# Patient Record
Sex: Male | Born: 1960 | Race: White | Hispanic: No | Marital: Single
Health system: Midwestern US, Community
[De-identification: ages and names within clinical notes are randomized; demographics above are authoritative.]

## PROBLEM LIST (undated history)

## (undated) DIAGNOSIS — J690 Pneumonitis due to inhalation of food and vomit: Secondary | ICD-10-CM

## (undated) DIAGNOSIS — Z800 Family history of malignant neoplasm of digestive organs: Secondary | ICD-10-CM

## (undated) HISTORY — DX: Family history of malignant neoplasm of digestive organs: Z80.0

## (undated) MED ORDER — LISINOPRIL 10 MG OR TABS
ORAL_TABLET | ORAL | Status: AC
Start: 2013-04-02 — End: ?

## (undated) MED ORDER — HYDROCHLOROTHIAZIDE 25 MG OR TABS
ORAL_TABLET | ORAL | Status: DC
Start: 2001-06-02 — End: 2001-12-25

## (undated) MED ORDER — ZESTRIL 40 MG OR TABS
ORAL_TABLET | ORAL | Status: DC
Start: 2001-06-02 — End: 2003-01-04

## (undated) MED ORDER — CIPROFLOXACIN TABS 500 MG OR
ORAL_TABLET | ORAL | Status: DC
Start: 2001-09-08 — End: 2001-10-09

## (undated) MED ORDER — ROBITUSSIN A-C 10-100 MG/5ML OR SYRP
ORAL_SOLUTION | ORAL | Status: AC
Start: 2001-12-19 — End: 2001-12-24

## (undated) MED ORDER — LISINOPRIL 40 MG OR TABS
ORAL_TABLET | ORAL | Status: AC
Start: 2016-01-30 — End: ?

## (undated) MED ORDER — LISINOPRIL 40 MG OR TABS
ORAL_TABLET | ORAL | Status: AC
Start: 2015-10-16 — End: ?

## (undated) MED ORDER — AMOXICILLIN 500 MG OR CAPS
ORAL_CAPSULE | ORAL | Status: AC
Start: 2001-12-19 — End: 2001-12-28

## (undated) MED ORDER — NAPROXEN 500 MG OR TABS
ORAL_TABLET | ORAL | Status: DC
Start: 2001-09-08 — End: 2003-01-04

## (undated) MED ORDER — VENTOLIN 90 MCG/ACT IN AERS
INHALATION_SPRAY | RESPIRATORY_TRACT | Status: DC
Start: 2001-12-19 — End: 2003-01-04

## (undated) MED ORDER — ZESTRIL 40 MG OR TABS
ORAL_TABLET | ORAL | Status: DC
Start: 2001-02-16 — End: 2001-06-02

## (undated) DEATH — deceased

---

## 1994-12-13 DIAGNOSIS — I10 Essential (primary) hypertension: Secondary | ICD-10-CM

## 1994-12-13 HISTORY — DX: Essential (primary) hypertension: I10

## 2001-01-06 ENCOUNTER — Ambulatory Visit (INDEPENDENT_AMBULATORY_CARE_PROVIDER_SITE_OTHER): Payer: No Typology Code available for payment source | Admitting: Internal Medicine

## 2001-01-06 ENCOUNTER — Encounter (INDEPENDENT_AMBULATORY_CARE_PROVIDER_SITE_OTHER): Payer: Self-pay | Admitting: Internal Medicine

## 2001-01-06 VITALS — BP 146/92 | HR 80 | Ht 68.0 in | Wt 220.0 lb

## 2001-01-06 DIAGNOSIS — S335XXA Sprain of ligaments of lumbar spine, initial encounter: Secondary | ICD-10-CM

## 2001-01-06 NOTE — Nursing Note (Signed)
>>   Roger [name], Roger [name]     01/06/2001   1:52 pm  New pt here after falling at work earlier today.

## 2001-01-06 NOTE — Progress Notes (Addendum)
Addended by: Hadassah Pais A on: 01/20/2001,10:42 AM  Modules accepted: Order Summary, Progress Notes    Addended by: Hadassah Pais A on: 01/06/2001,7:39 PM  Modules accepted: Order Summary, Progress Notes    Roger [name] is a 40 year old male who presents for the following concern:    Here for work injury which occurred earlier today. Works at the [employer], was trying to secure a sailboat and slipped and hit his lower and mid back. Did not hit his head though felt somewhat woozy. Just came to get checked out.     Right now has some pain in the R lower back and between the shoulder blades on the R.     PMH: htn  MEDS: prinivil    regular Dr. was at [facility] center, will be transferring care here    past hx: 3-4 years ago did have a back injury, after a twisting injury, saw a physical therapist for a while. That was completely better before today's injury.    ROS: denies radiation of pain, denies weakness  Does feel kind of "fuzzy" in the head but denies focal neurological sx, does not recall hitting his head    PE:  NAD  WDWN male NAD  BP 146/92   Pulse 80   Ht 5\' 8"  (1.766m)   Wt 220 lbs (99.791 kg)  Back: Tender to the R of the lumbar area, along the paraspinal muscles. Tender medial to the R scapula.   Normal strength, reflexes. Skin normal.  gait normal    Assessment and Plan:    Sprain lumbar region (primary encounter diagnosis)  --advised rest, no heavy lifting (off work until next week as noted in letter, as his job is just heavy lifting), ibuprofen prn. Also advised heat to the area with hot tub or shower.    follow-up if not better by next week  completed L&I form. Claim # on patient instructions, attached

## 2001-01-09 ENCOUNTER — Encounter (INDEPENDENT_AMBULATORY_CARE_PROVIDER_SITE_OTHER): Payer: Self-pay | Admitting: Internal Medicine

## 2001-01-09 NOTE — Progress Notes (Signed)
L&I #[id number] mailed with chart notes. lg

## 2001-01-20 ENCOUNTER — Ambulatory Visit (INDEPENDENT_AMBULATORY_CARE_PROVIDER_SITE_OTHER): Payer: No Typology Code available for payment source | Admitting: Internal Medicine

## 2001-01-20 VITALS — BP 160/90 | HR 108 | Wt 210.0 lb

## 2001-01-20 DIAGNOSIS — S335XXA Sprain of ligaments of lumbar spine, initial encounter: Secondary | ICD-10-CM

## 2001-01-20 NOTE — Progress Notes (Addendum)
Addended by: Hadassah Pais A on: 02/01/2001,10:04 AM  Modules accepted: Order Summary, Progress Notes    Addended by: Hadassah Pais A on: 02/01/2001,10:04 AM  Modules accepted: Order Summary    Roger [name] is a 40 year old male who presents for the following concern:    Here for follow-up of back pain, work injury. SEe note from 1/25, note reviewed.     He comes back because the pain persists in the lower back mainly, and in the upper spine between the shoulder blades.   He has been going to work and has been doing his job, but it is really worsening the back pain. He has a lot of pulling in the lumbar spine area.  HE is worried that he will be getting more busy at work but is not sure he can do the work due to his pain; every day after work his back is more sore.    PMH, PSH reviewed and updated as appropriate; see histories tab of EMR for details.    MEDS: occ. ibuprofen for the back pain    ROS: no radiation of the pain; stil lhas pain lumbar area and a little in between the shoulder blades    PE:  tender to palpation L5/S1 area  no skin changes  Neuro exam normal      xray of LS spine: negative    Assessment and Plan:    847.2 SPRAIN LUMBAR REGION; work injury [id number] (primary encounter diagnosis)  Note: with continued pain; will need PT to help loosen muscles and soft tissue, that referral is given to the pt today. completed detailed job restriction form and gave to pt to give to his employer:    he may return to work but needs to refrain from prolonged lifting over 25 lbs, squatting, bending and reaching  Plan: X-RAY LUMBAR SPINE 2 VW, CONSULT TO PHYSICAL    THERAPY       follow-up in about 3 weeks

## 2001-01-20 NOTE — Nursing Note (Signed)
>>   Roger [name]     01/20/2001   10:33 am  Pt returns for recheck of fall injury at work on 01-06-01. Pain   persists, no improvement.    (Pt takes Zestril 20mg (?) qd)

## 2001-02-06 ENCOUNTER — Ambulatory Visit (INDEPENDENT_AMBULATORY_CARE_PROVIDER_SITE_OTHER): Payer: Self-pay | Admitting: Internal Medicine

## 2001-02-07 ENCOUNTER — Encounter (INDEPENDENT_AMBULATORY_CARE_PROVIDER_SITE_OTHER): Payer: Self-pay | Admitting: Internal Medicine

## 2001-02-09 ENCOUNTER — Telehealth (INDEPENDENT_AMBULATORY_CARE_PROVIDER_SITE_OTHER): Payer: Self-pay | Admitting: Internal Medicine

## 2001-02-09 NOTE — Telephone Encounter (Signed)
>>   [facility] VOLKMANN Mon Feb 20, 2001 11:21 AM  Caled and spoke with pt, he had already contacted L&I and will make an appt   with you.    >> [name] [facility] Mon Feb 20, 2001 10:21 AM  Please let him know :  1. to call L&I about charging the time  2. to follow-up with me. Thanks.    >> LINDA GIACOMETTI Fri Feb 10, 2001 8:22 AM  lmtcb    >> [facility] Thu Feb 09, 2001 10:28 AM  Can you help?    >> Bettey Mare Thu Feb 09, 2001 8:32 AM  >> CALL RECEIVED. Contact: pt  Pt states that he has been seeing Dr Virginia Rochester for an L&I on a slip and fall at work, and that he's been out of work for about 5days (not all at once), just a couple of hours here and there, and would like to know how to go about charging that to L&I as opposed to using his sick time at work. Pt would like a call back from the clinic at [phone]

## 2001-02-16 ENCOUNTER — Ambulatory Visit (INDEPENDENT_AMBULATORY_CARE_PROVIDER_SITE_OTHER): Payer: No Typology Code available for payment source | Admitting: Internal Medicine

## 2001-02-16 ENCOUNTER — Other Ambulatory Visit (INDEPENDENT_AMBULATORY_CARE_PROVIDER_SITE_OTHER): Payer: Self-pay | Admitting: Internal Medicine

## 2001-02-16 VITALS — BP 152/88 | HR 92 | Wt 221.0 lb

## 2001-02-16 DIAGNOSIS — S335XXA Sprain of ligaments of lumbar spine, initial encounter: Secondary | ICD-10-CM

## 2001-02-16 NOTE — Progress Notes (Signed)
Roger [name] is a 40 year old male. Patient presents with:      1. back - PT had been working on stretching , now beginning conditioning & is slowly improving, is concerned RE coverage from L&I - now using sick leave to go to PT    There is no previous medical history on file.    There is no previous surgical history on file.    Patient Active Problem List:    SPRAIN LUMBAR REGION; work injury [id number][478.2]      Current prescriptions:  ZESTRIL TABS 40 MG OR Take 1 tablet by mouth every day      Patient's family history  None on file       Tobacco Use: Not Asked    Alcohol Use: Not Asked       Allergies:No Known Drug Allergies, Hydrochlorothiazide, Atenolol      ROS:  see the review of system listed in the HPI  Current social issues & past medical history reviewed with patient, see EMR.    Exam:    Gen: healthy, alert, no distress  BACK: symmetric, straight - no evidence of scoliosis, + lumbar vertebral tenderness to palpation & percussion,   Exam otherwise deferred.    IMPRESSION/PLAN:    847.2 SPRAIN LUMBAR REGION; work injury [id number] (primary encounter diagnosis)  Note: slow improvement with PT  Plan: continue current Rx, f/u pcp    Hughie Closs MD.

## 2001-02-16 NOTE — Telephone Encounter (Signed)
>>   RUTHERFORD P HAYES Thu Feb 16, 2001 5:28 PM  >> CALL RECEIVED. Contact:

## 2001-02-16 NOTE — Nursing Note (Signed)
>>   Roger [name]     02/16/2001   4:46 pm  Pt here for f/u on back injury. Meds:zestril.

## 2001-02-28 ENCOUNTER — Telehealth (INDEPENDENT_AMBULATORY_CARE_PROVIDER_SITE_OTHER): Payer: Self-pay | Admitting: Internal Medicine

## 2001-02-28 NOTE — Telephone Encounter (Signed)
>>   [facility] VOLKMANN Thu Mar 02, 2001 6:06 PM  Called and advised pt.    >> [name] [facility] Thu Mar 02, 2001 1:40 PM  Please have him bring by the paperwork he has for me to fill out, thanks.    >> CHAU TRAN Tue Feb 28, 2001 5:29 PM  Pts informed needs to be reeval for his L & I, next appt. available with   Dr. Virginia Rochester wouldn't be availabe until 03/06/01. Pt said he has a letter   requested by her L & I for the doctor to fill out. Pt said only done 6   times PT due to no time for PT. Pt 's L & I will close his case if not   getting his paper work by then. Pls advise.    >> Hulan Fess Tue Feb 28, 2001 9:52 AM  >> CALL RECEIVED. Contact: pt  [place] pt  Pt states he needs a letter from Dr. Virginia Rochester stating he needs to continue physical therapy. Pt states L&I is requesting this letter by 03-10-01 or they will close his claim. Pt has a follow up appt on 03-06-01. Pt may be reached at [phone] (home) if any questions.

## 2001-03-06 ENCOUNTER — Ambulatory Visit (INDEPENDENT_AMBULATORY_CARE_PROVIDER_SITE_OTHER): Payer: No Typology Code available for payment source | Admitting: Internal Medicine

## 2001-03-06 VITALS — BP 148/94 | HR 80 | Wt 212.0 lb

## 2001-03-06 DIAGNOSIS — S335XXA Sprain of ligaments of lumbar spine, initial encounter: Secondary | ICD-10-CM

## 2001-03-06 NOTE — Progress Notes (Addendum)
Addended by: Hadassah Pais A on: 03/09/2001,5:20 PM  Modules accepted: Order Summary, Progress Notes    Roger [name] is a 40 year old male who presents for the following concern:    Here for follow-up of low back pain, work injury. 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 has been going to PT at[facility] [place]. He feels this is really helpful but he is not better yet, and he would like to extend his PT rx for a while. L&I said they were going to close his claim 3/29 unless we extended that    HE still has some pain in the lower back and can't lift or bend like before the accident. No severe shooting pain or bowel/bladder dysfunction.      Patient Active Problem List:    SPRAIN LUMBAR REGION; work injury [id number][956.3]   Date Noted: 01/20/2001      PMH, PSH reviewed and updated as appropriate; see histories tab of EMR for details.    Current prescriptions:  ZESTRIL TABS 40 MG OR Take 1 tablet by mouth every day      PE:  robust male NAD  ROM of his back is somewhat limited due to pain    Assessment and Plan:    847.2 SPRAIN LUMBAR REGION; work injury [id number] (primary encounter diagnosis)  Note: extend PT 12 more visits, wrote RX for that. See work instructions written out, and gave pt a copy of the original form we did to bring to his work again  Plan:     He will call me in 1-2 months with a progress report,and will return if things worsen or don't get better

## 2001-03-06 NOTE — Nursing Note (Signed)
>>   Roger [name], Roger [name]     03/06/2001   11:15 am  Pt returns to f/u PT for back injury. DOI: 01/06/01 L&I [id number]  Update paperwork to extend claim from closing on 03-10-01.

## 2001-04-12 ENCOUNTER — Ambulatory Visit (INDEPENDENT_AMBULATORY_CARE_PROVIDER_SITE_OTHER): Payer: No Typology Code available for payment source | Admitting: Internal Medicine

## 2001-04-12 ENCOUNTER — Encounter (INDEPENDENT_AMBULATORY_CARE_PROVIDER_SITE_OTHER): Payer: Self-pay | Admitting: Internal Medicine

## 2001-04-12 VITALS — BP 146/106 | HR 72 | Wt 216.0 lb

## 2001-04-12 DIAGNOSIS — S335XXA Sprain of ligaments of lumbar spine, initial encounter: Secondary | ICD-10-CM

## 2001-04-12 NOTE — Progress Notes (Signed)
Roger [name] is a 40 year old male who presents for the following concern:    Here for follow-up of back pain and PT. Work injury.  Has been going to physical therapy, starting to do load tests. Has done 6 visits from the first 12 (lost 6 of that 12 due to administrative problems); and then got another 12 visits of which he has used 9.  HE is going to PT 2X/week, would like to go 3X but is very busy at work. He is still at light duty at work, though is starting to do more.    He is requesting more PT visits, as he really feels he is making progress with strengthening of his back muscles and decreasing his pain. I have reviewed the recent PT progress report; please see scanned report for details.    MEDS: o cc. NSAID, but none regular    ROS: no radiation of pain, still has some aching in his lower back    PE:  gait WNL  back rom is WNL      Assessment and Plan:    847.2 SPRAIN LUMBAR REGION; work injury [id number] (primary encounter diagnosis)  Note: improving lumbar sprain, really starting to make some progress in PT , but still needs strengthening to help prevent re-injury. Did new referral to PT and I believe he should continue for another 8-12 visits  Plan: CONSULT TO PHYSICAL THERAPY       follow-up at the end of that, to see if it is time to close the claim if his condition is fixed and stable at that time

## 2001-04-12 NOTE — Nursing Note (Signed)
>>   [name], [name]     04/12/2001   1:35 pm  Pt here to f/u PT for back pain.

## 2001-05-13 ENCOUNTER — Other Ambulatory Visit (INDEPENDENT_AMBULATORY_CARE_PROVIDER_SITE_OTHER): Payer: No Typology Code available for payment source | Admitting: Internal Medicine

## 2001-05-13 DIAGNOSIS — S335XXA Sprain of ligaments of lumbar spine, initial encounter: Secondary | ICD-10-CM

## 2001-05-27 ENCOUNTER — Ambulatory Visit (INDEPENDENT_AMBULATORY_CARE_PROVIDER_SITE_OTHER): Payer: Self-pay | Admitting: Internal Medicine

## 2001-05-29 ENCOUNTER — Ambulatory Visit (INDEPENDENT_AMBULATORY_CARE_PROVIDER_SITE_OTHER): Payer: Self-pay | Admitting: Internal Medicine

## 2001-05-31 ENCOUNTER — Ambulatory Visit (INDEPENDENT_AMBULATORY_CARE_PROVIDER_SITE_OTHER): Payer: Self-pay | Admitting: Internal Medicine

## 2001-06-02 ENCOUNTER — Ambulatory Visit (INDEPENDENT_AMBULATORY_CARE_PROVIDER_SITE_OTHER): Payer: No Typology Code available for payment source | Admitting: Internal Medicine

## 2001-06-02 ENCOUNTER — Ambulatory Visit (INDEPENDENT_AMBULATORY_CARE_PROVIDER_SITE_OTHER): Payer: Self-pay | Admitting: Internal Medicine

## 2001-06-02 ENCOUNTER — Encounter (INDEPENDENT_AMBULATORY_CARE_PROVIDER_SITE_OTHER): Payer: Self-pay | Admitting: Internal Medicine

## 2001-06-02 VITALS — BP 140/100 | HR 78 | Wt 203.0 lb

## 2001-06-02 DIAGNOSIS — I10 Essential (primary) hypertension: Secondary | ICD-10-CM

## 2001-06-02 DIAGNOSIS — R7402 Elevation of levels of lactic acid dehydrogenase (LDH): Secondary | ICD-10-CM

## 2001-06-02 LAB — CHOLESTEROL (TOTAL & HDL)
Cholesterol/HDL Ratio: 3.6
HDL Cholesterol: 54 mg/dL (ref >40)
Total Cholesterol: 196 mg/dL (ref <200)

## 2001-06-02 LAB — COMPREHENSIVE METABOLIC PANEL
ALT (GPT): 56 U/L (ref 12–79)
AST (GOT): 49 U/L — ABNORMAL HIGH (ref 10–44)
Albumin: 4.4 g/dL (ref 3.5–5.2)
Alkaline Phosphatase (Total): 46 U/L (ref 36–122)
Anion Gap: 8 (ref 5–15)
Bilirubin (Total): 1.2 mg/dL — ABNORMAL HIGH (ref 0.1–1.0)
Calcium: 10.8 mg/dL — ABNORMAL HIGH (ref 8.9–10.2)
Carbon Dioxide, Total: 28 mEq/L (ref 22–32)
Chloride: 105 mEq/L (ref 98–108)
Creatinine: 1 mg/dL (ref 0.3–1.2)
Glucose: 86 mg/dL (ref 62–125)
Potassium: 4.9 mEq/L (ref 3.7–5.2)
Protein (Total): 7.6 g/dL (ref 6.0–8.2)
Sodium: 141 mEq/L (ref 136–145)
Urea Nitrogen: 11 mg/dL (ref 8–21)

## 2001-06-02 NOTE — Nursing Note (Signed)
>>   TRAN, CHAU T     06/02/2001   12:18 pm  Pt presents today for f/u on Blood pressure and PT f/u.

## 2001-06-02 NOTE — Progress Notes (Signed)
Roger [name] is a 40 year old male. Patient presents with:    Follow-Up     History of Present Illness: hypertension, was on 3 medications in the past. Zestril and hctz and atenolol in the past. Has been out of medications for a while. would like to start back on medications, had erectile dysfunction in the past. Reviewed that this can happen with any of the medications.       Review of patient's past medical history indicates:   HYPERTENSION NOS 1996    Comment: on 3 drugs, some side effects from medications.    Review of patient's past surgical history indicates:      Comment: none    L&I improvement in symtopms but still sore. would like to know report reviewed recent PT result with him which recommended he come more often, and when he is there, he is improving remarkably.    Patient Active Problem List:    SPRAIN LUMBAR REGION; work injury [id number][478.2]   Date Noted: 01/20/2001      Social History   Marital Status: Married Spouse Name:    Years of Education:  Number of children:     Social History Main Topics   Tobacco Use: Never    Alcohol Use: Yes 6 oz/week   Comment: 6 beers/week    Drug Use: No    Sexually Active: Yes Partners with: Male    Other Topics Concern   None on file    Social History Narrative   Date info entered: 06/02/2001   Living situation: lives w/ wife and children    Job/school: work for the [employer]   Diet: regular American    Exercise: since starting PT, yes   [employer] iwth son, camping on occasion, coaching little [employer]   Recent life changes:    Church or spiritual support: yes            Review of patient's family history indicates:   Hypertension  Mother    Comment: stroke    ROS:  Constitutional: weight loss, not a loud snorer  Eyes: water a bit  ENT/mouth:   Cardiovascular: hypertension   Respiratory: neg  Gastrointestinal: neg  GU/GYNE: see hpi  Musculoskeletal: neg  Skin/breast: neg  Neuro: netg  Psych: neg  Endo: neg  Hematologic/Lymphatic: neg  Allergic:  neg    Exam:    Gen: healthy, alert, no distress    BP reviewed and confirmed. See my bp    HEENT: Sclerae and conjunctivae clear. External ears and canals clear. TM's normal bilaterally. Oropharynx normalcrowded with normal tongue and low lying palate. Neck without palpable adenopathy. No thyroid enlargement or discrete nodule noted at this time. Carotids 2+ without bruits.    LUNGS: Clear to auscultation, no evidence of consolidation or wheezing.    Heart: Regular rate and rhythm. Normal PMI. NL S1 and S2.    ABDOMEN: The abdomen is normal in appearance. Bowel sounds are normal and there is no tenderness to palpation. No organomegaly is present.    EXTREMITIES: no CCE, skin changes, nail changes  Roger [name] is a 40 year old with:    401.9 HYPERTENSION NOS (primary encounter diagnosis)  Note: recommend restarting zestril and htz, get labs today. May have urologic dysfunction after starting, may benefit from vigra.       Plan: ZESTRIL 40 MG OR TABS, HYDROCHLOROTHIAZIDE 25    MG OR TABS, COMPREHENSIVE METABOLIC PANEL,    CHOLESTEROL (TOTAL & HDL)   The patient  indicates understanding of these issues and agrees with the plan. Check w/ Dr. Virginia Rochester in 4 weeks, sooner if sympotms.

## 2001-06-09 ENCOUNTER — Telehealth (INDEPENDENT_AMBULATORY_CARE_PROVIDER_SITE_OTHER): Payer: Self-pay | Admitting: Internal Medicine

## 2001-06-09 NOTE — Telephone Encounter (Signed)
>>   [facility] A VOLKMANN Fri Jun 09, 2001 4:18 PM  >> CALL RECEIVED. Contact:   FAxed PT referral to below #.    >> MARY H CULLITON Fri Jun 09, 2001 4:05 PM  Pt of Dr. Ashok Norris w/[facility] states that pt was referred there by Dr. Virginia Rochester yet they have not received the referral yet. The referral needs to go to [phone]. Lennox Laity can be reached at [phone].

## 2001-06-11 DIAGNOSIS — R7402 Elevation of levels of lactic acid dehydrogenase (LDH): Secondary | ICD-10-CM | POA: Insufficient documentation

## 2001-07-14 ENCOUNTER — Ambulatory Visit (INDEPENDENT_AMBULATORY_CARE_PROVIDER_SITE_OTHER): Payer: Self-pay | Admitting: Internal Medicine

## 2001-07-14 ENCOUNTER — Telehealth (INDEPENDENT_AMBULATORY_CARE_PROVIDER_SITE_OTHER): Payer: Self-pay | Admitting: Internal Medicine

## 2001-07-14 NOTE — Telephone Encounter (Signed)
>>   [name] Mon Jul 17, 2001 8:51 AM  [facility], advised pt's should have 6 months refills left.    >> [name] Fri Jul 14, 2001 3:31 PM  >> CALL RECEIVED. Contact:       >> [name] Fri Jul 14, 2001 10:51 AM  [place] Pt.    REFILL CALL  ------------  DOB: 10/27/1961  Medication(s) requested:Zefril  How many days of meds. left? 0  Pharmacy name and location:[facility] on [address]  Pharmacy phone #:on file  Written prescription needed? NO  Caller's first and last name:Roger [name]  Caller's relation to pt: SELF  Tel. # to reach caller: [phone] (work) or [phone] (home) after 2pm.  Okay leave VM or msg with someone?YES    done      TSR - Please inform patient that refills typically take 24-48 hours and to call their pharmacy to verify status.

## 2001-07-15 ENCOUNTER — Ambulatory Visit (INDEPENDENT_AMBULATORY_CARE_PROVIDER_SITE_OTHER): Payer: Self-pay | Admitting: Internal Medicine

## 2001-08-17 ENCOUNTER — Telehealth (INDEPENDENT_AMBULATORY_CARE_PROVIDER_SITE_OTHER): Payer: Self-pay | Admitting: Internal Medicine

## 2001-08-17 NOTE — Telephone Encounter (Signed)
>>   MAI H VU-LUU Thu Aug 17, 2001 9:52 AM  >> CALL RECEIVED. Contact:   notified pharm pt still has 6 refill left, ok to refill.    >> Charlsie Merles Thu Aug 17, 2001 9:24 AM  Dr Virginia Rochester Pt  REFILL CALL  ------------  DOB: 02/23/1961  Medication(s) requested:Lisinopril  How many days of meds. left? 0  Pharmacy name and location:[facility] [address]  Pharmacy phone #:[phone]  Written prescription needed? NO  Caller's first and last name:Roger [name]  Caller's relation to pt: SELF  Tel. # to reach [phone] (work)  Okay leave [employer] or msg with someone?YES    TSR (done)- Please inform patient that refills typically take 24-48 hours and to call their pharmacy to verify status.

## 2001-09-04 ENCOUNTER — Ambulatory Visit (INDEPENDENT_AMBULATORY_CARE_PROVIDER_SITE_OTHER): Payer: Self-pay | Admitting: Internal Medicine

## 2001-09-08 ENCOUNTER — Ambulatory Visit (INDEPENDENT_AMBULATORY_CARE_PROVIDER_SITE_OTHER): Payer: No Typology Code available for payment source | Admitting: Internal Medicine

## 2001-09-08 ENCOUNTER — Other Ambulatory Visit (INDEPENDENT_AMBULATORY_CARE_PROVIDER_SITE_OTHER): Payer: No Typology Code available for payment source | Admitting: Internal Medicine

## 2001-09-08 VITALS — BP 130/100 | HR 72 | Wt 198.0 lb

## 2001-09-08 LAB — HEPATIC FUNCTION PANEL
ALT (GPT): 60 U/L (ref 12–79)
AST (GOT): 31 U/L (ref 10–44)
Albumin: 4.5 g/dL (ref 3.5–5.2)
Alkaline Phosphatase (Total): 43 U/L (ref 36–122)
Bilirubin (Direct): 0.1 mg/dL (ref 0.0–0.3)
Bilirubin (Total): 0.7 mg/dL (ref 0.1–1.0)
Protein (Total): 7.9 g/dL (ref 6.0–8.2)

## 2001-09-08 LAB — PR U/A AUTO DIPSTICK ONLY, ONSITE
Bilirubin (Indirect): NEGATIVE
Glucose: NEGATIVE mg/dL
Ketones, URN: NEGATIVE mg/dL
Leukocytes: NEGATIVE
Nitrite, URN: NEGATIVE
Occult Blood, URN: NEGATIVE
Protein: NEGATIVE mg/dL
Specific Gravity, URN: 1.02 (ref 1.005–1.030)
Urobilinogen, URN: NORMAL E.U./dL
pH, Whole Blood: 6.5 (ref 5.0–8.0)

## 2001-09-08 NOTE — Progress Notes (Signed)
Roger [name] is a 40 year old male. Patient presents with:    groin pain      History of Present Illness:     Underneath his right testicle it is sore. Is urinating several times throughout the night. Is monogamous. No penile discharge. No family h/o prostate issues. Thinks he may have pulled something in the area.  Felt a pull when pushing something. Denies any h/o std. Denies dysuria or hematuria.     Has a pain on his right hip that is waking him up at night. Was playing basketball last spring and felt a pull and could not jump any longer. Only hurts at night, aches.     Review of patient's past medical history indicates:   HYPERTENSION NOS 1996    Comment: on 3 drugs, some side effects from medications.    Review of patient's past surgical history indicates:       Comment: none    Patient Active Problem List:    SPRAIN LUMBAR REGION; work injury [id number][086.5]   HYPERTENSION NOS[401.9]   HYPERCALCEMIA[275.42]   ELEV TRANSAMINASE/LDH[790.4]      Current prescriptions:  CIPROFLOXACIN TABS 500 MG OR Take 1 tablet 2 times per day until gone  NAPROXEN 500 MG OR TABS Take 1 tablet by mouth 2 times per day as needed for pain  ZESTRIL 40 MG OR TABS Take 1 tablet by mouth every day  HYDROCHLOROTHIAZIDE 25 MG OR TABS Take 1 tablet by mouth every day      Review of patient's family history indicates:   Hypertension  Mother    Comment: stroke       Tobacco Use: Never    Alcohol Use: Yes  6 oz/week   Comment: 6 beers/week       Allergies:No Known Drug Allergies, Atenolol      ROS:  no fever, nausea, vomiting  denies cardiac/pulmonary sxs.    Exam:    Gen: healthy, alert, no distress  BP 130/100   Pulse 72   Wt 198 lbs (89.812 kg)  genital: no testicular masses/tenderness.  prostate: tender to palpation, mildly enlarged.   Hip: right hip with from. points to tender area being over bursae.   Exam otherwise deferred.    IMPRESSION/PLAN:    601.9 PROSTATITIS NOS (primary encounter diagnosis)  Note: likely a mild  prostatitis  Plan: U/A AUTO DIPSTICK ONLY   treat with abx and f/u in two weeks with Dr. Virginia Rochester or myself. if not resolved, will do referral to urology.     727.3 BURSITIS NEC  Note: most likely diagnosis  Plan: as he has h/o htn, will use nsaid at night only. if not improving over next few weeks, will refer to sports medicine.        275.42 HYPERCALCEMIA  Note: reminded patient that he is over due for labs.   Plan: lab check and f/u with Dr. Virginia Rochester or myself.

## 2001-09-12 LAB — PARATHYROID HORMONE W/CALCIUM
Calcium: 10.7 mg/dL — ABNORMAL HIGH (ref 8.9–10.2)
Parathyroid Hormone: 20 pg/mL (ref 7–53)

## 2001-09-19 ENCOUNTER — Telehealth (INDEPENDENT_AMBULATORY_CARE_PROVIDER_SITE_OTHER): Payer: Self-pay | Admitting: Internal Medicine

## 2001-09-19 NOTE — Telephone Encounter (Signed)
>>   [name] Tue Sep 19, 2001 9:06 AM  >> CALL RECEIVED. Contact:   Pt's informd to check w/ pahramcy, should have refill left.    >> Nehemiah Massed SNOW Tue Sep 19, 2001 8:18 AM  Dr. Virginia Rochester pt,    Pt declined to contact his pharmacy first.  REFILL CALL  ------------  DOB: Jun 10, 1961   Medication(s) requested:Lisinopril  How many days of meds. left? 1  Pharmacy name and location:[facility] on [address]  Pharmacy phone #[phone]  Written prescription needed? NO  Caller's first and last name:Roger [name]   Caller's relation to pt: SELF  Tel. # to reach caller: [phone] (home)   Okay leave VM or msg with someone?YES  done  TSR - Please inform patient that refills typically take 24-48 hours and to call their pharmacy to verify status.

## 2001-10-06 ENCOUNTER — Telehealth (INDEPENDENT_AMBULATORY_CARE_PROVIDER_SITE_OTHER): Payer: Self-pay | Admitting: Internal Medicine

## 2001-10-06 NOTE — Telephone Encounter (Signed)
>>   [facility] A VOLKMANN Fri Oct 06, 2001 1:36 PM  CAlled and advised pt.    >> [name] [facility] Fri Oct 06, 2001 1:19 PM  please have him make an appt to discuss possible medication changes, thanks.    >> [name] Fri Oct 06, 2001 11:41 AM  Pls advise.    >> MAISHA HOUSTON-BECKNELL Fri Oct 06, 2001 10:56 AM  Pt of Dr Despreaux     Pt requesting to speak to Dr Virginia Rochester regarding side effects of BP medication. Pt states it causes him to feel queasy and he is unable to keep a full erection.     REFILL CALL  ------------  DOB: Feb 05, 1961  Medication(s) requested:Viagra  How many days of meds. left? 0  Pharmacy name and location:[facility]  Pharmacy phone #:[phone]  Written prescription needed? NO  Caller's first and last name:Mithcell E Clearman  Caller's relation to pt: SELF  Tel. # to reach caller: [phone] (cell)  Okay leave VM or msg with someone?YES    TSR - Please inform patient that refills typically take 24-48 hours and to call their pharmacy to verify status.  Done

## 2001-10-09 ENCOUNTER — Ambulatory Visit (INDEPENDENT_AMBULATORY_CARE_PROVIDER_SITE_OTHER): Payer: No Typology Code available for payment source | Admitting: Internal Medicine

## 2001-10-09 VITALS — BP 130/84 | HR 88 | Wt 199.0 lb

## 2001-10-09 MED ORDER — VIAGRA 100 MG OR TABS
ORAL_TABLET | ORAL | Status: DC
Start: 2001-10-09 — End: 2003-01-04

## 2001-10-09 NOTE — Nursing Note (Signed)
>>   Roger [name], Roger [name]              10/09/2001 11:16 am  Pt worked into schedule to discuss possible side effects from HCTZ.

## 2001-10-09 NOTE — Progress Notes (Signed)
Roger [name] is a 40 year old male who presents for the following concern:    Here to discuss BP medications. Has been on HCTZ and zestril for Blood pressure, but had erectile dysfunction with that. He tried stopping the HCTZ and the problem got better, but his BP was running high so he restarted it.    In the past he also was on atenolol but had erectile dysfunction on that.    He woul dlike to try viagra in combo with his current BP medication.    Patient Active Problem List:    SPRAIN LUMBAR REGION; work injury [id number][454.0]   Date Noted: 01/20/2001   HYPERTENSION NOS[401.9]   Date Noted: 06/02/2001   HYPERCALCEMIA[275.42]   Date Noted: 06/11/2001   ELEV TRANSAMINASE/LDH[790.4]   Date Noted: 06/11/2001      PMH, PSH reviewed and updated as appropriate; see histories tab of EMR for details.    Current prescriptions:  NAPROXEN 500 MG OR TABS Take 1 tablet by mouth 2 times per day as needed for pain  ZESTRIL 40 MG OR TABS Take 1 tablet by mouth every day  HYDROCHLOROTHIAZIDE 25 MG OR TABS Take 1 tablet by mouth every day    ROS:  as above  was feeling lightheaded when on atenolol and woozy  feels OK on current combo of medications    PE"  BP 130/84   Pulse 88   Wt 199 lbs (90.266 kg)  BP repeated and confirmed  Ext: no edema      Assessment and Plan:    401.9 HYPERTENSION NOS (primary encounter diagnosis)  Note: discussed two options today:  1. try to get a different combo of meds to see if the ED will not be as bad  or   2. trial of viagra      Plan: VIAGRA 100 MG OR TABS   pt elected to trial of viagra. REviewed patient instructions on same and printed out for him. 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

## 2001-10-11 ENCOUNTER — Encounter (INDEPENDENT_AMBULATORY_CARE_PROVIDER_SITE_OTHER): Payer: Self-pay | Admitting: Cardiovascular Disease

## 2001-12-19 ENCOUNTER — Ambulatory Visit (INDEPENDENT_AMBULATORY_CARE_PROVIDER_SITE_OTHER): Payer: No Typology Code available for payment source | Admitting: Internal Medicine

## 2001-12-19 NOTE — Progress Notes (Signed)
Roger [name] is a 41 year old male. Patient presents with:    URI      History of Present Illness: 1 1/2 weeks of cough with green sputum. No asthma, possibly slight wheezing at night. He has sinus pain with green runny nose. No chest pain, no shortness of breath. Never smoked. He has a sore throat. Good PO.    Review of patient's past medical history indicates:   HYPERTENSION NOS  1996    Comment: on 3 drugs, some side effects from medications.    Review of patient's past surgical history indicates:     Comment: none    Patient Active Problem List:    SPRAIN LUMBAR REGION; work injury [id number][846.9]   HYPERTENSION NOS[401.9]   HYPERCALCEMIA[275.42]   ELEV TRANSAMINASE/LDH[790.4]      Current prescriptions:  VIAGRA 100 MG OR TABS Take 1/2-1 tablet 1 hour before sexual activity, not to exceed 1 tablet per day  NAPROXEN 500 MG OR TABS Take 1 tablet by mouth 2 times per day as needed for pain  ZESTRIL 40 MG OR TABS Take 1 tablet by mouth every day  HYDROCHLOROTHIAZIDE 25 MG OR TABS Take 1 tablet by mouth every day      Review of patient's family history indicates:   Hypertension Mother    Comment: stroke       Tobacco Use: Never    Alcohol Use: Yes 6 oz/week   Comment: 6 beers/week       Allergies:No Known Drug Allergies  , Atenolol      GEX:BMWUX    Exam:    Gen: healthy, alert, no distress  Blood pressure 130/90, pulse 76, temperature 99.2, weight 197 lbs (89.359 kg).  HEENT: Sclerae and conjunctivae clear. External ears and canals clear. TM's normal bilaterally. Nose normal without lesions or discharge. Gums and dentition normal. Sinus tenderness is appreciated. Oropharynx normal with normal tongue and palate. Neck supple.  LUNGS: The lungs are clear to auscultation and percussion. Normal inspiratory excursion. No wheezing, even with full forced exhalation.  COR: RRR S1S2    Exam otherwise deferred.    IMPRESSION/PLAN:  1-Likely sinusitis and bronchitis-Amoxicillin 500 q8, continue sudafed. No noted wheezing, even  with full forced exhalation. Gave ventolin. Advised if still wheezing-needs prompt follow-up. Advised follow-up at rregular appointment, sooner if not better in 1 week-promptly if worse.

## 2001-12-19 NOTE — Nursing Note (Signed)
>>   Roger [name], Roger [name]              12/19/2001 9:45 am  Pt here c/o cold sx x1wk

## 2001-12-25 ENCOUNTER — Telehealth (INDEPENDENT_AMBULATORY_CARE_PROVIDER_SITE_OTHER): Payer: Self-pay | Admitting: Internal Medicine

## 2001-12-25 MED ORDER — HYDROCHLOROTHIAZIDE 25 MG OR TABS
ORAL_TABLET | ORAL | Status: DC
Start: 2001-12-25 — End: 2003-01-04

## 2001-12-25 NOTE — Telephone Encounter (Signed)
>>   [name] Mon Dec 25, 2001 3:23 PM  Approved Prescriptions: Disp Refills   HYDROCHLOROTHIAZIDE 25 MG OR TABS 30 6    Sig: Take 1 tablet by mouth every day    Ordering User: Virginia Rochester, MICHELE A    Above meds called to [facility].    >> [facility] A VOLKMANN Mon Dec 25, 2001 2:49 PM  1-Name of requested medication: HCTZ  2-Class of medication requested: Diuretic  3-Date last refilled: 11/20/01  4-Date of last office visit: 12/19/01  5-Next office visit (scheduled or expected): none    (Please note: If dose requested is different from that in chart or medication is not in the patient's profile, answer protocol questions, then forward to the provider)    6-Is there a protocol for this medication? No, Is this a short term medication? Yes, What are the symptoms for which they are requesting the med now: blood pressure, forward to provider.

## 2002-04-25 ENCOUNTER — Telehealth (INDEPENDENT_AMBULATORY_CARE_PROVIDER_SITE_OTHER): Payer: Self-pay | Admitting: Internal Medicine

## 2002-04-25 ENCOUNTER — Ambulatory Visit (INDEPENDENT_AMBULATORY_CARE_PROVIDER_SITE_OTHER): Payer: No Typology Code available for payment source | Admitting: Medical Oncology

## 2002-04-25 VITALS — BP 146/88 | HR 72 | Wt 200.0 lb

## 2002-04-25 NOTE — Progress Notes (Signed)
ID: Roger [name] is a 41 y/o gentleman comes in w/ family hx of colon CA.    HPI: Pt's 71 y/o brother was recentely dx'd w/ colon CA, metastatic to liver, and pt wants to get colonoscopy. No blood in stool. No change in stool caliber. No recent wt loss. No other family hx of cancer.     PMH: HTN    PSH: none    Meds: lisinopril 40 mg qd    All: atenolol?    SH: no tobacco, 2 cans of beer/day, no IVDU   married w/ 2 children    FH: father died of an accident, mother(58) w/ DM and CVA.   5 brothers and 2 sisters - healthy except 1 brother w/ colon CA.    PE:  Gen: NAD  Neck: supple, no LAD  Lungs: CTA bilat  CV: RRR, no m  Abd: soft, ND, NT  Ext: no edema    A/P: 41 y/o gentleman comes in w/ family hx of colon CA.    Strong family hx of colon CA - early screening colonoscopy is warranted. Will refer pt to [facility] GI clinic.  - to [facility] for colonoscopy.  - HTN not optimally controlled. will discuss in the next visit. Consider adding Ca channel blocker since pt w/ poor tolerance to diuretics and beta blockers.    Hoyle Sauer, [facility]      ATTENDING NOTE BY Hadassah Pais, MD  Patient seen, interviewed and examined.  Agree with assessment and plans as outlined above.

## 2002-04-25 NOTE — Telephone Encounter (Signed)
>>   [name] Roger [name] Wed Apr 25, 2002 8:28 AM  will see pt this afternoon    >> Roger [name] Wed Apr 25, 2002 8:28 AM  Pt of Melodye Ped, MD.  TRIAGE/ADVICE  -----------  Main reason for calling is: pain in or under scrotum  How long has this episode lasted: seen previously for this in Sept 2002, but now has a more recent concern. Pt has appt at 2:30pm today with Dr Willaim Bane.  Preferred phone number: [phone] (work)  Okay to leave VM or msg with someone? YES, discretely

## 2002-04-25 NOTE — Nursing Note (Signed)
>>   Roger [name], Roger [name]                04/25/2002 2:47 pm  Pt here to discuss colonoscopy.    Also still having right hip pain.

## 2002-05-14 ENCOUNTER — Other Ambulatory Visit (INDEPENDENT_AMBULATORY_CARE_PROVIDER_SITE_OTHER): Payer: No Typology Code available for payment source | Admitting: Internal Medicine

## 2002-05-15 LAB — PR OCCULT BLOOD, FECES, ONSITE
Occult Blood (1), Stool: NEGATIVE
Occult Blood (2), Stool: NEGATIVE
Occult Blood (3), Stool: NEGATIVE

## 2002-05-21 ENCOUNTER — Telehealth (INDEPENDENT_AMBULATORY_CARE_PROVIDER_SITE_OTHER): Payer: Self-pay | Admitting: Internal Medicine

## 2002-05-21 MED ORDER — LISINOPRIL 40 MG OR TABS
ORAL_TABLET | ORAL | Status: DC
Start: 2002-05-21 — End: 2003-01-04

## 2002-05-21 NOTE — Telephone Encounter (Signed)
>>   [name] [facility] Mon May 21, 2002 3:17 PM  approved and faxed    >> [facility] Mon May 21, 2002 9:25 AM  Pharmacy requesting refill for Lisinipril. LAst OV 04/25/02.

## 2002-06-07 ENCOUNTER — Telehealth (INDEPENDENT_AMBULATORY_CARE_PROVIDER_SITE_OTHER): Payer: Self-pay | Admitting: Internal Medicine

## 2002-06-07 NOTE — Telephone Encounter (Signed)
>>   Roger [name] Thu Jun 07, 2002 6:31 PM  pt's informed hemoccults came out to be negative.    >> Roger [name] Thu Jun 07, 2002 4:01 PM  Pt of Dr Melodye Ped, MD    RESULTS CALL  -------------  Results for what test(s)?Other Stool  Where was the test done?: [facility]  When was test(s) done?: 05-08-02  Preferred phone number: [phone] (home)   Okay to leave VM or msg with someone?YES

## 2002-06-12 ENCOUNTER — Ambulatory Visit (HOSPITAL_BASED_OUTPATIENT_CLINIC_OR_DEPARTMENT_OTHER): Payer: No Typology Code available for payment source

## 2002-12-04 ENCOUNTER — Telehealth (INDEPENDENT_AMBULATORY_CARE_PROVIDER_SITE_OTHER): Payer: Self-pay | Admitting: Internal Medicine

## 2002-12-04 NOTE — Telephone Encounter (Signed)
>>   [facility] THOEUN Wed Dec 05, 2002 8:49 AM  Approved Prescriptions: Disp Refills   VIAGRA 100 MG OR TABS 10 5    Sig: Take 1/2-1 tablet 1 hour before sexual activity,    not to exceed 1 tablet per day   Authorizing Provider: Hadassah Pais A   Ordering User: Cherre Huger    Confirmed fax to pharmacy.    >> RUTHERFORD P HAYES Wed Dec 05, 2002 6:52 AM  ok to refill medication as reqested & please close encounter when complete.    >> LINDA A WEGSTEEN Tue Dec 04, 2002 11:59 AM  Pt. requesting refill  1-Name of requested medication: viagra  2-Class of medication requested:   3-Date last refilled: 10/09/01 + 5 refills  4-Date of last office visit: 04/25/02  5-Next office visit (scheduled or expected): none scheduled          >> MARY H CULLITON Tue Dec 04, 2002 11:04 AM  Pt of Dr. Virginia Rochester     REFILL CALL      Medication(s) requested:Viagra   Dose taking now: unk  Reason for request: Pt states he lost the bottle w/the Rx number on it  Written prescription needed? NO  Pharmacy name [facility]  Pharmacy location:[address] in [place]  Pharmacy phone #[phone]  Preferred phone #: [phone] (home)   Okay leave VM or msg with someone?YES    TSR - Please inform patient that refills typically take 1-2 business days and to call their pharmacy to verify status. done

## 2002-12-05 MED ORDER — VIAGRA 100 MG OR TABS
ORAL_TABLET | ORAL | Status: DC
Start: 2002-12-04 — End: 2003-01-04

## 2003-01-02 ENCOUNTER — Ambulatory Visit (INDEPENDENT_AMBULATORY_CARE_PROVIDER_SITE_OTHER): Payer: Self-pay | Admitting: Internal Medicine

## 2003-01-04 ENCOUNTER — Ambulatory Visit (INDEPENDENT_AMBULATORY_CARE_PROVIDER_SITE_OTHER): Payer: No Typology Code available for payment source | Admitting: Internal Medicine

## 2003-01-04 VITALS — BP 168/108 | HR 104 | Wt 212.0 lb

## 2003-01-04 LAB — COMPREHENSIVE METABOLIC PANEL
ALT (GPT): 54 U/L (ref 12–79)
AST (GOT): 45 U/L — ABNORMAL HIGH (ref 10–44)
Albumin: 4.5 g/dL (ref 3.5–5.2)
Alkaline Phosphatase (Total): 48 U/L (ref 36–122)
Anion Gap: 9 (ref 5–15)
Bilirubin (Total): 1 mg/dL (ref 0.1–1.0)
Calcium: 10.4 mg/dL — ABNORMAL HIGH (ref 8.9–10.2)
Carbon Dioxide, Total: 28 mEq/L (ref 22–32)
Chloride: 103 mEq/L (ref 98–108)
Creatinine: 0.9 mg/dL (ref 0.3–1.2)
Glucose: 95 mg/dL (ref 62–125)
Potassium: 4.4 mEq/L (ref 3.7–5.2)
Protein (Total): 8.3 g/dL — ABNORMAL HIGH (ref 6.0–8.2)
Sodium: 140 mEq/L (ref 136–145)
Urea Nitrogen: 6 mg/dL — ABNORMAL LOW (ref 8–21)

## 2003-01-04 LAB — CHOLESTEROL (TOTAL & HDL)
Cholesterol/HDL Ratio: 3.6
HDL Cholesterol: 51 mg/dL (ref >40)
Total Cholesterol: 185 mg/dL (ref <200)

## 2003-01-04 MED ORDER — NORVASC 10 MG OR TABS
ORAL_TABLET | ORAL | Status: DC
Start: 2003-01-04 — End: 2003-09-23

## 2003-01-04 MED ORDER — LISINOPRIL 40 MG OR TABS
ORAL_TABLET | ORAL | Status: DC
Start: 2003-01-04 — End: 2003-09-16

## 2003-01-04 NOTE — Nursing Note (Signed)
>>   [name], [name]               01/04/2003 11:34 am  Pt presents for BP check and c/o nausea.  Pt c/o left sided chest discomfort/ache with palpitations past several weeks.    Pt stopped taking HCTZ due to side effects.

## 2003-01-04 NOTE — Progress Notes (Addendum)
Addended by: Reynaldo Minium on: 01/07/2003 9:53:30 AM     Modules accepted: Orders    Addended by: Hadassah Pais A on: 01/04/2003 9:57:06 PM    Roger [name] is a 42 year old male who presents for the following concern:    1. blood pressure has been really high. Has been taking lisinopril 40 mg daily. Was on HCTZ but had sexual SE from that so stopped.  2. chest pain, nagging ache, "pinch", comes and goes, no particular pattern.  3. intermittent epigastric pain, worse with increased alcohol intake    Patient Active Problem List:    SPRAIN LUMBAR REGION; work injury [id number][454.0]   Date Noted: 01/20/2001   HYPERTENSION NOS[401.9]   Date Noted: 06/02/2001   HYPERCALCEMIA[275.42]   Date Noted: 06/11/2001   ELEV TRANSAMINASE/LDH[790.4]   Date Noted: 06/11/2001   FAMILY HX GI MALIGNANCY[V16.0]   Date Noted: 05/01/2002    PMH, PSH reviewed and updated as appropriate; see histories tab of EMR for details.    Current outpatient prescriptions:  LISINOPRIL 40 MG OR TABS 1 TABLET DAILY,,,    Review of patient's family history indicates:   Hypertension Mother    Comment: stroke    SH: drinks at least 2 drinks/night  has not been getting as much exercise  nonsmoker    ROS:  no shortness of breath or wheezing  no edema  no palpitations      PE:  BP 168/108   Pulse 104   Wt 212 lbs (96.163 kg)  BP repeated and confirmed at 160/100  pulse regular  good muscle mass  Chest: CTA bilat; good air movement; no ronchi or wheezing.  Car: RRR, S1/S2 normal, no murmur  Ext: Normal      EKG: LVG, o/w normal      Assessment and Plan:  401.9 HYPERTENSION NOS (primary encounter diagnosis)  Note: pretty severe htn with LVG on EKG  cont. ace, add norvasc and follow-up in 2 weeks  check blood work today  refer to stress test prior to embarking on strenuous exercise program due to risk factors and chest pain  Plan: COMPREHENSIVE METABOLIC PANEL, CHOLESTEROL    (TOTAL & HDL), NORVASC 10 MG OR TABS,    LISINOPRIL 40 MG OR TABS            275.42 HYPERCALCEMIA  Note: hx of same, recheck  Plan: PARATHYROID HORMONE W/CALCIUM       786.50 CHEST PAIN NOS  Note: see above  Plan: CONSULT TO CARDIAC STRESS TEST, EKG       follow-up 2-3 weeks      Also advised pt to cut down on alcohol to less than 1 drink/night, and to cut down on salt intake of which he uses a lot.

## 2003-01-08 LAB — PARATHYROID HORMONE W/CALCIUM
Calcium: 10.3 mg/dL — ABNORMAL HIGH (ref 8.9–10.2)
Parathyroid Hormone: 53 pg/mL (ref 10–69)

## 2003-01-15 ENCOUNTER — Encounter (INDEPENDENT_AMBULATORY_CARE_PROVIDER_SITE_OTHER): Payer: Self-pay | Admitting: Cardiovascular Disease

## 2003-01-15 ENCOUNTER — Ambulatory Visit (HOSPITAL_BASED_OUTPATIENT_CLINIC_OR_DEPARTMENT_OTHER): Payer: No Typology Code available for payment source

## 2003-01-15 NOTE — Progress Notes (Signed)
Addended by: Stanford Breed on: 01/16/2003 3:04:49 PM     Modules accepted: Orders     TREADMILL STRESS ECHO (TME) REPORT     Name: Roger [name]   Date of Exam: 01/15/2003 Tape #: 49  Age: 42 year old Sex: male  Vitals: There were no vitals taken for this visit.  Current Meds: Current outpatient prescriptions:  NORVASC 10 MG OR TABS Take 1 tablet by mouth every day for blood pressure,,,   LISINOPRIL 40 MG OR TABS 1 TABLET DAILY,,,    ~~~~~~~~~~~~~~~~~~~~~~~~~~~~~~~~~~~~~~~~~~~~~~~~~~~~~    Procedures Performed: ECHO HEART, FULL STRESS/REST    Image Quality/Endocardial Definition:    Baseline/rest: Parasternal= Good   Apical= Fair   Immediately post-exercise: Parasternal= Good   Apical= Good - Fair    Resting Images: Estimates of LV wall thicknesses measured    using 2D images:IVS= 8-71mm dias/12-71mm syst(nl 7-38mm dias)   PLVW= 9-22mm dias/12-42mm syst(nl 7-41mm dias)   Cardiac Sonographer: Erenest Blank, RDCS    ~~~~Physician's Final Treadmill Stress Echo Report~~~    Roger [name] is a 42 year old with a history of hypertension and precordial chest discomfort. A stress echocardiogram is ordered to evaluate for the presence of inducible ischemia.    Findings:  After informed consent was obtained, the patient underwent exercise treadmill stress echocardiography utilizing a standard Bruce protocol.    1. Baseline EKG demonstrated normal sinus rhythm with voltage criteria for left ventricular hypertrophy. There are nonspecific ST T wave cahanges. Baseline blood pressure is elevated at 158/90    2. Baseline echocardiography demonstrated normal left ventricular function with no regional wall motion abnormalities noted. Measurements of left ventricular wall thickness do not meet criteria for left ventricular hypertrophy. However, visually, wall thickness is at the upper limits of normal.    3. The patient tolerated the procedure well completing stage III (total exercise duration of 9 minutes) of a  standard Bruce protocol. Patient achieved 10.1 METS. Peak heart rate was 169 BPM (95% of maximal predicted heart rate) and peak blood pressure was 200/90.    4. The test was discontinued due to fatigue. The patient did not experience any chest discomfort.    5. At peak stress, there were no ST changes interpreted as negative for myocardial ischemia.    6. There was no inducible ischemia after peak stress on echocardiographic images.     Conclusion:  Normal resting left ventricular function. Borderline increased wall thickness with significant resting and stress associated hypertension. No evidence of inducible ischemia. No provocation of symptoms/chest discomfort with stress.    Will Bonnet, MD  Department of Cardiology  [facility]

## 2003-01-30 ENCOUNTER — Ambulatory Visit (INDEPENDENT_AMBULATORY_CARE_PROVIDER_SITE_OTHER): Payer: No Typology Code available for payment source | Admitting: Internal Medicine

## 2003-01-30 VITALS — BP 146/82 | HR 88 | Wt 215.0 lb

## 2003-01-30 LAB — PR U/A AUTO DIPSTICK ONLY, ONSITE
Bilirubin (Indirect): NEGATIVE
Glucose: NEGATIVE mg/dL
Ketones, URN: NEGATIVE mg/dL
Leukocytes: NEGATIVE
Nitrite, URN: NEGATIVE
Occult Blood, URN: NEGATIVE
Specific Gravity, URN: 1.015 (ref 1.005–1.030)
Urobilinogen, URN: NORMAL E.U./dL
pH, Whole Blood: 6.5 (ref 5.0–8.0)

## 2003-01-30 NOTE — Nursing Note (Signed)
>>   Roger [name] [facility]                   01/30/2003 10:07 am  Pt. here for f/u blood pressure and result on stress test. Pt. c/o of frequent urination and burn with irritation in stomach X 2months.

## 2003-01-30 NOTE — Progress Notes (Signed)
Roger [name] is a 42 year old male. Patient presents with:    Follow-Up    Blood Pressure   Results   Urine Problem      History of Present Illness:  Roger [name] presents with following:    1. HTN, f/u on stress test - Stress test showed no evidence of ischemia, minimal LV wall thickening. He is doing well on his lisinopril and Norvasc.   2. urine problem - He feels that each time he drinks a glass of water, he urinates shortly thereafter, sometimes 2-3 times. He also urinates 2-3 times each night. No hesitancy, stream is strong, feels bladder empties fully. He drinks a fair amount of water and often drinks a lot of it between dinner and bedtime.    Review of patient's past medical history indicates:   HYPERTENSION NOS 1996    Comment: on 3 drugs, some side effects from medications.    Review of patient's past surgical history indicates:     Comment: none    Patient Active Problem List:    SPRAIN LUMBAR REGION; work injury [id number][782.9]   HYPERTENSION NOS[401.9]   HYPERCALCEMIA[275.42]   ELEV TRANSAMINASE/LDH[790.4]   FAMILY HX GI MALIGNANCY[V16.0]      Current outpatient prescriptions:  NORVASC 10 MG OR TABS Take 1 tablet by mouth every day for blood pressure,,,   LISINOPRIL 40 MG OR TABS 1 TABLET DAILY,,,        Review of patient's family history indicates:   Hypertension Mother    Comment: stroke       Tobacco Use: Never    Alcohol Use: Yes 6 oz/week   Comment: 6 beers/week       Allergies:No Known Drug Allergies  , Atenolol      ROS:  Gen:no fever, sweats, chills  CV:no chest pain, palpitations, dyspnea  GU:see HPI      Exam:  Last 5 BP Readings:   Date: BP:   01/30/2003 146/82 -> 140/86   01/15/2003 140/86   01/04/2003 168/108   04/25/2002 146/88   12/19/2001 130/90      Gen: healthy, alert, no distress  Prostate: The prostate in normal in size, is symmetric with no nodules, is nontender.  Exam otherwise deferred.    UA - trace protein, otherwise normal    IMPRESSION/PLAN:    788.41 URINARY  FREQUENCY (primary encounter diagnosis)  Note: Discussed not drinking water after dinner, monitoring for improvement in symptoms. Does not seem to be related to the prostate.  Plan: U/A AUTO DIPSTICK ONLY       401.9 HYPERTENSION NOS  Note:   Plan: Continue lisinopril 40mg  and norvasc 10mg , discussed low sodium diet and exercise with weight loss goal of 15-20 pounds over the next three months.

## 2003-04-10 ENCOUNTER — Telehealth (INDEPENDENT_AMBULATORY_CARE_PROVIDER_SITE_OTHER): Payer: Self-pay | Admitting: Internal Medicine

## 2003-04-10 MED ORDER — VIAGRA 100 MG OR TABS
ORAL_TABLET | ORAL | Status: DC
Start: 2003-04-10 — End: 2004-07-14

## 2003-04-10 NOTE — Telephone Encounter (Signed)
>>   ELISA [facility] Wed Apr 10, 2003 2:10 PM  Last ov 01/30/03.    >> [facility] Wed Apr 10, 2003 1:06 PM  Pt of Melodye Ped, MD.    REFILL CALL    Prescribing Provider: Melodye Ped, MD  Medication(s) requested:Viagra  Dose taking now: n/a  Reason for request: refill  Written prescription needed? NO  To Pharmacy Name: [facility]   Pharmacy location:[place] [address]  Pharmacy phone #:[phone]  Preferred phone #: [phone] (home)  Okay leave VM or msg with someone?YES    TSR - Please inform patient that refills typically take 1-2 business days and to call their pharmacy to verify status. done

## 2003-04-16 ENCOUNTER — Encounter (INDEPENDENT_AMBULATORY_CARE_PROVIDER_SITE_OTHER): Payer: Self-pay | Admitting: Internal Medicine

## 2003-04-16 ENCOUNTER — Ambulatory Visit (INDEPENDENT_AMBULATORY_CARE_PROVIDER_SITE_OTHER): Payer: No Typology Code available for payment source | Admitting: Internal Medicine

## 2003-04-16 VITALS — BP 140/90 | Temp 99.6°F | Wt 210.0 lb

## 2003-04-16 MED ORDER — NAPROXEN 500 MG OR TABS
ORAL_TABLET | ORAL | Status: DC
Start: 2003-04-16 — End: 2004-02-14

## 2003-04-16 NOTE — Nursing Note (Signed)
>>   VU-LUU, MAI H                    04/16/2003 12:10 pm  pt is here for right knee pain 2 months, no injury.

## 2003-04-16 NOTE — Progress Notes (Signed)
Roger [name] is a 42 year old male. Patient presents with:    Musculoskeletal Problem      History of Present Illness:   Roger Tell Rozelle presents with right knee pain for the past two months. No injury that he can recall. It is 8/10 at its worst. It is worse when he kneels on it, stands on it for a long time, or walks up stairs. It is better with rest. He does report a popping sound at times, no locking or catching.    Review of patient's past medical history indicates:   HYPERTENSION NOS 1996    Comment: on 3 drugs, some side effects from medications.    Review of patient's past surgical history indicates:   NO PAST SURGICAL HISTORY     Patient Active Problem List:    SPRAIN LUMBAR REGION; work injury [id number][454.0]   HYPERTENSION NOS[401.9]   HYPERCALCEMIA[275.42]   ELEV TRANSAMINASE/LDH[790.4]   FAMILY HX GI MALIGNANCY[V16.0]      Current outpatient prescriptions:  VIAGRA 100 MG OR TABS Take 1/2-1 tablet 1 hour before sexual activity, not to exceed 1 tablet per day,,,  NORVASC 10 MG OR TABS Take 1 tablet by mouth every day for blood pressure,,,   LISINOPRIL 40 MG OR TABS 1 TABLET DAILY,,,        Review of patient's family history indicates:   Hypertension Mother    Comment: stroke       Tobacco Use: Never    Alcohol Use: Yes 6 oz/week   Comment: 6 beers/week       Allergies:No Known Drug Allergies  , Atenolol      ROS:  Constitutional:no fever, sweats, chills  Neuro:no weakness, numbness, tingling  Musculoskeletal:see HPI      Exam:  BP 140/90   Temp 99.6   Temp Src: Oral   Wt 210 lbs (95.255 kg)  Gen: healthy, alert, no distress  Right knee: There is no deformity, but there is a small effusion and there is mild warmth associated wtih this. There is no focal tenderness. There is no instability with anterior or posterior drawer and he has a negative McMurray test.  Exam otherwise deferred.    Xrays right knee - 2 views are normal    Procedure: right knee aspiration  Procedure explained nformed consent  given by patient.    IMPRESSION/PLAN:    719.46 JOINT PAIN-LOWER LEG (primary encounter diagnosis)  Note: I am unsure whether his effusion is inflammatory or whether it is due to something else - arthritis, loose body, perhaps even Baker's cyst. Per patient it is chronic, refer to Dr. Loyal Buba to see if he thinks tap is indicated or whether he would proceed with MRI.  Plan: X-RAY KNEE 2 VIEW, CONSULT TO SPORTS MEDICINE, NAPROXEN 500 MG OR TABS       Total time spent in this encounter over 25 minutes, greater than 50% of it in counseling and discussion.

## 2003-04-20 ENCOUNTER — Ambulatory Visit (INDEPENDENT_AMBULATORY_CARE_PROVIDER_SITE_OTHER): Payer: No Typology Code available for payment source | Admitting: Pediatric Medicine

## 2003-04-20 VITALS — BP 126/82 | HR 80

## 2003-04-20 NOTE — Nursing Note (Signed)
>>   [name], [name]                04/20/2003 10:31 am  Pt presents for evaluation of right knee pain & swelling.  Referred by Dr.Lauinger.

## 2003-04-20 NOTE — Progress Notes (Signed)
Roger [name].   Right knee pain and swelling x 2 months.   Sligh pain and nagging ache.   Noted swelling about one month ago.   Dr Roger [name]: Has not taken naprosyn. Xray normal.   No trauma, no specific twisint injury. No new activity or chagne activity.   Works at [employer]; lots of walking.   Hurts to bend and squat. No pain with twist or pivoting, Prolonged standing hurts. Kneeling hurts.   Catches and pops at times Almost locks.  No instability.   No AM stiffness, hurts with use, better with rest.     PMHx: Healthy, HTN  Surg: None  Meds: Lisinporil, Norvasc.   ROS: No joitn pain, no fever,     Transcription accepted by Roger [name] on 04/25/2003 at 7:25 AM  ------    Roger, [name] [id number]  04/20/2003    PROBLEM: Right knee pain, rule out medial meniscus tear.    SUBJECTIVE: Roger [name] is a 42 year old male who comes in today for right  knee pain. He was kindly sent here by Roger [name] of this clinic for  evaluation and consultation this matter. Roger [name] has had right knee  pain and swelling for two months. This is a slight pain and a nagging  ache. He has noted swelling about one month ago. He was seen by Dr.  Barbette [name] who obtained x-rays of his knee which were normal. 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 He had no trauma, no specific injury of  twisting or falling and he has also noted no new activity or changed  activity to explain this pain. Roger [name] reports that he works at [employer] and does lots of walking as well as bending and squatting. He  does not report pain with twisting or pivoting but, whenever he squats  or bends, there is significant pain. Also, prolonged standing hurts  and kneeling hurts. The knee will catch and pop and also lock. There  has been no frank instability. He has no a.m. stiffness. The pain is  also much better with rest and much worse with  using his leg.    PAST MEDICAL HISTORY: No hypertension.    SURGERIES: None.    MEDICATIONS: Lisinopril and Norvasc.    REVIEW OF SYSTEMS: Negative for joint pain, fever, any signs of  systemic illness. Roger [name] is otherwise well.    OBJECTIVE: On examination today, Roger [name] is an alert, pleasant man in  no apparent distress. Directed exam of his right knee shows a moderate  sized effusion. There is also increased warmth compared to the  unaffected left side. The knee shows no pain at the patellar tendon,  quadriceps tendon, medial, lateral, patellar facets or femoral  condyles. There is a negative apprehension finding. There is,  however, medial joint line tenderness and pain with McMurray test. The  lateral joint line is nontender. The knee is stable to varus and  valgus stress with fine endpoints. There is a negative Lachman's as  well as negative anterior and posterior drawer test. Flexibility  testing of the knee was not performed today. There was no other  obvious effusion that I can see, on brief examination of the rest of  the musculoskeletal system.    ASSESSMENT: Roger [name] is a very pleasant, 42 year old with left knee pain  as well as medial joint line tenderness and an  effusion. This has been  going on two to three months. The knee is increased in warmth,  however, I believe, if this was an infection or other systemic problem,  the knee would be getting worse and worse.    PLAN: I will obtain an MRI of Roger [name]'s knee in the very near future.  Until then, I asked Roger [name] to either try to avoid work or avoid any  twisting or bending activities. A note was written to this effect. I  look forward to see Roger [name] back after his MRI is complete.      Roger [name], M.D.    [id number] DD:04/22/2003 DT:04/23/2003 AVW#:[id number]

## 2003-04-30 ENCOUNTER — Ambulatory Visit (INDEPENDENT_AMBULATORY_CARE_PROVIDER_SITE_OTHER): Payer: No Typology Code available for payment source | Admitting: Pediatric Medicine

## 2003-04-30 VITALS — Wt 210.0 lb

## 2003-04-30 NOTE — Progress Notes (Addendum)
Addended by: Vista Deck on: 05/03/2003 3:19:15 PM    Right knee  Now 2-3 months.   MRI showed effusion and little else.     MARICUS, TANZI [id number]  04/30/2003    PROBLEM: Follow-up right knee effusion.    SUBJECTIVE: The patient comes in for follow-up of his right knee effusion. In review, he has had this for two to three months. Since I last saw him he has been off work and with the rest he feels that his effusion is much, much better. He has no pain and no obvious popping or grinding sensations currently. Again, he has been off work and has had no further bending, twisting or pivoting activity. He would like to get  back to work if possible. He also had his MRI done, which showed a popliteal effusion and chondromalacia but normal menisci without any signs of tear.    OBJECTIVE: On examination today the patient is an alert, pleasant man in no apparent distress. Directed exam of his right knee today shows a truly minimal, tiny effusion. This is much, much better from our examination 10 days ago. There is no increased warmth, there is no redness at all. There is slight pain on palpation of the medial and lateral patellar facets. There is a palpable medial plica. Patellar tendon and quadriceps tendon are nontender. Joint line is completely  nontender now. Range of motion of the knee for McMurray's testing is negative for any pain. The knee is stable to varus and valgus stress. There is a fine Lachman's test. Flexibility testing of the hamstring and quadriceps was not performed today.    ASSESSMENT: Right knee pain. This with a negative MRI without obvious meniscal tear. 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 Clinically today the patient has a very tiny effusion which is much improved from his past exams. He otherwise has a stable knee. There is no sign of any obvious infection or other significant pathology that I can detect.    PLAN: I would like him to try to go back to work. A return to work form  was filled out today by me. I would like him to avoid any crawling or repeat recurrent squatting activity until we are certain that he is doing well. Also, over the next two weeks he will take his Naprosyn, 500 mg, twice daily. He has been completely off this medication. He was afraid that it would effect him at his work and he did not take it when  he was off work. He can take this regularly as he goes back to work. I will see him back in two weeks. If with the medication he has no effusion and full function, will consider a full return to work with full squatting and bending activity at that time.    Ernie Avena, M.D.    [id number] DD:04/30/2003 DT:05/02/2003 BJY#:[id number]

## 2003-04-30 NOTE — Nursing Note (Signed)
>>   [name], Roger                     04/30/2003 2:43 pm  C/o F/U on MRI for R knee. Pt also needs eval to return to work. Pt on meds listed.

## 2003-05-01 ENCOUNTER — Ambulatory Visit (INDEPENDENT_AMBULATORY_CARE_PROVIDER_SITE_OTHER): Payer: Self-pay | Admitting: Internal Medicine

## 2003-05-14 ENCOUNTER — Ambulatory Visit (INDEPENDENT_AMBULATORY_CARE_PROVIDER_SITE_OTHER): Payer: No Typology Code available for payment source | Admitting: Pediatric Medicine

## 2003-05-14 VITALS — Wt 210.0 lb

## 2003-05-14 NOTE — Progress Notes (Signed)
Roger [name] is a 42 year old male who comes to clinic todayfor a chief concern of F/U left knee pain. Now about 3 months of sx. Negative MRI for meniscus and tendons but did show chondromalacia.  For past 2 weeks back at work (but no kneeling and no squatting). He is doing "okay" but still sore a little a knee cap. Taking the naprosyn now occasionally when he remembers. No swelling, no locking and no instability.       Current problems include:  Patient Active Problem List:    SPRAIN LUMBAR REGION; work injury [id number][454.0]   HYPERTENSION NOS[401.9]   HYPERCALCEMIA[275.42]   ELEV TRANSAMINASE/LDH[790.4]   FAMILY HX GI MALIGNANCY[V16.0]   JOINT EFFUSION-L/LEG;Right Knee[719.06]    Review of patient's past medical history indicates:   HYPERTENSION NOS 1996    Comment: on 3 drugs, some side effects from medications.  Meds include:  No hospital prescriptions on file as of 05/14/03.  outpatient prescriptions as of 05/14/03:  NAPROXEN 500 MG OR TABS Take 1 tablet by mouth 2 times per day as needed for pain,,,  VIAGRA 100 MG OR TABS Take 1/2-1 tablet 1 hour before sexual activity, not to exceed 1 tablet per day,,,  NORVASC 10 MG OR TABS Take 1 tablet by mouth every day for blood pressure,,,   LISINOPRIL 40 MG OR TABS 1 TABLET DAILY,,,          O:  JWJ:XBJYN, pleasant male NAD  HEENT: NC/AT    EXTR: Right knee: No effusion: There is tenderness at patellar tendon only.   There is no joint line tenderness. Ligaments are grossly stable.   Quads show HTB of almost 80 degrees right and70 degrees left.     Skin: no rash  Neuro: alert, interactive and appropriate for age.    A: Right knee pain: This looks alot like Patellar tendonitis now. Very tight quads.     P: Okay for release to work if no pain.   Pt shown Quad stretches and will send to PT at [facility] as well. Will see back in one month for recheck.

## 2003-05-14 NOTE — Nursing Note (Signed)
>>   [name], [name]                      05/14/2003 9:45 am  C/O R knee pain. Knee is slightly better per pt. MRI was normal per pt. No PT. Pt needs questionaire filled out for employer.

## 2003-09-16 ENCOUNTER — Telehealth (INDEPENDENT_AMBULATORY_CARE_PROVIDER_SITE_OTHER): Payer: Self-pay | Admitting: Internal Medicine

## 2003-09-16 MED ORDER — LISINOPRIL 40 MG OR TABS
ORAL_TABLET | ORAL | Status: DC
Start: 2003-09-16 — End: 2004-06-13

## 2003-09-16 NOTE — Telephone Encounter (Signed)
>>   Roger [name] Mon Sep 16, 2003 5:11 PM  Confirmed fax done.    >> [name] [facility] Mon Sep 16, 2003 11:20 AM  Fax received from [employer] requesting RF(s).  1-Name of requested medication: lisiopril  2-Class of medication requested: Blood Pressure Medication  3-Date last refilled: 07/19/03  4-Date of last office visit: 05/14/03 w/Jinguji,   Last IM OV: 04/16/03 w/Lauinger, echo 01/15/03 w/Dr.Freeman  Last PCP visit was 01/04/03  5-Next office visit: none scheduled   6-Is there a protocol for this medication? Yes, Blood Pressure: Is plan for HTN being followed? Yes, chemistry panel with creatinine in chart? Yes, Is it normal? No, abn on 01/04/03. Fwd to provider.

## 2003-09-23 ENCOUNTER — Telehealth (INDEPENDENT_AMBULATORY_CARE_PROVIDER_SITE_OTHER): Payer: Self-pay | Admitting: Internal Medicine

## 2003-09-23 NOTE — Telephone Encounter (Signed)
>>   Roger [name] Mon Sep 23, 2003 4:05 PM  Fax received from [facility] requesting RF(s).  1-Name of requested medication: Norvasc  2-Class of medication requested: Blood Pressure Medication  3-Date last refilled: 08/22/03  4-Date of last office visit: 05/14/03 w/Jinguji  5-Next office visit (scheduled or expected): none scheduled   6-Is there a protocol for this medication? Yes, Blood Pressure: Is plan for HTN being followed? Yes, chemistry panel with creatinine in chart? Yes, Is it normal? Creatinine is WNL at 0.9 on 01/04/03. Fwd to MD.

## 2003-09-24 MED ORDER — NORVASC 10 MG OR TABS
ORAL_TABLET | ORAL | Status: DC
Start: 2003-09-23 — End: 2004-07-08

## 2004-02-14 ENCOUNTER — Ambulatory Visit (INDEPENDENT_AMBULATORY_CARE_PROVIDER_SITE_OTHER): Payer: No Typology Code available for payment source | Admitting: Internal Medicine

## 2004-02-14 VITALS — BP 156/102 | HR 96 | Wt 213.0 lb

## 2004-02-14 MED ORDER — FLEXERIL 10 MG OR TABS
ORAL_TABLET | ORAL | Status: AC
Start: 2004-02-14 — End: 2004-02-23

## 2004-02-14 MED ORDER — PIROXICAM 20 MG OR CAPS
ORAL_CAPSULE | ORAL | Status: DC
Start: 2004-02-14 — End: 2005-03-06

## 2004-02-14 NOTE — Nursing Note (Signed)
>>   Roger [name], Roger [name]                 02/14/2004 1:49 pm  Pt c/o low back pain for about 2 week.    Pt checked BP at pharmacy couple days ago & it read 160/120.

## 2004-02-14 NOTE — Progress Notes (Signed)
Roger [name] is a 43 year old male who presents for the following concern:    1. flareups of lower back pain, on and off. Hurts if sitting or bends down, felt sharp pain at the small of his back.   He keeps trying to stretch it out, has aching /nagging pain. Radiates into both hips and upper buttock region. One day it did go down to the L knee.   He has tried ibuprofen and naproxen which only dull the pain.  HE had a work injury in 2002 with lumbar strain; at that time has some mildly abnormal findings on xray including facet joint sclerosis and mild degenerative spurs. He is worried about a disc.    2. BP problems, not controlled on current meds. However, he did run out of lisinopril.     Patient Active Problem List:    SPRAIN LUMBAR REGION; work injury [id number][454.0]   Date Noted: 01/20/2001   HYPERTENSION NOS[401.9]   Date Noted: 06/02/2001   HYPERCALCEMIA[275.42]   Date Noted: 06/11/2001   ELEV TRANSAMINASE/LDH[790.4]   Date Noted: 06/11/2001   FAMILY HX GI MALIGNANCY[V16.0]   Date Noted: 05/01/2002   PATELLAR TENDINITIS;Right[726.64]   Date Noted: 05/14/2003      PMH, PSH reviewed and updated as appropriate; see histories tab of EMR for details.    Current outpatient prescriptions:  NORVASC 10 MG OR TABS Take 1 tablet by mouth every day for blood pressure,,,  LISINOPRIL 40 MG OR TABS 1 TABLET DAILY,,,  VIAGRA 100 MG OR TABS Take 1/2-1 tablet 1 hour before sexual activity, not to exceed 1 tablet per day,,,      ROS:  denies bowel/bladder dysfunction  see HPI for rest of ROS  denies fever/chills      PE:  BP 156/102   Pulse 96   Wt 213 lbs (96.616 kg)  looks well  back: tender lower lumbar area  strength of LE's normal, reflexes WNL  gait normal      Assessment and Plan:  724.2 LOW BACK PAIN(LUMBAGO) (primary encounter diagnosis)  Note: recurrent lower back pain, 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 Explained in detail to pt natural hx of lower back pain. He wants to be able to work pain free. Explained to him he may  well have a small bulging disc but without worse sx than his would not recommend any surgical evaluation.  rec. consultation with physiatry for further recs re: necessary eval and treatment   Trial of different nsaid and m. relaxer at HS to help him sleep  Plan: CONSULT TO PHYS MED/REHAB ADULT, FLEXERIL 10 MG   OR TABS, PIROXICAM 20 MG OR CAPS     Htn: pt to restart meds and monitor, will return if it is not controlled on his meds

## 2004-02-15 ENCOUNTER — Encounter (INDEPENDENT_AMBULATORY_CARE_PROVIDER_SITE_OTHER): Payer: Self-pay | Admitting: Internal Medicine

## 2004-02-17 ENCOUNTER — Ambulatory Visit (INDEPENDENT_AMBULATORY_CARE_PROVIDER_SITE_OTHER): Payer: Self-pay | Admitting: Internal Medicine

## 2004-03-09 ENCOUNTER — Encounter (HOSPITAL_BASED_OUTPATIENT_CLINIC_OR_DEPARTMENT_OTHER): Payer: No Typology Code available for payment source | Admitting: Physical Medicine & Rehabilitation

## 2004-06-13 ENCOUNTER — Telehealth (INDEPENDENT_AMBULATORY_CARE_PROVIDER_SITE_OTHER): Payer: Self-pay | Admitting: Internal Medicine

## 2004-06-13 MED ORDER — LISINOPRIL 40 MG OR TABS
ORAL_TABLET | ORAL | Status: DC
Start: 2004-06-13 — End: 2004-07-08

## 2004-06-13 NOTE — Telephone Encounter (Signed)
>>   Roger [name] ZOX Jun 13, 2004 10:12 AM  Received fax from [facility] pharmacy requesting refill of Lisinopril 40mg .     1/Last filled: 04/16/04    2/-Last seen: Last seen 02/14/04    3/-Next follow up: none    4/-Last lab: 1/05, ABNL.    pls advise.

## 2004-07-08 ENCOUNTER — Telehealth (INDEPENDENT_AMBULATORY_CARE_PROVIDER_SITE_OTHER): Payer: Self-pay | Admitting: Internal Medicine

## 2004-07-08 MED ORDER — NORVASC 10 MG OR TABS
ORAL_TABLET | ORAL | Status: DC
Start: 2004-07-08 — End: 2005-06-25

## 2004-07-08 NOTE — Telephone Encounter (Signed)
>>   [facility] THOEUN Wed Jul 08, 2004 10:38 AM  Received fax from [facility] requesting refill.    1-Name of requested medication: Norvasc 10mg   2-Class of medication requested: Blood Pressure Medication  3-Date last refilled: 06/01/04  4-Date of last office visit: 02/14/04  5-Next office visit (scheduled or expected): none  6-Is there a protocol for this medication? Yes, Blood Pressure: Is plan for HTN being followed? Yes, chemistry panel with creatinine in chart (Creatinine 0.9 on 01/04/03 ? Yes, Is it normal? Yes, Refill until next visit due (6 mo. from last).      Left message for patient to schedule f/u HTN w/ Dr. Virginia Rochester.

## 2004-07-14 ENCOUNTER — Telehealth (INDEPENDENT_AMBULATORY_CARE_PROVIDER_SITE_OTHER): Payer: Self-pay | Admitting: Internal Medicine

## 2004-07-14 MED ORDER — VIAGRA 100 MG OR TABS
ORAL_TABLET | ORAL | Status: DC
Start: 2004-07-14 — End: 2005-07-31

## 2004-07-14 NOTE — Telephone Encounter (Signed)
>>   GENEVEVE Boneta Lucks) FLORES Tue Jul 14, 2004 12:17 PM  Confirmed fax done.    >> Gerlene Fee Tue Jul 14, 2004 8:55 AM  Received fax from [facility] requesting refill of Viagra 100mg  . Last filled 02/23/04 . Last seen 3/05. Future appt.: none please advise.

## 2004-09-04 ENCOUNTER — Ambulatory Visit (INDEPENDENT_AMBULATORY_CARE_PROVIDER_SITE_OTHER): Payer: Self-pay | Admitting: Internal Medicine

## 2004-09-04 NOTE — Nursing Note (Signed)
>>   Roger [name] [facility]     09/04/2004   12:05 pm  Mailed no show letter #1 to patient.

## 2004-10-21 ENCOUNTER — Telehealth (INDEPENDENT_AMBULATORY_CARE_PROVIDER_SITE_OTHER): Payer: Self-pay | Admitting: Internal Medicine

## 2004-10-21 NOTE — Telephone Encounter (Signed)
>>   [name] [facility] Wed Oct 21, 2004 3:06 PM  Called pt and advised of below. Pt unable to make it for 3:50om today. Scheduled tomorrow 9:10am.    >> [name] [facility] Wed Oct 21, 2004 1:26 PM  He actually needs to be seen for a note for work. He can see anyone.     >> [name] [facility] Wed Oct 21, 2004 1:02 PM  Slept all day yesterday & through the night, still tired this AM so didn't go to work. Wasn't keeping anything down yesterday, hot/cold yesterday. Kept some Gatorade down last night, chicken soup down this AM. No fever, no cough, a little phlegm in throat. I told pt I didn't think he needed appt today, just rest & fluids. Pt states his employer requires MD note, wondering if Dr.Despreaux can write note for today and possibly tomorrow?    >> Azzie Glatter Wed Oct 21, 2004 10:03 AM  TRIAGE/ADVICE  -----------  PCP: Melodye Ped, MD  Main reason for calling is: Pt states he has lack of appetite, fatigue, no fever. Patient is requesting to speak to clinic staff regarding sxs.  How long has this episode lasted: 2 days   Preferred phone number: Telephone Information:  Home Phone [phone]  Okay to leave VM or msg with someone? YES

## 2004-10-22 ENCOUNTER — Ambulatory Visit (INDEPENDENT_AMBULATORY_CARE_PROVIDER_SITE_OTHER): Payer: No Typology Code available for payment source | Admitting: Internal Medicine

## 2004-10-22 ENCOUNTER — Encounter (INDEPENDENT_AMBULATORY_CARE_PROVIDER_SITE_OTHER): Payer: Self-pay | Admitting: Internal Medicine

## 2004-10-22 VITALS — BP 130/90 | HR 78 | Temp 98.0°F | Wt 190.0 lb

## 2004-10-22 LAB — COMPREHENSIVE METABOLIC PANEL
ALT (GPT): 54 U/L (ref 12–79)
AST (GOT): 31 U/L (ref 10–44)
Albumin: 4.3 g/dL (ref 3.5–5.2)
Alkaline Phosphatase (Total): 55 U/L (ref 36–122)
Anion Gap: 9 (ref 5–15)
Bilirubin (Total): 0.7 mg/dL (ref 0.2–1.3)
Calcium: 10.5 mg/dL — ABNORMAL HIGH (ref 8.9–10.2)
Carbon Dioxide, Total: 28 mEq/L (ref 22–32)
Chloride: 104 mEq/L (ref 98–108)
Creatinine: 0.9 mg/dL (ref 0.3–1.2)
Glucose: 112 mg/dL (ref 62–125)
Potassium: 5.2 mEq/L (ref 3.7–5.2)
Protein (Total): 7.8 g/dL (ref 6.0–8.2)
Sodium: 141 mEq/L (ref 136–145)
Urea Nitrogen: 9 mg/dL (ref 8–21)

## 2004-10-22 LAB — CBC, DIFF
Absolute Eosinophil Count: 0.04 10*3/uL (ref 0–0.50)
Absolute Lymphocyte Count: 1.54 10*3/uL (ref 1.00–4.80)
Basophils: 0.04 10*3/uL (ref 0–0.20)
Hematocrit: 46 % (ref 38–50)
Hemoglobin: 15.4 g/dL (ref 13.0–18.0)
MCH: 28.2 pg (ref 27.3–33.6)
MCHC: 33.6 g/dL (ref 32.3–35.7)
MCV: 84 fL (ref 81–98)
Monocytes: 0.42 10*3/uL (ref 0–0.80)
Neutrophils: 2.12 10*3/uL (ref 1.80–7.00)
Platelet Count: 325 10*3/uL (ref 150–400)
RBC: 5.47 mil/uL (ref 4.40–5.60)
WBC: 4.16 10*3/uL — ABNORMAL LOW (ref 4.3–10.0)

## 2004-10-22 LAB — AMYLASE: Amylase Total: 160 U/L — ABNORMAL HIGH (ref 27–144)

## 2004-10-22 LAB — AMYLASE, FRACTIONS (SAL/PANC)
Amylase (Pancreatic): 46 U/L (ref 14–77)
Amylase, Salivary: 114 U/L — ABNORMAL HIGH (ref 0–96)

## 2004-10-22 MED ORDER — PHENERGAN 25 MG OR TABS
ORAL_TABLET | ORAL | Status: AC
Start: 2004-10-22 — End: 2004-10-31

## 2004-10-22 NOTE — Nursing Note (Signed)
>>   Roger [name], CHAU T     10/22/2004   9:15 am  Pt here to discuss:    1/-Stomache X 2 days, a little diarrhea,vomiting,  feeling tired.

## 2004-10-22 NOTE — Progress Notes (Signed)
Addended by: Rick Duff on: 10/23/2004 8:19:57 AM     Modules accepted: Orders      SUBJECTIVE: Here with c/o abdominal pain, nausea, mild back pain, possibly some fevers, diarrhea. Started 2 days ago. No BRBPR, melena, hematemesis, coffee ground emesis. He is not dizzy. Not diabetic. He doesn't use NSAIDS. He drinks about 6 beers/week, had drunk more than usual 2 nights prior to the nausea and pain starting. Nausea started first. No fam h/o gall bladder disease.     Patient Active Problem List:    SPRAIN LUMBAR REGION; work injury [id number][347.4]   HYPERTENSION NOS[401.9]   HYPERCALCEMIA[275.42]   ELEV TRANSAMINASE/LDH[790.4]   FAMILY HX GI MALIGNANCY[V16.0]   PATELLAR TENDINITIS;Right[726.64]    Review of patient's past medical history indicates:   HYPERTENSION NOS 1996    Comment: on 3 drugs, some side effects from medications.    Review of patient's past surgical history indicates:   NO PAST SURGICAL HISTORY     Current outpatient prescriptions:   VIAGRA 100 MG OR TABS, Take 1/2-1 tablet 1 hour before sexual activity, not to exceed 1 tablet per day, Disp: 10  NORVASC 10 MG OR TABS, Take 1 tablet by mouth every day for blood pressure, Disp: 60, Rfl: 3  LISINOPRIL 40 MG OR TABS, 1 TABLET DAILY, Disp: 60, Rfl: 3  PIROXICAM 20 MG OR CAPS, 1 CAPSULE DAILY as needed for back pain, Disp: 30, Rfl: 3    allergies: No Known Drug Allergies, Atenolol    Habits:   Tobacco Use:  Never    OBJECTIVE:    gen'l--pleasant, NAD  Vs--BP 130/90   Pulse 78   Temp (Src) 98 (Oral)   Wt 190 lbs (86.2kg)  Abd--soft, tenderness with voluntary guarding in his epigastric area. No mass.    A/P:    787.01 NAUSEA WITH VOMITING  Note: and diarrhea, likely viral gastroenteritis, although he is quite tender, c/o back pain, would consider pancreatitis, PUD, cholelithiasis.  Plan: COMPREHENSIVE METABOLIC PANEL, AMYLASE, CBC,    DIFF, PHENERGAN 25 MG OR TABS   push fluids. Off work for remainder of week--note written.    789.06 ABDOMINAL  PAIN EPIGASTRIC  Note: as above.   Plan: Check labs today, will arrange for imaging tomorrow if labs significantly abnormal.

## 2004-10-29 ENCOUNTER — Telehealth (INDEPENDENT_AMBULATORY_CARE_PROVIDER_SITE_OTHER): Payer: Self-pay | Admitting: Internal Medicine

## 2004-10-29 NOTE — Telephone Encounter (Signed)
>>   [facility] M LORD Fri Oct 30, 2004 11:16 AM  Spoke with pt. about results. He is feeling a lot better.     >> [name] Fri Oct 30, 2004 10:57 AM  Pt's informed re; below note. Pt have some questions about is amylase and requesting Dr. Shaune Pollack to call him back to explain further. pls call him at work.    >> CHAU T Laveda Norman Thu Oct 29, 2004 12:41 PM  LM on pt's recorder to call us re; below note.      >> Leeroy Cha Oct 29, 2004 12:07 PM  Is he feeling better? One of his lab tests (amylase) was just a little off, which can be explained by vomiting, but I would like to come back in if he's not better. Thanks.     >> Leeroy Cha Oct 29, 2004 12:05 PM  Please call patient.

## 2004-11-11 ENCOUNTER — Ambulatory Visit (INDEPENDENT_AMBULATORY_CARE_PROVIDER_SITE_OTHER): Payer: No Typology Code available for payment source | Admitting: Internal Medicine

## 2004-11-11 VITALS — BP 140/110 | HR 78 | Temp 98.6°F | Wt 191.0 lb

## 2004-11-11 NOTE — Nursing Note (Signed)
>>   TRAN, CHAU T     11/11/2004   9:30 am  Pt here to discuss:    1/-lower right arm pain.

## 2004-11-11 NOTE — Progress Notes (Signed)
Roger [name] is a 43 year old male. Patient presents with:    Musculoskeletal Problem      History of Present Illness:   Was cleaning his shed. Was stuck by a sharp object. It is bruised and is worried about about infection. It is his right forearm. It is painful with lifting.    Htn-is on meds but has not taken these for a few days.    Review of patient's past medical history indicates:   HYPERTENSION NOS 1996    Comment: on 3 drugs, some side effects from medications.    Review of patient's past surgical history indicates:   NO PAST SURGICAL HISTORY    COLONOSCOPY 2003    Comment: [facility], normal    Patient Active Problem List:    SPRAIN LUMBAR REGION; work injury [id number][454.0]   HYPERTENSION NOS[401.9]   HYPERCALCEMIA[275.42]   ELEV TRANSAMINASE/LDH[790.4]   FAMILY HX GI MALIGNANCY[V16.0]   PATELLAR TENDINITIS;Right[726.64]      Current outpatient prescriptions:   VIAGRA 100 MG OR TABS, Take 1/2-1 tablet 1 hour before sexual activity, not to exceed 1 tablet per day, Disp: 10  NORVASC 10 MG OR TABS, Take 1 tablet by mouth every day for blood pressure, Disp: 60, Rfl: 3  LISINOPRIL 40 MG OR TABS, 1 TABLET DAILY, Disp: 60, Rfl: 3  PIROXICAM 20 MG OR CAPS, 1 CAPSULE DAILY as needed for back pain, Disp: 30, Rfl: 3        Review of patient's family history indicates:   Hypertension Mother    Comment: stroke   Cancer Brother    Comment: colon cancer, died age 59.        Tobacco Use: Never    Alcohol Use: Yes 6 oz/week   Comment: 6 beers/week       Allergies:No Known Drug Allergies, Atenolol      ROS:  no fever, nausea  denies any cardiac sxs    Exam:    Gen: healthy, alert, no distress  BP 140/110   Pulse 78   Temp (Src) 98.6 (Oral)   Wt 191 lbs (86.6kg)  Right forearm: with puncture wound that is scabbed over and surrounded by hematoma. no warmth, erythema, induration or pus.   Exam otherwise deferred.    IMPRESSION/PLAN:    884.0 OPEN WOUND ARM MULT/NOS (primary encounter diagnosis)  Note: no evidence of  infection  Plan: rest it for healing.     V06.5 VACCINE FOR TETANUS + DIPHTHERIA  Note:   Plan: TD,88YRS AND GREATER, IMMUNIZATION ADMIN,    SINGLE IMM       401.9 HYPERTENSION NOS  Note: poor control  Plan: take meds. go home now before returning to work and take meds.    f/u prn.

## 2005-02-10 ENCOUNTER — Encounter (INDEPENDENT_AMBULATORY_CARE_PROVIDER_SITE_OTHER): Payer: Self-pay | Admitting: Internal Medicine

## 2005-02-10 NOTE — Progress Notes (Signed)
Records printed by Evonnie Dawes and forwarded to Melodye Ped, MD, MD for release to:    [facility] [address]  [place], [place] [address]    mailed.

## 2005-03-06 ENCOUNTER — Ambulatory Visit (INDEPENDENT_AMBULATORY_CARE_PROVIDER_SITE_OTHER): Payer: No Typology Code available for payment source | Admitting: Internal Medicine

## 2005-03-06 ENCOUNTER — Telehealth (INDEPENDENT_AMBULATORY_CARE_PROVIDER_SITE_OTHER): Payer: Self-pay | Admitting: Internal Medicine

## 2005-03-06 VITALS — BP 162/110 | Temp 99.4°F

## 2005-03-06 LAB — CBC, DIFF
% Basophils: 2 %
% Eosinophils: 0 %
% Lymphocytes: 30 %
% Monocytes: 9 % (ref 0–19)
% Neutrophils: 59 %
Absolute Eosinophil Count: 0.02 10*3/uL (ref 0–0.50)
Absolute Lymphocyte Count: 2.14 10*3/uL (ref 1.00–4.80)
Basophils: 0.14 10*3/uL (ref 0–0.20)
Hematocrit: 40 % (ref 38–50)
Hemoglobin: 13.3 g/dL (ref 13.0–18.0)
MCH: 27.9 pg (ref 27.3–33.6)
MCHC: 33.5 g/dL (ref 32.3–35.7)
MCV: 83 fL (ref 81–98)
Monocytes: 0.64 10*3/uL (ref 0–0.80)
Neutrophils: 4.23 10*3/uL (ref 1.80–7.00)
Platelet Count: 310 10*3/uL (ref 150–400)
RBC: 4.76 mil/uL (ref 4.40–5.60)
WBC: 7.16 10*3/uL (ref 4.3–10.0)

## 2005-03-06 LAB — PR SED RATE,ESR ONSITE: Erythrocyte Sedimentation Rate: 9 mm/hr (ref 0–15)

## 2005-03-06 NOTE — Progress Notes (Signed)
Addended by: Evonnie Dawes on: 03/10/2005 3:09:14 PM     Modules accepted: Orders      Quick Note by: Dan Maker on 03/09/05 at 5:54 AM.    Normal CBC and ESR  ______    Addended by: Damian Leavell) on: 03/08/2005 9:42:26 AM     Modules accepted: Orders      Patient is here with c/o lump on left lateral jaw without injury. Sudden onset this aM without premonitory symptoms. No fever. Mild pain especialy with increased swelling brought on by last meal.    Only medication is Norvasc for HBP and Viagra as needed. NO recentatichoinergics or antihistamines. No dry mouth or lack of saliva.     From the last OV of 11-11-04, Dr Cathlean Sauer   IMPRESSION/PLAN:  884.0 OPEN WOUND ARM MULT/NOS (primary encounter diagnosis)  Note: no evidence of infection  Plan: rest it for healing.   V06.5 VACCINE FOR TETANUS + DIPHTHERIANote:   Plan: TD,74YRS AND GREATER, IMMUNIZATION ADMIN,    SINGLE IMM  401.9 HYPERTENSION NOS  Note: poor control  Plan: take meds. go home now before returning to work and take meds.  f/u prn.    HO periodic medcaition non-compliance. Has been using his NOrvasc for the last 3-4 days but was off last week - had run out.     Review of patient's allergies indicates:    Atenolol    Comment: dizziness    Current outpatient prescriptions:   VIAGRA 100 MG OR TABS, Take 1/2-1 tablet 1 hour before sexual activity, not to exceed 1 tablet per day, Disp: 10  NORVASC 10 MG OR TABS, Take 1 tablet by mouth every day for blood pressure, Disp: 60, Rfl: 3    Patient Active Problem List:    SPRAIN LUMBAR REGION; work injury [id number][454.0]   HYPERTENSION NOS[401.9]   HYPERCALCEMIA[275.42]   ELEV TRANSAMINASE/LDH[790.4]   FAMILY HX GI MALIGNANCY[V16.0]   PATELLAR TENDINITIS;Right[726.64]    EXAM:  BP 162/110  Repeat Left Arm Sitting 160/100  Well. NAD.   HEENT: Pupils equal reactive to light. Sclerae and conjunctivae clear. Left parotid area swollen to etwice normal without tenderness.   External ears and canals clear.  TM's normal bilaterally.   Nose normal without lesions or discharge. No sinus tenderness is appreciated.   Oropharynx normal with normal tongue and palate. Gums and dentition normal. No palplable mass in the left Stimptson's duct.  NECK: supple, without palpable adenopathy. No thyroid enlargemant or discrete nodule noted at this time.    UP TO DATE - rReviewed for Sialolithiasis    ASSESSMENT:  Sialolithiasis  Uncontrolled Hypertension    PLAN:  Oral FLuids, Heat, ANalgesia, Lemon wedges  CT Parotid - first of week looking for stone.   Re-emphasize constant BP med routine,   INformation and instruction provided to patient.  FU with Dr D in one week.

## 2005-03-06 NOTE — Nursing Note (Signed)
>>   Roger [name], Roger [name]     03/06/2005   2:24 pm  Patient is here with c/o lump on jaw without injury

## 2005-03-06 NOTE — Telephone Encounter (Signed)
>>   PATRICIA LYNN HILL Sat Mar 06, 2005 11:58 AM  Phone call to patient re; increased swelling of a lump on his jaw line. Patient states that this lump is gradually swelling as the day goes on and does not know what this is. I scheduled patient with Dr Elease Hashimoto for review today at 2:20. done    >> MARK J HOBBS Sat Mar 06, 2005 9:56 AM  TRIAGE/ADVICE  -----------  PCP: Melodye Ped, MD, MD  Main reason for calling is: Pt says he has lump/swelling on jaw under ear. Pt states he was at work today and couldn't make it it.  How long has this episode lasted: n/a  Preferred phone number:   Mobile [phone]  Okay to leave VM or msg with someone? YES

## 2005-03-06 NOTE — Patient Instructions (Signed)
Recommendations     Patients should keep well hydrated, apply moist heat to the involved area, massage the gland, "milk" the duct, and suck on fresh lemon wedges or tart hard candies such as lemon drops to promote salivary flow - every 2-3 hours.     Pain should be controlled with NSAIDs - IBUPRFEN 200mg  3 tabs rour times a day for two weeks.     If infection is suspected because of increasing pain, fever, or purulent drainage from the duct, antistaphylococcal antibiotics may be needed.     Referral for specialist care should generally be driven by symptoms. Indications for nonurgent referral include persistent symptoms lasting more than a few days, frequently recurrent symptoms, or recurrent episodes of infection. Infection that worsens or does not respond to antibiotic therapy within 7 to 10 days requires urgent referral.     For patients who do not spontaneously pass a stone, and in whom transoral removal of a stone is not possible, lithotripsy, wire basket retrieval, and sialoendoscopy are all reasonable options depending upon stone characteristics, location, and availability of the procedures; some of these therapies are not widely available. There are patients in whom gland removal is the most appropriate course of action.

## 2005-03-08 ENCOUNTER — Encounter (INDEPENDENT_AMBULATORY_CARE_PROVIDER_SITE_OTHER): Payer: Self-pay | Admitting: Internal Medicine

## 2005-03-08 NOTE — Progress Notes (Signed)
Quick Note by: Dan Maker on 03/10/05 at 7:00 AM.    Normal CT face and left parotid    PLAN:  Needs Clinical Correlation  ______

## 2005-03-09 LAB — PR CT MAXILLOFACIAL W/O & W/CONTRAST MATERIAL

## 2005-03-12 ENCOUNTER — Ambulatory Visit (INDEPENDENT_AMBULATORY_CARE_PROVIDER_SITE_OTHER): Payer: Self-pay | Admitting: Internal Medicine

## 2005-03-12 ENCOUNTER — Ambulatory Visit (INDEPENDENT_AMBULATORY_CARE_PROVIDER_SITE_OTHER): Payer: No Typology Code available for payment source | Admitting: Internal Medicine

## 2005-03-12 VITALS — BP 142/102

## 2005-03-12 NOTE — Progress Notes (Signed)
Patient is here for a one week follow up of his sialolithiasis and blood pressure.    within 48 hours of visit , left parotid gland receded without pain or fever. Now back to normal. CT Performed.    From the last OV of 03-06-05, my   ASSESSMENT:  Sialolithiasis  Uncontrolled Hypertension  PLAN:  Oral Fluids, Heat, Analgesia, Lemon wedges  CT Parotid - first of week looking for stone.   Re-emphasize constant BP med routine,   Information and instruction provided to patient.  FU with Dr D in one week.    Other concern is his previous colonoscopy in 2003 with inadequate prep and need for repeat in 3 years. Brother died in the last year of Colon cancer age 45! Patient without bowel symptoms. Relates that he had difficulty in having insurance cover the cost - but it was resolved with appeal.     Review of patient's allergies indicates:    Atenolol    Comment: dizziness    Current outpatient prescriptions:   VIAGRA 100 MG OR TABS, Take 1/2-1 tablet 1 hour before sexual activity, not to exceed 1 tablet per day, Disp: 10  NORVASC 10 MG OR TABS, Take 1 tablet by mouth every day for blood pressure, Disp: 60, Rfl: 3    Patient Active Problem List:    SPRAIN LUMBAR REGION; work injury [id number][454.0]   HYPERTENSION NOS[401.9]   HYPERCALCEMIA[275.42]   ELEV TRANSAMINASE/LDH[790.4]   FAMILY HX GI MALIGNANCY[V16.0]   PATELLAR TENDINITIS;Right[726.64]    EXAM:  BP 142/102  HEENT: Pupils equal reactive to light. Sclerae and conjunctivae clear.   External ears and canals clear. TM's normal bilaterally.   Face without swelling; parotids normal without mass or tenderness  Nose normal without lesions or discharge. No sinus tenderness is appreciated.   Oropharynx normal with normal tongue and palate. Gums and dentition normal. Normal Stenson's duct left.   NECK: supple, without palpable adenopathy. No thyroid enlargemant or discrete nodule noted at this time.    ORCA Reviewed and Colonoscopy study uploaded to consult and into EMR.    CT  HEAD Impression: Normal CT of the max-face. Normal bilateral parotid glands.    ASSESSMENT:  Resolved sialolithiasis/adenitis  Family HO Colon Cancer    PLAN:  Continue to observe parotids  ENt if rrecurrent swelling  Colonoscopy referral for High risk candidate

## 2005-03-12 NOTE — Nursing Note (Signed)
>>   Roger [name], Roger [name]     03/12/2005   11:58 am  Patient is here for a one week follow up of his sialolithiasis and blood pressure.

## 2005-05-03 ENCOUNTER — Ambulatory Visit (HOSPITAL_BASED_OUTPATIENT_CLINIC_OR_DEPARTMENT_OTHER): Payer: Self-pay

## 2005-06-25 ENCOUNTER — Telehealth (INDEPENDENT_AMBULATORY_CARE_PROVIDER_SITE_OTHER): Payer: Self-pay | Admitting: Internal Medicine

## 2005-06-25 MED ORDER — NORVASC 10 MG OR TABS
ORAL_TABLET | ORAL | Status: DC
Start: 2005-06-25 — End: 2008-07-08

## 2005-06-25 NOTE — Telephone Encounter (Signed)
>>   Roger [name] Fri Jun 25, 2005 4:20 PM  Fax received from pharmacy requesting refill.  1-Name of requested medication: Norvasc  2-Class of medication requested: Blood Pressure Medication  3-Date last refilled: 05/18/05  4-Date of last office visit: 03/12/05  5-Next office visit (scheduled or expected): none scheduled

## 2005-07-31 ENCOUNTER — Telehealth (INDEPENDENT_AMBULATORY_CARE_PROVIDER_SITE_OTHER): Payer: Self-pay | Admitting: Internal Medicine

## 2005-07-31 MED ORDER — VIAGRA 100 MG OR TABS
ORAL_TABLET | ORAL | Status: DC
Start: 2005-07-31 — End: 2006-08-30

## 2005-07-31 NOTE — Telephone Encounter (Signed)
>>   [facility] THOEUN Sat Jul 31, 2005 2:06 PM  Approved Prescriptions: Disp Refills   VIAGRA 100 MG OR TABS 10 5    Sig: Take 1/2-1 tablet 1 hour before sexual activity,    not to exceed 1 tablet per day   Authorizing Provider: Melodye Ped   Ordering User: Rudean Curt    Confirmed fax to pharmacy.      >> [facility] Sat Jul 31, 2005 9:17 AM  Received fax from [facility] for refill request.    Lov:03/12/05 last refill:05/18/05 No future ov schedule.

## 2005-12-02 ENCOUNTER — Ambulatory Visit (INDEPENDENT_AMBULATORY_CARE_PROVIDER_SITE_OTHER): Payer: No Typology Code available for payment source | Admitting: Physician Assistant

## 2006-04-22 ENCOUNTER — Telehealth (INDEPENDENT_AMBULATORY_CARE_PROVIDER_SITE_OTHER): Payer: Self-pay | Admitting: Internal Medicine

## 2006-04-22 NOTE — Telephone Encounter (Signed)
>>   [name] Fri Apr 22, 2006 5:01 PM  Pt's notified that we do have electronic appointment reminder, also asked pt to write his appt. On the calendar to remind himself.     >> Lisette Grinder Fri Apr 22, 2006 2:39 PM  GENERAL MESSAGE  ------------------  PCP: Melodye Ped, MD  Main reason for the call: Pt sts he wants to make sure gets a reminder call for his 5/31 appnt.  Call back expected: YES  Daytime call back number: [phone] After 5: [phone] Saturday: [phone]   Okay to leave detailed VM or msg with someone at any of these numbers: YES

## 2006-05-12 ENCOUNTER — Ambulatory Visit (INDEPENDENT_AMBULATORY_CARE_PROVIDER_SITE_OTHER): Payer: No Typology Code available for payment source | Admitting: Internal Medicine

## 2006-05-20 ENCOUNTER — Ambulatory Visit (INDEPENDENT_AMBULATORY_CARE_PROVIDER_SITE_OTHER): Payer: No Typology Code available for payment source | Admitting: Internal Medicine

## 2006-05-20 NOTE — Progress Notes (Signed)
Patient no show with Dr Lauinger

## 2006-08-30 ENCOUNTER — Telehealth (INDEPENDENT_AMBULATORY_CARE_PROVIDER_SITE_OTHER): Payer: Self-pay | Admitting: Internal Medicine

## 2006-08-30 MED ORDER — VIAGRA 100 MG OR TABS
ORAL_TABLET | ORAL | Status: DC
Start: 2006-08-30 — End: 2007-09-29

## 2006-08-30 NOTE — Telephone Encounter (Signed)
Pharmacy requesting refill for Viagra. LAST OV 05-20-06. Last rx dispense on 06-03-06 for #10.

## 2007-09-29 ENCOUNTER — Telehealth (INDEPENDENT_AMBULATORY_CARE_PROVIDER_SITE_OTHER): Payer: Self-pay | Admitting: Internal Medicine

## 2007-09-29 MED ORDER — VIAGRA 100 MG OR TABS
ORAL_TABLET | ORAL | Status: DC
Start: 2007-09-29 — End: 2012-04-28

## 2007-09-29 NOTE — Telephone Encounter (Signed)
[facility] faxed over refill request for patient. Lov-03/12/05. Please advise. Helaine Chess

## 2008-04-24 ENCOUNTER — Ambulatory Visit: Payer: Self-pay | Admitting: Internal Medicine

## 2008-05-28 ENCOUNTER — Ambulatory Visit: Payer: Self-pay | Admitting: Internal Medicine

## 2008-06-17 ENCOUNTER — Encounter (INDEPENDENT_AMBULATORY_CARE_PROVIDER_SITE_OTHER): Payer: Self-pay | Admitting: Internal Medicine

## 2008-06-17 ENCOUNTER — Ambulatory Visit (INDEPENDENT_AMBULATORY_CARE_PROVIDER_SITE_OTHER): Payer: No Typology Code available for payment source | Admitting: Internal Medicine

## 2008-07-08 ENCOUNTER — Ambulatory Visit (INDEPENDENT_AMBULATORY_CARE_PROVIDER_SITE_OTHER): Payer: No Typology Code available for payment source | Admitting: Internal Medicine

## 2008-07-08 VITALS — BP 130/82 | HR 76 | Temp 97.7°F | Resp 18 | Wt 192.0 lb

## 2008-07-08 LAB — PR U/A AUTO DIPSTICK ONLY, ONSITE
Bilirubin (Indirect): NEGATIVE
Glucose: NEGATIVE mg/dL
Ketones, URN: NEGATIVE mg/dL
Leukocytes: NEGATIVE
Nitrite, URN: NEGATIVE
Occult Blood, URN: NEGATIVE
Protein: NEGATIVE mg/dL
Specific Gravity, URN: 1.01 (ref 1.005–1.030)
Urobilinogen, URN: NORMAL E.U./dL
pH, Whole Blood: 7.5 (ref 5.0–8.0)

## 2008-07-08 MED ORDER — AZOR 5-40 MG OR TABS
ORAL_TABLET | ORAL | Status: DC
Start: 2008-07-08 — End: 2009-08-04

## 2008-07-08 MED ORDER — NAPROXEN 500 MG OR TBEC
DELAYED_RELEASE_TABLET | ORAL | Status: DC
Start: 2008-07-08 — End: 2009-08-19

## 2008-07-08 NOTE — Progress Notes (Signed)
Patient comes in today for several issues:    1. HTN follow up. On Azor, BP good. Need refill.    2. Scrotal sometimes gets sore. No mass. Ongoing, intermittently for 2 weeks. No discharge, no STD's risks.  ? New exercise, patient quit. No hernia.    3. R foot, ongoing for 1 month, no direct injury. Does trendmill, able to run . Mainly in the in the heel, no trauma, worst first few steps then better, also worse after being of feet a blood.    4. Urinary frequency, no dysuria. No fever, intermittently, for years, no problem with flow.      Patient Active Problem List   Diagnoses Code   . SPRAIN LUMBAR REGION;  work injury [id number] 847.2   . HYPERTENSION NOS 401.9   . HYPERCALCEMIA 275.42   . ELEV TRANSAMINASE/LDH 790.4   . FAMILY HX GI MALIGNANCY V16.0   . PATELLAR TENDINITIS;Right 726.64            Current outpatient prescriptions: AZOR 5-40 MG OR TABS, take 1 tablet by mouth once daily, Disp: 0, Rfl: 0;  VIAGRA 100 MG OR TABS, Take 1/2-1 tablet 1 hour before sexual activity, not to exceed 1 tablet per day, Disp: 10, Rfl: 5;  NORVASC 10 MG OR TABS, Take 1 tablet by mouth every day for blood pressure, Disp: 60, Rfl: 3    History   Substance Use Topics   . Tobacco Use: Never   . Alcohol Use: 6.0 oz/week      6 beers/week      Review of Systems -   CV, general, MS, GU as above; except for above the rest of the review of system is negative/normal.      Physical Examination:  BP 130/82  Pulse 76  Temp(Src) 97.7 F (36.5 C) (Tympanic)  Resp 18  Wt 192 lb (87.091 kg)  General: Awake, alert, not in apparent distress.  HEENT: Conjuctivae clear, PERRL  Lungs: Clear to auscultation  Heart: RRR, S1/2 normal, no S3/4  Abdomen: NABS, soft, non-distended, non-tender, no hernia noted. No CVAT  Extremities: No edema, cyanosis or clubbing, no gross sign of fracture, no swelling, slight tenderness on the plantar aspect in the R heel  Scrotal no abnormal lesion noted, no abnormal testicular lesion noted, no  tenderness.    ASSESSMENT and PLAN:  401.1 BENIGN HYPERTENSION  (primary encounter diagnosis)  Comment: continue azor for now, Discussed medication dosage, usage, goals of therapy, and side effects.   Plan: AZOR 5-40 MG OR TABS, BASIC METABOLIC PANEL,         LIPID PANEL        Monitor BP.  HTN discussed.    788.41 URINARY FREQUENCY  Comment: Discussed with patient in length, questions answered, patient verbalized understanding.   Plan: U/A AUTO DIPSTICK ONLY        UA discussed  Check glucose.    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   Plan: NAPROXEN 500 MG OR TBEC        Discussed medication dosage, usage, goals of therapy, and side effects.   Exercises as discussed.    f/u in 2 weeks, sooner if needed.  Call if signs and symptoms discussed occurs or if with problem or questions.

## 2008-07-08 NOTE — Progress Notes (Signed)
Pt presents today for f/u on BP. Pt concerns of sore heels and testicle sore(frequency urination).

## 2008-07-15 ENCOUNTER — Other Ambulatory Visit (INDEPENDENT_AMBULATORY_CARE_PROVIDER_SITE_OTHER): Payer: No Typology Code available for payment source

## 2008-07-16 ENCOUNTER — Other Ambulatory Visit (INDEPENDENT_AMBULATORY_CARE_PROVIDER_SITE_OTHER): Payer: No Typology Code available for payment source

## 2008-07-16 LAB — LIPID PANEL
Cholesterol/HDL Ratio: 2.8
HDL Cholesterol: 59 mg/dL (ref >40)
Non-HDL Cholesterol: 109 mg/dL (ref 0–159)
Total Cholesterol: 168 mg/dL (ref <200)
Triglyceride: 480 mg/dL — ABNORMAL HIGH (ref <150)

## 2008-07-16 LAB — BASIC METABOLIC PANEL
Anion Gap: 9 (ref 3–11)
Calcium: 9.5 mg/dL (ref 8.9–10.2)
Carbon Dioxide, Total: 26 mEq/L (ref 22–32)
Chloride: 106 mEq/L (ref 98–108)
Creatinine: 0.9 mg/dL (ref 0.2–1.1)
GFR, Calc, African American: >60 mL/min (ref >59)
GFR, Calc, European American: >60 mL/min (ref >59)
Glucose: 108 mg/dL (ref 62–125)
Potassium: 4.6 mEq/L (ref 3.7–5.2)
Sodium: 141 mEq/L (ref 136–145)
Urea Nitrogen: 13 mg/dL (ref 8–21)

## 2008-07-22 ENCOUNTER — Ambulatory Visit (INDEPENDENT_AMBULATORY_CARE_PROVIDER_SITE_OTHER): Payer: No Typology Code available for payment source | Admitting: Internal Medicine

## 2008-07-22 VITALS — BP 150/90 | HR 74 | Resp 18 | Wt 187.0 lb

## 2008-07-22 NOTE — Progress Notes (Signed)
Patient comes in today for follow up. Patient reported that his urination is normal, foot is better, scrotum normal. No prolem with naproxen.    Lab discussed. Diet discussed.  Taking azor, dieting lost weight.    Patient currently without complain.    Review of Systems -   General, CV, GU as above; except for above the rest of the review of system is negative/normal.      Patient Active Problem List   Diagnoses Code   . SPRAIN LUMBAR REGION;  work injury [id number] 847.2   . HYPERTENSION NOS 401.9   . HYPERCALCEMIA 275.42   . ELEV TRANSAMINASE/LDH 790.4   . FAMILY HX GI MALIGNANCY V16.0   . PATELLAR TENDINITIS;Right 726.64            Current outpatient prescriptions: NAPROXEN 500 MG OR TBEC, 1 TABLET TWICE DAILY, Disp: 30, Rfl: 0;  AZOR 5-40 MG OR TABS, take 1 tablet by mouth once daily, Disp: 30, Rfl: 5;  VIAGRA 100 MG OR TABS, Take 1/2-1 tablet 1 hour before sexual activity, not to exceed 1 tablet per day, Disp: 10, Rfl: 5    History   Substance Use Topics   . Tobacco Use: Never   . Alcohol Use: 6.0 oz/week      6 beers/week          Physical Examination:  BP 150/90  Pulse 74  Resp 18  Wt 187 lb (84.823 kg)  BP 140/80  General: Awake, alert, not in apparent distress.  HEENT: Conjuctivae clear, PERRL  Lungs: Clear to auscultation  Heart: RRR, S1/2 normal, no S3/4  Abdomen: NABS, soft, non-distended, non-tender  Extremities: No edema, cyanosis or clubbing    ASSESSMENT and PLAN:  272.4 HYPERLIPIDEMIA NEC/NOS  (primary encounter diagnosis)  401.9 HYPERTENSION NOS  Comment: Discussed with patient in length, questions answered, patient verbalized understanding.   Plan: LIPID PANEL, HEPATIC FUNCTION PANEL        TLC, diet etc discussed.  Monitor BP  f/u in 2 months, sooner if needed.  Call if signs and symptoms discussed occurs, or previous symptoms recurs or if with problem or questions.

## 2008-07-22 NOTE — Progress Notes (Signed)
Pt presents today for f/u on lab results.

## 2008-07-31 ENCOUNTER — Ambulatory Visit: Payer: Self-pay | Admitting: Internal Medicine

## 2008-08-06 ENCOUNTER — Other Ambulatory Visit: Payer: Self-pay

## 2008-08-06 ENCOUNTER — Inpatient Hospital Stay: Payer: Self-pay | Admitting: Internal Medicine

## 2008-12-07 ENCOUNTER — Encounter (INDEPENDENT_AMBULATORY_CARE_PROVIDER_SITE_OTHER): Payer: Self-pay | Admitting: Internal Medicine

## 2008-12-26 ENCOUNTER — Ambulatory Visit (INDEPENDENT_AMBULATORY_CARE_PROVIDER_SITE_OTHER): Payer: No Typology Code available for payment source | Admitting: Internal Medicine

## 2008-12-30 ENCOUNTER — Ambulatory Visit (INDEPENDENT_AMBULATORY_CARE_PROVIDER_SITE_OTHER): Payer: No Typology Code available for payment source | Admitting: Internal Medicine

## 2008-12-30 VITALS — BP 162/100 | HR 76 | Temp 96.8°F | Resp 18 | Wt 195.0 lb

## 2008-12-30 MED ORDER — AMLODIPINE BESYLATE 10 MG OR TABS
ORAL_TABLET | ORAL | Status: DC
Start: 2008-12-30 — End: 2009-02-07

## 2008-12-30 MED ORDER — PROCTOSOL HC 2.5 % RE CREA
TOPICAL_CREAM | RECTAL | Status: DC
Start: 2008-12-30 — End: 2009-08-19

## 2008-12-30 NOTE — Progress Notes (Signed)
Pt here concerns of a lump on anus; BP f/u.

## 2008-12-30 NOTE — Progress Notes (Signed)
Patient comes in today for follow up.    1. Patient run out of azor 2 days ago, would like to try something generic for cost purposes.    2. Lump in rectum, noticed 2 weeks ago, history of hemorrhoid before. No bleeding, no melena, no abdominal pain.    3. Lipid, patient did not do his lab yet. No CP.    4. Chronic low back pain, seeing chiropractor which help. No weakness, no numbness.    Review of Systems -   General, GI, CV, MS, Neuro as above; except for above the rest of the review of system is negative/normal.    Patient Active Problem List   Diagnoses Code   . SPRAIN LUMBAR REGION;  work injury [id number] 847.2   . HYPERTENSION NOS 401.9   . HYPERCALCEMIA 275.42   . ELEV TRANSAMINASE/LDH 790.4   . FAMILY HX GI MALIGNANCY V16.0   . PATELLAR TENDINITIS;Right 726.64            Current outpatient prescriptions: AZOR 5-40 MG OR TABS, take 1 tablet by mouth once daily, Disp: 30, Rfl: 5;  NAPROXEN 500 MG OR TBEC, 1 TABLET TWICE DAILY, Disp: 30, Rfl: 0;  VIAGRA 100 MG OR TABS, Take 1/2-1 tablet 1 hour before sexual activity, not to exceed 1 tablet per day, Disp: 10, Rfl: 5    History   Substance Use Topics   . Tobacco Use: Never   . Alcohol Use: 6.0 oz/week      6 beers/week          Physical Examination:  BP 162/100  Pulse 76  Temp(Src) 96.8 F (36 C) (Temporal)  Resp 18  Wt 195 lb (88.451 kg)  General: Awake, alert, not in apparent distress.  HEENT: Conjuctivae clear, PERRL  Lungs: Clear to auscultation  Heart: RRR, S1/2 normal, no S3/4  Abdomen: NABS, soft, non-distended, non-tender, no CVAT.  Extremities: No edema, cyanosis or clubbing  Rectal exam: external non-thrombosed hemorrhoid noted in the 3 o'clock  Back: No spine tenderness, no paralumbar tenderness.    ASSESSMENT and PLAN:  401.9 HYPERTENSION NOS  (primary encounter diagnosis)  Comment: Discussed with patient in length, questions answered, patient verbalized understanding.   Plan: AMLODIPINE BESYLATE 10 MG OR TABS        Discussed medication dosage,  usage, goals of therapy, and side effects.   TLC discussed.    272.4 HYPERLIPIDEMIA NEC/NOS  Comment: Discussed with patient in length, questions answered, patient verbalized understanding.   Plan:   Patient to do lab.    455.6 HEMORRHOIDS NOS  Comment: Discussed with patient in length, questions answered, patient verbalized understanding.   Plan: PROCTOSOL HC 2.5 % RE CREA        Discussed medication dosage, usage, goals of therapy, and side effects.   Diet discussed.    724.2 LOW BACK PAIN(LUMBAGO)  Comment: Discussed with patient in length, questions answered, patient verbalized understanding.   Plan:   Call if signs and symptoms discussed occurs or if with problem or questions.  Chiropractor tx for now.    f/u in 2 weeks, sooner if needed.  Call if signs and symptoms discussed occurs or if with problem or questions.  I spent 30 minutes with patient, more than 50% of the service spent in counseling and coordinating care.

## 2009-01-10 ENCOUNTER — Other Ambulatory Visit (INDEPENDENT_AMBULATORY_CARE_PROVIDER_SITE_OTHER): Payer: No Typology Code available for payment source

## 2009-01-14 ENCOUNTER — Ambulatory Visit (INDEPENDENT_AMBULATORY_CARE_PROVIDER_SITE_OTHER): Payer: No Typology Code available for payment source | Admitting: Internal Medicine

## 2009-02-07 ENCOUNTER — Other Ambulatory Visit (INDEPENDENT_AMBULATORY_CARE_PROVIDER_SITE_OTHER): Payer: Self-pay | Admitting: Internal Medicine

## 2009-02-07 MED ORDER — AMLODIPINE BESYLATE 10 MG OR TABS
ORAL_TABLET | ORAL | Status: DC
Start: 2009-02-07 — End: 2009-05-13

## 2009-02-07 NOTE — Telephone Encounter (Signed)
Rx refilled and faxed. Thank you.

## 2009-02-07 NOTE — Telephone Encounter (Signed)
USED IF Rx NOT FOUND, DOSE IS DIFFERENT, OR IF DOESN'T MATCH WHAT IS IN EPIC.    RFC: Med Management  Provider name on bottle: Dr. Park Breed  PCP: Melodye Ped, MD  Medication(s) requested: A medication for blood pressure  Dose taking now: N/A  Reason for request: The pt stated that he called the pharmacy for a refill a week ago, and the pharmacy has sent a fax to Dr. Park Breed twice for authorization, receiving no response yet. The pt stated that he has been out of this medication for a week and is concerned because of the lack of response from the clinic to get this order through.  Written prescription needed? NO  Pharmacy name: See below  Pharmacy location:  See below  Pharmacy phone #:  See below  Okay to leave detailed VM or message with someone at any of these numbers? YES  TSR - Please inform patient that refills typically take 1-2 business days and to call their pharmacy to verify status. N/A

## 2009-02-07 NOTE — Telephone Encounter (Signed)
Left message that medication has been refilled. DG

## 2009-03-19 ENCOUNTER — Telehealth (INDEPENDENT_AMBULATORY_CARE_PROVIDER_SITE_OTHER): Payer: Self-pay | Admitting: Internal Medicine

## 2009-03-19 NOTE — Telephone Encounter (Signed)
Forward to provider to advise and place order if ok'd. No recent PSA test done. Helaine Chess

## 2009-03-19 NOTE — Telephone Encounter (Signed)
Relayed Dr. lauinger's below detail message to patient.  He will call back to schedule a f/u with Dr. Park Breed after he speaks with his wife.

## 2009-03-19 NOTE — Telephone Encounter (Signed)
ORDER/TEST REQUESTED    Provider who should order test. Dr. Park Breed   Requests an order for:  PSA test  Reason for request: Patient called and wanted to know if the doctor performed a PSA test on his 12/30/2008 visit. Patient stated that if the PSA test was not performed he would like to have it.   Callback expected:YES  Okay to leave a VM or msg with anyone at this number? YES  TSR-inform pt their call will be returned within 2 business days

## 2009-03-19 NOTE — Telephone Encounter (Signed)
He should  Schedule an appointment.  We are supposed to discuss pro/con of PSA with patient before ordering.  Needs follow up on blood pressure, which was not controlled at his January visit.   He is also over due for colonoscopy (done 2003 with repeat recommended in 3 years, has been 7 years) and we discuss this with him and try to arrange.

## 2009-05-13 ENCOUNTER — Other Ambulatory Visit (INDEPENDENT_AMBULATORY_CARE_PROVIDER_SITE_OTHER): Payer: Self-pay | Admitting: Internal Medicine

## 2009-05-13 NOTE — Telephone Encounter (Signed)
Pt of Dr. Park Breed

## 2009-05-13 NOTE — Telephone Encounter (Signed)
Medication Refill Documentation    Name of Medication: Amlodipine besylate 10mg     Prescribing provider:  Hadassah Pais, M.D.    Protocol used (by medication type, not necessarily patient diagnosis): Adult:  BLOOD PRESSURE - OK to refill if seen in last 6 months for this issue. Fill amount up to their next 6 month follow-up appointment and schedule next appointment.  Patient must be seen every 6 months.     POTASSIUM (mEq/L)   Date Value   07/16/08 4.6           Date last filled: 04/12/2009    Date last seen for this issue:  12/30/2008      Last 2 Encounter BP Readings:   Date BP   12/30/2008 162/100   07/22/2008 150/90           Next scheduled appointment date:  Visit date not found

## 2009-05-15 NOTE — Telephone Encounter (Signed)
Pt states he is out.

## 2009-05-16 MED ORDER — AMLODIPINE BESYLATE 10 MG OR TABS
ORAL_TABLET | ORAL | Status: DC
Start: 2009-05-13 — End: 2009-06-11

## 2009-05-16 NOTE — Telephone Encounter (Signed)
Scheduled

## 2009-05-16 NOTE — Telephone Encounter (Signed)
Rx refilled for 1 month and faxed. Please advise patient to follow up before the next refill. Thank you.

## 2009-05-20 ENCOUNTER — Ambulatory Visit (INDEPENDENT_AMBULATORY_CARE_PROVIDER_SITE_OTHER): Payer: Self-pay | Admitting: Internal Medicine

## 2009-06-11 ENCOUNTER — Telehealth (INDEPENDENT_AMBULATORY_CARE_PROVIDER_SITE_OTHER): Payer: Self-pay | Admitting: Internal Medicine

## 2009-06-11 MED ORDER — AMLODIPINE BESYLATE 10 MG OR TABS
ORAL_TABLET | ORAL | Status: DC
Start: 2009-06-11 — End: 2009-06-24

## 2009-06-11 NOTE — Telephone Encounter (Signed)
Pt was advised last month, canceled appt, will rx for one week with directions, no refills until scheduled.

## 2009-06-24 ENCOUNTER — Ambulatory Visit (INDEPENDENT_AMBULATORY_CARE_PROVIDER_SITE_OTHER): Payer: No Typology Code available for payment source | Admitting: Women's Health

## 2009-06-24 ENCOUNTER — Other Ambulatory Visit (INDEPENDENT_AMBULATORY_CARE_PROVIDER_SITE_OTHER): Payer: Self-pay | Admitting: Internal Medicine

## 2009-06-24 MED ORDER — AMLODIPINE BESYLATE 10 MG OR TABS
ORAL_TABLET | ORAL | Status: DC
Start: 2009-06-24 — End: 2009-07-28

## 2009-06-24 NOTE — Telephone Encounter (Signed)
Last OV 12-30-08

## 2009-06-24 NOTE — Telephone Encounter (Signed)
Patient is about out of medication.  Pt had to cancel his appointment for tomorrow due to a work conflict.  Pt asking for a 1 month refill.

## 2009-06-24 NOTE — Telephone Encounter (Signed)
Spoke w/ pt, he'll call back to reschedule his appt.

## 2009-06-24 NOTE — Telephone Encounter (Signed)
Please help him reschedule

## 2009-07-28 ENCOUNTER — Other Ambulatory Visit (INDEPENDENT_AMBULATORY_CARE_PROVIDER_SITE_OTHER): Payer: Self-pay | Admitting: Internal Medicine

## 2009-07-28 MED ORDER — AMLODIPINE BESYLATE 10 MG OR TABS
ORAL_TABLET | ORAL | Status: DC
Start: 2009-07-28 — End: 2009-08-04

## 2009-07-28 NOTE — Telephone Encounter (Signed)
Rx refilled and faxed. Thank you.

## 2009-07-28 NOTE — Telephone Encounter (Signed)
Pt's scheduled for BP med f/up on 07/29/09 3:20pm; he asked if he could get Rx called in prior to then, as he's been out for a few days.

## 2009-07-29 ENCOUNTER — Ambulatory Visit (INDEPENDENT_AMBULATORY_CARE_PROVIDER_SITE_OTHER): Payer: No Typology Code available for payment source | Admitting: Internal Medicine

## 2009-08-04 ENCOUNTER — Encounter (INDEPENDENT_AMBULATORY_CARE_PROVIDER_SITE_OTHER): Payer: Self-pay | Admitting: Internal Medicine

## 2009-08-04 ENCOUNTER — Ambulatory Visit (INDEPENDENT_AMBULATORY_CARE_PROVIDER_SITE_OTHER): Payer: No Typology Code available for payment source | Admitting: Internal Medicine

## 2009-08-04 VITALS — BP 130/90 | HR 76 | Resp 18 | Wt 190.0 lb

## 2009-08-04 MED ORDER — AMLODIPINE BESYLATE 10 MG OR TABS
ORAL_TABLET | ORAL | Status: DC
Start: 2009-08-04 — End: 2009-10-30

## 2009-08-04 MED ORDER — OMEPRAZOLE 20 MG OR CPDR
DELAYED_RELEASE_CAPSULE | ORAL | Status: DC
Start: 2009-08-04 — End: 2009-09-30

## 2009-08-04 NOTE — Progress Notes (Signed)
Patient comes in today for follow up. Patient did not follow up as previously recommended.  Reflux and epigastric discomfort. Certain food triggers it.  No melena, no hematochezia.  No CP, no paplitation.\    Review of Systems -   General, GI, CV as above; except for above the rest of the review of system is negative/normal.    Patient Active Problem List   Diagnoses Code   . SPRAIN LUMBAR REGION;  work injury [id number] 847.2   . HYPERTENSION NOS 401.9   . HYPERCALCEMIA 275.42   . ELEV TRANSAMINASE/LDH 790.4   . FAMILY HX GI MALIGNANCY V16.0   . PATELLAR TENDINITIS;Right 726.64            Current outpatient prescriptions: AmLODIPine Besylate 10 MG OR TABS, Take 1 tablet by mouth daily, Disp: 30, Rfl: 1;  NAPROXEN 500 MG OR TBEC, 1 TABLET TWICE DAILY, Disp: 30, Rfl: 0;  PROCTOSOL HC 2.5 % RE CREA, apply to affected area twice daily  prn, Disp: 1, Rfl: 1;  VIAGRA 100 MG OR TABS, Take 1/2-1 tablet 1 hour before sexual activity, not to exceed 1 tablet per day, Disp: 10, Rfl: 5    History   Substance Use Topics   . Tobacco Use: Never   . Alcohol Use: 6.0 oz/week      6 beers/week        Physical Examination:  BP 130/90  Pulse 76  Resp 18  Wt 190 lb (86.183 kg)  General: Awake, alert, not in apparent distress.  HEENT: Conjuctivae clear, PERRL  Lungs: Clear to auscultation  Heart: RRR, S1/2 normal, no S3/4  Abdomen: NABS, soft, non-distended, non-tender, negative Murphy's  Extremities: No edema, cyanosis or clubbing    ASSESSMENT and PLAN:  401.9 HYPERTENSION NOS  (primary encounter diagnosis)  Comment: Discussed with patient in length, questions answered, patient verbalized understanding.   Plan: AMLODIPINE BESYLATE 10 MG OR TABS        Discussed medication dosage, usage, goals of therapy, and side effects.     272.4 HYPERLIPIDEMIA NEC/NOS  Comment: Discussed with patient in length, questions answered, patient verbalized understanding.   Plan: LIPID PANEL, COMPREHENSIVE METABOLIC PANEL        Diet and TLC (therapeutic  lifestyle changes) discussed.      530.81 ESOPHAGEAL REFLUX  Comment: Discussed with patient in length, questions answered, patient verbalized understanding.   Plan: OMEPRAZOLE 20 MG OR CPDR        Discussed medication dosage, usage, goals of therapy, and side effects.     f/u in 2 weeks, sooner if needed.  Call if signs and symptoms discussed occurs or if with problem or questions.

## 2009-08-04 NOTE — Progress Notes (Signed)
Pt presents today for f/u on BP,meds. Pt also concerns of stomache sometimes.

## 2009-08-07 ENCOUNTER — Encounter (INDEPENDENT_AMBULATORY_CARE_PROVIDER_SITE_OTHER): Payer: Self-pay | Admitting: Internal Medicine

## 2009-08-07 ENCOUNTER — Other Ambulatory Visit (INDEPENDENT_AMBULATORY_CARE_PROVIDER_SITE_OTHER): Payer: No Typology Code available for payment source

## 2009-08-07 LAB — COMPREHENSIVE METABOLIC PANEL
ALT (GPT): 53 U/L (ref 10–64)
AST (GOT): 44 U/L — ABNORMAL HIGH (ref 15–40)
Albumin: 4.5 g/dL (ref 3.5–5.2)
Alkaline Phosphatase (Total): 62 U/L (ref 39–139)
Anion Gap: 11 (ref 3–11)
Bilirubin (Total): 1.3 mg/dL (ref 0.2–1.3)
Calcium: 10.2 mg/dL (ref 8.9–10.2)
Carbon Dioxide, Total: 28 mEq/L (ref 22–32)
Chloride: 102 mEq/L (ref 98–108)
Creatinine: 0.9 mg/dL (ref 0.2–1.1)
GFR, Calc, African American: >60 mL/min (ref >59)
GFR, Calc, European American: >60 mL/min (ref >59)
Glucose: 133 mg/dL — ABNORMAL HIGH (ref 62–125)
Potassium: 3.8 mEq/L (ref 3.7–5.2)
Protein (Total): 7.9 g/dL (ref 6.0–8.2)
Sodium: 141 mEq/L (ref 136–145)
Urea Nitrogen: 6 mg/dL — ABNORMAL LOW (ref 8–21)

## 2009-08-07 LAB — LIPID PANEL
Cholesterol (LDL): 94 mg/dL (ref <130)
Cholesterol/HDL Ratio: 3.2
HDL Cholesterol: 73 mg/dL (ref >40)
Non-HDL Cholesterol: 159 mg/dL (ref 0–159)
Total Cholesterol: 232 mg/dL — ABNORMAL HIGH (ref <200)
Triglyceride: 323 mg/dL — ABNORMAL HIGH (ref <150)

## 2009-08-19 ENCOUNTER — Encounter (INDEPENDENT_AMBULATORY_CARE_PROVIDER_SITE_OTHER): Payer: Self-pay | Admitting: Internal Medicine

## 2009-08-19 ENCOUNTER — Ambulatory Visit (INDEPENDENT_AMBULATORY_CARE_PROVIDER_SITE_OTHER): Payer: No Typology Code available for payment source | Admitting: Internal Medicine

## 2009-08-19 VITALS — BP 142/96 | HR 102 | Resp 16 | Wt 194.4 lb

## 2009-08-19 NOTE — Progress Notes (Signed)
Patient comes in today for follow up. Stomach is better, no blood in stool. No problem with medication.  BP up, patient reported not watching diet.    Review of Systems -   CV, general, GI as above; except for above the rest of the review of system is negative/normal.    Patient Active Problem List   Diagnoses Code   . SPRAIN LUMBAR REGION;  work injury [id number] 847.2   . HYPERTENSION NOS 401.9   . HYPERCALCEMIA 275.42   . ELEV TRANSAMINASE/LDH 790.4   . FAMILY HX GI MALIGNANCY V16.0   . PATELLAR TENDINITIS;Right 726.64            Current outpatient prescriptions: AmLODIPine Besylate 10 MG OR TABS, Take 1 tablet by mouth daily, Disp: 30, Rfl: 1;  Omeprazole 20 MG OR CPDR, take 1 po q day, Disp: 30, Rfl: 1;  VIAGRA 100 MG OR TABS, Take 1/2-1 tablet 1 hour before sexual activity, not to exceed 1 tablet per day, Disp: 10, Rfl: 5    History   Substance Use Topics   . Tobacco Use: Never   . Alcohol Use: 6.0 oz/week      6 beers/week        Physical Examination:  BP 142/96  Pulse 102  Resp 16  Wt 194 lb 6.4 oz (88.179 kg)  SpO2 98%  BP 140/88  PR 87reg  General: Awake, alert, not in apparent distress.  HEENT: Conjuctivae clear, PERRL  Lungs: Clear to auscultation  Heart: RRR, S1/2 normal, no S3/4  Abdomen: NABS, soft, non-distended, non-tender  Extremities: No edema, cyanosis or clubbing    ASSESSMENT and PLAN:  272.4 HYPERLIPIDEMIA NEC/NOS  (primary encounter diagnosis)  401.9 HYPERTENSION NOS  Comment: Discussed with patient in length, questions answered, patient verbalized understanding.   Plan: LIPID PANEL, HEPATIC FUNCTION PANEL        TLC (therapeutic lifestyle changes) discussed.    Diet discussed.  Importance of BP control and lipid control discussed, patient decline more meds at present but will watch diet very closely.  f/u in 6 weeks, sooner if needed.  Call if signs and symptoms discussed occurs or if with problem or questions.    530.81 ESOPHAGEAL REFLUX  Comment: improved, continue omeprazole and diet as  discussed.    I spent 25 minutes with patient, more than 50% of the service spent in counseling and coordinating care.

## 2009-08-19 NOTE — Progress Notes (Signed)
Pt here for follow up.

## 2009-09-30 ENCOUNTER — Other Ambulatory Visit (INDEPENDENT_AMBULATORY_CARE_PROVIDER_SITE_OTHER): Payer: Self-pay | Admitting: Internal Medicine

## 2009-09-30 MED ORDER — OMEPRAZOLE 20 MG OR CPDR
DELAYED_RELEASE_CAPSULE | ORAL | Status: DC
Start: 2009-09-30 — End: 2009-12-01

## 2009-09-30 NOTE — Telephone Encounter (Signed)
Rx refilled and faxed. Thank you.

## 2009-09-30 NOTE — Telephone Encounter (Signed)
Medication Refill Documentation    Name of Medication: Omeprazole 20mg     Prescribing provider:  Anastasia Fiedler, MD    Protocol used (by medication type, not necessarily patient diagnosis): Adult:  GI (including GERD, constipation, IBS meds) - OK to refill if seen in last 12 months.    Date last filled: 9-20-210    Date last seen for this issue:  08/19/2009      Last 2 Encounter BP Readings:   Date BP   08/19/2009 142/96   08/04/2009 130/90           Next scheduled appointment date:  Visit date not found

## 2009-10-30 ENCOUNTER — Ambulatory Visit (INDEPENDENT_AMBULATORY_CARE_PROVIDER_SITE_OTHER): Payer: No Typology Code available for payment source | Admitting: Physician Assistant

## 2009-10-30 ENCOUNTER — Telehealth (INDEPENDENT_AMBULATORY_CARE_PROVIDER_SITE_OTHER): Payer: Self-pay | Admitting: Internal Medicine

## 2009-10-30 VITALS — BP 150/80 | HR 105 | Temp 98.4°F | Resp 18 | Wt 198.8 lb

## 2009-10-30 MED ORDER — AMLODIPINE BESYLATE 10 MG OR TABS
ORAL_TABLET | ORAL | Status: DC
Start: 2009-10-30 — End: 2010-01-03

## 2009-10-30 NOTE — Progress Notes (Signed)
Pt is here for the tip of a qtip in his right ear.

## 2009-10-30 NOTE — Telephone Encounter (Signed)
Triage Nurse Telephone Encounter    Chief Complaint: Ear, Foreign body    Reviewed Problem List, Medications and Allergies: YES    Description of symptoms: Patient states that he was taking a hearing test at work and the technician noticed the cotton tip of a swab in his R ear canal and he is unable to remove it. He denies hearing loss,drainage, pain, or dizziness.    ROS: negative per protocol except as noted in history    Protocol used for assessment: Ear ,Foreign body    Recommended disposition: Same day appointment within 2-4 hours    Caller agrees:  YES    PLAN: Appointment today at 11:40.    Home care instructions provided:  YES     Instructed to call back or seek tx if sxs worsen or has new concerns: YES     Caller understands:  YES    Follow up call needed  NO    Reference used: [employer] 3rd edition, Telephone Triage Protocols for Nurses

## 2009-11-02 NOTE — Progress Notes (Signed)
SUBJECTIVE:  Roger [name] is a 48 year old male who presents to the clinic today with c/o right ear with cotton in it.  States that he had hearing test and was told that there is cotton in the ear.  Reports that he does use q-tips.  Not sure when this happened.     While here would also like refill of his BP meds.  Denies headache, dizziness, palpitations, chest pain, dyspnea and pedal edema.     Patient Active Problem List   Diagnoses Code   . SPRAIN LUMBAR REGION;  work injury [id number] 847.2   . HYPERTENSION NOS 401.9   . HYPERCALCEMIA 275.42   . ELEV TRANSAMINASE/LDH 790.4   . FAMILY HX GI MALIGNANCY V16.0   . PATELLAR TENDINITIS;Right 726.64          Current outpatient prescriptions   Medication Sig   . AmLODIPine Besylate 10 MG OR TABS Take 1 tablet by mouth daily   . Omeprazole 20 MG OR CPDR take 1 po q day   . VIAGRA 100 MG OR TABS Take 1/2-1 tablet 1 hour before sexual activity, not to exceed 1 tablet per day       History   Substance Use Topics   . Tobacco Use: Never   . Alcohol Use: 6.0 oz/week      6 beers/week      OBJECTIVE:  General: alert, pleasant, cooperative, no acute distress  NOted elevated systolic today  HEENT: Eyes: conjunctiva/sclera clear,  no discharge.  Ears: canals clear on left, right with cotton present, flushed out with lavage showing piece of cotton 7mm x 5 mm, TM's normal bilaterally following lavage on righ.   Neck: supple, no adenopathy  Lungs: CTA, no wheezes or crackles noted  CV: RRR, no m/g/r    ASSESSMENT/PLAN:  931 FOREIGN BODY IN EAR  (primary encounter diagnosis)  Comment:    Plan: EAR WAX LAVAGE ONLY        Removed with lavage.  Discussed use of cotton swabs in ears.     401.9 HYPERTENSION NOS  Comment:    Plan: AMLODIPINE BESYLATE 10 MG OR TABS        Refilled, discussed goals of treatment and reading today.  Reviewed past readings with patient as well.

## 2009-12-01 ENCOUNTER — Other Ambulatory Visit (INDEPENDENT_AMBULATORY_CARE_PROVIDER_SITE_OTHER): Payer: Self-pay | Admitting: Internal Medicine

## 2009-12-01 ENCOUNTER — Ambulatory Visit (INDEPENDENT_AMBULATORY_CARE_PROVIDER_SITE_OTHER): Payer: No Typology Code available for payment source | Admitting: Internal Medicine

## 2009-12-01 MED ORDER — OMEPRAZOLE 20 MG OR CPDR
DELAYED_RELEASE_CAPSULE | ORAL | Status: DC
Start: 2009-12-01 — End: 2010-02-02

## 2009-12-01 NOTE — Telephone Encounter (Signed)
Medication Refill Documentation    Name of Medication: omeprazole 20mg     Prescribing provider:  Anastasia Fiedler, MD    Protocol used (by medication type, not necessarily patient diagnosis): Adult:  GI (including GERD, constipation, IBS meds) - OK to refill if seen in last 12 months.    Date last filled: 11-04-2009    Date last seen for this issue:  08/19/2009      Last 2 Encounter BP Readings:   Date BP   10/30/2009 150/80   08/19/2009 142/96           Next scheduled appointment date:  Visit date not found      Authorized/faxed per protocols.

## 2009-12-11 ENCOUNTER — Ambulatory Visit (INDEPENDENT_AMBULATORY_CARE_PROVIDER_SITE_OTHER): Payer: No Typology Code available for payment source | Admitting: Internal Medicine

## 2009-12-16 ENCOUNTER — Encounter (INDEPENDENT_AMBULATORY_CARE_PROVIDER_SITE_OTHER): Payer: Self-pay | Admitting: Internal Medicine

## 2009-12-16 ENCOUNTER — Ambulatory Visit (INDEPENDENT_AMBULATORY_CARE_PROVIDER_SITE_OTHER): Payer: No Typology Code available for payment source | Admitting: Internal Medicine

## 2010-01-03 ENCOUNTER — Telehealth (INDEPENDENT_AMBULATORY_CARE_PROVIDER_SITE_OTHER): Payer: Self-pay | Admitting: Physician Assistant

## 2010-01-03 MED ORDER — AMLODIPINE BESYLATE 10 MG OR TABS
ORAL_TABLET | ORAL | Status: DC
Start: 2010-01-03 — End: 2010-04-07

## 2010-01-03 NOTE — Telephone Encounter (Signed)
Received fax for refill of amlodipine.  Filled for one month.  Please have him schedule follow up with Dr. Park Breed

## 2010-01-05 NOTE — Telephone Encounter (Signed)
Called to pt and left him a message to call the clinic and schedule an appointment with doctor DY  For future refills.

## 2010-01-12 ENCOUNTER — Ambulatory Visit (INDEPENDENT_AMBULATORY_CARE_PROVIDER_SITE_OTHER): Payer: No Typology Code available for payment source | Admitting: Internal Medicine

## 2010-01-17 NOTE — Telephone Encounter (Signed)
appt was made for 1/31 and pt did cancel.  Will need another appt for further refills

## 2010-01-17 NOTE — Telephone Encounter (Signed)
Noted. Thanks Kim.    Ed

## 2010-01-21 NOTE — Telephone Encounter (Signed)
Has anyone called patient to reschedule?

## 2010-01-21 NOTE — Telephone Encounter (Signed)
Left message with son advising needing to schedule with Dr Park Breed before needing refill on his Amlodipine.

## 2010-01-26 ENCOUNTER — Encounter (INDEPENDENT_AMBULATORY_CARE_PROVIDER_SITE_OTHER): Payer: Self-pay | Admitting: Internal Medicine

## 2010-01-26 ENCOUNTER — Ambulatory Visit (INDEPENDENT_AMBULATORY_CARE_PROVIDER_SITE_OTHER): Payer: No Typology Code available for payment source | Admitting: Internal Medicine

## 2010-01-26 VITALS — BP 130/90 | HR 76 | Temp 98.2°F | Resp 18 | Wt 203.0 lb

## 2010-01-26 NOTE — Progress Notes (Signed)
Patient comes in today for L testicular discomfort, symptoms started 3-4 months ago. Intermittent. Occurs about 2 x a month, last a minute and resolve spontaneously. Last episode was 1 week ago. No triggering or relieving factors. Sexually active with single male partner, both monogamous. No penile discharge no lesion.    Still with intermittent hemorrhoids flare. Patient states last colonoscopy was about 2003 and was normal. Hemorrhoid cream helps. I reviewed [facility]: "IMPRESSION:  Hemorrhoids. Otherwise normal colonoscopy.    PLAN:  Because of the suboptimal prep in the transverse colon, we will repeat colonoscopy in 3  years, as patient has family history of colorectal cancer and small lesions can be missed."    Review of Systems -   General, GU, GI as above; except for above the rest of the review of system is negative/normal.    Patient Active Problem List   Diagnoses Code   . SPRAIN LUMBAR REGION;  work injury [id number] 847.2   . HYPERTENSION NOS 401.9   . HYPERCALCEMIA 275.42   . ELEV TRANSAMINASE/LDH 790.4   . FAMILY HX GI MALIGNANCY V16.0   . PATELLAR TENDINITIS;Right 726.64          Current outpatient prescriptions: AmLODIPine Besylate 10 MG OR TABS, Take 1 tablet by mouth daily, Disp: 30 Tab, Rfl: 0;  Omeprazole 20 MG OR CPDR, take 1 po q day, Disp: 30, Rfl: 1;  VIAGRA 100 MG OR TABS, Take 1/2-1 tablet 1 hour before sexual activity, not to exceed 1 tablet per day, Disp: 10, Rfl: 5    History   Substance Use Topics   . Tobacco Use: Never   . Alcohol Use: 6.0 oz/week      6 beers/week        Physical Examination:  BP 130/90  Pulse 76  Temp(Src) 98.2 F (36.8 C) (Oral)  Resp 18  Wt 203 lb (92.08 kg)  General: Awake, alert, not in apparent distress.  HEENT: Conjuctivae clear, PERRL  Lungs: Clear to auscultation  Heart: RRR, S1/2 normal, no S3/4  Abdomen: NABS, soft, non-distended, non-tender  Extremities: No edema, cyanosis or clubbing  No testicular mass or tenderness, no penile discharge, no penile  lesion.  Rectal exam : no hemorrhoids or mass noted.    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   Plan: SONO SCROTUM, PROSTATE SPECIFIC ANTIGEN        Call if signs and symptoms discussed occurs or if with problem or questions.     V16.0 FAMILY HX GI MALIGNANCY  Comment: Discussed with patient in length, questions answered, patient verbalized understanding.   Plan: REFERRAL TO SCREENING COLONOSCOPY        Due for follow up.    401.9 HYPERTENSION NOS  Comment: Discussed with patient in length, questions answered, patient verbalized understanding.   Plan: LIPID PANEL, BASIC METABOLIC PANEL        TLC (therapeutic lifestyle changes) discussed.     I spent 30 minutes with patient, more than 50% of the service spent in counseling and coordinating care.  Referral and order given to patient, patient to call to schedule.    f/u in 2 weeks, sooner if needed.  Call if signs and symptoms discussed occurs or if with problem or questions.

## 2010-01-26 NOTE — Progress Notes (Signed)
Pt here concerns of pain around prostate area, also check for hemorrhoid.

## 2010-01-31 ENCOUNTER — Other Ambulatory Visit (INDEPENDENT_AMBULATORY_CARE_PROVIDER_SITE_OTHER): Payer: No Typology Code available for payment source

## 2010-02-02 ENCOUNTER — Other Ambulatory Visit (INDEPENDENT_AMBULATORY_CARE_PROVIDER_SITE_OTHER): Payer: Self-pay | Admitting: Internal Medicine

## 2010-02-02 ENCOUNTER — Other Ambulatory Visit (INDEPENDENT_AMBULATORY_CARE_PROVIDER_SITE_OTHER): Payer: No Typology Code available for payment source

## 2010-02-02 MED ORDER — OMEPRAZOLE 20 MG OR CPDR
DELAYED_RELEASE_CAPSULE | ORAL | Status: DC
Start: 2010-02-02 — End: 2010-04-07

## 2010-02-02 NOTE — Telephone Encounter (Signed)
Medication Refill Documentation    Name of Medication: omeprazole 20mg     Prescribing provider:  Anastasia Fiedler, MD    Protocol used (by medication type, not necessarily patient diagnosis): Adult:  GI (including GERD, constipation, IBS meds) - OK to refill if seen in last 12 months.    Date last filled: 01-02-2010    Date last seen for this issue:  08/19/2009      Last 2 Encounter BP Readings:   Date BP   01/26/2010 130/90   10/30/2009 150/80           Next scheduled appointment date:  02/09/2010      Authorized/faxed per protocol.

## 2010-02-03 ENCOUNTER — Other Ambulatory Visit (HOSPITAL_BASED_OUTPATIENT_CLINIC_OR_DEPARTMENT_OTHER): Payer: No Typology Code available for payment source

## 2010-02-03 LAB — PR SONO SCROTUM

## 2010-02-09 ENCOUNTER — Ambulatory Visit (INDEPENDENT_AMBULATORY_CARE_PROVIDER_SITE_OTHER): Payer: No Typology Code available for payment source | Admitting: Internal Medicine

## 2010-02-09 ENCOUNTER — Encounter (INDEPENDENT_AMBULATORY_CARE_PROVIDER_SITE_OTHER): Payer: Self-pay | Admitting: Internal Medicine

## 2010-02-13 ENCOUNTER — Other Ambulatory Visit (INDEPENDENT_AMBULATORY_CARE_PROVIDER_SITE_OTHER): Payer: No Typology Code available for payment source | Admitting: Internal Medicine

## 2010-02-13 ENCOUNTER — Other Ambulatory Visit (INDEPENDENT_AMBULATORY_CARE_PROVIDER_SITE_OTHER): Payer: No Typology Code available for payment source

## 2010-02-13 LAB — LIPID PANEL
Cholesterol/HDL Ratio: 3.1
HDL Cholesterol: 75 mg/dL (ref >40)
Non-HDL Cholesterol: 157 mg/dL (ref 0–159)
Total Cholesterol: 232 mg/dL — ABNORMAL HIGH (ref <200)
Triglyceride: 697 mg/dL — ABNORMAL HIGH (ref <150)

## 2010-02-13 LAB — BASIC METABOLIC PANEL
Anion Gap: 12 — ABNORMAL HIGH (ref 3–11)
Calcium: 10.5 mg/dL — ABNORMAL HIGH (ref 8.9–10.2)
Carbon Dioxide, Total: 27 mEq/L (ref 22–32)
Chloride: 97 mEq/L — ABNORMAL LOW (ref 98–108)
Creatinine: 0.9 mg/dL (ref 0.2–1.1)
GFR, Calc, African American: >60 mL/min (ref >59)
GFR, Calc, European American: >60 mL/min (ref >59)
Glucose: 140 mg/dL — ABNORMAL HIGH (ref 62–125)
Potassium: 4.7 mEq/L (ref 3.7–5.2)
Sodium: 136 mEq/L (ref 136–145)
Urea Nitrogen: 8 mg/dL (ref 8–21)

## 2010-02-13 LAB — PSA, DIAGNOSTIC/MONITORING: PSA, Diagnostic/Monitoring: 3.85 ng/mL (ref 0.00–4.00)

## 2010-02-16 ENCOUNTER — Encounter (INDEPENDENT_AMBULATORY_CARE_PROVIDER_SITE_OTHER): Payer: Self-pay | Admitting: Internal Medicine

## 2010-02-16 ENCOUNTER — Ambulatory Visit (INDEPENDENT_AMBULATORY_CARE_PROVIDER_SITE_OTHER): Payer: No Typology Code available for payment source | Admitting: Internal Medicine

## 2010-02-16 VITALS — BP 150/100 | HR 74 | Resp 18 | Wt 199.0 lb

## 2010-02-16 NOTE — Progress Notes (Signed)
Patient comes in today for follow up.  Lab discussed.  [place] discussed.  Patient reports he has been to more party recently and has been drinking beers during parties.  CAGE (-)  No further testicular or groin pain.    Review of Systems -   CV (-)  Respiratory (-)  General, GU as above; except for above the rest of the review of system is negative/normal.    Patient Active Problem List   Diagnoses Code   . SPRAIN LUMBAR REGION;  work injury [id number] 847.2   . HYPERTENSION NOS 401.9   . HYPERCALCEMIA 275.42   . ELEV TRANSAMINASE/LDH 790.4   . FAMILY HX GI MALIGNANCY V16.0   . PATELLAR TENDINITIS;Right 726.64            Current outpatient prescriptions: AmLODIPine Besylate 10 MG OR TABS, Take 1 tablet by mouth daily, Disp: 30 Tab, Rfl: 0;  Omeprazole 20 MG OR CPDR, take 1 po q day, Disp: 30 Cap, Rfl: 1;  VIAGRA 100 MG OR TABS, Take 1/2-1 tablet 1 hour before sexual activity, not to exceed 1 tablet per day, Disp: 10, Rfl: 5    History   Substance Use Topics   . Tobacco Use: Never   . Alcohol Use: 6.0 oz/week      6 beers/week        Physical Examination:  BP 150/100  Pulse 74  Resp 18  Wt 199 lb (90.266 kg)  BP recheck 148/92  General: Awake, alert, not in apparent distress.  HEENT: Conjuctivae clear, PERRL  Lungs: Clear to auscultation  Heart: RRR, S1/2 normal, no S3/4  Abdomen: NABS, soft, non-distended, non-tender  Extremities: No edema, cyanosis or clubbing    ASSESSMENT and PLAN:  401.9 HYPERTENSION NOS  (primary encounter diagnosis)  272.4 HYPERLIPIDEMIA NEC/NOS  Comment: Discussed with patient in length, questions answered, patient verbalized understanding.   Plan: LIPID PANEL, COMPREHENSIVE METABOLIC PANEL,         A1C, ONSITE, RAPID        Discussed medication dosage, usage, goals of therapy, and side effects.  Diet and TLC (therapeutic lifestyle changes) discussed.   D/c alcohol and pop.  Patient decline to take more meds at present but will start watching diet and exercise regularly.  f/u in 1 month with lab  prior , sooner if needed.  Call if signs and symptoms discussed occurs or if with problem or questions.  [place] discussed, urologist consult discussed, patient refused. Importance of early detection and tx discussed.  I spent 25 minutes with patient, more than 50% of the service spent in counseling and coordinating care.

## 2010-02-16 NOTE — Progress Notes (Signed)
pt here for lab results.

## 2010-03-10 ENCOUNTER — Other Ambulatory Visit (HOSPITAL_BASED_OUTPATIENT_CLINIC_OR_DEPARTMENT_OTHER): Payer: Self-pay | Admitting: Gastroenterology

## 2010-03-10 ENCOUNTER — Ambulatory Visit (HOSPITAL_BASED_OUTPATIENT_CLINIC_OR_DEPARTMENT_OTHER): Payer: No Typology Code available for payment source | Attending: Gastroenterology | Admitting: Gastroenterology

## 2010-03-10 DIAGNOSIS — D126 Benign neoplasm of colon, unspecified: Secondary | ICD-10-CM | POA: Insufficient documentation

## 2010-03-12 LAB — PATHOLOGY, SURGICAL

## 2010-04-07 ENCOUNTER — Other Ambulatory Visit (INDEPENDENT_AMBULATORY_CARE_PROVIDER_SITE_OTHER): Payer: Self-pay | Admitting: Internal Medicine

## 2010-04-07 NOTE — Telephone Encounter (Signed)
Medication Refill Documentation    Name of Medication: omeprazole 20mg , amlodipine besylate 10mg     Prescribing provider:  Anastasia Fiedler, MD    Protocol used (by medication type, not necessarily patient diagnosis): Adult:  GI (including GERD, constipation, IBS meds) - OK to refill if seen in last 12 months.    Date last filled: 03-05-2010    Date last seen for this issue:  02/16/2010      Last 2 Encounter BP Readings:   Date BP   02/16/2010 150/100   01/26/2010 130/90           Next scheduled appointment date:  Visit date not found

## 2010-04-09 MED ORDER — AMLODIPINE BESYLATE 10 MG OR TABS
ORAL_TABLET | ORAL | Status: DC
Start: 2010-04-07 — End: 2013-08-03

## 2010-04-09 MED ORDER — OMEPRAZOLE 20 MG OR CPDR
DELAYED_RELEASE_CAPSULE | ORAL | Status: DC
Start: 2010-04-07 — End: 2012-12-08

## 2010-04-09 NOTE — Telephone Encounter (Signed)
Rx refilled and faxed. Thank you.

## 2010-04-20 ENCOUNTER — Other Ambulatory Visit (INDEPENDENT_AMBULATORY_CARE_PROVIDER_SITE_OTHER): Payer: No Typology Code available for payment source

## 2010-04-20 LAB — COMPREHENSIVE METABOLIC PANEL
ALT (GPT): 84 U/L — ABNORMAL HIGH (ref 10–64)
AST (GOT): 71 U/L — ABNORMAL HIGH (ref 15–40)
Albumin: 4.8 g/dL (ref 3.5–5.2)
Alkaline Phosphatase (Total): 58 U/L (ref 39–139)
Anion Gap: 11 (ref 3–11)
Bilirubin (Total): 1 mg/dL (ref 0.2–1.3)
Calcium: 10.6 mg/dL — ABNORMAL HIGH (ref 8.9–10.2)
Carbon Dioxide, Total: 30 mEq/L (ref 22–32)
Chloride: 97 mEq/L — ABNORMAL LOW (ref 98–108)
Creatinine: 0.84 mg/dL (ref 0.20–1.10)
GFR, Calc, African American: >60 mL/min (ref >59)
GFR, Calc, European American: >60 mL/min (ref >59)
Glucose: 148 mg/dL — ABNORMAL HIGH (ref 62–125)
Potassium: 4.3 mEq/L (ref 3.7–5.2)
Protein (Total): 7.7 g/dL (ref 6.0–8.2)
Sodium: 138 mEq/L (ref 136–145)
Urea Nitrogen: 5 mg/dL — ABNORMAL LOW (ref 8–21)

## 2010-04-20 LAB — PR A1C RAPID, ONSITE: Hemoglobin A1C: 6.2 % — ABNORMAL HIGH (ref 4.0–6.0)

## 2010-04-20 LAB — PR LIPID PANEL, ONSITE
Cholesterol (LDL): 82 mg/dL (ref ?–130)
Cholesterol/HDL Ratio: 2.1
HDL Cholesterol: 100 mg/dL (ref 40–?)
Non-HDL Cholesterol: 113 mg/dL (ref 0–159)
Total Cholesterol: 213 mg/dL — ABNORMAL HIGH (ref ?–200)
Triglyceride: 156 mg/dL — ABNORMAL HIGH (ref ?–150)

## 2010-04-27 ENCOUNTER — Ambulatory Visit (INDEPENDENT_AMBULATORY_CARE_PROVIDER_SITE_OTHER): Payer: No Typology Code available for payment source | Admitting: Internal Medicine

## 2010-04-27 VITALS — BP 130/80 | HR 74 | Resp 18 | Wt 201.0 lb

## 2010-04-27 NOTE — Progress Notes (Signed)
Pt here for f/u on lab results.

## 2010-04-27 NOTE — Progress Notes (Signed)
Patient comes in today for follow up.  Lab discussed.  Patient quit alcohol and pop since last visit.  Trig down significantly.  Calcium elevated.  LFT elevated, patient quit alcohol but was taking ultram for R foot injury that [facility] are following  No jaundice    Review of Systems -   CV (-)  General, ortho, GI as above; except for above the rest of the review of system is negative/normal.      Patient Active Problem List   Diagnoses Code   . SPRAIN LUMBAR REGION;  work injury [id number] 847.2   . HYPERTENSION NOS 401.9   . HYPERCALCEMIA 275.42   . ELEV TRANSAMINASE/LDH 790.4   . FAMILY HX GI MALIGNANCY V16.0   . PATELLAR TENDINITIS;Right 726.64            Current outpatient prescriptions: AmLODIPine Besylate 10 MG OR TABS, Take 1 tablet by mouth daily, Disp: 30 Tab, Rfl: 3;  Omeprazole 20 MG OR CPDR, take 1 po q day, Disp: 30 Cap, Rfl: 3;  VIAGRA 100 MG OR TABS, Take 1/2-1 tablet 1 hour before sexual activity, not to exceed 1 tablet per day, Disp: 10, Rfl: 5    History   Substance Use Topics   . Tobacco Use: Never   . Alcohol Use: 6.0 oz/week      6 beers/week        Physical Examination:  BP 130/80  Pulse 74  Resp 18  Wt 201 lb (91.173 kg)  General: Awake, alert, not in apparent distress.  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   Plan: MICROALBUMIN, QUANT, URINE, ONSITE, ASSAY URINE        CREATININE, FOOT EXAM PERFORMED        Diet and TLC (therapeutic lifestyle changes) discussed.       Component   Latest Reference Range  08/07/2009 02/13/2010 04/20/2010      8:43 AM  8:01 AM 11:01 AM   GLUCOSE 62 - 125 mg/dL 425 (H) 956 (H) 387 (H)   a1c is    Component   Latest Reference Range  04/20/2010     11:01 AM   HEMOGLOBIN A1C, RAPID 4.0 - 6.0 % 6.2 (H)   Patient will schedule eye exam by self    272.4 HYPERLIPIDEMIA NEC/NOS  Comment: Discussed with patient in length, questions answered, patient verbalized understanding.   Plan: CALCIUM, (REFLEXIVE IONIZED), HEPATIC FUNCTION         PANEL        Discussed.  Lab in 2 weeks.    729.5 PAIN IN LIMB  Comment: Discussed with patient in length, questions answered, patient verbalized understanding.   Plan: TRAMADOL HCL 50 MG OR TABS        Discussed medication dosage, usage, goals of therapy, and side effects.    f/u in 2 months with lab prior, sooner if needed.  Call if signs and symptoms discussed occurs or if with problem or questions.  I spent 25 minutes with patient, more than 50% of the service spent in counseling and coordinating care.

## 2010-07-17 ENCOUNTER — Emergency Department: Payer: Self-pay | Admitting: Emergency Medicine

## 2010-10-16 ENCOUNTER — Encounter: Payer: Self-pay | Admitting: Internal Medicine

## 2010-10-16 NOTE — Progress Notes (Signed)
Date request received:10-16-10  Reason for ROI Patient transferring care  Release to Provider Office:  Company/Third Party name: [employer]  Invoiced YES   IOD Staff Member:1970

## 2011-04-06 ENCOUNTER — Ambulatory Visit (INDEPENDENT_AMBULATORY_CARE_PROVIDER_SITE_OTHER): Payer: No Typology Code available for payment source | Admitting: Internal Medicine

## 2011-07-21 ENCOUNTER — Emergency Department: Payer: Self-pay | Admitting: Emergency Medicine

## 2011-07-23 ENCOUNTER — Emergency Department: Payer: Self-pay | Admitting: Emergency Medicine

## 2011-08-04 ENCOUNTER — Encounter: Payer: Self-pay | Admitting: Nurse Practitioner

## 2011-08-04 ENCOUNTER — Encounter: Payer: Self-pay | Admitting: Cardiothoracic Surgery

## 2011-08-14 ENCOUNTER — Encounter: Payer: Self-pay | Admitting: Cardiothoracic Surgery

## 2011-08-14 ENCOUNTER — Encounter: Payer: Self-pay | Admitting: Nurse Practitioner

## 2011-09-13 ENCOUNTER — Encounter: Payer: Self-pay | Admitting: Nurse Practitioner

## 2011-09-13 ENCOUNTER — Encounter: Payer: Self-pay | Admitting: Cardiothoracic Surgery

## 2011-10-04 ENCOUNTER — Ambulatory Visit: Payer: Self-pay | Admitting: Internal Medicine

## 2011-10-14 ENCOUNTER — Encounter: Payer: Self-pay | Admitting: Cardiothoracic Surgery

## 2011-10-14 ENCOUNTER — Encounter: Payer: Self-pay | Admitting: Nurse Practitioner

## 2011-11-13 ENCOUNTER — Encounter: Payer: Self-pay | Admitting: Nurse Practitioner

## 2011-11-13 ENCOUNTER — Encounter: Payer: Self-pay | Admitting: Cardiothoracic Surgery

## 2011-12-14 ENCOUNTER — Encounter: Payer: Self-pay | Admitting: Cardiothoracic Surgery

## 2011-12-14 ENCOUNTER — Encounter: Payer: Self-pay | Admitting: Nurse Practitioner

## 2011-12-16 ENCOUNTER — Telehealth (INDEPENDENT_AMBULATORY_CARE_PROVIDER_SITE_OTHER): Payer: Self-pay | Admitting: Internal Medicine

## 2011-12-16 NOTE — Telephone Encounter (Signed)
Noted. OK to wait if not tender now.

## 2011-12-16 NOTE — Telephone Encounter (Signed)
Triage Nurse Telephone Encounter  Chief Complaint: on and off ache left groin "just to left of left testicle"  X 1 week.  3 days ago pt was able to palpate small lump - not able to palpitate lump now.  Pt already made appt to be seen tomorrow.    Reviewed Problem List, Medications and Allergies: YES    Description of symptoms: as above.  Mo additional.  ROS: negative per protocol except as noted in history  Protocol used for assessment: genital problems, male  Recommended disposition: See provider within 24 hours  Caller agrees:  YES    PLAN: Pt will call if pain increases or other symptoms develop.  Has appt tomorrow 11:30 am.  Home care instructions provided:  YES   Instructed to call back or seek tx if sxs worsen or has new concerns: YES   Caller understands:  YES  Follow up call needed  NO  Reference used: [employer] 3rd edition, Telephone Triage Protocols for Nurses

## 2011-12-16 NOTE — Telephone Encounter (Signed)
CONFIRMED PHONE NUMBER: [phone]   CALLERS FIRST AND LAST NAME: Clabe Seal  FACILITY NAME: N/A TITLE: N/A  CALLERS RELATIONSHIP: Self  RETURN CALL: General message OK    SUBJECT: General Message   REASON FOR REQUEST: Triage Request    MESSAGE: Pt stated he has an "ache" in his testicles and is requesting a call back to discuss further.

## 2011-12-20 ENCOUNTER — Ambulatory Visit (INDEPENDENT_AMBULATORY_CARE_PROVIDER_SITE_OTHER): Payer: Commercial Managed Care - PPO | Admitting: Family Medicine

## 2011-12-20 ENCOUNTER — Ambulatory Visit (INDEPENDENT_AMBULATORY_CARE_PROVIDER_SITE_OTHER): Payer: Commercial Managed Care - PPO | Admitting: Internal Medicine

## 2011-12-29 LAB — WOUND CULTURE

## 2012-01-14 ENCOUNTER — Encounter: Payer: Self-pay | Admitting: Nurse Practitioner

## 2012-01-14 ENCOUNTER — Encounter: Payer: Self-pay | Admitting: Cardiothoracic Surgery

## 2012-02-11 ENCOUNTER — Encounter: Payer: Self-pay | Admitting: Nurse Practitioner

## 2012-02-11 ENCOUNTER — Encounter: Payer: Self-pay | Admitting: Cardiothoracic Surgery

## 2012-03-26 ENCOUNTER — Inpatient Hospital Stay: Payer: Self-pay | Admitting: Internal Medicine

## 2012-03-26 LAB — COMPREHENSIVE METABOLIC PANEL
Bilirubin,Total: 0.8 mg/dL (ref 0.2–1.0)
Chloride: 99 mmol/L (ref 98–107)
Creatinine: 1.61 mg/dL — ABNORMAL HIGH (ref 0.60–1.30)
EGFR (Non-African Amer.): 49 — ABNORMAL LOW
Glucose: 418 mg/dL — ABNORMAL HIGH (ref 65–99)
Osmolality: 299 (ref 275–301)
Potassium: 4.7 mmol/L (ref 3.5–5.1)
SGPT (ALT): 29 U/L
Total Protein: 6.7 g/dL (ref 6.4–8.2)

## 2012-03-26 LAB — CBC
HCT: 38.8 % — ABNORMAL LOW (ref 40.0–52.0)
HGB: 12.7 g/dL — ABNORMAL LOW (ref 13.0–18.0)
MCV: 81 fL (ref 80–100)
Platelet: 445 10*3/uL — ABNORMAL HIGH (ref 150–440)
WBC: 18 10*3/uL — ABNORMAL HIGH (ref 3.8–10.6)

## 2012-03-26 LAB — URINALYSIS, COMPLETE
Bilirubin,UR: NEGATIVE
Glucose,UR: >=500 mg/dL (ref 0–75)
Granular Cast: 3
Nitrite: NEGATIVE
Specific Gravity: 1.024 (ref 1.003–1.030)
WBC UR: 4 /HPF (ref 0–5)

## 2012-03-26 LAB — CK TOTAL AND CKMB (NOT AT ARMC)
CK, Total: 377 U/L — ABNORMAL HIGH (ref 35–232)
CK-MB: 9.9 ng/mL — ABNORMAL HIGH (ref 0.5–3.6)

## 2012-03-27 LAB — BASIC METABOLIC PANEL
Anion Gap: 13 (ref 7–16)
BUN: 37 mg/dL — ABNORMAL HIGH (ref 7–18)
Chloride: 101 mmol/L (ref 98–107)
Creatinine: 1.6 mg/dL — ABNORMAL HIGH (ref 0.60–1.30)
EGFR (Non-African Amer.): 49 — ABNORMAL LOW
Glucose: 85 mg/dL (ref 65–99)
Osmolality: 285 (ref 275–301)
Sodium: 139 mmol/L (ref 136–145)

## 2012-03-27 LAB — LIPID PANEL
Cholesterol: 186 mg/dL (ref 0–200)
HDL Cholesterol: 65 mg/dL — ABNORMAL HIGH (ref 40–60)
Ldl Cholesterol, Calc: 105 mg/dL — ABNORMAL HIGH (ref 0–100)
VLDL Cholesterol, Calc: 16 mg/dL (ref 5–40)

## 2012-03-27 LAB — CBC WITH DIFFERENTIAL/PLATELET
Basophil #: 0 10*3/uL (ref 0.0–0.1)
Eosinophil %: 0 %
HCT: 36.8 % — ABNORMAL LOW (ref 40.0–52.0)
HGB: 11.5 g/dL — ABNORMAL LOW (ref 13.0–18.0)
Lymphocyte #: 1.4 10*3/uL (ref 1.0–3.6)
Lymphocyte %: 7 %
MCH: 24.9 pg — ABNORMAL LOW (ref 26.0–34.0)
MCV: 80 fL (ref 80–100)
Monocyte #: 1.7 x10 3/mm — ABNORMAL HIGH (ref 0.2–1.0)
Monocyte %: 8.5 %
Neutrophil #: 16.7 10*3/uL — ABNORMAL HIGH (ref 1.4–6.5)
Neutrophil %: 84.4 %
RBC: 4.61 10*6/uL (ref 4.40–5.90)
RDW: 15.8 % — ABNORMAL HIGH (ref 11.5–14.5)

## 2012-03-27 LAB — HEMOGLOBIN A1C: Hemoglobin A1C: 11.5 % — ABNORMAL HIGH (ref 4.2–6.3)

## 2012-03-27 LAB — URINE CULTURE

## 2012-03-27 LAB — CK TOTAL AND CKMB (NOT AT ARMC)
CK, Total: 373 U/L — ABNORMAL HIGH (ref 35–232)
CK-MB: 8.9 ng/mL — ABNORMAL HIGH (ref 0.5–3.6)

## 2012-03-28 LAB — CBC WITH DIFFERENTIAL/PLATELET
Basophil #: 0 10*3/uL (ref 0.0–0.1)
Basophil %: 0.2 %
Eosinophil #: 0 10*3/uL (ref 0.0–0.7)
Eosinophil %: 0.1 %
HCT: 37.9 % — ABNORMAL LOW (ref 40.0–52.0)
HGB: 12 g/dL — ABNORMAL LOW (ref 13.0–18.0)
Lymphocyte #: 1 10*3/uL (ref 1.0–3.6)
Lymphocyte %: 7.5 %
MCH: 25.1 pg — ABNORMAL LOW (ref 26.0–34.0)
MCHC: 31.6 g/dL — ABNORMAL LOW (ref 32.0–36.0)
MCV: 80 fL (ref 80–100)
Platelet: 420 10*3/uL (ref 150–440)
RBC: 4.77 10*6/uL (ref 4.40–5.90)
RDW: 16.3 % — ABNORMAL HIGH (ref 11.5–14.5)

## 2012-03-28 LAB — BASIC METABOLIC PANEL
Anion Gap: 11 (ref 7–16)
BUN: 40 mg/dL — ABNORMAL HIGH (ref 7–18)
Calcium, Total: 8.7 mg/dL (ref 8.5–10.1)
Chloride: 104 mmol/L (ref 98–107)
Creatinine: 1.4 mg/dL — ABNORMAL HIGH (ref 0.60–1.30)
EGFR (African American): >60
Glucose: 132 mg/dL — ABNORMAL HIGH (ref 65–99)
Osmolality: 289 (ref 275–301)
Potassium: 4.1 mmol/L (ref 3.5–5.1)

## 2012-03-28 LAB — TSH: Thyroid Stimulating Horm: 1.4 u[IU]/mL

## 2012-03-28 LAB — PRO B NATRIURETIC PEPTIDE: B-Type Natriuretic Peptide: 33505 pg/mL — ABNORMAL HIGH (ref 0–125)

## 2012-03-29 LAB — CBC WITH DIFFERENTIAL/PLATELET
Basophil %: 0.1 %
Eosinophil #: 0 10*3/uL (ref 0.0–0.7)
HCT: 38.3 % — ABNORMAL LOW (ref 40.0–52.0)
HGB: 12 g/dL — ABNORMAL LOW (ref 13.0–18.0)
Lymphocyte #: 0.7 10*3/uL — ABNORMAL LOW (ref 1.0–3.6)
MCH: 25 pg — ABNORMAL LOW (ref 26.0–34.0)
MCHC: 31.4 g/dL — ABNORMAL LOW (ref 32.0–36.0)
MCV: 80 fL (ref 80–100)
Monocyte #: 0.4 x10 3/mm (ref 0.2–1.0)
Monocyte %: 5.9 %
Neutrophil #: 6.4 10*3/uL (ref 1.4–6.5)
RBC: 4.81 10*6/uL (ref 4.40–5.90)
RDW: 16.2 % — ABNORMAL HIGH (ref 11.5–14.5)
WBC: 7.5 10*3/uL (ref 3.8–10.6)

## 2012-03-29 LAB — BASIC METABOLIC PANEL
Anion Gap: 12 (ref 7–16)
Calcium, Total: 8.2 mg/dL — ABNORMAL LOW (ref 8.5–10.1)
Chloride: 102 mmol/L (ref 98–107)
Creatinine: 1.7 mg/dL — ABNORMAL HIGH (ref 0.60–1.30)
EGFR (Non-African Amer.): 46 — ABNORMAL LOW
Glucose: 98 mg/dL (ref 65–99)
Osmolality: 285 (ref 275–301)
Potassium: 3.7 mmol/L (ref 3.5–5.1)
Sodium: 137 mmol/L (ref 136–145)

## 2012-03-29 LAB — PROTIME-INR: INR: 1.2

## 2012-03-29 LAB — LIPID PANEL
Ldl Cholesterol, Calc: 76 mg/dL (ref 0–100)
VLDL Cholesterol, Calc: 20 mg/dL (ref 5–40)

## 2012-03-30 LAB — CBC WITH DIFFERENTIAL/PLATELET
Basophil %: 0.2 %
Eosinophil #: 0.1 10*3/uL (ref 0.0–0.7)
Eosinophil %: 1.6 %
HCT: 33.3 % — ABNORMAL LOW (ref 40.0–52.0)
MCHC: 31.7 g/dL — ABNORMAL LOW (ref 32.0–36.0)
MCV: 80 fL (ref 80–100)
Monocyte %: 9.8 %
Neutrophil %: 78.8 %
Platelet: 284 10*3/uL (ref 150–440)
RDW: 15.9 % — ABNORMAL HIGH (ref 11.5–14.5)
WBC: 8.3 10*3/uL (ref 3.8–10.6)

## 2012-03-30 LAB — COMPREHENSIVE METABOLIC PANEL
Albumin: 1.8 g/dL — ABNORMAL LOW (ref 3.4–5.0)
BUN: 46 mg/dL — ABNORMAL HIGH (ref 7–18)
Creatinine: 1.59 mg/dL — ABNORMAL HIGH (ref 0.60–1.30)
Glucose: 222 mg/dL — ABNORMAL HIGH (ref 65–99)
Osmolality: 294 (ref 275–301)
SGPT (ALT): 60 U/L
Sodium: 138 mmol/L (ref 136–145)
Total Protein: 5.1 g/dL — ABNORMAL LOW (ref 6.4–8.2)

## 2012-03-30 LAB — PROTIME-INR: Prothrombin Time: 16.1 secs — ABNORMAL HIGH (ref 11.5–14.7)

## 2012-03-31 LAB — CBC WITH DIFFERENTIAL/PLATELET
Eosinophil #: 0.1 10*3/uL (ref 0.0–0.7)
Eosinophil %: 1 %
HCT: 34.9 % — ABNORMAL LOW (ref 40.0–52.0)
HGB: 11.1 g/dL — ABNORMAL LOW (ref 13.0–18.0)
Lymphocyte %: 6.6 %
MCH: 25.3 pg — ABNORMAL LOW (ref 26.0–34.0)
MCV: 79 fL — ABNORMAL LOW (ref 80–100)
Monocyte #: 1 x10 3/mm (ref 0.2–1.0)
Neutrophil %: 83.9 %
Platelet: 298 10*3/uL (ref 150–440)
RDW: 15.6 % — ABNORMAL HIGH (ref 11.5–14.5)
WBC: 11.8 10*3/uL — ABNORMAL HIGH (ref 3.8–10.6)

## 2012-03-31 LAB — PROTIME-INR
INR: 1.4
Prothrombin Time: 17.4 secs — ABNORMAL HIGH (ref 11.5–14.7)

## 2012-03-31 LAB — CREATININE, SERUM
Creatinine: 1.35 mg/dL — ABNORMAL HIGH (ref 0.60–1.30)
EGFR (Non-African Amer.): >60

## 2012-04-01 LAB — CBC WITH DIFFERENTIAL/PLATELET
Eosinophil #: 0.3 10*3/uL (ref 0.0–0.7)
Eosinophil %: 2.4 %
HCT: 33.7 % — ABNORMAL LOW (ref 40.0–52.0)
MCH: 25.3 pg — ABNORMAL LOW (ref 26.0–34.0)
MCV: 79 fL — ABNORMAL LOW (ref 80–100)
Monocyte #: 1 x10 3/mm (ref 0.2–1.0)
Monocyte %: 8.8 %
Neutrophil #: 9.1 10*3/uL — ABNORMAL HIGH (ref 1.4–6.5)
Neutrophil %: 80.5 %
Platelet: 276 10*3/uL (ref 150–440)
RBC: 4.26 10*6/uL — ABNORMAL LOW (ref 4.40–5.90)

## 2012-04-01 LAB — PROTIME-INR: Prothrombin Time: 24.4 secs — ABNORMAL HIGH (ref 11.5–14.7)

## 2012-04-02 LAB — BASIC METABOLIC PANEL
Calcium, Total: 8 mg/dL — ABNORMAL LOW (ref 8.5–10.1)
Chloride: 105 mmol/L (ref 98–107)
Co2: 27 mmol/L (ref 21–32)
Creatinine: 1.31 mg/dL — ABNORMAL HIGH (ref 0.60–1.30)
EGFR (African American): >60
EGFR (Non-African Amer.): >60
Glucose: 273 mg/dL — ABNORMAL HIGH (ref 65–99)
Sodium: 139 mmol/L (ref 136–145)

## 2012-04-02 LAB — CBC WITH DIFFERENTIAL/PLATELET
Basophil #: 0 10*3/uL (ref 0.0–0.1)
Basophil %: 0.4 %
Eosinophil #: 0.4 10*3/uL (ref 0.0–0.7)
HCT: 34.5 % — ABNORMAL LOW (ref 40.0–52.0)
HGB: 11.1 g/dL — ABNORMAL LOW (ref 13.0–18.0)
Lymphocyte %: 11.8 %
MCH: 25.3 pg — ABNORMAL LOW (ref 26.0–34.0)
MCHC: 32.2 g/dL (ref 32.0–36.0)
MCV: 79 fL — ABNORMAL LOW (ref 80–100)
Monocyte #: 0.6 x10 3/mm (ref 0.2–1.0)
Monocyte %: 7.5 %
Neutrophil %: 75.6 %
RBC: 4.38 10*6/uL — ABNORMAL LOW (ref 4.40–5.90)
WBC: 8.2 10*3/uL (ref 3.8–10.6)

## 2012-04-03 LAB — PROTIME-INR: INR: 3.5

## 2012-04-04 LAB — BASIC METABOLIC PANEL
Anion Gap: 8 (ref 7–16)
Chloride: 108 mmol/L — ABNORMAL HIGH (ref 98–107)
EGFR (African American): >60
EGFR (Non-African Amer.): >60
Osmolality: 290 (ref 275–301)

## 2012-04-04 LAB — PROTIME-INR
INR: 3.2
Prothrombin Time: 32.5 secs — ABNORMAL HIGH (ref 11.5–14.7)

## 2012-04-05 LAB — PROTIME-INR: Prothrombin Time: 26 secs — ABNORMAL HIGH (ref 11.5–14.7)

## 2012-04-21 ENCOUNTER — Other Ambulatory Visit (INDEPENDENT_AMBULATORY_CARE_PROVIDER_SITE_OTHER): Payer: Self-pay | Admitting: Internal Medicine

## 2012-04-21 ENCOUNTER — Ambulatory Visit (INDEPENDENT_AMBULATORY_CARE_PROVIDER_SITE_OTHER): Payer: Commercial Managed Care - PPO | Admitting: Internal Medicine

## 2012-04-21 ENCOUNTER — Encounter (INDEPENDENT_AMBULATORY_CARE_PROVIDER_SITE_OTHER): Payer: Self-pay | Admitting: Internal Medicine

## 2012-04-21 ENCOUNTER — Telehealth (INDEPENDENT_AMBULATORY_CARE_PROVIDER_SITE_OTHER): Payer: Self-pay

## 2012-04-21 VITALS — BP 179/109 | HR 112 | Temp 98.4°F | Wt 195.0 lb

## 2012-04-21 LAB — COMPREHENSIVE METABOLIC PANEL
ALT (GPT): 64 U/L — ABNORMAL HIGH (ref 10–48)
AST (GOT): 78 U/L — ABNORMAL HIGH (ref 15–40)
Albumin: 4.7 g/dL (ref 3.5–5.2)
Alkaline Phosphatase (Total): 98 U/L (ref 39–139)
Anion Gap: 14 — ABNORMAL HIGH (ref 3–11)
Bilirubin (Total): 1 mg/dL (ref 0.2–1.3)
Calcium: 10.3 mg/dL — ABNORMAL HIGH (ref 8.9–10.2)
Carbon Dioxide, Total: 29 mEq/L (ref 22–32)
Chloride: 93 mEq/L — ABNORMAL LOW (ref 98–108)
Creatinine: 0.83 mg/dL (ref 0.51–1.18)
GFR, Calc, African American: >60 mL/min (ref >59)
GFR, Calc, European American: >60 mL/min (ref >59)
Glucose: 371 mg/dL — ABNORMAL HIGH (ref 62–125)
Potassium: 5 mEq/L (ref 3.7–5.2)
Protein (Total): 8.4 g/dL — ABNORMAL HIGH (ref 6.0–8.2)
Sodium: 136 mEq/L (ref 136–145)
Urea Nitrogen: 9 mg/dL (ref 8–21)

## 2012-04-21 LAB — 1ST EXTRA GOLD TOP

## 2012-04-21 LAB — CBC, DIFF
% Basophils: 0 % (ref 0–1)
% Eosinophils: 1 % (ref 0–7)
% Immature Granulocytes: 0 % (ref 0–1)
% Lymphocytes: 25 % (ref 19–53)
% Monocytes: 7 % (ref 5–13)
% Neutrophils: 67 % (ref 34–71)
Absolute Eosinophil Count: 0.04 10*3/uL (ref 0.00–0.50)
Absolute Lymphocyte Count: 1.87 10*3/uL (ref 1.00–4.80)
Basophils: 0.03 10*3/uL (ref 0.00–0.20)
Hematocrit: 44 % (ref 38–50)
Hemoglobin: 14.2 g/dL (ref 13.0–18.0)
Immature Granulocytes: 0.02 10*3/uL (ref 0.00–0.05)
MCH: 28.2 pg (ref 27.3–33.6)
MCHC: 32.5 g/dL (ref 32.2–36.5)
MCV: 87 fL (ref 81–98)
Monocytes: 0.52 10*3/uL (ref 0.00–0.80)
Neutrophils: 4.88 10*3/uL (ref 1.80–7.00)
Platelet Count: 208 10*3/uL (ref 150–400)
RBC: 5.04 mil/uL (ref 4.40–5.60)
RDW-CV: 13 % (ref 11.6–14.4)
WBC: 7.36 10*3/uL (ref 4.3–10.0)

## 2012-04-21 LAB — LIPID PANEL
Cholesterol (LDL): 130 mg/dL — ABNORMAL HIGH (ref <130)
Cholesterol/HDL Ratio: 3.3
HDL Cholesterol: 71 mg/dL (ref >40)
Non-HDL Cholesterol: 163 mg/dL — ABNORMAL HIGH (ref 0–159)
Total Cholesterol: 234 mg/dL — ABNORMAL HIGH (ref <200)
Triglyceride: 163 mg/dL — ABNORMAL HIGH (ref <150)

## 2012-04-21 LAB — THYROID STIMULATING HORMONE: Thyroid Stimulating Hormone: 0.629 u[IU]/mL (ref 0.400–5.000)

## 2012-04-21 LAB — ALBUMIN/CREATININE RATIO, RANDOM URINE
Albumin (Micro), URN: 19.1 mg/dL
Albumin/Creatinine Ratio, URN: 169 mg/g Creat — ABNORMAL HIGH (ref <30)
Creatinine/Unit, URN: 113 mg/dL

## 2012-04-21 LAB — PR GLUCOSE, WHOLE BLOOD, ONSITE: Glucose: 353 mg/dl — ABNORMAL HIGH (ref 62–125)

## 2012-04-21 MED ORDER — BLOOD GLUCOSE TEST VI STRP
ORAL_STRIP | Status: DC
Start: 2012-04-21 — End: 2013-11-05

## 2012-04-21 MED ORDER — SERTRALINE HCL 50 MG OR TABS
ORAL_TABLET | ORAL | Status: DC
Start: 2012-04-21 — End: 2012-12-08

## 2012-04-21 MED ORDER — PEN NEEDLES 31G X 6 MM MISC
Status: DC
Start: 2012-04-21 — End: 2012-04-28

## 2012-04-21 MED ORDER — LANCETS THIN MISC
Status: DC
Start: 2012-04-21 — End: 2013-09-21

## 2012-04-21 MED ORDER — BLOOD GLUCOSE MONITOR KIT
PACK | Status: DC
Start: 2012-04-21 — End: 2015-10-24

## 2012-04-21 MED ORDER — INSULIN GLARGINE 100 UNIT/ML SC SOLN
SUBCUTANEOUS | Status: DC
Start: 2012-04-21 — End: 2012-04-28

## 2012-04-21 NOTE — Telephone Encounter (Signed)
Check in with new diabetic on Tuesday or Wednesday.

## 2012-04-21 NOTE — Progress Notes (Signed)
CHIEF COMPLAINT:  Roger [name] is a 51 year old male here to discuss Urine Problem  .    HISTORY OF PRESENT ILLNESS OR INTERVAL HISTORY:  History of pre-diabetes.  He did well with diet and exercise.  He has been to group health since not caring for himself as well.  Over the last 6 weeks, he has been having frequent urination at night, dry mouth.    Pt has lost 10-11 lb in about one months.  Pt has hard time sleeping.  He is drinking more because of his wife's illness.  Over the last 6 weeks, he has been drinking more (1/2 gallon per week).    Colonoscopy done 2 years ago at [facility]. Was told to f/u in 5 years.  He is concerned that he could have cancer.  He feels like he is going to die.  His brother has history of stomach cancer.  He would like to be checked for cancer as well.         Review of Systems   Constitutional: Negative.    Respiratory: Negative.    Cardiovascular: Negative.    Genitourinary: Positive for frequency.   Musculoskeletal: Negative.    Psychiatric/Behavioral: The patient is nervous/anxious.          PROBLEM LIST:  Reviewed and updated in the electronic medical record under Problem List.    This includes pertinent past medical and surgical history.    Patient Active Problem List    Diagnosis Date Noted   . 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 FAMILY HX GI MALIGNANCY [V16.0] 05/01/2002   . HYPERCALCEMIA [275.42] 06/11/2001   . ELEV TRANSAMINASE/LDH [790.4] 06/11/2001   . HYPERTENSION NOS [401.9] 06/02/2001         MEDICATIONS:  Reviewed and updated in the electronic medical record under Medications.      ALLERGIES:  Reviewed and updated in the electronic medical record under Allergies.      SOCIAL HISTORY:  Reviewed and updated in the electronic medical record under Social History.    FAMILY HISTORY:  Reviewed and updated in the electronic medical record under Family History.          Physical Exam   Constitutional: He is oriented to person, place,  and time. He appears well-developed and well-nourished. No distress.   Eyes: Conjunctivae normal are normal. Pupils are equal, round, and reactive to light.   Cardiovascular: Normal rate, regular rhythm and normal heart sounds.  Exam reveals no gallop and no friction rub.    No murmur heard.  Pulmonary/Chest: Effort normal and breath sounds normal. No respiratory distress. He has no wheezes. He has no rales.   Abdominal: Soft. Bowel sounds are normal.   Neurological: He is alert and oriented to person, place, and time.   Skin: Skin is warm and dry. He is not diaphoretic.   Psychiatric: His behavior is normal. Judgment and thought content normal.        anxious         ASSESSMENT AND PLAN:  250.00 Diabetes mellitus  (primary encounter diagnosis)  POC glucose 351, so pt has diabetes.  He is likely >250 most of the time to exhibit these symptoms and was given the option of starting orals or insulin.  He would like to start insulin, so glargine started at 10 units per day.  Counseling done by nursing.  Pt given glucometer and discussed signs and symptoms of  low blood sugar.  We will meet in one week for adjustment.        HTN:  Did not take meds today, advised to take meds regularly and will recheck in 1 week.      300.00 Anxiety  Checking thyroid, but also starting sertraline.  He has started counseling and was advised to continue this.      Alcohol abuse:  Discussed signs and symptoms of w/d.  He does not exhibit any today, but discussed slowly decreasing drinking and stopping in order to avoid w/d.      Cancer screening:  Pt is very anxious at this point, important to get other issues in order first, then concentrate on cancer screening.

## 2012-04-21 NOTE — Progress Notes (Signed)
RN met with Roger [name].  We reviewed "Living with Diabetes Magazine" and he was given additional handouts on hypo and hyper glycemia.  We discussed need to decrease carb intake in diet and portion control.  He intends to start working out daily - has a treadmill that he can use.  Encouraged to cut back on the gin and orange juice that he drinks on a daily basis.  He will start to eat healthier.    Roger [name] was provided with a Contour Next Meter.  He was able to test his own blood sugar.  RBS was 385.  Patient was also shown how to use an insulin pen.      Upon further discussion with patient, he is not fully on board with starting insulin.  He was encouraged to pick up the pen to have on hand.  Advised he could try diet and exercise for a day or two to see if he's able to lower blood sugars on his own.    RN will check status with patient on Tues/Wednesday.  He reports to be always available on his cell phone.  States gets home around 4:30pm  RN will also meet with him on Friday in clinic.

## 2012-04-24 LAB — HEMOGLOBIN A1C, HPLC: Hemoglobin A1C: 13.6 % — ABNORMAL HIGH (ref 4.0–6.0)

## 2012-04-25 ENCOUNTER — Telehealth (INDEPENDENT_AMBULATORY_CARE_PROVIDER_SITE_OTHER): Payer: Self-pay

## 2012-04-26 ENCOUNTER — Telehealth (INDEPENDENT_AMBULATORY_CARE_PROVIDER_SITE_OTHER): Payer: Self-pay | Admitting: Internal Medicine

## 2012-04-26 MED ORDER — METFORMIN HCL 500 MG OR TABS
ORAL_TABLET | ORAL | Status: DC
Start: 2012-04-26 — End: 2012-12-08

## 2012-04-26 NOTE — Telephone Encounter (Addendum)
F/U DM.  FSBS 04/23/12 pm = 315.   04/24/12 am = 265.  Pt is not willing to start insulin.  Wants to decrease BS by diet and exercise.  Is willing to see M. Chomko.  States he understands that these are high numbers.  Wonders if he can take pills instead.  States he has appt with Dr Carmina Miller on Friday but I do not see in EPIC - please advise.  Note of 04/23/12 states, "we will meet in 1 week for adjustment."        04/23/12 - ASSESSMENT AND PLAN:   250.00 Diabetes mellitus (primary encounter diagnosis)   POC glucose 351, so pt has diabetes. He is likely >250 most of the time to exhibit these symptoms and was given the option of starting orals or insulin. He would like to start insulin, so glargine started at 10 units per day. Counseling done by nursing. Pt given glucometer and discussed signs and symptoms of low blood sugar. We will meet in one week for adjustment.

## 2012-04-26 NOTE — Telephone Encounter (Signed)
Spoke with [employer]  He will start metformin and come in on Friday as below.  He would also like to be scheduled for nutrition consult.

## 2012-04-26 NOTE — Telephone Encounter (Signed)
Will start metformin since not interested in insulin at this time.    Starting metformin slowly, OK to add to schedule per pt's preferred time on Friday.

## 2012-04-26 NOTE — Telephone Encounter (Signed)
See TE 04/21/12.  Appt made with Dr Carmina Miller 04/28/12 11:45 am.  Pt notified.

## 2012-04-28 ENCOUNTER — Ambulatory Visit (INDEPENDENT_AMBULATORY_CARE_PROVIDER_SITE_OTHER): Payer: Commercial Managed Care - PPO | Admitting: Internal Medicine

## 2012-04-28 ENCOUNTER — Encounter (INDEPENDENT_AMBULATORY_CARE_PROVIDER_SITE_OTHER): Payer: Self-pay | Admitting: Internal Medicine

## 2012-04-28 ENCOUNTER — Telehealth (INDEPENDENT_AMBULATORY_CARE_PROVIDER_SITE_OTHER): Payer: Self-pay | Admitting: Internal Medicine

## 2012-04-28 VITALS — BP 154/101 | HR 93 | Temp 97.4°F | Wt 196.0 lb

## 2012-04-28 MED ORDER — SILDENAFIL CITRATE 100 MG OR TABS
ORAL_TABLET | ORAL | Status: DC
Start: 2012-04-28 — End: 2012-04-28

## 2012-04-28 MED ORDER — LISINOPRIL 10 MG OR TABS
ORAL_TABLET | ORAL | Status: DC
Start: 2012-04-28 — End: 2012-07-30

## 2012-04-28 MED ORDER — SILDENAFIL CITRATE 100 MG OR TABS
ORAL_TABLET | ORAL | Status: DC
Start: 2012-04-28 — End: 2012-12-08

## 2012-04-28 NOTE — Patient Instructions (Signed)
Check blood sugar 3 times per week in the morning before breakfast  Start metformin first for diabetes  Wait a week, then start sertraline for anxiety  Start lisinopril today for kidney function.

## 2012-04-28 NOTE — Progress Notes (Signed)
Pt is here to discuss diabetes Follow-Up.   Pt reports blood sugar being in the 300's

## 2012-04-28 NOTE — Telephone Encounter (Signed)
CONFIRMED PHONE NUMBER: [phone]  CALLERS FIRST AND LAST NAME: Clabe Seal  FACILITY NAME: na TITLE: na  CALLERS RELATIONSHIP:Self  RETURN CALL: No call back needed    SUBJECT: General Message   REASON FOR REQUEST: Patient is calling to report [facility] has not yet received his referral. He is calling to request Dr. Deirdre Pippins office fax that over as soon as possible. Thank you. -Maggie x [phone]    MESSAGE: See above

## 2012-04-29 NOTE — Progress Notes (Signed)
CHIEF COMPLAINT:  Roger [name] is a 51 year old male here to discuss Diabetes and Follow-Up   .    HISTORY OF PRESENT ILLNESS OR INTERVAL HISTORY:  Pt doing better, not as anxious.    He has not started insulin, decided he wanted to do metformin instead and diet and exercise.  He has been able to cut down on drinking.  He is eating better and exercised 3 times this week.  He took amlodipine this morning, BP is improved.  He has checked BG and is in the lower 300s for the most part.    He is anxious about side effects of sertraline.      Review of Systems   Constitutional: Negative.    Respiratory: Negative.    Psychiatric/Behavioral: Negative.          PROBLEM LIST:  Reviewed and updated in the electronic medical record under Problem List.    This includes pertinent past medical and surgical history.    Patient Active Problem List    Diagnosis Date Noted   . 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 FAMILY HX GI MALIGNANCY [V16.0] 05/01/2002   . HYPERCALCEMIA [275.42] 06/11/2001   . ELEV TRANSAMINASE/LDH [790.4] 06/11/2001   . HYPERTENSION NOS [401.9] 06/02/2001         MEDICATIONS:  Reviewed and updated in the electronic medical record under Medications.      ALLERGIES:  Reviewed and updated in the electronic medical record under Allergies.      SOCIAL HISTORY:  Reviewed and updated in the electronic medical record under Social History.    FAMILY HISTORY:  Reviewed and updated in the electronic medical record under Family History.        Physical Exam   Constitutional: He appears well-developed and well-nourished. No distress.   Cardiovascular: Normal rate, regular rhythm and normal heart sounds.  Exam reveals no gallop and no friction rub.    No murmur heard.  Pulmonary/Chest: Effort normal and breath sounds normal.   Skin: He is not diaphoretic.   Psychiatric:        Improved anxiety.         Reviewed labs today      ASSESSMENT AND PLAN:  401.9 Unspecified essential hypertension     Improved today, but will need to add lisinopril as he has nephropathy.  \  Added 10mg  lisinopril.      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  Important to decrease alcohol intake  Interested in nutritionist, which is a good idea.    He would like to try diet/exercise, and pills first, which is reasonable, but will be difficult to reach goal without insulin.  Nursing to follow.    Will likely add simvastatin at next visit for goal LDL of <100    Anxiety,   Encouraged he start  Sertraline.

## 2012-05-01 NOTE — Telephone Encounter (Signed)
referral faxed to [facility].  Patient can call [phone] to schedule.

## 2012-05-03 ENCOUNTER — Telehealth (INDEPENDENT_AMBULATORY_CARE_PROVIDER_SITE_OTHER): Payer: Self-pay | Admitting: Internal Medicine

## 2012-05-03 NOTE — Telephone Encounter (Signed)
Spoke with [facility]  Glucometer is not working for him.  He will bring it to the clinic and we will try to troubleshoot.

## 2012-05-03 NOTE — Telephone Encounter (Signed)
CONFIRMED PHONE NUMBER: [phone]  CALLERS FIRST AND LAST NAME: Clabe Seal    FACILITY NAME:  TITLE:   CALLERS RELATIONSHIP:Self  RETURN CALL: General message OK    SUBJECT: General Message   REASON FOR REQUEST: PT IS REQUESTING A CALL BACK RE: HIS DIABETES, HE IS HAVING TROUBLE WITH READING MACHINE HE IS USING TO TAKE HIS BLOOD SUGAR READING.    MESSAGE: PT CAN BE REACHED ON HIS CELL PHONE AT [phone]

## 2012-05-05 NOTE — Telephone Encounter (Signed)
Spoke with Roger [name] - he's figured out what he was doing wrong.

## 2012-05-10 ENCOUNTER — Ambulatory Visit (INDEPENDENT_AMBULATORY_CARE_PROVIDER_SITE_OTHER): Payer: Commercial Managed Care - PPO | Admitting: Internal Medicine

## 2012-05-16 ENCOUNTER — Ambulatory Visit (INDEPENDENT_AMBULATORY_CARE_PROVIDER_SITE_OTHER): Payer: Commercial Managed Care - PPO | Admitting: Registered"

## 2012-05-23 ENCOUNTER — Encounter (INDEPENDENT_AMBULATORY_CARE_PROVIDER_SITE_OTHER): Payer: Self-pay | Admitting: Registered"

## 2012-05-23 ENCOUNTER — Ambulatory Visit (INDEPENDENT_AMBULATORY_CARE_PROVIDER_SITE_OTHER): Payer: Commercial Managed Care - PPO | Admitting: Registered"

## 2012-05-23 NOTE — Progress Notes (Signed)
Roger [name] was referred for Medical Nutrition Therapy by Mindi Slicker    NUTRITION ASSESSMENT:     Reason for visit:   found [facility] that [facility] DM[facility]; doesn't want to believe  Wants to figure out how to eat to manage DM, bring it down   Notes that he has vices: high stress, and drinks too much.     Lab Data:  (5/10) A1c 13.6; CHOL 234, LDL 130, HDL 71, TG 163, BG 371    See PMH and med list.    Anthropometrics:   Ht: 5'9"    Wt: 196 # (5/17)  BMI: 28.9 (overweight)    Wt Hx: 201# (2011); 215# (2004)    Lifestyle:  Work/schedule/commute: [place] [employer] for compliance, lots of walking the docks/lots of ramps/ladders but also riding golf carts at times/desk work; 40hrs/wk or less for appointments. Schedule changes so much. Commute easy, 20-30 min.  Other obligations: wife has CR[employer], on disability--nerve condition/pain centers. Helps her out with meds/drs appts/stress mgmnt/flare ups when weather is hot.    Social: pt does most of cooking. Youngest son (22) in house; dog. Older son, 15yrs in [place].      Exercise:  stopped for 3 months. Last month only 4x in 4 wks. Starting new routine, wants to do exercise 3x/wk. Treadmill at home, going to buy elyptical soon.   Has been exercising-- wants to add more to it, adding in weights. pcp discouraging 2/2 HTN. When exercising HTN/chol/DM numbers go low.   Likes a "nice healthy sweat."  40 min treadmill, then does core exercises.   Currently Treadmill  Push ups and core exercises (planks)  Wants to incorporate weight training.  Used to work out pretty regularly-- body was hungry for it. Got so stressed out about stuff that happened and stopped. Never really quit 100%, now a new motivation.   On 11AM days, work out at [employer]  On 7-3pm days, unsure when he's going to add it in.     Diet:     Food intolerances:  NKA, lactose intolerant to milk to drink  Food aversions: denies, hummus?     Working tues/weds 7-am-6pm  Ths/fri/sat  11-7pm  Next month 7am -3pm for rest of summer.     Recall (weekdays):   Wakes up: 6AM  On way to work 6:30/6:45AM -- banana or apple (but should he do this, because it has sugar?)   10AM: grapes OR breakfast bar OR couple bites of sandwich (lean meats like deli turkey/roast beef on wheat bread + cheese)   12PM: rest of sandwich/half sandwich OR leftovers from dinner  Afternoon: peanuts  1-2 drinks (gin and used to be orange juice-- now has vodka and coke zero) with occasional meat rolled up in pc bread/bite of leftovers. Start cooking dinner 5PM   6/6:30PM: spaghetti (beef & sausage + tomatoes + salsa [corn/beans] +onions sauce) + cheese and corn OR baked chicken/fish/crab + 1 cup rice and  1 cup vegetables. last night pcs ezells fried chicken + potato salad. Water.     Drinks: no more juice, coke zero, water, diet pops, some beer or drink with dinner   Snacks: fruit, peanuts, small bag cheetos/potato chips    (Weekends):  Sleep in. 11am/12pm wakes up. Eats something in AM-- cereal / apple or banana, or egg and toast. Tea &lemon/coffee with sugar.   1-2pm: Sandwich    Exercise in afternoon.   Same  dinners as above. Going to start bbq'ing more.    Grocery shopping/frequency/responsibility: self and pt; mostly wife;  Frequency of eating out: 2x/month--   Alcohol intake: typical day, about 3-5 short drinks. Per pcp note, 1/2 gallon of etoh weekly-- gin + OJ preferred drink  Vitamin, mineral or herbal supplements: alive! MVI and fish oil tablets    BG Management:  On metformin; checks BG sporadically.  Numbers are always fluctuating -- in evenings they're at their lowest (104-113). In AM they're 150 mg/dL. Hasn't been over 150mg /dL in a while.   "i should check them every morning, huh?"  Checks when feels like should be checking it. avg 3x/wk. Latest checked it's been 6pm; usaully between 2-6pm.   Concerned that once you have DM2, you always have it. Though also heard it's reversible. Worried about complications of DM,  can you die from it?    NUTRITION DIAGNOSIS: Food and nutrition related knowledge deficit related to nutrition and lifestyle recommendations for management of multiple medical issues including DM, HTN.    Diagnostic Reasoning: Pt is a middle aged overweight man with DM and HTN; limited knowledge of DM, particularly diet/lifestyle interventions. Checking BG sporadically, stating usual numbers <150, despite A1c 13.6 c/w avg BG 340's. Pt is taking metformin, and building up exercise. Has an active job and is starting to get back into a regular exercise routine at home of cardio + body weight training/core exercises. Pt looks at portions of sugar/CHO containing foods and is limiting sizes of these to decrease BG, though unsure of all the sources of CHO. Despite good dietary changes, pt has higher levels of stress and etoH intake which may affect BG levels long term. Good affect today, and appears motivated to prevent progression of DM.    NUTRITION INTERVENTION:  - Explained diabetes physiology, insulin resistance, risk factors for DM progression and what we can do for delay/prevention of progression. Disc complications of uncontrolled BG, and   - Explained role of diet and activity, stressed importance of good blood glucose control/checking BG regularly. Educated on times to check and what numbers mean/ disc what PCP wants pt to aim for.  - Educated on consumption of juices/sodas/juice-drinks and what liquid calories do to our blood sugar. Discussed alternatives. Educated on effect of etOH on BG.   - Discussed what foods have and do not have CHO in them, portions and difference between simple/complex CHO.  - Suggested balancing CHO at each meal. Explained the plate method and reviewed pt's meals and how these would fit into plate method  - Reviewed vits/supplements; encouraged continuing MVI; disc mixed science on fish oil, and encouraged getting 30+ min of sunshine daily at work.   - Discussed mechanisms behind  aerobic exercise, why it is beneficial for DM prevention/delay. Encouraged his plan to build up exercise; disc recommendations and motivations.  - Rec'd incorporate strength training and muscle building for improved utilization of BG. Provided list of body weight exercises to incorporate; disc that heavy lifting is not needed.    MONITORING/EVALUATION:   Hashem's Goals:   1. Check BG q morning. Bring log into clinic appts  2. Optional checking 1-2 hrs after meals to eval how foods affect BG  3. Continue cutting out sweets/juices/pops; limit portions of other CHO-containing foods  4. Build meals around CHO-- protein/fiber/fats  5. 30 min of exercise 30x/wk as well as building activity into day  6. [facility]care signup/msg prn       I spent 60 minutes with  this patient obtaining history and providing education and counseling.    Ignacia Marvel, RD,CD

## 2012-06-26 ENCOUNTER — Telehealth (INDEPENDENT_AMBULATORY_CARE_PROVIDER_SITE_OTHER): Payer: Self-pay | Admitting: Unknown Physician Specialty

## 2012-06-26 NOTE — Telephone Encounter (Signed)
Pt states BS from 97-134.  Average 125.   Takes BS every am & a couple times a week he does random in the evening.    Taking Metformin as prescribed.  States he is doing well with his diet and learned a lot from his appt with nutritionist.    He will schedule an appt for mid August.

## 2012-07-30 ENCOUNTER — Other Ambulatory Visit: Payer: Self-pay | Admitting: Internal Medicine

## 2012-07-30 MED ORDER — LISINOPRIL 10 MG OR TABS
ORAL_TABLET | ORAL | Status: DC
Start: 2012-07-30 — End: 2012-12-08

## 2012-07-30 NOTE — Telephone Encounter (Signed)
appt scheduled for 08/28/12

## 2012-08-03 ENCOUNTER — Other Ambulatory Visit (INDEPENDENT_AMBULATORY_CARE_PROVIDER_SITE_OTHER): Payer: Self-pay | Admitting: Internal Medicine

## 2012-08-03 NOTE — Telephone Encounter (Signed)
Pt is out of this medication, has appt scheduled for 9/16 but needs this refilled before the appt.

## 2012-08-04 NOTE — Telephone Encounter (Signed)
Refill authorized on 07/30/12.  Patient should check with the pharmacy to find out when the prescription will be ready for pick up.

## 2012-08-04 NOTE — Telephone Encounter (Signed)
LM on VM that Rx was [facility] to [facility] in [place] on 8/18 and to call pharmacy for refill.

## 2012-08-11 ENCOUNTER — Ambulatory Visit (INDEPENDENT_AMBULATORY_CARE_PROVIDER_SITE_OTHER): Payer: Commercial Managed Care - PPO | Admitting: Family Practice

## 2012-08-17 ENCOUNTER — Ambulatory Visit: Payer: Self-pay | Admitting: Gastroenterology

## 2012-08-28 ENCOUNTER — Ambulatory Visit: Payer: Self-pay | Admitting: Cardiology

## 2012-08-28 ENCOUNTER — Ambulatory Visit (INDEPENDENT_AMBULATORY_CARE_PROVIDER_SITE_OTHER): Payer: Commercial Managed Care - PPO | Admitting: Internal Medicine

## 2012-08-28 LAB — URINALYSIS, COMPLETE
Hyaline Cast: 3
Leukocyte Esterase: NEGATIVE
Nitrite: NEGATIVE
Ph: 5 (ref 4.5–8.0)
RBC,UR: 16 /HPF (ref 0–5)
Specific Gravity: 1.015 (ref 1.003–1.030)
Squamous Epithelial: <1

## 2012-08-28 LAB — CBC WITH DIFFERENTIAL/PLATELET
Basophil #: 0.1 10*3/uL (ref 0.0–0.1)
Basophil %: 0.7 %
Eosinophil #: 0.3 10*3/uL (ref 0.0–0.7)
HCT: 33.3 % — ABNORMAL LOW (ref 40.0–52.0)
Lymphocyte %: 10.9 %
MCHC: 32.3 g/dL (ref 32.0–36.0)
Monocyte #: 0.8 x10 3/mm (ref 0.2–1.0)
Neutrophil #: 7.7 10*3/uL — ABNORMAL HIGH (ref 1.4–6.5)
Neutrophil %: 77.8 %
RBC: 4.52 10*6/uL (ref 4.40–5.90)
RDW: 18.1 % — ABNORMAL HIGH (ref 11.5–14.5)

## 2012-08-28 LAB — BASIC METABOLIC PANEL
Anion Gap: 7 (ref 7–16)
Calcium, Total: 9.1 mg/dL (ref 8.5–10.1)
Chloride: 101 mmol/L (ref 98–107)
EGFR (African American): >60
EGFR (Non-African Amer.): 59 — ABNORMAL LOW
Osmolality: 285 (ref 275–301)
Sodium: 137 mmol/L (ref 136–145)

## 2012-08-28 LAB — APTT: Activated PTT: 38.5 secs — ABNORMAL HIGH (ref 23.6–35.9)

## 2012-08-29 ENCOUNTER — Encounter (INDEPENDENT_AMBULATORY_CARE_PROVIDER_SITE_OTHER): Payer: Self-pay | Admitting: Internal Medicine

## 2012-09-01 ENCOUNTER — Ambulatory Visit: Payer: Self-pay | Admitting: Cardiology

## 2012-09-01 LAB — PROTIME-INR: INR: 1

## 2012-09-18 ENCOUNTER — Telehealth (INDEPENDENT_AMBULATORY_CARE_PROVIDER_SITE_OTHER): Payer: Self-pay | Admitting: Unknown Physician Specialty

## 2012-09-18 NOTE — Telephone Encounter (Signed)
PSR, Last DM appt 04/28/12.  No showed 08/28/12.  Please call and schedule f/u DM appt.  Thank you.

## 2012-09-18 NOTE — Telephone Encounter (Signed)
Spoke with patient and needs to check his schedule before making appointment. Will give [place] a call back

## 2012-09-19 NOTE — Telephone Encounter (Signed)
Called patient left voicemail we will call back

## 2012-10-23 ENCOUNTER — Ambulatory Visit (INDEPENDENT_AMBULATORY_CARE_PROVIDER_SITE_OTHER): Payer: Commercial Managed Care - PPO | Admitting: Internal Medicine

## 2012-10-24 ENCOUNTER — Encounter (INDEPENDENT_AMBULATORY_CARE_PROVIDER_SITE_OTHER): Payer: Self-pay | Admitting: Internal Medicine

## 2012-11-23 ENCOUNTER — Telehealth (INDEPENDENT_AMBULATORY_CARE_PROVIDER_SITE_OTHER): Payer: Self-pay

## 2012-11-23 NOTE — Telephone Encounter (Signed)
AIC 13.  Has appt with Dr Barbette Reichmann on 12/27 at 3pm.

## 2012-12-08 ENCOUNTER — Encounter (INDEPENDENT_AMBULATORY_CARE_PROVIDER_SITE_OTHER): Payer: Self-pay | Admitting: Internal Medicine

## 2012-12-08 ENCOUNTER — Ambulatory Visit (INDEPENDENT_AMBULATORY_CARE_PROVIDER_SITE_OTHER): Payer: Commercial Managed Care - PPO | Admitting: Internal Medicine

## 2012-12-08 VITALS — BP 161/104 | HR 106 | Temp 98.9°F | Ht 67.32 in | Wt 190.0 lb

## 2012-12-08 MED ORDER — LISINOPRIL 10 MG OR TABS
ORAL_TABLET | ORAL | Status: DC
Start: 2012-12-08 — End: 2013-04-03

## 2012-12-08 MED ORDER — OMEPRAZOLE 20 MG OR CPDR
DELAYED_RELEASE_CAPSULE | ORAL | Status: DC
Start: 2012-12-08 — End: 2013-07-23

## 2012-12-08 NOTE — Progress Notes (Signed)
Roger [name] is a 51 year old male here for an annual health maintenance exam. Other concerns today include the following:     Had been going to [name], with insurance change will be coming back to see Dr Carmina Miller.   Diabetes- taking 1000mg  metformin each morning, am sugars 90-130; had an A1c of 5.8 viewable through [facility] at [facility] in October and at that time total cholesterol was 199 with HDL 115 and LDL 75 and triglycerides 43, had been exercising regularly prior to those labs   Frequent urination, elevated PSA  - had an elevated PSA of 12 at [facility] in October, notes frequent urination, was referred to a Urologist but has not yet been seen   Hypertension - out of lisinopril for a week or two, blood pressure elevated today, is taking hte amlodipine   Lump - feels a scrotal lump    Current dietary habits: good   Current exercise habits: lot of exercise at work at the [employer]; was doing treadmill 30 minutes three times a week until 4 months ago    Patient Active Problem List    Diagnosis Date Noted   . FAMILY HX GI MALIGNANCY [V16.0] 05/01/2002   . HYPERCALCEMIA [275.42] 06/11/2001   . ELEV TRANSAMINASE/LDH [790.4] 06/11/2001   . HYPERTENSION NOS [401.9] 06/02/2001   . Anxiety [300.00] 04/29/2012   . 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 Unspecified essential hypertension 1996     on 3 drugs, some side effects from medications.    . Family history of colon cancer      brother died in 91s of colon cancer    . Type II or unspecified type diabetes mellitus without mention of complication, not stated as uncontrolled 2013       Past Surgical History   Procedure Laterality Date   . No prior surgeries     . Colonoscopy  2003     [facility] [address] normal   . Colonoscopy  2011       Family History   Problem Relation Age of Onset   . Hypertension Mother      stroke   . Cancer Brother      colon cancer, died  age 50.         MEDICATIONS:   Current outpatient prescriptions:AmLODIPine Besylate 10 MG OR TABS, Take 1 tablet by mouth daily, Disp: 30 Tab, Rfl: 3;  B-D ULTRAFINE III SHORT PEN 31G X 8 MM Does not apply Misc, for insulin 3 needles a day, Disp: , Rfl: ;  Blood Glucose Monitoring Suppl (BLOOD GLUCOSE MONITOR KIT) Does not apply Kit, one meter for testing 1-3 times per day, Disp: 1 Kit, Rfl: 11  Glucose Blood (BLOOD GLUCOSE TEST STRIPS) In Vitro Strip, test up to 3 times per day, Disp: 100 Strip, Rfl: 11;  Lancets Thin Does not apply Misc, for testing up to 3 times per day, Disp: 200 Package, Rfl: 11;  Lisinopril 10 MG Oral Tab, Take 1 tablet by mouth every day, Disp: 90 Tab, Rfl: 0;  MetFORMIN HCl 500 MG Oral Tab, Take two tablets by mouth daily., Disp: , Rfl:   Multiple Vitamins-Minerals (MULTIVITAMIN & MINERAL OR), 1 tab a day, Disp: , Rfl: ;  Omeprazole 20 MG OR CPDR, take 1 po q day, Disp: 30 Cap, Rfl: 3    History  Social History   . Marital Status: Married     Spouse Name: N/A     Number of Children: N/A   . Years of Education: N/A     Occupational History   . Not on file.     Social History Main Topics   . Smoking status: Never Smoker    . Smokeless tobacco: Not on file   . Alcohol Use: 6.0 oz/week      6 beers/week    . Drug Use: No   . Sexually Active: Yes -- Male partner(s)     Other Topics Concern   . Not on file     Social History Narrative    Date info entered: 05/23/2012    Living situation:  lives w/ wife (medical issues on disability, pt is caretaker for wife) and youngest son (25); older son 27 lives in [place]. High stress taking care of wife. etoh 5 drinks/night on avg    Work for the [employer] in [employer]; active job    Exercise: treadmill at home + core exercises, 3x/wk for 30 min is goal    [employer] or spiritual support: yes                     REVIEW OF SYSTEMS:   Constitutional: Negative    Eyes: Negative    Ears, Nose, Mouth, Throat: Negative    Cardiovascular: Negative    Respiratory:  Negative    Gastrointestinal: Negative   Genitourinary: As noted in HPI above   Musculoskeletal: Negative    Skin: Negative    Neurological: Negative    Psychiatric: Negative    Endocrine: As noted in HPI above   Hematologic/Lymphatic: Negative   Allergic/Immunologic: Negative     PHYSICAL EXAM:  BP 161/104  Pulse 106  Temp(Src) 98.9 F (37.2 C) (Tympanic)  Ht 5' 7.32" (1.71 m)  Wt 190 lb (86.183 kg)  BMI 29.47 kg/m2  SpO2 98%  BP 160/96 on repeat   General: healthy, alert, no distress  Skin: Skin color, texture, turgor normal. No rashes or concerning lesions  Head: Normocephalic. No masses, lesions, tenderness or abnormalities  Eyes: Lids/periorbital skin normal, Conjunctivae/corneas clear, PERRL, EOM's intact  Ears: External ears normal. Canals clear. TM's normal.  Nose:normal  Oropharynx: Lips, mucosa, and tongue normal. Teeth and gums normal., posterior pharynx without erythema or drainage  Neck: supple. No adenopathy. Thyroid symmetric, normal size, without nodules  Lungs: clear to auscultation  Heart: normal rate, regular rhythm and no murmurs, clicks, or gallops  Abd: soft, non-tender. BS normal. No masses or organomegaly  GU: testes are normal, small left spermatic cord cyst or scar tissue   Rectal: deferred  Prostate: Deferred  Ext: Normal, without deformities, edema, or skin discoloration, radial and DP pulses 2+ bilaterally  Neuro:  Grossly normal to observation, gait normal    ASSESSMENT AND PLAN:    1. Routine screening: see orders and instructions     2. Immunizations today: none    3. Preventive counseling: moderation in EtOH, healthy dietary guidelines, proper exercise, wearing of seat belts    This note was generated using a standard template approved by the Medical Director.

## 2012-12-08 NOTE — Patient Instructions (Addendum)
Repeat PSA today. We'll send you to Urology if significantly elevated.   [place] [phone]     Get back to regular exercise.    Limit alcohol to two drinks per day (3oz of liquor).    Resume lisinopril.    Follow up with Dr Carmina Miller in a month or so.

## 2012-12-08 NOTE — Progress Notes (Signed)
Pt. Here for fasting PE, frequent urination, bump in groin area and refill medications.

## 2012-12-09 LAB — PSA, FREE
Prostate Specific Antigen (Free) %: 10 %
Prostate Specific Antigen (Free): 1.26 ng/mL

## 2012-12-09 LAB — PSA, TOTAL & REFLEXIVE FREE: PSA, Diagnostic/Monitoring: 12.55 ng/mL — ABNORMAL HIGH (ref 0.00–4.00)

## 2012-12-11 ENCOUNTER — Telehealth (INDEPENDENT_AMBULATORY_CARE_PROVIDER_SITE_OTHER): Payer: Self-pay | Admitting: Internal Medicine

## 2012-12-11 NOTE — Telephone Encounter (Signed)
Discussed elevated PSA with Mr Wolford, advised he schedule with Urology to discuss pro/con of biopsy - [facility] [phone]

## 2012-12-15 ENCOUNTER — Ambulatory Visit: Payer: PPO | Attending: Urology | Admitting: Urology

## 2012-12-15 DIAGNOSIS — R972 Elevated prostate specific antigen [PSA]: Secondary | ICD-10-CM | POA: Insufficient documentation

## 2012-12-21 ENCOUNTER — Encounter (INDEPENDENT_AMBULATORY_CARE_PROVIDER_SITE_OTHER): Payer: Self-pay | Admitting: Internal Medicine

## 2012-12-21 ENCOUNTER — Telehealth (INDEPENDENT_AMBULATORY_CARE_PROVIDER_SITE_OTHER): Payer: Self-pay

## 2012-12-21 NOTE — Telephone Encounter (Signed)
Patient was evaluated by urology recently and will be scheduling prostate biopsy - he had been noted to have elevated PSA when seen in clinic recently.    FYI    RN will touch base with him next month re diabetes.

## 2013-01-15 ENCOUNTER — Other Ambulatory Visit (HOSPITAL_BASED_OUTPATIENT_CLINIC_OR_DEPARTMENT_OTHER): Payer: PPO | Admitting: Urology

## 2013-01-18 ENCOUNTER — Telehealth (INDEPENDENT_AMBULATORY_CARE_PROVIDER_SITE_OTHER): Payer: Self-pay | Admitting: Internal Medicine

## 2013-01-18 NOTE — Telephone Encounter (Signed)
Spoke with patient this morning and got him scheduled with PCP for Wednesday 01/24/13 @ 3:30pm. Thank you.

## 2013-01-18 NOTE — Telephone Encounter (Signed)
PSRs:  Please contact patient and offer DM follow up appointment with PCP.

## 2013-01-24 ENCOUNTER — Ambulatory Visit (INDEPENDENT_AMBULATORY_CARE_PROVIDER_SITE_OTHER): Payer: Self-pay | Admitting: Internal Medicine

## 2013-02-12 ENCOUNTER — Ambulatory Visit: Payer: PPO | Attending: Urology | Admitting: Urology

## 2013-02-12 ENCOUNTER — Other Ambulatory Visit (INDEPENDENT_AMBULATORY_CARE_PROVIDER_SITE_OTHER): Payer: Self-pay | Admitting: Urology

## 2013-02-12 DIAGNOSIS — C61 Malignant neoplasm of prostate: Secondary | ICD-10-CM | POA: Insufficient documentation

## 2013-02-12 DIAGNOSIS — R972 Elevated prostate specific antigen [PSA]: Secondary | ICD-10-CM | POA: Insufficient documentation

## 2013-02-13 ENCOUNTER — Telehealth (INDEPENDENT_AMBULATORY_CARE_PROVIDER_SITE_OTHER): Payer: Self-pay | Admitting: Internal Medicine

## 2013-02-13 NOTE — Telephone Encounter (Signed)
Main reason for the call: Spoke with patient regarding his follow up for DM. Patient requested a Monday appointment with provider. On the schedule to see Dr. Carmina Miller on 03/24 @12pm . Thanks.    Call back expected: NO    Daytime call back number: [phone]    Okay to leave detailed VM or msg with someone at any of these numbers (if you are not available when the clinic staff calls back)?: NO

## 2013-02-13 NOTE — Telephone Encounter (Signed)
Patient due for DM follow up and updated labs.   Please contact and schedule.

## 2013-02-26 ENCOUNTER — Ambulatory Visit: Payer: PPO | Attending: Urology | Admitting: Urology

## 2013-02-26 DIAGNOSIS — C61 Malignant neoplasm of prostate: Secondary | ICD-10-CM | POA: Insufficient documentation

## 2013-03-05 ENCOUNTER — Encounter (INDEPENDENT_AMBULATORY_CARE_PROVIDER_SITE_OTHER): Payer: Self-pay | Admitting: Internal Medicine

## 2013-03-08 NOTE — Progress Notes (Signed)
This encounter was opened in error.

## 2013-03-12 ENCOUNTER — Other Ambulatory Visit (HOSPITAL_BASED_OUTPATIENT_CLINIC_OR_DEPARTMENT_OTHER): Payer: Self-pay | Admitting: Urology

## 2013-03-19 ENCOUNTER — Ambulatory Visit: Payer: PPO | Attending: Radiation Oncology | Admitting: Radiation Oncology

## 2013-03-19 DIAGNOSIS — C61 Malignant neoplasm of prostate: Secondary | ICD-10-CM | POA: Insufficient documentation

## 2013-03-28 ENCOUNTER — Ambulatory Visit (INDEPENDENT_AMBULATORY_CARE_PROVIDER_SITE_OTHER): Payer: PPO | Admitting: Internal Medicine

## 2013-04-02 ENCOUNTER — Ambulatory Visit (INDEPENDENT_AMBULATORY_CARE_PROVIDER_SITE_OTHER): Payer: PPO | Admitting: Internal Medicine

## 2013-04-02 ENCOUNTER — Other Ambulatory Visit (INDEPENDENT_AMBULATORY_CARE_PROVIDER_SITE_OTHER): Payer: Self-pay | Admitting: Internal Medicine

## 2013-04-03 ENCOUNTER — Other Ambulatory Visit (INDEPENDENT_AMBULATORY_CARE_PROVIDER_SITE_OTHER): Payer: Self-pay | Admitting: Internal Medicine

## 2013-04-05 MED ORDER — LISINOPRIL 10 MG OR TABS
ORAL_TABLET | ORAL | Status: DC
Start: 2013-04-03 — End: 2013-07-23

## 2013-04-09 ENCOUNTER — Other Ambulatory Visit (HOSPITAL_BASED_OUTPATIENT_CLINIC_OR_DEPARTMENT_OTHER): Payer: Self-pay | Admitting: Urology

## 2013-04-09 ENCOUNTER — Ambulatory Visit: Payer: PPO | Attending: Anesthesiology | Admitting: Urology

## 2013-04-09 ENCOUNTER — Ambulatory Visit (HOSPITAL_BASED_OUTPATIENT_CLINIC_OR_DEPARTMENT_OTHER): Payer: PPO

## 2013-04-09 DIAGNOSIS — N390 Urinary tract infection, site not specified: Secondary | ICD-10-CM | POA: Insufficient documentation

## 2013-04-09 DIAGNOSIS — C61 Malignant neoplasm of prostate: Secondary | ICD-10-CM | POA: Insufficient documentation

## 2013-04-09 DIAGNOSIS — Z01810 Encounter for preprocedural cardiovascular examination: Secondary | ICD-10-CM | POA: Insufficient documentation

## 2013-04-09 LAB — COMPREHENSIVE METABOLIC PANEL
ALT (GPT): 54 U/L — ABNORMAL HIGH (ref 10–48)
AST (GOT): 67 U/L — ABNORMAL HIGH (ref 15–40)
Albumin: 4.9 g/dL (ref 3.5–5.2)
Alkaline Phosphatase (Total): 59 U/L (ref 39–139)
Anion Gap: 12 — ABNORMAL HIGH (ref 3–11)
Bilirubin (Total): 0.7 mg/dL (ref 0.2–1.3)
Calcium: 10.4 mg/dL — ABNORMAL HIGH (ref 8.9–10.2)
Carbon Dioxide, Total: 30 mEq/L (ref 22–32)
Chloride: 95 mEq/L — ABNORMAL LOW (ref 98–108)
Creatinine: 0.7 mg/dL (ref 0.51–1.18)
GFR, Calc, African American: >60 mL/min (ref >59)
GFR, Calc, European American: >60 mL/min (ref >59)
Glucose: 98 mg/dL (ref 62–125)
Potassium: 3.9 mEq/L (ref 3.7–5.2)
Protein (Total): 8.8 g/dL — ABNORMAL HIGH (ref 6.0–8.2)
Sodium: 137 mEq/L (ref 136–145)
Urea Nitrogen: 4 mg/dL — ABNORMAL LOW (ref 8–21)

## 2013-04-09 LAB — CBC (HEMOGRAM)
Hematocrit: 45 % (ref 38–50)
Hemoglobin: 15.2 g/dL (ref 13.0–18.0)
MCH: 30 pg (ref 27.3–33.6)
MCHC: 33.8 g/dL (ref 32.2–36.5)
MCV: 89 fL (ref 81–98)
Platelet Count: 251 10*3/uL (ref 150–400)
RBC: 5.07 mil/uL (ref 4.40–5.60)
RDW-CV: 12.9 % (ref 11.6–14.4)
WBC: 7.79 10*3/uL (ref 4.3–10.0)

## 2013-04-10 LAB — PSBC PREADMIT TYPE AND SCREEN: Indir. Antiglobulin Rslt (Sendout): NEGATIVE

## 2013-04-10 LAB — ABO AND RH CONFIRMATION (SENDOUT)

## 2013-04-11 ENCOUNTER — Ambulatory Visit (INDEPENDENT_AMBULATORY_CARE_PROVIDER_SITE_OTHER): Payer: PPO | Admitting: Internal Medicine

## 2013-04-11 LAB — URINE C/S
Colony Count: <100
Culture: NO GROWTH

## 2013-04-12 ENCOUNTER — Encounter (INDEPENDENT_AMBULATORY_CARE_PROVIDER_SITE_OTHER): Payer: Self-pay | Admitting: Internal Medicine

## 2013-04-16 ENCOUNTER — Telehealth (INDEPENDENT_AMBULATORY_CARE_PROVIDER_SITE_OTHER): Payer: Self-pay | Admitting: Internal Medicine

## 2013-04-16 NOTE — Telephone Encounter (Signed)
Patient is severely overdue for DM follow up appointment.   No showed last appointment on 04/11/13.    Please contact patient and offer soonest appointment with PCP.

## 2013-04-18 NOTE — Telephone Encounter (Signed)
THe patient called to schedule a diabetes follow up and to be seen for "other things".[phone], [phone] cell (detailed messages ok on cell)  Patient has surgery on 05/13 and he wanted to see if he could be fit in before that.

## 2013-04-19 NOTE — Telephone Encounter (Signed)
Called pt and lvm informing that there is no openings with PCP before 04/24/13. CCR please schedule next opening.    PSR's please call and offer next f/u apt on 04/20/13. Thanks

## 2013-04-20 NOTE — Telephone Encounter (Signed)
Spoke with pt. He will call back to make his apt. Closing TE

## 2013-04-24 ENCOUNTER — Inpatient Hospital Stay (HOSPITAL_COMMUNITY): Payer: PPO | Admitting: Urology

## 2013-04-24 ENCOUNTER — Other Ambulatory Visit (INDEPENDENT_AMBULATORY_CARE_PROVIDER_SITE_OTHER): Payer: Self-pay | Admitting: Urology

## 2013-04-24 ENCOUNTER — Ambulatory Visit
Admission: RE | Admit: 2013-04-24 | Discharge: 2013-04-24 | Disposition: A | Discharge status: Home / Self Care | Payer: PPO | Attending: Urology | Admitting: Urology

## 2013-04-25 LAB — GLUCOSE POC, ~~LOC~~: Glucose (POC): 114 mg/dL (ref 62–125)

## 2013-05-08 ENCOUNTER — Other Ambulatory Visit (HOSPITAL_BASED_OUTPATIENT_CLINIC_OR_DEPARTMENT_OTHER): Payer: Self-pay | Admitting: Urology

## 2013-05-08 ENCOUNTER — Other Ambulatory Visit (HOSPITAL_COMMUNITY): Payer: Self-pay | Admitting: Urology

## 2013-05-08 ENCOUNTER — Other Ambulatory Visit (HOSPITAL_COMMUNITY): Payer: Self-pay

## 2013-05-08 ENCOUNTER — Inpatient Hospital Stay (HOSPITAL_COMMUNITY): Payer: PPO | Admitting: Urology

## 2013-05-08 ENCOUNTER — Inpatient Hospital Stay
Admission: RE | Admit: 2013-05-08 | Discharge: 2013-05-09 | DRG: 708 | Disposition: A | Discharge status: Home / Self Care | Payer: PPO | Attending: Urology | Admitting: Urology

## 2013-05-08 DIAGNOSIS — G8929 Other chronic pain: Secondary | ICD-10-CM | POA: Diagnosis present

## 2013-05-08 DIAGNOSIS — M545 Low back pain, unspecified: Secondary | ICD-10-CM | POA: Diagnosis present

## 2013-05-08 DIAGNOSIS — C61 Malignant neoplasm of prostate: Principal | ICD-10-CM | POA: Diagnosis present

## 2013-05-08 DIAGNOSIS — E669 Obesity, unspecified: Secondary | ICD-10-CM | POA: Diagnosis present

## 2013-05-08 DIAGNOSIS — E119 Type 2 diabetes mellitus without complications: Secondary | ICD-10-CM | POA: Diagnosis present

## 2013-05-08 DIAGNOSIS — Z6830 Body mass index (BMI) 30.0-30.9, adult: Secondary | ICD-10-CM

## 2013-05-08 DIAGNOSIS — F3289 Other specified depressive episodes: Secondary | ICD-10-CM | POA: Diagnosis present

## 2013-05-08 DIAGNOSIS — I10 Essential (primary) hypertension: Secondary | ICD-10-CM | POA: Diagnosis present

## 2013-05-08 DIAGNOSIS — N401 Enlarged prostate with lower urinary tract symptoms: Secondary | ICD-10-CM | POA: Diagnosis present

## 2013-05-08 DIAGNOSIS — R350 Frequency of micturition: Secondary | ICD-10-CM | POA: Diagnosis present

## 2013-05-08 LAB — IMMEDIATE CARE HGB AND HCT
Hematocrit: 41 % (ref 38–50)
Hemoglobin: 13.5 g/dL (ref 13.0–18.0)

## 2013-05-08 LAB — GLUCOSE POC, ~~LOC~~
Glucose (POC): 112 mg/dL (ref 62–125)
Glucose (POC): 150 mg/dL — ABNORMAL HIGH (ref 62–125)
Glucose (POC): 136 mg/dL — ABNORMAL HIGH (ref 62–125)
Glucose (POC): 120 mg/dL (ref 62–125)
Glucose (POC): 155 mg/dL — ABNORMAL HIGH (ref 62–125)
Glucose (POC): 126 mg/dL — ABNORMAL HIGH (ref 62–125)
Glucose (POC): 110 mg/dL (ref 62–125)

## 2013-05-08 LAB — HEMATOCRIT

## 2013-05-08 LAB — GLUCOSE, WHOLE BLOOD: Glucose: 114 mg/dL (ref 62–125)

## 2013-05-09 ENCOUNTER — Other Ambulatory Visit (HOSPITAL_COMMUNITY): Payer: Self-pay | Admitting: Urology

## 2013-05-09 ENCOUNTER — Telehealth (INDEPENDENT_AMBULATORY_CARE_PROVIDER_SITE_OTHER): Payer: Self-pay

## 2013-05-09 ENCOUNTER — Other Ambulatory Visit (HOSPITAL_COMMUNITY): Payer: Self-pay

## 2013-05-09 ENCOUNTER — Other Ambulatory Visit: Payer: Self-pay | Admitting: Pharmacist

## 2013-05-09 LAB — GLUCOSE POC, ~~LOC~~
Glucose (POC): 103 mg/dL (ref 62–125)
Glucose (POC): 109 mg/dL (ref 62–125)
Glucose (POC): 107 mg/dL (ref 62–125)

## 2013-05-09 LAB — TYPE AND SCREEN (SENDOUT): Indir. Antiglobulin Rslt (Sendout): NEGATIVE

## 2013-05-09 LAB — HEMOGLOBIN A1C, HPLC: Hemoglobin A1C: 6.1 % — ABNORMAL HIGH (ref 4.0–6.0)

## 2013-05-09 LAB — HEMATOCRIT: Hematocrit: 35 % — ABNORMAL LOW (ref 38–50)

## 2013-05-09 NOTE — Telephone Encounter (Signed)
Patient is currently inpatient, will defer until discharge.  Informed pharmacy via fax.

## 2013-05-09 NOTE — Telephone Encounter (Signed)
Inpatient for surgical management of prostate CA

## 2013-05-10 NOTE — Telephone Encounter (Signed)
Herron underwent radical prostatectomy recently.  He was instructed to follow up with urology per DC notes.    FYI

## 2013-05-12 ENCOUNTER — Telehealth (INDEPENDENT_AMBULATORY_CARE_PROVIDER_SITE_OTHER): Payer: Self-pay | Admitting: Internal Medicine

## 2013-05-12 NOTE — Telephone Encounter (Signed)
Main reason for the call: Patient is calling to fill his metformin and called [facility] who states they don't have the rx #. Out of metformin and needs his [facility]. Please advise.     Call back expected: YES    Daytime call back number: [phone]    Okay to leave detailed VM or msg with someone at any of these numbers (if you are not available when the clinic staff calls back)?: YES

## 2013-05-13 ENCOUNTER — Emergency Department: Payer: Self-pay | Admitting: Emergency Medicine

## 2013-05-13 LAB — URINALYSIS, COMPLETE
Glucose,UR: >=500 mg/dL (ref 0–75)
Leukocyte Esterase: NEGATIVE
Ph: 5 (ref 4.5–8.0)
Protein: >=500
RBC,UR: 327 /HPF (ref 0–5)
Specific Gravity: 1.012 (ref 1.003–1.030)
Squamous Epithelial: <1
WBC UR: 3 /HPF (ref 0–5)

## 2013-05-13 LAB — CBC
HGB: 13.5 g/dL (ref 13.0–18.0)
MCH: 28.9 pg (ref 26.0–34.0)
MCHC: 34.1 g/dL (ref 32.0–36.0)
MCV: 85 fL (ref 80–100)
Platelet: 266 10*3/uL (ref 150–440)
WBC: 18.8 10*3/uL — ABNORMAL HIGH (ref 3.8–10.6)

## 2013-05-13 LAB — COMPREHENSIVE METABOLIC PANEL
Albumin: 3.8 g/dL (ref 3.4–5.0)
BUN: 41 mg/dL — ABNORMAL HIGH (ref 7–18)
Bilirubin,Total: 0.6 mg/dL (ref 0.2–1.0)
Calcium, Total: 9.7 mg/dL (ref 8.5–10.1)
Co2: 25 mmol/L (ref 21–32)
EGFR (African American): 57 — ABNORMAL LOW
EGFR (Non-African Amer.): 49 — ABNORMAL LOW
Glucose: 344 mg/dL — ABNORMAL HIGH (ref 65–99)
Osmolality: 292 (ref 275–301)
Potassium: 5.6 mmol/L — ABNORMAL HIGH (ref 3.5–5.1)
SGOT(AST): 27 U/L (ref 15–37)
Sodium: 134 mmol/L — ABNORMAL LOW (ref 136–145)
Total Protein: 7.5 g/dL (ref 6.4–8.2)

## 2013-05-13 LAB — BASIC METABOLIC PANEL
Anion Gap: 6 — ABNORMAL LOW (ref 7–16)
Calcium, Total: 8.4 mg/dL — ABNORMAL LOW (ref 8.5–10.1)
Chloride: 106 mmol/L (ref 98–107)
Co2: 23 mmol/L (ref 21–32)
Creatinine: 1.39 mg/dL — ABNORMAL HIGH (ref 0.60–1.30)
EGFR (African American): >60
EGFR (Non-African Amer.): 58 — ABNORMAL LOW
Osmolality: 293 (ref 275–301)
Potassium: 5.6 mmol/L — ABNORMAL HIGH (ref 3.5–5.1)

## 2013-05-13 LAB — PROTIME-INR
INR: 3.3
Prothrombin Time: 32.2 secs — ABNORMAL HIGH (ref 11.5–14.7)

## 2013-05-13 LAB — LIPASE, BLOOD: Lipase: 181 U/L (ref 73–393)

## 2013-05-13 LAB — TROPONIN I: Troponin-I: 0.02 ng/mL

## 2013-05-14 MED ORDER — METFORMIN HCL 1000 MG OR TABS
ORAL_TABLET | ORAL | Status: DC
Start: 2013-05-14 — End: 2013-06-09

## 2013-05-14 NOTE — Telephone Encounter (Signed)
Please print his [facility] discharge summary and med list for me. Thank you

## 2013-05-14 NOTE — Telephone Encounter (Signed)
[phone] - Patient calling to check status of Metformin request. States he is not feeling well with out the medication. Would like resolution today. Please contact patient as soon as possible for refill status. Thank you.

## 2013-05-14 NOTE — Telephone Encounter (Signed)
D/c summary does confirm pt discharged on metformin 1000 mg/day.  Ordered X 30 days.  Pt has appt with Dr. Carmina Miller in 2 weeks.    Please let pt know that rx has been sent to pharmacy. Thank you

## 2013-05-14 NOTE — Telephone Encounter (Signed)
LOV: 12/08/12  Rx last filled: Historical?      Per chart review recent discharge 5/28 from [facility]  Future app with PCP 05/25/13    Routing to covering provider for advise.    Thank you

## 2013-05-16 ENCOUNTER — Encounter (INDEPENDENT_AMBULATORY_CARE_PROVIDER_SITE_OTHER): Payer: Self-pay | Admitting: Urology

## 2013-05-16 ENCOUNTER — Ambulatory Visit: Payer: PPO | Admitting: Urology

## 2013-05-16 VITALS — BP 153/88 | HR 100 | Resp 16

## 2013-05-16 MED ORDER — OXYCODONE HCL 5 MG OR TABS
ORAL_TABLET | ORAL | Status: DC
Start: 2013-05-16 — End: 2013-05-23

## 2013-05-16 MED ORDER — TADALAFIL 5 MG OR TABS
ORAL_TABLET | ORAL | Status: DC
Start: 2013-05-16 — End: 2013-10-08

## 2013-05-16 NOTE — Progress Notes (Signed)
Urology Follow-up       SUBJECTIVE:    Chief Complaint   Patient presents with   . Post-Op     removal of the catheter       History of Present Illness:  Roger [name] is a 52 year old male who returns  to me for prostate cancer.  This is a 52 year old male, who was diagnosed with clinical T1c, Gleason 3 + 4 in 5/12 cores prostate cancer.  He had a PSA of 12.  Of note, preop IPSS score of 14.  Has been poorly compliant at times with HTN meds. Preop able to get erections with Viagra..  Underwent robotic prostatectomy last week and went home postop day one.   Here for catheter removal, path discussion and postop check.  Walking around and stairs, worked in the yard.  Occasional pain meds.    Review of Systems Detailed exam   Const: Neg for fever, chills    Past Medical Hx:    Past Medical History   Diagnosis Date   . Unspecified essential hypertension 1996     on 3 drugs, some side effects from medications.    . Family history of colon cancer      brother died in 45s of colon cancer    . Type II or unspecified type diabetes mellitus without mention of complication, not stated as uncontrolled 2013       Past Surgical Hx:    Past Surgical History   Procedure Laterality Date   . No prior surgeries     . Colonoscopy stoma dx w/wo collj spec spx  2003     [facility], normal   . Colonoscopy stoma dx w/wo collj spec spx  2011       Past Family and Social Hx:  Mr. @LNAME  , family history includes Cancer in his brother and Hypertension in his mother., He,  reports that he has never smoked. He does not have any smokeless tobacco history on file. He reports that he drinks about 6.0 ounces of alcohol per week. He reports that he does not use illicit drugs..    Active Meds:    Outpatient Prescriptions Marked as Taking for the 05/16/13 encounter (Office Visit) with Cathlean Marseilles   Medication Sig Dispense Refill   . AmLODIPine Besylate 10 MG OR TABS Take 1 tablet by mouth daily  30 Tab  3   . B-D ULTRAFINE III SHORT PEN 31G X 8  MM Does not apply Misc for insulin 3 needles a day       . Blood Glucose Monitoring Suppl (BLOOD GLUCOSE MONITOR KIT) Does not apply Kit one meter for testing 1-3 times per day  1 Kit  11   . Glucose Blood (BLOOD GLUCOSE TEST STRIPS) In Vitro Strip test up to 3 times per day  100 Strip  11   . Lancets Thin Does not apply Misc for testing up to 3 times per day  200 Package  11   . Lisinopril 10 MG Oral Tab TAKE ONE TABLET BY MOUTH EVERY DAY  90 Tab  2   . MetFORMIN HCl 1000 MG Oral Tab one daily for blood sugar  30 Tab  0   . MetFORMIN HCl 500 MG Oral Tab Take two tablets by mouth daily.       . Multiple Vitamins-Minerals (MULTIVITAMIN & MINERAL OR) 1 tab a day       . Omeprazole 20 MG Oral CAPSULE DELAYED RELEASE take 1  po q day  90 Cap  3   . OxyCODONE HCl 5 MG Oral Tab None Entered         No Facility-Administered Medications for the 05/16/13 encounter (Office Visit) with Cathlean Marseilles.       Allergies:    Allergies as of 05/16/2013 Loraine Leriche as Reviewed 05/16/2013   Allergen Reaction Noted   . Atenolol  12/19/2001       OBJECTIVE:  Physical Exam:  BP 153/88  Pulse 100  Resp 16  SpO2 96%  Constitutional: WDWN, Pleasant and appropriate affect and No acute distress  CV:  No peripheral swelling noted  Abdomen:  Incision sites healing well  GU Exam:  Catheter removed without problem      Total time of visit was approximately 25 minutes of which > 50% was spent in counseling and or coordination of care        IMPRESSION AND PLAN     1. pT2N0 Gleason 3 + 4 prostate cancer s/p prostatectomy.  Margins negative.  Catheter removed.  Taking cipro.    PSA in 3 months.  Discussed penile rehabilitation.  Rx given for daily Cialis.  Rx given for oxycodone.  Kegel's and void frequently while healing.  2. Diabetes.  3. Hypertension.  4. Obesity.  BMI 30.

## 2013-05-18 ENCOUNTER — Telehealth (INDEPENDENT_AMBULATORY_CARE_PROVIDER_SITE_OTHER): Payer: Self-pay | Admitting: Urology

## 2013-05-18 NOTE — Telephone Encounter (Signed)
CONFIRMED PHONE NUMBER: [phone]  CALLERS FIRST AND LAST NAME: Clabe Seal    FACILITY NAME: NA TITLE: AN  CALLERS RELATIONSHIP:Self  RETURN CALL: Detailed message on voicemail only     SUBJECT: Letter/Form Request   REASON FOR REQUEST: Patient calling on the status of the FMLA paper work that was left and to be faxed to his employer on Wednesday please call and advise.     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

## 2013-05-18 NOTE — Telephone Encounter (Signed)
Received FMLA paperwork from RN Birgit to confirm as to whom the paperwork needs to be sent to & also the fax number.  Called pt to clarify that the information we have is indeed correct, and pt did confirm that the paperwork should be faxed to the [place] [facility] Attn: Marisue Humble Office number: [phone] Fax number: [phone].    Done & faxed, confirmation attached to paperwork & sent to be scanned into pt's chart.

## 2013-05-22 ENCOUNTER — Telehealth (INDEPENDENT_AMBULATORY_CARE_PROVIDER_SITE_OTHER): Payer: Self-pay | Admitting: Internal Medicine

## 2013-05-22 NOTE — Telephone Encounter (Signed)
CONFIRMED PHONE NUMBER: [phone]  CALLERS FIRST AND LAST NAME: Clabe Seal    FACILITY NAME: na TITLE: na  CALLERS RELATIONSHIP:Self  RETURN CALL: Detailed message on voicemail only     SUBJECT: Prescription Management   REASON FOR REQUEST: Medication questions and inquiry    MEDICATION: n/a  CONCERN/QUESTION: Patient states that he is having extensive amounts of pain in feet and a little on fingers. Patient is requesting a medication that can help alleviate the pain. [facility] was able to schedule an appointment for this patient tomorrow 05/23/2013 at 1100 but requested to send this message as urgent. Patient is requesting a call back today so please contact patient back as soon as possible, thank you.  PRESCRIBING PROVIDER: Dr Carmina Miller, Nicola Girt  DOSE TAKING NOW: n/a  PHARMACY NAME, LOCATION, & PHONE #: n/a

## 2013-05-22 NOTE — Telephone Encounter (Signed)
Talked with pt he states he has appt with Dr Carmina Miller tomorrow and has pain medication if he needs it,

## 2013-05-23 ENCOUNTER — Ambulatory Visit (INDEPENDENT_AMBULATORY_CARE_PROVIDER_SITE_OTHER): Payer: PPO | Admitting: Internal Medicine

## 2013-05-23 ENCOUNTER — Encounter (INDEPENDENT_AMBULATORY_CARE_PROVIDER_SITE_OTHER): Payer: Self-pay | Admitting: Internal Medicine

## 2013-05-23 VITALS — BP 151/91 | HR 122 | Temp 98.8°F | Wt 203.0 lb

## 2013-05-23 LAB — CBC, DIFF
% Basophils: 0 %
% Eosinophils: 1 %
% Immature Granulocytes: 0 %
% Lymphocytes: 33 %
% Monocytes: 17 %
% Neutrophils: 49 %
Absolute Eosinophil Count: 0.1 10*3/uL (ref 0.00–0.50)
Absolute Lymphocyte Count: 2.27 10*3/uL (ref 1.00–4.80)
Basophils: 0.03 10*3/uL (ref 0.00–0.20)
Hematocrit: 38 % (ref 38–50)
Hemoglobin: 12.6 g/dL — ABNORMAL LOW (ref 13.0–18.0)
Immature Granulocytes: 0.03 10*3/uL (ref 0.00–0.05)
MCH: 29.3 pg (ref 27.3–33.6)
MCHC: 33.2 g/dL (ref 32.2–36.5)
MCV: 88 fL (ref 81–98)
Monocytes: 1.15 10*3/uL — ABNORMAL HIGH (ref 0.00–0.80)
Neutrophils: 3.39 10*3/uL (ref 1.80–7.00)
Platelet Count: 534 10*3/uL — ABNORMAL HIGH (ref 150–400)
RBC: 4.3 mil/uL — ABNORMAL LOW (ref 4.40–5.60)
RDW-CV: 13.2 % (ref 11.6–14.4)
WBC: 6.97 10*3/uL (ref 4.3–10.0)

## 2013-05-23 MED ORDER — OXYCODONE HCL 5 MG OR TABS
ORAL_TABLET | ORAL | Status: DC
Start: 2013-05-23 — End: 2013-06-01

## 2013-05-23 NOTE — Progress Notes (Signed)
Pt here for f/u on diabetes. Pt c/o of pain in tips of fingers and toes x 1 week.   Pt had prostates surgery about 3 weeks ago at [facility]; still c/o abdominal pain when bending over.    Colonoscopy: up to date  PPSV: NA    EYE EXAM: done 09/2012 at [facility].

## 2013-05-23 NOTE — Progress Notes (Signed)
CHIEF COMPLAINT:  Roger [name] is a 52 year old male here to discuss Diabetes and Neuropathic Pain  .    HISTORY OF PRESENT ILLNESS OR INTERVAL HISTORY:  Pt has been treated for prostate cancer, going well with Dr. Clearance Coots.    He did start metformin and A1C has improved.  He continues to drink, less than 1/5 per day.    He continues to have pain when he bends forward.  He had surgery on 05/08/13.    He was given oxycodone, taking 2 every 5-6 hours.    He is also taking ibuprofen 400mg  every 4-6 hours.    Metformin will cause nausea if he takes it with other meds.    He is having pain/tingling/burning on the bottom of the feet (balls of feet and toes) as well as some fingers for the last week.  Microfilament test negative today.      Review of Systems   Constitutional: Negative.    Cardiovascular: Negative.    Psychiatric/Behavioral: The patient is nervous/anxious.          PROBLEM LIST:  Reviewed and updated in the electronic medical record under Problem List.    This includes pertinent past medical and surgical history.    Patient Active Problem List    Diagnosis Date Noted   . Prostate cancer [185] 05/16/2013   . Anxiety [300.00] 04/29/2012   . 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 FAMILY HX GI MALIGNANCY [V16.0] 05/01/2002   . HYPERCALCEMIA [275.42] 06/11/2001   . ELEV TRANSAMINASE/LDH [790.4] 06/11/2001   . HYPERTENSION NOS [401.9] 06/02/2001         MEDICATIONS:  Reviewed and confirmed in the electronic medical record     ALLERGIES:  Reviewed and confirmed in the electronic medical record     SOCIAL HISTORY:  Reviewed and confirmed in the electronic medical record    FAMILY HISTORY:  Reviewed and confirmed in the electronic medical record        Physical Exam   Constitutional: He is oriented to person, place, and time. He appears well-developed and well-nourished. No distress.   Cardiovascular: Normal rate, regular rhythm and normal heart sounds.  Exam reveals no  gallop and no friction rub.    No murmur heard.  Pulmonary/Chest: Effort normal and breath sounds normal. No respiratory distress. He has no wheezes. He has no rales.   Abdominal: Soft. Bowel sounds are normal.   Neurological: He is alert and oriented to person, place, and time.   No lesions on feet, normal strength   Skin: He is not diaphoretic.   Psychiatric: He has a normal mood and affect. His behavior is normal. Judgment and thought content normal.       ASSESSMENT AND PLAN:  (185) Prostate cancer  (primary encounter diagnosis)  Refilled oxycodone at 60 tabs, advised to slow down on the meds and not to combine with alcohol.    I anticipate he will not need a refill, but has used 30 in the last week.      (355.9) Neuropathy  Plan: VITAMIN B1 (THIAMINE), FOLATE & VITAMIN B12        Possibly related to diabetes, but less likely given recent excellent control.  Alcohol use may be more likely and I have ordered basic labs to start, but advised he quit drinking, continue take prenatal vitamins.    Will give it time after he stops drinking to resolve  Will try gabapentin if not improving.      (401.9) HYPERTENSION NOS  Plan: f/u next visit visit, increase lisinopril if needed.      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

## 2013-05-24 LAB — COMPREHENSIVE METABOLIC PANEL
ALT (GPT): 53 U/L — ABNORMAL HIGH (ref 10–48)
AST (GOT): 133 U/L — ABNORMAL HIGH (ref 15–40)
Albumin: 4.2 g/dL (ref 3.5–5.2)
Alkaline Phosphatase (Total): 108 U/L (ref 39–139)
Anion Gap: 11 (ref 3–11)
Bilirubin (Total): 0.5 mg/dL (ref 0.2–1.3)
Calcium: 10 mg/dL (ref 8.9–10.2)
Carbon Dioxide, Total: 26 mEq/L (ref 22–32)
Chloride: 99 mEq/L (ref 98–108)
Creatinine: 0.93 mg/dL (ref 0.51–1.18)
GFR, Calc, African American: >60 mL/min (ref >59)
GFR, Calc, European American: >60 mL/min (ref >59)
Glucose: 113 mg/dL (ref 62–125)
Potassium: 4.8 mEq/L (ref 3.7–5.2)
Potassium: ELEVATED
Protein (Total): 7.8 g/dL (ref 6.0–8.2)
Sodium: 136 mEq/L (ref 136–145)
Urea Nitrogen: 3 mg/dL — ABNORMAL LOW (ref 8–21)

## 2013-05-24 LAB — LIPID PANEL
Cholesterol (LDL): 105 mg/dL (ref <130)
Cholesterol/HDL Ratio: 3.5
HDL Cholesterol: 50 mg/dL (ref >40)
Non-HDL Cholesterol: 125 mg/dL (ref 0–159)
Total Cholesterol: 175 mg/dL (ref <200)
Triglyceride: 101 mg/dL (ref <150)

## 2013-05-24 LAB — FOLATE & VITAMIN B12
Folate, SRM: >22 ng/mL (ref >5.8)
Vitamin B12 (Cobalamin): 832 pg/mL (ref 180–914)
Vitamin B12 (Cobalamin): NORMAL

## 2013-05-25 ENCOUNTER — Ambulatory Visit (INDEPENDENT_AMBULATORY_CARE_PROVIDER_SITE_OTHER): Payer: PPO | Admitting: Internal Medicine

## 2013-05-29 LAB — VITAMIN B1 (THIAMINE): Vitamin B1 (Thiamine): 1 ACTIVITY COEFF (ref 1.00–1.30)

## 2013-05-30 ENCOUNTER — Telehealth (INDEPENDENT_AMBULATORY_CARE_PROVIDER_SITE_OTHER): Payer: Self-pay | Admitting: Urology

## 2013-05-30 NOTE — Telephone Encounter (Signed)
Situation:  Roger [name] is calling call is transfered from the contact center  Background: patient is S/P RALP w/ PLND  On 05/08/13.  Patient states he is doing well, he does c/o of cramping pain to the right quadrant, he states this only bothers him when he is bending over to tie shoes or exertion. Not felt to the left side  Assessment: patient denies any other symptoms.  He states the incisions are all healing well   Request:  I advised him to monitor his discomfort,  Roouting to Dr. Gerlene Burdock    Vanessa Ralphs RN2     [facility]  [facility]  [address]  [place], [place] [address]

## 2013-06-01 ENCOUNTER — Other Ambulatory Visit (INDEPENDENT_AMBULATORY_CARE_PROVIDER_SITE_OTHER): Payer: Self-pay | Admitting: Internal Medicine

## 2013-06-01 NOTE — Telephone Encounter (Signed)
Your patient has asked for: Oxycodone 5mg     Directions: 1-2 tab po TID    Quantity: 60    Last filled: 05/23/13 (pt given hardcopy?)    Routing to provider as refill center can't authorize CIIs.

## 2013-06-04 MED ORDER — OXYCODONE HCL 5 MG OR TABS
ORAL_TABLET | ORAL | Status: DC
Start: 2013-06-01 — End: 2013-07-23

## 2013-06-04 NOTE — Telephone Encounter (Signed)
Per my last note on 05/23/2013:  185) Prostate cancer (primary encounter diagnosis)   Refilled oxycodone at 60 tabs, advised to slow down on the meds and not to combine with alcohol.   I anticipate he will not need a refill, but has used 30 in the last week.     Per my calculation, pt has been taking 5 per day if he has ran out of meds.    I can refill meds, but will do a quick taper off.  4 per day x 5 days,   3 per day x 5 days,   2 per day x 10 days  1 per day x 10 days, then stop.    There will be no further refills at this point.

## 2013-06-04 NOTE — Telephone Encounter (Signed)
Dr. Deirdre Pippins note printed and attached to Oxycodone Rx for pt to pick up; placed in pt pick-up drawer. Pt notified that Rx and taper instructions are ready.

## 2013-06-04 NOTE — Telephone Encounter (Signed)
Medication Refill Documentation    Name of Medication: Oxycodone    Prescribing provider:  Catha Gosselin, MD    Protocol used (by medication type, not necessarily patient diagnosis): Adult:  CONTROLLED SUBSTANCES - Do not refill:  Send to provider as Rx Auth.    Date last filled: 05/23/13    Date last seen for this issue:  05/23/13      BP Readings from Last 2 Encounters:   05/23/13 151/91   05/16/13 153/88       Next scheduled appointment date:  Visit date not found

## 2013-06-06 ENCOUNTER — Telehealth (INDEPENDENT_AMBULATORY_CARE_PROVIDER_SITE_OTHER): Payer: Self-pay | Admitting: Internal Medicine

## 2013-06-06 NOTE — Telephone Encounter (Signed)
Patient called to give fax number for his employer.  The fax number is [phone], Attention: Madaline Guthrie.  Please have the earliest return to work date as 06/07/13.  Thank you.

## 2013-06-06 NOTE — Telephone Encounter (Signed)
Sorry, I made a mistake on return to work date a minute ago.  The earliest return date should be 06/10/13, not 06/07/13.  Originally, patient asked for the date to be 06/09/13.  Thank you.

## 2013-06-06 NOTE — Telephone Encounter (Signed)
CONFIRMED PHONE NUMBER: [phone]  CALLERS FIRST AND LAST NAME: Clabe Seal  FACILITY NAME: na TITLE: na  CALLERS RELATIONSHIP:Self  RETURN CALL: Detailed message on voicemail only     SUBJECT: Letter/Form Request   REASON FOR REQUEST: patient had surgery about a month ago for a prostate removal; approved to come back on 06/08/13 for work but needs to get an extra day due to appointment conflict; patient will call back to provide employer's fax number but for the mean time, letter should be emailed. Thank you!    WHAT IS THE LETTER/FORM FOR: Return to work; date back 06/09/13  WHO SHOULD FILL IT OUT: azen, jennifer  WHERE SHOULD IT BE SENT: [email or web address], or [email or web address]  DATE NEEDED BY: ASAP

## 2013-06-07 NOTE — Telephone Encounter (Signed)
CONFIRMED PHONE NUMBER: [phone]  CALLERS FIRST AND LAST NAME: Clabe Seal  FACILITY NAME: n/a TITLE: n/a  CALLERS RELATIONSHIP:Self  RETURN CALL: Detailed message on voicemail only     SUBJECT: General Message   REASON FOR REQUEST: please 'DON'T SEND THE THE RELEASE TO WORK LETTER YET".    MESSAGE: Patient states he will be taking to he surgeon because he is not doing well and may have to wait longer before he goes back to work.  Patient will be by today to pick up script and will be able to tell provider the dates he will be able to return to work.

## 2013-06-07 NOTE — Telephone Encounter (Signed)
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      Please see other TE dated 06/06/13.  Pt stated he now needs letter.

## 2013-06-07 NOTE — Telephone Encounter (Signed)
Situation:  Roger [name] is calling returning call to patient  Background: patient is S/P RALP 05/08/13 continues to c/o right side discomfort and swelling   Assessment: Patient states he is scheduled back to work 06/11/13  Request:  patient is requesting additional release from work until 06/18/13, letter is faxed to [facility] for approval from Dr. Clearance Coots, fax confirmation received    Vanessa Ralphs RN2     [facility] Medicine  [facility]  [address]  [place], [place] [address]

## 2013-06-07 NOTE — Telephone Encounter (Signed)
Patient called to check the status of this.

## 2013-06-08 ENCOUNTER — Encounter (INDEPENDENT_AMBULATORY_CARE_PROVIDER_SITE_OTHER): Payer: PPO | Admitting: Internal Medicine

## 2013-06-08 NOTE — Telephone Encounter (Signed)
Patient called regarding status of return to work letter.  Please call.  Patient is concerned because issue should have been resolved by this time.    Please expedite.    Thanks

## 2013-06-08 NOTE — Telephone Encounter (Signed)
This has been completed    Kimberly Sobolik RN2     [place]  [facility]  [facility]  [address] [place] [address]

## 2013-06-08 NOTE — Telephone Encounter (Signed)
Patient called back to check the status of his return to work status and the letter he needs. He wanted to discuss it further with the nurse to know what his limitations are. Please call to advise, okay to leave a detailed message.

## 2013-06-08 NOTE — Telephone Encounter (Addendum)
Situation:  Roger [name] is calling returning call to patient  Background: patient is 1 month S/P RALP who called 06/07/13 requesting extension from work for 1 week returning 06/18/13  Assessment: letter was signed by Dr. Clearance Coots and faxed to number provided, fax confirmation received, patient is calling today unsure as to return to work 06/11/13.  I spent 20 minutes on the phone with patient wanting to talk about his job and losing benefits for the additional week he requested off, I advised him if he was having complications, pain and weakness, on his right side he reported both yesterday and 05/30/13 and given the strenuous job duties he could take additional week off.  Explanation does not seem to be agreeable to patient.  I advised him to think about his desire and capability to return to work next week and let [facility] or Dr. Clearance Coots know if we can further assist him with any job related communications  Request:  Patient was agreeable to this and confirms plan.    Vanessa Ralphs RN2     [facility] Medicine  [facility]  [address]  [place], [place] [address]  Routing to Dr. Clearance Coots , Dr. Delford Field

## 2013-06-09 ENCOUNTER — Other Ambulatory Visit (INDEPENDENT_AMBULATORY_CARE_PROVIDER_SITE_OTHER): Payer: Self-pay | Admitting: Internal Medicine

## 2013-06-11 ENCOUNTER — Encounter (INDEPENDENT_AMBULATORY_CARE_PROVIDER_SITE_OTHER): Payer: PPO | Admitting: Internal Medicine

## 2013-06-11 MED ORDER — METFORMIN HCL 1000 MG OR TABS
ORAL_TABLET | ORAL | Status: DC
Start: 2013-06-09 — End: 2013-10-10

## 2013-06-11 NOTE — Telephone Encounter (Signed)
CONFIRMED PHONE NUMBER: [phone]  CALLERS FIRST AND LAST NAME: Clabe Seal    FACILITY NAME: na TITLE: na  CALLERS RELATIONSHIP:Self  RETURN CALL: Detailed message on voicemail only     SUBJECT: General Message   REASON FOR REQUEST: Metformin    MESSAGE: Patient would like to know if Dr. Carmina Miller can approve refill request ASAP. Patient is down to one tablet. Please advise.

## 2013-06-22 LAB — PATHOLOGY, SURGICAL

## 2013-07-13 ENCOUNTER — Encounter (INDEPENDENT_AMBULATORY_CARE_PROVIDER_SITE_OTHER): Payer: PPO | Admitting: Internal Medicine

## 2013-07-18 ENCOUNTER — Encounter (INDEPENDENT_AMBULATORY_CARE_PROVIDER_SITE_OTHER): Payer: PPO | Admitting: Urology

## 2013-07-23 ENCOUNTER — Ambulatory Visit (INDEPENDENT_AMBULATORY_CARE_PROVIDER_SITE_OTHER): Payer: PPO | Admitting: Internal Medicine

## 2013-07-23 VITALS — BP 145/84 | HR 103

## 2013-07-23 LAB — CBC, DIFF
% Basophils: 1 %
% Eosinophils: 4 %
% Immature Granulocytes: 0 %
% Lymphocytes: 38 %
% Monocytes: 11 %
% Neutrophils: 46 %
Absolute Eosinophil Count: 0.19 10*3/uL (ref 0.00–0.50)
Absolute Lymphocyte Count: 1.94 10*3/uL (ref 1.00–4.80)
Basophils: 0.03 10*3/uL (ref 0.00–0.20)
Hematocrit: 43 % (ref 38–50)
Hemoglobin: 14.2 g/dL (ref 13.0–18.0)
Immature Granulocytes: 0.01 10*3/uL (ref 0.00–0.05)
MCH: 29.8 pg (ref 27.3–33.6)
MCHC: 33.3 g/dL (ref 32.2–36.5)
MCV: 90 fL (ref 81–98)
Monocytes: 0.54 10*3/uL (ref 0.00–0.80)
Neutrophils: 2.37 10*3/uL (ref 1.80–7.00)
Platelet Count: 273 10*3/uL (ref 150–400)
RBC: 4.76 mil/uL (ref 4.40–5.60)
RDW-CV: 13.8 % (ref 11.6–14.4)
WBC: 5.08 10*3/uL (ref 4.3–10.0)

## 2013-07-23 LAB — COMPREHENSIVE METABOLIC PANEL
ALT (GPT): 69 U/L — ABNORMAL HIGH (ref 10–48)
AST (GOT): 114 U/L — ABNORMAL HIGH (ref 15–40)
Albumin: 4.3 g/dL (ref 3.5–5.2)
Alkaline Phosphatase (Total): 55 U/L (ref 39–139)
Anion Gap: 10 (ref 3–11)
Bilirubin (Total): 0.5 mg/dL (ref 0.2–1.3)
Calcium: 9.9 mg/dL (ref 8.9–10.2)
Carbon Dioxide, Total: 26 mEq/L (ref 22–32)
Chloride: 103 mEq/L (ref 98–108)
Creatinine: 0.72 mg/dL (ref 0.51–1.18)
GFR, Calc, African American: >60 mL/min (ref >59)
GFR, Calc, European American: >60 mL/min (ref >59)
Glucose: 116 mg/dL (ref 62–125)
Potassium: 3.8 mEq/L (ref 3.7–5.2)
Protein (Total): 7.5 g/dL (ref 6.0–8.2)
Sodium: 139 mEq/L (ref 136–145)
Urea Nitrogen: 8 mg/dL (ref 8–21)

## 2013-07-23 LAB — PR A1C RAPID, ONSITE: Hemoglobin A1C: 5.6 % (ref 4.0–6.0)

## 2013-07-23 MED ORDER — LISINOPRIL 20 MG OR TABS
20.0000 mg | ORAL_TABLET | Freq: Every day | ORAL | Status: DC
Start: 2013-07-23 — End: 2013-12-04

## 2013-07-23 NOTE — Patient Instructions (Addendum)
Aspirin 81mg  daily      Increase lisinopril to 20mg  daily

## 2013-07-23 NOTE — Progress Notes (Signed)
CHIEF COMPLAINT:  Roger [name] is a 52 year old male here to discuss diabetes, and prostate cancer.    HISTORY OF PRESENT ILLNESS OR INTERVAL HISTORY:  Takes 5 daily for penile rehab, then took 10 prior to intercourse without effect.    He will be seeing Dr. Clearance Coots next month to discuss.  He is getting frustrated about not seeing Dr. Clearance Coots, but did have to reschedule next month due to his work schedule.    He knows it can take some time, but he is anxious to get back to normal.        Tingling on the feet are better.    He needs additional test strips.   Allergies are better.      He has completed oxycodone.  He is not taking ibuprofen.   He is not needing any pain medication.    He is not drinking daily, drinks 3 times per week.  Mostly <3 drinks at a time.      Tingling is better.      Review of Systems   Constitutional: Negative.    Genitourinary:        Erectile dysfunction   Psychiatric/Behavioral: Negative.          PROBLEM LIST:  Reviewed and updated in the electronic medical record under Problem List.    This includes pertinent past medical and surgical history.    Patient Active Problem List    Diagnosis Date Noted   . Prostate cancer [185] 05/16/2013   . Anxiety [300.00] 04/29/2012   . 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 FAMILY HX GI MALIGNANCY [V16.0] 05/01/2002   . HYPERCALCEMIA [275.42] 06/11/2001   . ELEV TRANSAMINASE/LDH [790.4] 06/11/2001   . HYPERTENSION NOS [401.9] 06/02/2001         MEDICATIONS:  Reviewed and confirmed in the electronic medical record     ALLERGIES:  Reviewed and confirmed in the electronic medical record     SOCIAL HISTORY:  Reviewed and confirmed in the electronic medical record             Physical Exam   Constitutional: He is oriented to person, place, and time. He appears well-developed and well-nourished. No distress.   Cardiovascular: Normal rate, regular rhythm and normal heart sounds.  Exam reveals no gallop and no  friction rub.    No murmur heard.  Pulmonary/Chest: Effort normal and breath sounds normal. No respiratory distress. He has no wheezes. He has no rales.   Neurological: He is alert and oriented to person, place, and time.   Skin: He is not diaphoretic.   Psychiatric: He has a normal mood and affect. His behavior is normal. Judgment and thought content normal.       ASSESSMENT AND PLAN:  (401.9) HYPERTENSION NOS  (primary encounter diagnosis)  Increased lisinopril to 20mg  daily.        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  He is using prn cialis, will check with Dr. Clearance Coots to determine options for  penile rehab, has f/u next month.

## 2013-08-03 ENCOUNTER — Telehealth (INDEPENDENT_AMBULATORY_CARE_PROVIDER_SITE_OTHER): Payer: Self-pay | Admitting: Internal Medicine

## 2013-08-03 ENCOUNTER — Encounter (INDEPENDENT_AMBULATORY_CARE_PROVIDER_SITE_OTHER): Payer: Self-pay | Admitting: Internal Medicine

## 2013-08-03 MED ORDER — AMLODIPINE BESYLATE 10 MG OR TABS
10.0000 mg | ORAL_TABLET | Freq: Every day | ORAL | Status: DC
Start: 2013-08-03 — End: 2013-12-04

## 2013-08-03 NOTE — Telephone Encounter (Signed)
CONFIRMED PHONE NUMBER: [phone]  CALLERS FIRST AND LAST NAME: Roger [name]    FACILITY NAME: na TITLE: na  CALLERS RELATIONSHIP:Self  RETURN CALL: Detailed message on voicemail only     SUBJECT: Prescription Management   REASON FOR REQUEST: Pt forgot dosage 1 or 2 a day.    MEDICATION: Baby Asprin  CONCERN/QUESTION: See Above  PRESCRIBING PROVIDER: Azen  DOSE TAKING NOW: 81 mg  PHARMACY NAME, LOCATION, & PHONE #: na

## 2013-08-03 NOTE — Telephone Encounter (Signed)
Spoke to and advised that per medication list pt should be taking Aspirin 81mg  1 tab daily.   Pt states he is out of Lisinopril and Amlodipine; states he had a paper Rx for Lisinopril and he lost it.   Rx for Lisinopril called in to pharmacy.     Pt requesting refill of Amlodipine. Advised that it will probably be addressed next week, pt agreeable.   Routing to covering provider.

## 2013-08-16 ENCOUNTER — Other Ambulatory Visit (INDEPENDENT_AMBULATORY_CARE_PROVIDER_SITE_OTHER): Payer: Self-pay | Admitting: Urology

## 2013-08-27 ENCOUNTER — Encounter (INDEPENDENT_AMBULATORY_CARE_PROVIDER_SITE_OTHER): Payer: PPO | Admitting: Internal Medicine

## 2013-08-28 ENCOUNTER — Inpatient Hospital Stay
Admit: 2013-08-28 | Discharge: 2013-09-07 | Disposition: A | Discharge status: Home / Self Care | Payer: PRIVATE HEALTH INSURANCE | Attending: Internal Medicine | Admitting: Internal Medicine

## 2013-08-28 ENCOUNTER — Encounter (INDEPENDENT_AMBULATORY_CARE_PROVIDER_SITE_OTHER): Payer: Self-pay | Admitting: Internal Medicine

## 2013-08-28 DIAGNOSIS — F10931 Alcohol use, unspecified with withdrawal delirium: Secondary | ICD-10-CM

## 2013-08-28 LAB — METABOLIC PANEL, BASIC
Anion gap: 11 mmol/L (ref 7–16)
BUN: 7 MG/DL (ref 6–23)
CO2: 24 mmol/L (ref 21–32)
Calcium: 7.6 MG/DL — ABNORMAL LOW (ref 8.3–10.4)
Chloride: 88 mmol/L — ABNORMAL LOW (ref 98–107)
Creatinine: 0.7 MG/DL — ABNORMAL LOW (ref 0.8–1.5)
GFR est AA: >60 mL/min/{1.73_m2} (ref >60)
GFR est non-AA: >60 mL/min/{1.73_m2} (ref >60)
Glucose: 106 mg/dL — ABNORMAL HIGH (ref 65–100)
Potassium: 3 mmol/L — ABNORMAL LOW (ref 3.5–5.1)
Sodium: 123 mmol/L — CL (ref 136–145)

## 2013-08-28 LAB — EKG, 12 LEAD, SUBSEQUENT
Atrial Rate: 90 {beats}/min
Calculated P Axis: 64 degrees
Calculated R Axis: 74 degrees
Calculated T Axis: 83 degrees
P-R Interval: 212 ms
Q-T Interval: 404 ms
QRS Duration: 104 ms
QTC Calculation (Bezet): 494 ms
Ventricular Rate: 90 {beats}/min

## 2013-08-28 LAB — CBC WITH AUTOMATED DIFF
ABS. BASOPHILS: 0 10*3/uL (ref 0.0–0.2)
ABS. EOSINOPHILS: 0 10*3/uL (ref 0.0–0.8)
ABS. IMM. GRANS.: 0.1 10*3/uL (ref 0.0–0.5)
ABS. LYMPHOCYTES: 0.8 10*3/uL (ref 0.5–4.6)
ABS. MONOCYTES: 0.7 10*3/uL (ref 0.1–1.3)
ABS. NEUTROPHILS: 6.8 10*3/uL (ref 1.7–8.2)
BASOPHILS: 0 % (ref 0.0–2.0)
EOSINOPHILS: 0 % — ABNORMAL LOW (ref 0.5–7.8)
HCT: 44 % (ref 41.1–50.3)
HGB: 14.6 g/dL (ref 13.6–17.2)
IMMATURE GRANULOCYTES: 0.7 % (ref 0.0–5.0)
LYMPHOCYTES: 9 % — ABNORMAL LOW (ref 13–44)
MCH: 34.9 PG — ABNORMAL HIGH (ref 26.1–32.9)
MCHC: 33.2 g/dL (ref 31.4–35.0)
MCV: 105.3 FL — ABNORMAL HIGH (ref 79.6–97.8)
MONOCYTES: 8 % (ref 4.0–12.0)
MPV: 10 FL — ABNORMAL LOW (ref 10.8–14.1)
NEUTROPHILS: 82 % — ABNORMAL HIGH (ref 43–78)
PLATELET: 155 10*3/uL (ref 150–450)
RBC: 4.18 M/uL — ABNORMAL LOW (ref 4.23–5.67)
RDW: 11.8 % — ABNORMAL LOW (ref 11.9–14.6)
WBC: 8.3 10*3/uL (ref 4.3–11.1)

## 2013-08-28 LAB — METABOLIC PANEL, COMPREHENSIVE
A-G Ratio: 0.7 — ABNORMAL LOW (ref 1.2–3.5)
ALT (SGPT): 98 U/L — ABNORMAL HIGH (ref 12–65)
AST (SGOT): 177 U/L — ABNORMAL HIGH (ref 15–37)
Albumin: 3.1 g/dL — ABNORMAL LOW (ref 3.5–5.0)
Alk. phosphatase: 79 U/L (ref 50–136)
Anion gap: 17 mmol/L — ABNORMAL HIGH (ref 7–16)
BUN: 6 MG/DL (ref 6–23)
Bilirubin, total: 1.2 MG/DL — ABNORMAL HIGH (ref 0.2–1.1)
CO2: 20 mmol/L — ABNORMAL LOW (ref 21–32)
Calcium: 8.3 MG/DL (ref 8.3–10.4)
Chloride: 80 mmol/L — ABNORMAL LOW (ref 98–107)
Creatinine: 0.89 MG/DL (ref 0.8–1.5)
GFR est AA: >60 mL/min/{1.73_m2} (ref >60)
GFR est non-AA: >60 mL/min/{1.73_m2} (ref >60)
Globulin: 4.5 g/dL — ABNORMAL HIGH (ref 2.3–3.5)
Glucose: 141 mg/dL — ABNORMAL HIGH (ref 65–100)
Potassium: 3.5 mmol/L (ref 3.5–5.1)
Protein, total: 7.6 g/dL (ref 6.3–8.2)
Sodium: 117 mmol/L — CL (ref 136–145)

## 2013-08-28 LAB — DRUG SCREEN, URINE
AMPHETAMINES: NEGATIVE
BARBITURATES: NEGATIVE
BENZODIAZEPINES: NEGATIVE
COCAINE: NEGATIVE
METHADONE: NEGATIVE
OPIATES: NEGATIVE
PCP(PHENCYCLIDINE): NEGATIVE
THC (TH-CANNABINOL): POSITIVE

## 2013-08-28 LAB — URINE MICROSCOPIC: Bacteria: 0 /hpf

## 2013-08-28 LAB — EKG, 12 LEAD, INITIAL
Atrial Rate: 197 {beats}/min
Calculated R Axis: 76 degrees
Calculated T Axis: 125 degrees
Q-T Interval: 314 ms
QRS Duration: 98 ms
QTC Calculation (Bezet): 415 ms
Ventricular Rate: 105 {beats}/min

## 2013-08-28 LAB — CK WITH MB
CK - MB: 3.5 ng/ml (ref 0.5–3.6)
CK-MB Index: 1.6 % (ref <2.5)
CK: 216 U/L — ABNORMAL HIGH (ref 21–215)

## 2013-08-28 LAB — MRSA SCREEN - PCR (NASAL)

## 2013-08-28 LAB — PROTHROMBIN TIME + INR
INR: 1.1 (ref 0.9–1.2)
Prothrombin time: 11.5 s (ref 9.6–11.6)

## 2013-08-28 LAB — PTT: aPTT: 25.8 s (ref 23.5–31.7)

## 2013-08-28 LAB — PROCALCITONIN: Procalcitonin: <0.1 ng/mL

## 2013-08-28 LAB — LACTIC ACID - RESPIRATORY: Lactic acid: 8.2 MMOL/L — ABNORMAL HIGH (ref 0.4–2.0)

## 2013-08-28 LAB — ETHYL ALCOHOL: ALCOHOL(ETHYL),SERUM: 27 MG/DL

## 2013-08-28 LAB — TSH 3RD GENERATION: TSH: 1.98 u[IU]/mL (ref 0.358–3.740)

## 2013-08-28 LAB — HIV-1 SCREEN: HIV-1 screen: NONREACTIVE

## 2013-08-28 LAB — AMMONIA: Ammonia, plasma: 44 umol/L — ABNORMAL HIGH (ref 11–32)

## 2013-08-28 LAB — MAGNESIUM
Magnesium: 1.5 mg/dL — ABNORMAL LOW (ref 1.8–2.4)
Magnesium: 3.3 mg/dL — ABNORMAL HIGH (ref 1.8–2.4)

## 2013-08-28 MED ADMIN — lorazepam (ATIVAN) injection 1 mg: INTRAVENOUS | @ 14:00:00 | NDC 00641604401

## 2013-08-28 MED ADMIN — 0.9% sodium chloride with KCl 20 mEq/L infusion: INTRAVENOUS | @ 19:00:00 | NDC 00409711509

## 2013-08-28 MED ADMIN — levofloxacin (LEVAQUIN) 750 mg in D5W IVPB: INTRAVENOUS | @ 16:00:00 | NDC 25021013268

## 2013-08-28 MED ADMIN — lorazepam (ATIVAN) injection 1 mg: INTRAVENOUS | @ 15:00:00 | NDC 00641604401

## 2013-08-28 MED ADMIN — lorazepam (ATIVAN) injection 2 mg: INTRAVENOUS | @ 19:00:00 | NDC 00641604401

## 2013-08-28 MED ADMIN — potassium chloride 20 mEq in 100 ml IVPB: INTRAVENOUS | @ 19:00:00 | NDC 00338070548

## 2013-08-28 MED ADMIN — lorazepam (ATIVAN) injection 1 mg: INTRAVENOUS | @ 21:00:00 | NDC 00409677802

## 2013-08-28 MED ADMIN — ceftriaxone (ROCEPHIN) 1 g in 0.9% sodium chloride (MBP/ADV) 50 mL MBP: INTRAVENOUS | @ 15:00:00 | NDC 00409733201

## 2013-08-28 MED ADMIN — dextrose 5 % - 0.45% NaCl 1,000 mL with magnesium sulfate 2 g, folic acid 1 mg, thiamine 100 mg, mvi, adult no.4 with vit K 3,300 unit- 150 mcg/10 mL 10 mL infusion: INTRAVENOUS | @ 15:00:00 | NDC 50428459962

## 2013-08-28 MED ADMIN — magnesium sulfate 4 g/100 ml IVPB: INTRAVENOUS | @ 15:00:00 | NDC 00409672923

## 2013-08-28 MED ADMIN — 0.9% sodium chloride 1,000 mL with mvi, adult no.4 with vit K 3,300 unit- 150 mcg/10 mL 10 mL, thiamine 100 mg, folic acid 1 mg infusion: INTRAVENOUS | @ 14:00:00 | NDC 63323018410

## 2013-08-28 MED ADMIN — sodium chloride 0.9 % bolus infusion 1,000 mL: INTRAVENOUS | @ 16:00:00 | NDC 87701099893

## 2013-08-28 MED ADMIN — sodium chloride 0.9 % bolus infusion 1,000 mL: INTRAVENOUS | @ 14:00:00 | NDC 00409798309

## 2013-08-28 MED ADMIN — haloperidol lactate (HALDOL) injection 1 mg: INTRAVENOUS | @ 23:00:00 | NDC 10147091101

## 2013-08-28 MED ADMIN — lorazepam (ATIVAN) injection 2 mg: INTRAVENOUS | @ 21:00:00 | NDC 00409677802

## 2013-08-28 MED ADMIN — lorazepam (ATIVAN) injection 2 mg: INTRAVENOUS | @ 20:00:00 | NDC 00641604401

## 2013-08-28 MED ADMIN — lorazepam (ATIVAN) injection 2 mg: INTRAVENOUS | @ 17:00:00 | NDC 00641604401

## 2013-08-28 MED ADMIN — lorazepam (ATIVAN) injection 2 mg: INTRAVENOUS | @ 23:00:00 | NDC 00641604401

## 2013-08-28 MED ADMIN — heparin (porcine) injection 5,000 Units: SUBCUTANEOUS | @ 19:00:00 | NDC 00069005904

## 2013-08-28 MED ADMIN — potassium chloride 20 mEq in 100 ml IVPB: INTRAVENOUS | @ 21:00:00 | NDC 00338070548

## 2013-08-28 MED ADMIN — sodium chloride (NS) flush 5-10 mL: INTRAVENOUS | @ 17:00:00 | NDC 87701099893

## 2013-08-28 NOTE — ED Notes (Signed)
MD in to admit pt

## 2013-08-28 NOTE — ED Notes (Signed)
Pt still pulling at lines. Order from Dr. Rolan Lipa for restraints. No restraints on floor. Materials called for some. Family at beside trying to prevent him from pulling lines.

## 2013-08-28 NOTE — Progress Notes (Signed)
TRANSFER - IN REPORT:    Verbal report received from [place], RN on Brett Becker  being received from ED for urgent transfer      Report consisted of patient???s Situation, Background, Assessment and   Recommendations(SBAR).     Information from the following report(s) ED Summary, MAR and Recent Results was reviewed with the receiving nurse.    Opportunity for questions and clarification was provided.      Assessment completed upon patient???s arrival to unit and care assumed.

## 2013-08-28 NOTE — Progress Notes (Signed)
Confusion persists. Attempting to put legs out of bed. Stating he needs to urinate. Tried to make him understand that his urine is being drained with use of a foley catheter. Showed him catheter and explained the rationale for its use. Does not appear to comprehend. Oriented only to person. Requires reorientation to place, time and events. Forgets easily.

## 2013-08-28 NOTE — Consults (Signed)
LEAPFROG PROTOCOL NOTE    Brett Becker  08/28/2013    The patient is currently in the critical care setting managed by the hospitalists with etoh withdrawal.  The patient's chart is reviewed and the patient is discussed with the staff.  Patient is currently hemodynamically stable.  Patient has no needs identified for Intensivist management in the critical care setting at this time.  Please notify us if can be of assistance.  Marland Kitchen      [name], MD

## 2013-08-28 NOTE — ED Notes (Signed)
Marchelle Folks tech has drawn blood cultures x2, blood work, and lactic acid

## 2013-08-28 NOTE — ED Notes (Signed)
Brett Becker tech in to place foley cath

## 2013-08-28 NOTE — ED Notes (Signed)
Pt too shaky to get a good ekg

## 2013-08-28 NOTE — Progress Notes (Signed)
Report received. Chart reviewed. Restless. Restrained. Tachycardic and hypertensive.

## 2013-08-28 NOTE — ED Notes (Signed)
Per ems pt went to his neighbor's house with shaking, confusion, and pale in color. Per ems pt's oxygen in 80s upon their arrival.

## 2013-08-28 NOTE — Progress Notes (Signed)
Daughter called unit. Update given. Patient dozing at short intervals

## 2013-08-28 NOTE — ED Notes (Signed)
TRANSFER - OUT REPORT:    Verbal report given to Alisa RN(name) on Brett Becker  being transferred to 3105(unit) for routine progression of care       Report consisted of patient???s Situation, Background, Assessment and   Recommendations(SBAR).     Information from the following report(s) ED Summary was reviewed with the receiving nurse.    Opportunity for questions and clarification was provided.

## 2013-08-28 NOTE — ED Provider Notes (Signed)
HPI Comments: 77 y male chronic alcoholic who continues to drink but has had NV for last 3 days. Patient was drinking last night and this morning after sipping a few ounces off a tall boy had a tonic clonic seizure witnessed by his drinking buddy.    Patient is a 52 y.o. male presenting with altered mental status. The history is provided by the patient, a relative and a friend.   Altered mental status   This is a new problem. The current episode started 3 to 5 hours ago. The problem has not changed since onset.Associated symptoms include confusion, seizures and unresponsiveness. Mental status baseline is normal.  Risk factors include alcohol intake and a recent illness. His past medical history is significant for liver disease. His past medical history does not include seizures.        History reviewed. No pertinent past medical history.     Past Surgical History   Procedure Laterality Date   ??? Pr neurological procedure unlisted           History reviewed. No pertinent family history.     History     Social History   ??? Marital Status: MARRIED     Spouse Name: N/A     Number of Children: N/A   ??? Years of Education: N/A     Occupational History   ??? Not on file.     Social History Main Topics   ??? Smoking status: Current Every Day Smoker -- 1.00 packs/day   ??? Smokeless tobacco: Not on file   ??? Alcohol Use: Yes   ??? Drug Use: Not on file   ??? Sexually Active: Not on file     Other Topics Concern   ??? Not on file     Social History Narrative   ??? No narrative on file                  ALLERGIES: Review of patient's allergies indicates no known allergies.      Review of Systems   Constitutional: Negative.  Negative for activity change.   HENT: Negative.    Eyes: Negative.    Respiratory: Negative.    Cardiovascular: Negative.    Gastrointestinal: Positive for nausea and vomiting. Negative for abdominal pain.   Genitourinary: Negative.    Musculoskeletal: Negative.    Skin: Positive for pallor.   Neurological: Positive for  tremors and seizures.   Psychiatric/Behavioral: Positive for confusion and altered mental status.   All other systems reviewed and are negative.        Filed Vitals:    08/28/13 0830 08/28/13 0845 08/28/13 0853 08/28/13 0855   BP: 148/92 147/109     Pulse: 103 102 101    Temp:       SpO2: 94% 94%  94%            Physical Exam   Nursing note and vitals reviewed.  Constitutional: He is oriented to person, place, and time. He appears well-developed and well-nourished.   HENT:   Head: Normocephalic and atraumatic.   Right Ear: External ear normal.   Left Ear: External ear normal.   Eyes: Conjunctivae and EOM are normal. Pupils are equal, round, and reactive to light.   Neck: Normal range of motion. Neck supple.   Cardiovascular: Normal rate, regular rhythm and intact distal pulses.    Pulmonary/Chest: Effort normal and breath sounds normal.   Abdominal: Soft. Bowel sounds are normal.   Musculoskeletal: Normal range of  motion.   Neurological: He is alert and oriented to person, place, and time. No cranial nerve deficit.   Skin: Skin is warm and dry.   Psychiatric: He has a normal mood and affect.        MDM     Differential Diagnosis; Clinical Impression; Plan:     Patient had second seizure in ED tremors increasing. Patient admitted  Amount and/or Complexity of Data Reviewed:   Clinical lab tests:  Ordered and reviewed  Tests in the radiology section of CPT??:  Ordered and reviewed  Tests in the medicine section of the CPT??:  Ordered and reviewed   Discuss the patient with another provider:  Yes   Independant visualization of image, tracing, or specimen:  Yes  Risk of Significant Complications, Morbidity, and/or Mortality:   Presenting problems:  High  Diagnostic procedures:  High  Management options:  High  General Comments: Total critical care time 35 minutes excluding separately billable procedures.   Critical Care:   Total time providing critical care:  30-74 minutes (Excluding all other billable procedures.)   Progress:   Patient progress:  Stable      Procedures

## 2013-08-28 NOTE — H&P (Signed)
HOSPITALIST H&P/CONSULT    NAME:  Brett Becker   Age:  52 y.o.  DOB:   1961-01-31   MRN:   [id number]  PCP: PROVIDER UNKNOWN    Subjective:     Patient is a 52 y.o. white  male with history of chronic heavy alcohol abuse presents after experiencing seizure.  Pt had N/V for last 3 days and experienced a seizure while with his friend.  Pt also had tonic-clonic seizure in ED.  He is able to answer some questions, but is clearly altered.  His daughter is at bedside to assist with history.  She does not see him often, but states he drinks "a lot" and has never had a seizure before.    History reviewed. No pertinent past medical history.   Past Surgical History   Procedure Laterality Date   ??? Pr neurological procedure unlisted        Current Facility-Administered Medications   Medication Dose Route Frequency   ??? [COMPLETED] lorazepam (ATIVAN) injection 1 mg  1 mg IntraVENous NOW   ??? [COMPLETED] sodium chloride 0.9 % bolus infusion 1,000 mL  1,000 mL IntraVENous ONCE   ??? [COMPLETED] 0.9% sodium chloride 1,000 mL with mvi, adult no.4 with vit K 3,300 unit- 150 mcg/10 mL 10 mL, thiamine 100 mg, folic acid 1 mg infusion   IntraVENous ONCE   ??? [COMPLETED] lorazepam (ATIVAN) injection 1 mg  1 mg IntraVENous NOW   ??? [COMPLETED] ceftriaxone (ROCEPHIN) 1 g in 0.9% sodium chloride (MBP/ADV) 50 mL MBP  1 g IntraVENous NOW   ??? levofloxacin (LEVAQUIN) 750 mg in D5W IVPB  750 mg IntraVENous NOW   ??? magnesium sulfate 4 g/100 ml IVPB  4 g IntraVENous ONCE   ??? [COMPLETED] lorazepam (ATIVAN) injection 1 mg  1 mg IntraVENous NOW   ??? sodium chloride (NS) flush 5-10 mL  5-10 mL IntraVENous Q8H   ??? sodium chloride (NS) flush 5-10 mL  5-10 mL IntraVENous PRN   ??? lorazepam (ATIVAN) injection 1 mg  1 mg IntraVENous Q15MIN PRN   ??? dextrose 5 % - 0.45% NaCl 1,000 mL with magnesium sulfate 2 g, folic acid 1 mg, thiamine 100 mg, mvi, adult no.4 with vit K 3,300 unit- 150 mcg/10 mL 10 mL infusion   IntraVENous DAILY   ??? lorazepam (ATIVAN) injection 2 mg   2 mg IntraVENous Q1H PRN   ??? lorazepam (ATIVAN) injection 1 mg  1 mg IntraVENous Q1H PRN   ??? haloperidol lactate (HALDOL) injection 1 mg  1 mg IntraVENous Q2H PRN   ??? ondansetron (ZOFRAN) injection 4 mg  4 mg IntraVENous Q6H PRN   ??? sodium chloride 0.9 % bolus infusion 1,000 mL  1,000 mL IntraVENous ONCE   ??? heparin (porcine) injection 5,000 Units  5,000 Units SubCUTAneous Q8H     Current Outpatient Prescriptions   Medication Sig   ??? naproxen (NAPROSYN) 500 mg tablet Take 500 mg by mouth two (2) times daily (with meals).     No Known Allergies   History   Substance Use Topics   ??? Smoking status: Current Every Day Smoker -- 1.00 packs/day   ??? Smokeless tobacco: Not on file   ??? Alcohol Use: Yes      History reviewed. No pertinent family history.  I have personally reviewed and reconciled patients history.    Review of Systems  Unable to reliably attain ROS due to altered mental status.    Objective:     Patient Vitals for the  past 24 hrs:   BP Temp Pulse Resp SpO2   08/28/13 1132 - - 93 21 94 %   08/28/13 1125 133/76 mmHg - 105 - 94 %   08/28/13 1119 - - - 18 -   08/28/13 1116 140/82 mmHg 97.3 ??F (36.3 ??C) - - -   08/28/13 1110 148/86 mmHg - 114 - 95 %   08/28/13 1107 148/86 mmHg - 113 - 93 %   08/28/13 1052 158/118 mmHg - 113 - -   08/28/13 1037 - - 122 - 96 %   08/28/13 1030 136/98 mmHg - - - 96 %   08/28/13 1028 - - 119 19 96 %   08/28/13 1018 125/93 mmHg - 128 - 96 %   08/28/13 0932 - - 97 - 100 %   08/28/13 0930 156/73 mmHg - 101 - 94 %   08/28/13 0915 149/73 mmHg - 102 - 92 %   08/28/13 0901 - - 111 - 92 %   08/28/13 0900 171/67 mmHg - - - -   08/28/13 0855 - - - - 94 %   08/28/13 0853 - - 101 - -   08/28/13 0845 147/109 mmHg - 102 - 94 %   08/28/13 0830 148/92 mmHg - 103 - 94 %   08/28/13 0815 142/87 mmHg - - - -   08/28/13 0811 - - 109 - 95 %   08/28/13 0800 148/74 mmHg - 109 - 92 %   08/28/13 0759 - 98 ??F (36.7 ??C) - - -   08/28/13 0745 145/79 mmHg - - - -   08/28/13 0744 - - 108 - 93 %   08/28/13 0735  139/102 mmHg - - - -   08/28/13 0734 - - 102 - 93 %             Exam:  General: awake, alert, whole body tremors  HEENT: Normocephalic, atraumatic, PERRL but sluggish, dry MM  Neck: Supple, trachea midline, no palpable lymphadenopathy  Lungs: Clear to auscultation bilaterally.  No rales, wheezes, or rhonchi  Heart: Regular rate and rhythm.  No appreciable murmur, rub, gallop, or ectopy  Abdomen: Soft, nontender, nondistended.  Bowel sounds normal.  No palpable masses or organomegaly. No rebound tenderness, guarding, or rigidity.  Abd is thin.  Extremities:  Extremities atraumatic.  No cyanosis.  No edema.  Skin: Skin color, texture, and turgor normal.  No rashes or lesions.  Neurologic: AOx1.  Bilateral UE tremors    ECG: Appears to be sinus rhythm, but significant motion artifact and wandering baseline.  No obvious evidence of ischemia.  I have personally reviewed and interpreted ECG.    Data Review (Labs):   Recent Results (from the past 24 hour(s))   LACTIC ACID - RESPIRATORY    Collection Time     08/28/13  8:12 AM       Result Value Range    Lactic acid 8.2 (*) 0.4 - 2.0 MMOL/L   ETHYL ALCOHOL    Collection Time     08/28/13  8:15 AM       Result Value Range    ALCOHOL(ETHYL),SERUM 27     MAGNESIUM    Collection Time     08/28/13  8:16 AM       Result Value Range    Magnesium 1.5 (*) 1.8 - 2.4 mg/dL   PTT    Collection Time     08/28/13  8:16 AM       Result Value Range  aPTT 25.8  23.5 - 31.7 SEC   PROTHROMBIN TIME + INR    Collection Time     08/28/13  8:16 AM       Result Value Range    Prothrombin time 11.5  9.6 - 11.6 sec    INR 1.1  0.9 - 1.2     CBC WITH AUTOMATED DIFF    Collection Time     08/28/13  8:16 AM       Result Value Range    WBC 8.3  4.3 - 11.1 K/uL    RBC 4.18 (*) 4.23 - 5.67 M/uL    HGB 14.6  13.6 - 17.2 g/dL    HCT 16.1  09.6 - 04.5 %    MCV 105.3 (*) 79.6 - 97.8 FL    MCH 34.9 (*) 26.1 - 32.9 PG    MCHC 33.2  31.4 - 35.0 g/dL    RDW 40.9 (*) 81.1 - 14.6 %    PLATELET 155  150 - 450 K/uL     MPV 10.0 (*) 10.8 - 14.1 FL    DF AUTOMATED      NEUTROPHILS 82 (*) 43 - 78 %    LYMPHOCYTES 9 (*) 13 - 44 %    MONOCYTES 8  4.0 - 12.0 %    EOSINOPHILS 0 (*) 0.5 - 7.8 %    BASOPHILS 0  0.0 - 2.0 %    IMMATURE GRANULOCYTES 0.7  0.0 - 5.0 %    ABS. NEUTROPHILS 6.8  1.7 - 8.2 K/UL    ABS. LYMPHOCYTES 0.8  0.5 - 4.6 K/UL    ABS. MONOCYTES 0.7  0.1 - 1.3 K/UL    ABS. EOSINOPHILS 0.0  0.0 - 0.8 K/UL    ABS. BASOPHILS 0.0  0.0 - 0.2 K/UL    ABS. IMM. GRANS. 0.1  0.0 - 0.5 K/UL   METABOLIC PANEL, COMPREHENSIVE    Collection Time     08/28/13  8:16 AM       Result Value Range    Sodium 117 (*) 136 - 145 mmol/L    Potassium 3.5  3.5 - 5.1 mmol/L    Chloride 80 (*) 98 - 107 mmol/L    CO2 20 (*) 21 - 32 mmol/L    Anion gap 17 (*) 7 - 16 mmol/L    Glucose 141 (*) 65 - 100 mg/dL    BUN 6  6 - 23 MG/DL    Creatinine 9.14  0.8 - 1.5 MG/DL    GFR est AA >78  >29 ml/min/1.42m2    GFR est non-AA >60  >60 ml/min/1.52m2    Calcium 8.3  8.3 - 10.4 MG/DL    Bilirubin, total 1.2 (*) 0.2 - 1.1 MG/DL    ALT 98 (*) 12 - 65 U/L    AST 177 (*) 15 - 37 U/L    Alk. phosphatase 79  50 - 136 U/L    Protein, total 7.6  6.3 - 8.2 g/dL    Albumin 3.1 (*) 3.5 - 5.0 g/dL    Globulin 4.5 (*) 2.3 - 3.5 g/dL    A-G Ratio 0.7 (*) 1.2 - 3.5     TSH, 3RD GENERATION    Collection Time     08/28/13  8:16 AM       Result Value Range    TSH 1.980  0.358 - 3.740 uIU/mL   CK WITH MB    Collection Time     08/28/13  8:16 AM  Result Value Range    CK 216 (*) 21 - 215 U/L    CK - MB 3.5  0.5 - 3.6 ng/ml    CK-MB Index 1.6  <2.5 %   PROCALCITONIN    Collection Time     08/28/13  8:16 AM       Result Value Range    Procalcitonin <0.1     EKG, 12 LEAD, INITIAL    Collection Time     08/28/13  8:20 AM       Result Value Range    Systolic BP        Diastolic BP        Ventricular Rate 105      Atrial Rate 197      P-R Interval        QRS Duration 98      Q-T Interval 314      QTC Calculation (Bezet) 415      Calculated P Axis        Calculated R Axis 76       Calculated T Axis 125      Diagnosis        Value: !! AGE AND GENDER SPECIFIC ECG ANALYSIS !!      Accelerated Junctional rhythm with occasional Premature ventricular complexes      ST & T wave abnormality, consider inferior ischemia      Abnormal ECG      No previous ECGs available   AMMONIA    Collection Time     08/28/13  8:43 AM       Result Value Range    Ammonia 44 (*) 11 - 32 UMOL/L   DRUG SCREEN, URINE    Collection Time     08/28/13 10:14 AM       Result Value Range    PCP(PHENCYCLIDINE) NEGATIVE       BENZODIAZEPINE NEGATIVE       COCAINE NEGATIVE       AMPHETAMINE NEGATIVE       METHADONE NEGATIVE       THC (TH-CANNABINOL) POSITIVE      OPIATES NEGATIVE       BARBITURATES NEGATIVE      URINE MICROSCOPIC    Collection Time     08/28/13 10:14 AM       Result Value Range    WBC 0-3  0 /hpf    RBC 10-20  0 /hpf    Epithelial cells 0-3  0 /hpf    Bacteria 0  0 /hpf    Casts 0-3  0 /lpf       Assessment:   Principal Problem:    Delirium tremens (08/28/2013)    Active Problems:    Macrocytosis (08/28/2013)      Hyponatremia (08/28/2013)      Hypomagnesemia (08/28/2013)      Transaminitis (08/28/2013)      Lactic acidosis (08/28/2013)      Alcohol abuse (08/28/2013)      Nausea and vomiting (08/28/2013)      Altered mental status (08/28/2013)      Plan:     Admit to ICU for DT's  Ativan PRN per [facility] protocol  Repeat IVF bolus now  Monitor BMP Q6H for 24hr  Will eventually need GI consult due to possible PUD  Check HIV and Hep panel  Repeat ECG    Further management depends upon patient progress.    DVT prophylaxis: SQ heparin  Code Status: FULL  Plan of care discussed with: Patient and his daughter    Marthe Patch, DO

## 2013-08-28 NOTE — ED Notes (Signed)
Pt had a seizure like activity. Dr. Rolan Lipa at bedside. Ativan given per md order. Pt placed on oxygen. Pt was suctioned. Some blood noticed in mouth. Pt now awake and following some commands. Pt still shaking

## 2013-08-28 NOTE — ED Notes (Signed)
Pt unable to use urinal at this time

## 2013-08-28 NOTE — Progress Notes (Signed)
EKG completed. NSR with 1st degree heart block noted.

## 2013-08-28 NOTE — Progress Notes (Signed)
Pt arrived to room 3105 with RN and student RN at bedside. Pt is confused, restless, and agitated. Dual skin assessment with Hilda Lias, RN. No pressure ulcer noted.  Pin point scabbed abrasions to bilat shins. Allevin applied. MRSA swab sent. Bilat soft wrist restraints and draining foley noted. Personal belongings at bedside, wallet with ID card and no money noted, see doc flowsheet. Pt is now sleeping in bed.

## 2013-08-29 ENCOUNTER — Encounter (INDEPENDENT_AMBULATORY_CARE_PROVIDER_SITE_OTHER): Payer: PPO | Admitting: Urology

## 2013-08-29 ENCOUNTER — Ambulatory Visit: Payer: PPO | Attending: Urology | Admitting: Urology

## 2013-08-29 ENCOUNTER — Encounter (INDEPENDENT_AMBULATORY_CARE_PROVIDER_SITE_OTHER): Payer: Self-pay | Admitting: Urology

## 2013-08-29 VITALS — BP 129/93 | HR 116 | Resp 16 | Ht 67.25 in | Wt 203.0 lb

## 2013-08-29 DIAGNOSIS — C61 Malignant neoplasm of prostate: Secondary | ICD-10-CM | POA: Insufficient documentation

## 2013-08-29 LAB — PSA, DIAGNOSTIC/MONITORING: PSA, Diagnostic/Monitoring: <0.03 ng/mL (ref 0.00–4.00)

## 2013-08-29 LAB — METABOLIC PANEL, BASIC
Anion gap: 12 mmol/L (ref 7–16)
Anion gap: 13 mmol/L (ref 7–16)
BUN: 7 MG/DL (ref 6–23)
BUN: 8 MG/DL (ref 6–23)
CO2: 20 mmol/L — ABNORMAL LOW (ref 21–32)
CO2: 21 mmol/L (ref 21–32)
Calcium: 7.8 MG/DL — ABNORMAL LOW (ref 8.3–10.4)
Calcium: 7.8 MG/DL — ABNORMAL LOW (ref 8.3–10.4)
Chloride: 94 mmol/L — ABNORMAL LOW (ref 98–107)
Chloride: 96 mmol/L — ABNORMAL LOW (ref 98–107)
Creatinine: 0.7 MG/DL — ABNORMAL LOW (ref 0.8–1.5)
Creatinine: 0.62 MG/DL — ABNORMAL LOW (ref 0.8–1.5)
GFR est AA: >60 mL/min/{1.73_m2} (ref >60)
GFR est AA: >60 mL/min/{1.73_m2} (ref >60)
GFR est non-AA: >60 mL/min/{1.73_m2} (ref >60)
GFR est non-AA: >60 mL/min/{1.73_m2} (ref >60)
Glucose: 107 mg/dL — ABNORMAL HIGH (ref 65–100)
Glucose: 79 mg/dL (ref 65–100)
Potassium: 4.1 mmol/L (ref 3.5–5.1)
Potassium: 3.2 mmol/L — ABNORMAL LOW (ref 3.5–5.1)
Sodium: 126 mmol/L — ABNORMAL LOW (ref 136–145)
Sodium: 130 mmol/L — ABNORMAL LOW (ref 136–145)

## 2013-08-29 LAB — MAGNESIUM
Magnesium: 2.2 mg/dL (ref 1.8–2.4)
Magnesium: 2.1 mg/dL (ref 1.8–2.4)

## 2013-08-29 LAB — HEPATITIS PANEL, ACUTE
Hep B Core Ab, IgM: NEGATIVE
Hep B surface Ag screen: NEGATIVE
Hep C Virus Ab: >11 s/co ratio — ABNORMAL HIGH (ref 0.0–0.9)
Hepatitis A Ab, IgM: NEGATIVE

## 2013-08-29 MED ORDER — OXYCODONE-ACETAMINOPHEN 5-325 MG OR TABS
1.0000 | ORAL_TABLET | Freq: Four times a day (QID) | ORAL | Status: DC | PRN
Start: 2013-08-29 — End: 2013-08-29

## 2013-08-29 MED ORDER — OXYCODONE-ACETAMINOPHEN 5-325 MG OR TABS
1.0000 | ORAL_TABLET | Freq: Four times a day (QID) | ORAL | Status: DC | PRN
Start: 2013-08-29 — End: 2013-09-18

## 2013-08-29 MED ADMIN — potassium chloride (K-DUR, KLOR-CON) SR tablet 40 mEq: ORAL | @ 22:00:00 | NDC 68084036011

## 2013-08-29 MED ADMIN — lorazepam (ATIVAN) injection 2 mg: INTRAVENOUS | @ 06:00:00 | NDC 00641604401

## 2013-08-29 MED ADMIN — 0.9% sodium chloride with KCl 20 mEq/L infusion: INTRAVENOUS | @ 05:00:00 | NDC 00409711509

## 2013-08-29 MED ADMIN — pantoprazole (PROTONIX) injection 40 mg: INTRAVENOUS | @ 16:00:00 | NDC 63323018610

## 2013-08-29 MED ADMIN — haloperidol lactate (HALDOL) injection 1 mg: INTRAVENOUS | @ 03:00:00 | NDC 10147091101

## 2013-08-29 MED ADMIN — 0.9% sodium chloride with KCl 20 mEq/L infusion: INTRAVENOUS | @ 15:00:00 | NDC 00409711509

## 2013-08-29 MED ADMIN — heparin (porcine) injection 5,000 Units: SUBCUTANEOUS | @ 03:00:00 | NDC 00069005904

## 2013-08-29 MED ADMIN — lorazepam (ATIVAN) injection 2 mg: INTRAVENOUS | @ 05:00:00 | NDC 00641604401

## 2013-08-29 MED ADMIN — sodium chloride (NS) flush 5-10 mL: INTRAVENOUS | @ 22:00:00 | NDC 87701099893

## 2013-08-29 MED ADMIN — heparin (porcine) injection 5,000 Units: SUBCUTANEOUS | @ 19:00:00 | NDC 00069005904

## 2013-08-29 MED ADMIN — heparin (porcine) injection 5,000 Units: SUBCUTANEOUS | @ 10:00:00 | NDC 00069005904

## 2013-08-29 MED ADMIN — sodium chloride (NS) flush 5-10 mL: INTRAVENOUS | @ 10:00:00 | NDC 87701099893

## 2013-08-29 MED ADMIN — sodium chloride (NS) flush 5-10 mL: INTRAVENOUS | @ 03:00:00 | NDC 87701099893

## 2013-08-29 MED ADMIN — lorazepam (ATIVAN) injection 2 mg: INTRAVENOUS | @ 07:00:00 | NDC 00641604401

## 2013-08-29 MED ADMIN — lorazepam (ATIVAN) injection 2 mg: INTRAVENOUS | @ 03:00:00 | NDC 00641604401

## 2013-08-29 MED ADMIN — haloperidol lactate (HALDOL) injection 1 mg: INTRAVENOUS | @ 07:00:00 | NDC 10147091101

## 2013-08-29 MED ADMIN — lorazepam (ATIVAN) injection 2 mg: INTRAVENOUS | @ 01:00:00 | NDC 00641604401

## 2013-08-29 MED ADMIN — 0.9% sodium chloride 1,000 mL with mvi, adult no.4 with vit K 3,300 unit- 150 mcg/10 mL 10 mL, thiamine 100 mg, folic acid 1 mg infusion: INTRAVENOUS | @ 13:00:00 | NDC 63323018410

## 2013-08-29 NOTE — Progress Notes (Signed)
Urology Follow-up     SUBJECTIVE:    Chief Complaint   Patient presents with   . Follow-Up      2 months follow up       History of Present Illness:  Roger [name] is a 52 year old male who returns  to me for prostate cancer.  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   Margins negative.       No leaking during day and night.  Occasional dribble when bending over out of the shower.  Doing kegel's.    Cialis 5mg  daily but no longer.  Using [facility] for penile rehab.  Able to get some erections.      Review of Systems  Const: Neg for fever, chills    Past Medical Hx:    Past Medical History   Diagnosis Date   . Unspecified essential hypertension 1996     on 3 drugs, some side effects from medications.    . Family history of colon cancer      brother died in 5s of colon cancer    . Type II or unspecified type diabetes mellitus without mention of complication, not stated as uncontrolled 2013       Past Surgical Hx:    Past Surgical History   Procedure Laterality Date   . No prior surgeries     . Colonoscopy stoma dx w/wo collj spec spx  2003     [facility], normal   . Colonoscopy stoma dx w/wo collj spec spx  2011       Past Family and Social Hx:  Mr. @LNAME  , family history includes Cancer in his brother and Hypertension in his mother., He,  reports that he has never smoked. He does not have any smokeless tobacco history on file. He reports that he drinks about 6.0 ounces of alcohol per week. He reports that he does not use illicit drugs..    Active Meds:    Outpatient Prescriptions Marked as Taking for the 08/29/13 encounter (Office Visit) with Cathlean Marseilles   Medication Sig Dispense Refill   . AmLODIPine Besylate 10 MG Oral Tab Take 1 tablet (10 mg) by mouth daily.  30 tablet  3   . Aspirin 81 MG Oral Tab Take 1 tablet (81 mg) by mouth daily.  30 tablet     . B-D ULTRAFINE III SHORT PEN 31G X 8 MM Does not apply Misc for insulin 3 needles a day       . Blood Glucose Monitoring Suppl (BLOOD  GLUCOSE MONITOR KIT) Does not apply Kit one meter for testing 1-3 times per day  1 Kit  11   . Glucose Blood (BLOOD GLUCOSE TEST STRIPS) In Vitro Strip test up to 3 times per day  100 Strip  11   . Lancets Thin Does not apply Misc for testing up to 3 times per day  200 Package  11   . Lisinopril 20 MG Oral Tab Take 1 tablet (20 mg) by mouth daily.  90 tablet  3   . MetFORMIN HCl 1000 MG Oral Tab TAKE 1 TABLET BY MOUTH DAILY FOR BLOOD SUGAR  90 Tab  0   . Multiple Vitamins-Minerals (MULTIVITAMIN & MINERAL OR) 1 tab a day       . Oxycodone-Acetaminophen 5-325 MG Oral Tab Take 1 tablet by mouth every 6 hours as needed for pain. For Pain.       . Tadalafil 5 MG  Oral Tab 1 po qday  30 Tab  5     No Facility-Administered Medications for the 08/29/13 encounter (Office Visit) with Cathlean Marseilles.       Allergies:    Allergies as of 08/29/2013 Loraine Leriche as Reviewed 08/29/2013   Allergen Reaction Noted   . Atenolol  12/19/2001       OBJECTIVE:  Physical Exam:  BP 129/93  Pulse 116  Resp 16  SpO2 96%  Constitutional: WDWN, Pleasant and appropriate affect and No acute distress  CV:  No peripheral swelling noted  Abdomen:  Incision sites healing well      Total time of visit was approximately 25 minutes of which > 50% was spent in counseling and or coordination of care    IMPRESSION AND PLAN     1. 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   Margins negative.  PSA pending.  Will repeat in 3-4 months.  2. ED.  Penile rehab with [facility].  Interested in learning penile injection therapy.  Getting partial erections at this point.  Should continue to improve.  Will refer to [facility] for teaching.  3. Diabetes.  4. Hypertension.  5. Obesity.  BMI 31.    Of note, I gave him Rx for #10 percocet becasuse of back pain after falling and he was seen in ED.  He is to follow up with Dr Carmina Miller in a week or so per his report.

## 2013-08-29 NOTE — Progress Notes (Addendum)
Late entry.    Spiritual Care visit.  Spiritual Care Assessment/Progress Notes    Brett Becker [id number]  [id number]    1961/06/07  52 y.o.  male    Patient Telephone Number: [phone] (home)   Religious Affiliation: [name]   Language: English   Extended Emergency Contact Information  Primary Emergency Contact: [facility] [place]  Home Phone: [phone]  Relation: None   Patient Active Problem List    Diagnosis Date Noted   ??? Delirium tremens 08/28/2013   ??? 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 Hyponatremia 08/28/2013   ??? Hypomagnesemia 08/28/2013   ??? Transaminitis 08/28/2013   ??? Lactic acidosis 08/28/2013   ??? Alcohol abuse 08/28/2013   ??? Nausea and vomiting 08/28/2013   ??? Altered mental status 08/28/2013        Date: 08/29/2013       Level of Religious/Spiritual Activity:  []          Involved in faith tradition/spiritual practice    []          Not involved in faith tradition/spiritual practice  []          Spiritually oriented    []          Claims no spiritual orientation    []          seeking spiritual identity  []          Feels alienated from religious practice/tradition  []          Feels angry about religious practice/tradition  [x]          Spirituality/religious tradition IS a resource for coping at this time.  []          Not able to assess due to medical condition    Services Provided Today:  []          crisis intervention    []          reading Scriptures  [x]          spiritual assessment    [x]          prayer  []          empathic listening/emotional support  []          rites and rituals (cite in comments)  []          life review     []          religious support  []          theological development   []          advocacy  []          ethical dialog     []          blessing  []          bereavement support    []          support to family  []          anticipatory grief support   []          help with AMD  []          spiritual guidance    []          meditation      Spiritual Care Needs  []           Emotional Support  []          Spiritual/Religious Care  []          Loss/Adjustment  []          Advocacy/Referral /Ethics  []   No needs expressed at this time  []          Other: (note in comments)  Spiritual Care Plan  []          Follow up visits with pt/family  []          Provide materials  []          Schedule sacraments  []          Contact [facility]  [x]          Follow up as needed  []          Other: (note in comments)     Comments: Spiritual Assessment     Advance Directives  No information.  Category/Code Status Full.  Family Support  Yes.  Spiritual Support  [facility].    Brett Becker     Visit by Arelia Sneddon, M.Ed., Th.B. ,Staff Chaplain

## 2013-08-29 NOTE — Other (Signed)
Interdisciplinary team rounds were held 08/29/2013 with the following team members:Nursing, Palliative Care, Pharmacy, Physician, Respiratory Therapy, Wound Care and Clinical Coordinator.    Plan of care discussed. See clinical pathway and/or care plan for interventions and desired outcomes.

## 2013-08-29 NOTE — Progress Notes (Signed)
Progress Note    Patient: Brett Becker MRN: [id number]  SSN: [id number]    Date of Birth: 1961-09-18  Age: 52 y.o.  Sex: male      Admit Date: 08/28/2013    LOS: 1 day     Subjective:     52 yo male with h/o chronic heavy etoh abuse presented 9/16 after experiencing a seizure.  Pt was having N/V for about 3 days prior to seizure and taking less PO (including etoh) during that period.    Pt currently sedated and was just given ativan.  He has received 3mg  of Haldol and 20mg  of Ativan in last 24 hours.    Review of Systems:  Unable to obtain ROS due to mental status.    Medications:  Current Facility-Administered Medications   Medication Dose Route Frequency   ??? [COMPLETED] lorazepam (ATIVAN) injection 1 mg  1 mg IntraVENous NOW   ??? [COMPLETED] sodium chloride 0.9 % bolus infusion 1,000 mL  1,000 mL IntraVENous ONCE   ??? [COMPLETED] 0.9% sodium chloride 1,000 mL with mvi, adult no.4 with vit K 3,300 unit- 150 mcg/10 mL 10 mL, thiamine 100 mg, folic acid 1 mg infusion   IntraVENous ONCE   ??? [COMPLETED] lorazepam (ATIVAN) injection 1 mg  1 mg IntraVENous NOW   ??? [COMPLETED] ceftriaxone (ROCEPHIN) 1 g in 0.9% sodium chloride (MBP/ADV) 50 mL MBP  1 g IntraVENous NOW   ??? [COMPLETED] levofloxacin (LEVAQUIN) 750 mg in D5W IVPB  750 mg IntraVENous NOW   ??? [COMPLETED] magnesium sulfate 4 g/100 ml IVPB  4 g IntraVENous ONCE   ??? [COMPLETED] lorazepam (ATIVAN) injection 1 mg  1 mg IntraVENous NOW   ??? sodium chloride (NS) flush 5-10 mL  5-10 mL IntraVENous Q8H   ??? sodium chloride (NS) flush 5-10 mL  5-10 mL IntraVENous PRN   ??? [EXPIRED] lorazepam (ATIVAN) injection 1 mg  1 mg IntraVENous Q15MIN PRN   ??? lorazepam (ATIVAN) injection 2 mg  2 mg IntraVENous Q1H PRN   ??? lorazepam (ATIVAN) injection 1 mg  1 mg IntraVENous Q1H PRN   ??? haloperidol lactate (HALDOL) injection 1 mg  1 mg IntraVENous Q2H PRN   ??? ondansetron (ZOFRAN) injection 4 mg  4 mg IntraVENous Q6H PRN   ??? [EXPIRED] sodium chloride 0.9 % bolus infusion 1,000 mL  1,000 mL  IntraVENous ONCE   ??? heparin (porcine) injection 5,000 Units  5,000 Units SubCUTAneous Q8H   ??? pneumococcal 23-valent (PNEUMOVAX 23) injection 0.5 mL  0.5 mL IntraMUSCular PRIOR TO DISCHARGE   ??? 0.9% sodium chloride 1,000 mL with mvi, adult no.4 with vit K 3,300 unit- 150 mcg/10 mL 10 mL, thiamine 100 mg, folic acid 1 mg infusion   IntraVENous DAILY   ??? 0.9% sodium chloride with KCl 20 mEq/L infusion   IntraVENous CONTINUOUS   ??? [COMPLETED] potassium chloride 20 mEq in 100 ml IVPB  20 mEq IntraVENous Q2H   ??? nicotine (NICODERM CQ) 21 mg/24 hr patch 1 patch  1 patch TransDERmal DAILY       Objective:     Filed Vitals:    08/29/13 0310 08/29/13 0400 08/29/13 0500 08/29/13 0600   BP: 153/86 126/81 142/96 139/85   Pulse: 70 78 99 80   Temp:       Resp: 17 22 27 22    Height:       Weight:       SpO2:  100% 85%         Physical Exam:  GENERAL: no distress, appears stated age  LUNG: clear to auscultation bilaterally  HEART: regular rate and rhythm, S1, S2 normal, no murmur, click, rub or gallop  ABDOMEN: soft, non-tender. Bowel sounds normal. No masses,  no organomegaly  EXTREMITIES:  extremities normal, atraumatic, no cyanosis or edema    Lab/Data Review:  Recent Results (from the past 24 hour(s))   CULTURE, BLOOD    Collection Time     08/28/13  8:30 AM       Result Value Range    Special Requests: RTARM      Culture result: NO GROWTH AFTER 21 HOURS     AMMONIA    Collection Time     08/28/13  8:43 AM       Result Value Range    Ammonia 44 (*) 11 - 32 UMOL/L   DRUG SCREEN, URINE    Collection Time     08/28/13 10:14 AM       Result Value Range    PCP(PHENCYCLIDINE) NEGATIVE       BENZODIAZEPINE NEGATIVE       COCAINE NEGATIVE       AMPHETAMINE NEGATIVE       METHADONE NEGATIVE       THC (TH-CANNABINOL) POSITIVE      OPIATES NEGATIVE       BARBITURATES NEGATIVE      URINE MICROSCOPIC    Collection Time     08/28/13 10:14 AM       Result Value Range    WBC 0-3  0 /hpf    RBC 10-20  0 /hpf    Epithelial cells 0-3  0 /hpf     Bacteria 0  0 /hpf    Casts 0-3  0 /lpf   MRSA SCREEN - PCR (NASAL)    Collection Time     08/28/13 12:00 PM       Result Value Range    Special Requests: NO SPECIAL REQUESTS      Culture result:        Value: MRSA target DNA is not detected (presumptive not colonized with MRSA.   EKG, 12 LEAD, SUBSEQUENT    Collection Time     08/28/13 12:12 PM       Result Value Range    Systolic BP        Diastolic BP        Ventricular Rate 90      Atrial Rate 90      P-R Interval 212      QRS Duration 104      Q-T Interval 404      QTC Calculation (Bezet) 494      Calculated P Axis 64      Calculated R Axis 74      Calculated T Axis 83      Diagnosis        Value: Sinus rhythm with 1st degree A-V block      Prolonged QT      Abnormal ECG      When compared with ECG of 28-Aug-2013 12:10,      PR interval has increased      Right bundle branch block is no longer Present      Confirmed by Neale Burly  MD ([facility]), NED D ([id number]) on 08/28/2013 2:54:56 PM   HIV-1 SCREEN    Collection Time     08/28/13  1:15 PM       Result Value Range    HIV-1 screen NONREACTIVE  NR     METABOLIC PANEL, BASIC    Collection Time     08/28/13  1:15 PM       Result Value Range    Sodium 123 (*) 136 - 145 mmol/L    Potassium 3.0 (*) 3.5 - 5.1 mmol/L    Chloride 88 (*) 98 - 107 mmol/L    CO2 24  21 - 32 mmol/L    Anion gap 11  7 - 16 mmol/L    Glucose 106 (*) 65 - 100 mg/dL    BUN 7  6 - 23 MG/DL    Creatinine 1.61 (*) 0.8 - 1.5 MG/DL    GFR est AA >09  >60 ml/min/1.21m2    GFR est non-AA >60  >60 ml/min/1.20m2    Calcium 7.6 (*) 8.3 - 10.4 MG/DL   MAGNESIUM    Collection Time     08/28/13  1:15 PM       Result Value Range    Magnesium 3.3 (*) 1.8 - 2.4 mg/dL   METABOLIC PANEL, BASIC    Collection Time     08/28/13  8:15 PM       Result Value Range    Sodium 126 (*) 136 - 145 mmol/L    Potassium 4.1  3.5 - 5.1 mmol/L    Chloride 94 (*) 98 - 107 mmol/L    CO2 20 (*) 21 - 32 mmol/L    Anion gap 12  7 - 16 mmol/L    Glucose 107 (*) 65 - 100 mg/dL    BUN 7  6 - 23  MG/DL    Creatinine 4.54 (*) 0.8 - 1.5 MG/DL    GFR est AA >09  >81 ml/min/1.37m2    GFR est non-AA >60  >60 ml/min/1.65m2    Calcium 7.8 (*) 8.3 - 10.4 MG/DL   MAGNESIUM    Collection Time     08/28/13  8:15 PM       Result Value Range    Magnesium 2.2  1.8 - 2.4 mg/dL   METABOLIC PANEL, BASIC    Collection Time     08/29/13  4:50 AM       Result Value Range    Sodium 130 (*) 136 - 145 mmol/L    Potassium 3.2 (*) 3.5 - 5.1 mmol/L    Chloride 96 (*) 98 - 107 mmol/L    CO2 21  21 - 32 mmol/L    Anion gap 13  7 - 16 mmol/L    Glucose 79  65 - 100 mg/dL    BUN 8  6 - 23 MG/DL    Creatinine 1.91 (*) 0.8 - 1.5 MG/DL    GFR est AA >47  >82 ml/min/1.48m2    GFR est non-AA >60  >60 ml/min/1.63m2    Calcium 7.8 (*) 8.3 - 10.4 MG/DL   MAGNESIUM    Collection Time     08/29/13  4:50 AM       Result Value Range    Magnesium 2.1  1.8 - 2.4 mg/dL     I have reviewed new clinical data.    Assessment:     Principal Problem:    Delirium tremens (08/28/2013)    Active Problems:    Macrocytosis (08/28/2013)      Hyponatremia (08/28/2013)      Hypomagnesemia (08/28/2013)      Transaminitis (08/28/2013)      Lactic acidosis (08/28/2013)      Alcohol abuse (08/28/2013)  Nausea and vomiting (08/28/2013)      Altered mental status (08/28/2013)      Plan:     Electrolytes improving.  On electrolyte replacement protocol.  Sodium improved with hydration.    Continue PRN ativan for DT's.    Will change Haldol to provide a range of dosing.    Check AM labs tomorrow and DC Q6H BMP/Mg.    Will likely need GI consult when through his withdrawal.  Add protonix.    DVT prophylaxis: SQ heparin  Disposition: Remain in ICU for severe etoh withdrawal and DT's    Signed By: Marthe Patch, DO     August 29, 2013

## 2013-08-29 NOTE — Progress Notes (Signed)
Had small stool in bed pan. Specimen sent to lab to check for occult blood, brownish black, in color,

## 2013-08-29 NOTE — Progress Notes (Signed)
Problem: Nutrition Deficit  Goal: *Optimize nutritional status  Nutrition:  Nutrition Consult for Electrolyte Management and Braden Scale referral for low nutrition score (2 - probably inadequate) within the Braden Scale score (15) reflective of a low risk to develop acute skin breakdown.  Assessment:  Anthropometrics:            Ht - 5'8", wgt - 55.2 kg (bed), BMI 18.5 (borderline underweight vs low end of wdl).  Macronutrient needs:            Estimated calorie needs - 1650-1925 cal/day (30-35 cal/kg/day)            Estimated protein needs - 72-83 gm pro/day (1.3-1.5 gm pro/kgIBW/day  Intake/Comparative Standards:            The patient is currently NPO which meets 0% of his calorie and 0% of his protein needs.   Pertinent Labs/Hemodynamic Parameters:              Potassium 3.2, sodium 130  Pertinent Drips/Medications:              IVF - 0.9% sodium chloride with 20 meq KCl/liter @ 100 ml/hr, IVF with thiamine, folic acid and MVI daily; Ativan, Nicoderm patch.  GI Function/Activity:              Active bowel sounds  Diet:              NPO  Food/Nutrition History:  52 year old gentleman with a h/o significant alcohol abuse admitted with alcohol withdrawal-related seizures due to recent N/V with decreased oral intake of food and alcohol. His skin is intact and he is being turned every 2 hours.    Diagnosis (Nutrition):         Altered electrolyte levels related to acute on chronic poor oral intake as evidenced by low serum sodium, potassium and magnesium on admission.     Intervention:   Meals and Snacks: NPO due to AMS.  Supplements: Nutrition Support Electrolyte Protocols are activated. Received an IV potassium bolus this AM.  Coordination of Nutrition Care: AM ICU/CCU rounds, collaboration with RN.Marland Kitchen    Monitoring/Evaluation:  Monitor clinical course, POC, labs and replace electrolytes based on protocol guidelines.  Bettey Mare. Daphine Deutscher RD,LD,CNSC  [phone]

## 2013-08-29 NOTE — Progress Notes (Signed)
Call to inform Dr. Marya Fossa of patient's positive hemocult for stool. States he will evaluate.

## 2013-08-29 NOTE — Progress Notes (Signed)
Report received. Chart reviewed. Vital signs acceptable. Attempting to doze. Awakens easily, calm and cooperative. Oriented X4.

## 2013-08-29 NOTE — Undefined (Signed)
Daughter called unit was updated on pt's status

## 2013-08-29 NOTE — Progress Notes (Signed)
Daughter called unit was updated on pt's status

## 2013-08-29 NOTE — Progress Notes (Signed)
Bedside and Verbal shift change report given to N. Godino, RN and (Cabin crew) by Dennie Bible, RN Physiological scientist). Report included the following information SBAR, ED Summary, Intake/Output, Recent Results and Cardiac Rhythm Normal Sinus.

## 2013-08-29 NOTE — Progress Notes (Signed)
Report to oncoming shift.

## 2013-08-30 LAB — METABOLIC PANEL, BASIC
Anion gap: 10 mmol/L (ref 7–16)
BUN: 8 MG/DL (ref 6–23)
CO2: 22 mmol/L (ref 21–32)
Calcium: 8.2 MG/DL — ABNORMAL LOW (ref 8.3–10.4)
Chloride: 101 mmol/L (ref 98–107)
Creatinine: 0.9 MG/DL (ref 0.8–1.5)
GFR est AA: >60 mL/min/{1.73_m2} (ref >60)
GFR est non-AA: >60 mL/min/{1.73_m2} (ref >60)
Glucose: 104 mg/dL — ABNORMAL HIGH (ref 65–100)
Potassium: 4 mmol/L (ref 3.5–5.1)
Sodium: 133 mmol/L — ABNORMAL LOW (ref 136–145)

## 2013-08-30 LAB — OCCULT BLOOD, STOOL: Occult blood, stool: POSITIVE — AB

## 2013-08-30 LAB — CBC WITH AUTOMATED DIFF
ABS. BASOPHILS: 0 10*3/uL (ref 0.0–0.2)
ABS. EOSINOPHILS: 0.2 10*3/uL (ref 0.0–0.8)
ABS. IMM. GRANS.: 0 10*3/uL (ref 0.0–0.5)
ABS. LYMPHOCYTES: 1.3 10*3/uL (ref 0.5–4.6)
ABS. MONOCYTES: 0.7 10*3/uL (ref 0.1–1.3)
ABS. NEUTROPHILS: 5.9 10*3/uL (ref 1.7–8.2)
BASOPHILS: 0 % (ref 0.0–2.0)
EOSINOPHILS: 2 % (ref 0.5–7.8)
HCT: 35.6 % — ABNORMAL LOW (ref 41.1–50.3)
HGB: 12.6 g/dL — ABNORMAL LOW (ref 13.6–17.2)
IMMATURE GRANULOCYTES: 0.1 % (ref 0.0–5.0)
LYMPHOCYTES: 16 % (ref 13–44)
MCH: 34.3 PG — ABNORMAL HIGH (ref 26.1–32.9)
MCHC: 35.4 g/dL — ABNORMAL HIGH (ref 31.4–35.0)
MCV: 97 FL (ref 79.6–97.8)
MONOCYTES: 9 % (ref 4.0–12.0)
MPV: 10.2 FL — ABNORMAL LOW (ref 10.8–14.1)
NEUTROPHILS: 73 % (ref 43–78)
PLATELET: 154 10*3/uL (ref 150–450)
RBC: 3.67 M/uL — ABNORMAL LOW (ref 4.23–5.67)
RDW: 12.5 % (ref 11.9–14.6)
WBC: 8.2 10*3/uL (ref 4.3–11.1)

## 2013-08-30 LAB — MAGNESIUM: Magnesium: 1.4 mg/dL — CL (ref 1.8–2.4)

## 2013-08-30 LAB — PHOSPHORUS: Phosphorus: 1.9 MG/DL — ABNORMAL LOW (ref 2.5–4.5)

## 2013-08-30 MED ADMIN — lorazepam (ATIVAN) injection 2 mg: INTRAVENOUS | @ 23:00:00 | NDC 00409677802

## 2013-08-30 MED ADMIN — lactated ringers infusion: INTRAVENOUS | @ 21:00:00 | NDC 11845118709

## 2013-08-30 MED ADMIN — haloperidol lactate (HALDOL) injection 1 mg: INTRAVENOUS | @ 06:00:00 | NDC 10147091101

## 2013-08-30 MED ADMIN — propofol (DIPRIVAN) 10 mg/mL injection: INTRAVENOUS | @ 21:00:00 | NDC 63323027025

## 2013-08-30 MED ADMIN — lorazepam (ATIVAN) injection 2 mg: INTRAVENOUS | @ 03:00:00 | NDC 00641604401

## 2013-08-30 MED ADMIN — chlordiazePOXIDE (LIBRIUM) capsule 10 mg: ORAL | @ 20:00:00 | NDC 51079037501

## 2013-08-30 MED ADMIN — 0.9% sodium chloride 1,000 mL with mvi, adult no.4 with vit K 3,300 unit- 150 mcg/10 mL 10 mL, thiamine 100 mg, folic acid 1 mg infusion: INTRAVENOUS | @ 14:00:00 | NDC 63323018410

## 2013-08-30 MED ADMIN — propofol (DIPRIVAN) 10 mg/mL injection: INTRAVENOUS | @ 21:00:00 | NDC 54569434000

## 2013-08-30 MED ADMIN — magnesium oxide (MAG-OX) tablet 400 mg: ORAL | @ 13:00:00 | NDC 96295013573

## 2013-08-30 MED ADMIN — chlordiazePOXIDE (LIBRIUM) capsule 10 mg: ORAL | @ 13:00:00 | NDC 51079037501

## 2013-08-30 MED ADMIN — lorazepam (ATIVAN) injection 2 mg: INTRAVENOUS | @ 08:00:00 | NDC 00641604401

## 2013-08-30 MED ADMIN — lactated ringers infusion: INTRAVENOUS | @ 20:00:00 | NDC 00409795309

## 2013-08-30 MED ADMIN — pantoprazole (PROTONIX) injection 40 mg: INTRAVENOUS | @ 13:00:00 | NDC 63323018610

## 2013-08-30 MED ADMIN — lactated ringers infusion: INTRAVENOUS | @ 21:00:00 | NDC 00409795309

## 2013-08-30 MED ADMIN — lorazepam (ATIVAN) injection 2 mg: INTRAVENOUS | @ 06:00:00 | NDC 00641604401

## 2013-08-30 MED ADMIN — lisinopril (PRINIVIL, ZESTRIL) tablet 40 mg: ORAL | @ 13:00:00 | NDC 68084019811

## 2013-08-30 MED ADMIN — 0.9% sodium chloride with KCl 20 mEq/L infusion: INTRAVENOUS | @ 11:00:00 | NDC 00409711509

## 2013-08-30 MED ADMIN — magnesium oxide (MAG-OX) tablet 400 mg: ORAL | @ 20:00:00 | NDC 96295013573

## 2013-08-30 MED ADMIN — sodium chloride (NS) flush 5-10 mL: INTRAVENOUS | @ 20:00:00 | NDC 87701099893

## 2013-08-30 MED ADMIN — magnesium oxide (MAG-OX) tablet 400 mg: ORAL | @ 22:00:00 | NDC 96295013573

## 2013-08-30 MED ADMIN — potassium chloride (K-DUR, KLOR-CON) SR tablet 40 mEq: ORAL | @ 13:00:00 | NDC 68084036011

## 2013-08-30 NOTE — Anesthesia Pre-Procedure Evaluation (Addendum)
Anesthetic History   No history of anesthetic complications           Review of Systems / Medical History  Patient summary reviewed and pertinent labs reviewed    Pulmonary  Within defined limits        Smoker       Neuro/Psych     seizures (Had witnessed seizure in ER 2 days ago that is thought to be due to EtOH withdrawl.)         Cardiovascular  Within defined limits              Exercise tolerance: >4 METS     GI/Hepatic/Renal       Hepatitis: type C        Comments: hematemesis   Endo/Other  Within defined limits           Other Findings   Comments: EtOH abuse  Hyponatremic             Physical Exam    Airway  Mallampati: II  TM Distance: 4 - 6 cm  Neck ROM: normal range of motion   Mouth opening: Normal     Cardiovascular  Regular rate and rhythm,  S1 and S2 normal,  no murmur, click, rub, or gallop  Rhythm: regular  Rate: normal         Dental    Dentition: Poor dentition     Pulmonary  Breath sounds clear to auscultation               Abdominal  GI exam deferred       Other Findings            Anesthetic Plan    ASA: 3  Anesthesia type: total IV anesthesia          Induction: Intravenous  Anesthetic plan and risks discussed with: Patient      Pt with h/o significant EtOH Abuse, newly diagnosed Hep C (Hep C Ab positive), Seizure in ER likely from EtOH withdrawal.  Being treated with Librium and Haldol on floor.  Undergoing EGD looking for varices vs. Mallory-Weiss tear vs. Neoplasm.  Pt appearing malnourished. Hyponatremic on admission.

## 2013-08-30 NOTE — Anesthesia Post-Procedure Evaluation (Signed)
Post-Anesthesia Evaluation and Assessment    Patient: Brett Becker MRN: [id number]  SSN: [id number]    Date of Birth: 1961-07-27  Age: 52 y.o.  Sex: male       Cardiovascular Function/Vital Signs  Visit Vitals   Item Reading   ??? BP 171/93   ??? Pulse 77   ??? Temp 36.8 ??C (98.2 ??F)   ??? Resp 21   ??? Ht 5\' 8"  (1.727 m)   ??? Wt 55.2 kg (121 lb 11.1 oz)   ??? BMI 18.51 kg/m2   ??? SpO2 97%       Patient is status post total IV anesthesia anesthesia for Procedure(s):  ESOPHAGOGASTRODUODENOSCOPY (EGD).    Nausea/Vomiting: None    Postoperative hydration reviewed and adequate.    Pain:  Pain Scale 1: Numeric (0 - 10) (08/30/13 1745)  Pain Intensity 1: 0 (08/30/13 1800)   Managed    Neurological Status:   Neuro  Neurologic State: Alert (08/30/13 1459)  Orientation Level: Oriented X4 (08/30/13 1459)  Cognition: Appropriate decision making (08/30/13 1459)  Speech: Clear (08/30/13 1459)  Assessment L Pupil: Brisk (08/30/13 1459)  Size L Pupil (mm): 3 (08/30/13 1459)  Assessment R Pupil: Brisk (08/30/13 1459)  Size R Pupil (mm): 3 (08/30/13 1459)  LUE Motor Response: Spontaneous  (08/30/13 1459)  LLE Motor Response: Spontaneous  (08/30/13 1459)  RUE Motor Response: Purposeful (08/30/13 1459)  RLE Motor Response: Spontaneous  (08/30/13 1459)   At baseline    Mental Status and Level of Consciousness: Alert and oriented     Pulmonary Status:   O2 Device: Room air (08/30/13 1800)   Adequate oxygenation and airway patent    Complications related to anesthesia: None    Post-anesthesia assessment completed. No concerns    Signed By: Arlis Porta, MD     August 30, 2013

## 2013-08-30 NOTE — Progress Notes (Signed)
TRANSFER - IN REPORT:    Verbal report received from Melody(name) on Brett Becker  being received from 628(unit) for ordered procedure  ,EGD    Report consisted of patient???s Situation, Background, Assessment and   Recommendations(SBAR).     Information from the following report(s) SBAR was reviewed with the receiving nurse.    Opportunity for questions and clarification was provided.      Assessment completed upon patient???s arrival to unit and care assumed.

## 2013-08-30 NOTE — Procedures (Signed)
BRIEF OPERATIVE NOTE    Date of Procedure: 08/30/2013   Preoperative Diagnosis: heme positive stool; anemia  Postoperative Diagnosis: gastric ulcers    Procedure(s):  ESOPHAGOGASTRODUODENOSCOPY (EGD)  Surgeon(s) and Role:     * Oiva Dibari A Cherisa Brucker, MD - Primary  Anesthesia: MAC   Estimated Blood Loss: minimal  Specimens: * No specimens in log *   Findings: Multiple large gastric ulcers - no active bleeding or high risk stigmata.  No biopsy or intervention. Non-obstructive Schatzki's ring.   Complications: No immediate complications.   Recommendations:   Continue bid PPI therapy.   Avoid gastric irritants.   Check H pylori Ab in am.   R/o use of NSAIDs, ASA.   Plan follow-up EGD in few weeks to assess for healing or biopsy if refractory.   More urgent re-evaluation for increased bleeding, other symptoms.  Dr Kizzie Furnish rounding on inpatients will follow.   Implants: * No implants in log *  Arabela Basaldua Myrtie Cruise, MD

## 2013-08-30 NOTE — H&P (View-Only) (Signed)
[facility] Consult Note       Referring Physician:  Dr Freelin    Consult Date:  08/30/2013    Admit Date:  08/28/2013    Chief Complaint:  Heme positive stool    Subjective:     History of Present Illness:  Patient is a 52 y.o. male without significant past medical history who is seen in consultation at the request of Dr. Freelin for heme positive stool and hematemesis.  The patient was admitted to ICU on 08/28/13 after feeling poorly over the past few days with decreased oral intake (including alcohol) and having a witnessed seizure in the ER. While in ICU, the patient had multiple Schwan-black stools that came to be hemoccult positive and the patient reported having an episode of hematemesis in the past, not recently. Hgb has dropped 2 grams since admission. LFTs elevated on admission. The patient denies any current nausea, vomiting, or abdominal pain.  He admits to occasional loose, black stools at home.  He admits to a 10-15 lb weight loss.  He has some reflux, but isn't able to describe to what frequency/ severity.  Takes NSAIDs occasionally for headaches. Denies family history of GI malignancy. No prior colonoscopy or EGD.    He states he drinks a 6 pack per day, but does admit that these are the "big beers". He also drinks liquor sometimes and smokes marijuana.  Denies other drug use. He denies any prior diagnosis of hepatitis C.  He has one tattoo, that he gave himself "just playing" with the equipment many years ago.  He denies any prior liver disease.    PMH:  History reviewed. No pertinent past medical history.    PSH:  Past Surgical History   Procedure Laterality Date   ??? Pr neurological procedure unlisted         Allergies:  No Known Allergies    Home Medications:  Prior to Admission medications    Medication Sig Start Date End Date Taking? Authorizing Provider   naproxen (NAPROSYN) 500 mg tablet Take 500 mg by mouth two (2) times daily (with meals).   Yes Phys Other, MD       Hospital  Medications:  Current Facility-Administered Medications   Medication Dose Route Frequency   ??? pantoprazole (PROTONIX) injection 40 mg  40 mg IntraVENous Q12H   ??? magnesium oxide (MAG-OX) tablet 400 mg  400 mg Oral BID   ??? chlordiazePOXIDE (LIBRIUM) capsule 10 mg  10 mg Oral TID   ??? lisinopril (PRINIVIL, ZESTRIL) tablet 40 mg  40 mg Oral DAILY   ??? NUTRITIONAL SUPPORT ELECTROLYTE PRN ORDERS   Does Not Apply PRN   ??? [COMPLETED] potassium chloride (K-DUR, KLOR-CON) SR tablet 40 mEq  40 mEq Oral BID   ??? sodium chloride (NS) flush 5-10 mL  5-10 mL IntraVENous Q8H   ??? sodium chloride (NS) flush 5-10 mL  5-10 mL IntraVENous PRN   ??? lorazepam (ATIVAN) injection 2 mg  2 mg IntraVENous Q1H PRN   ??? lorazepam (ATIVAN) injection 1 mg  1 mg IntraVENous Q1H PRN   ??? haloperidol lactate (HALDOL) injection 1 mg  1 mg IntraVENous Q2H PRN   ??? ondansetron (ZOFRAN) injection 4 mg  4 mg IntraVENous Q6H PRN   ??? pneumococcal 23-valent (PNEUMOVAX 23) injection 0.5 mL  0.5 mL IntraMUSCular PRIOR TO DISCHARGE   ??? 0.9% sodium chloride 1,000 mL with mvi, adult no.4 with vit K 3,300 unit- 150 mcg/10 mL 10 mL, thiamine 100 mg, folic acid   1 mg infusion   IntraVENous DAILY   ??? 0.9% sodium chloride with KCl 20 mEq/L infusion   IntraVENous CONTINUOUS   ??? nicotine (NICODERM CQ) 21 mg/24 hr patch 1 patch  1 patch TransDERmal DAILY       Social History:  History   Substance Use Topics   ??? Smoking status: Current Every Day Smoker -- 1.00 packs/day   ??? Smokeless tobacco: Not on file   ??? Alcohol Use: Yes     Pt denies any history of IV drug use or blood transfusions.  Does have one tattoo that he gave himself many years ago.    Family History:  History reviewed. No pertinent family history.    Review of Systems:  A detailed 10 system ROS is obtained, with pertinent positives as listed above.  All others are negative.    Diet:  Clear liquid    Objective:     Physical Exam:  Vitals:  BP 156/98   Pulse 82   Temp(Src) 98.6 ??F (37 ??C)   Resp 41   Ht 5' 8" (1.727  m)   Wt 55.2 kg (121 lb 11.1 oz)   BMI 18.51 kg/m2   SpO2 100%  Gen:  Pt is alert, cooperative, no acute distress  Skin:  Extremities and face reveal no rashes. No palmer erythema. No telangiectasias on the chest wall.  HEENT: Sclerae anicteric.  Extra-occular muscles are intact.  No oral ulcers.  No abnormal pigmentation of the lips.  The neck is supple.  Cardiovascular: Regular rate and rhythm. No murmurs, gallops, or rubs.  Respiratory:  Comfortable breathing with no accessory muscle use. Expiratory wheezing.  GI:  Abdomen nondistended, soft, and nontender.  Normal active bowel sounds. No enlargement of the liver or spleen. No masses palpable.  Rectal:  Deferred  Musculoskeletal:  No pitting edema of the lower legs.  Extremities have good range of motion.    Neurological:  Gross memory appears intact.  Patient is alert and oriented.  Psychiatric:  Mood appears appropriate with judgement intact.      Laboratory:    Recent Labs      08/30/13   0320  08/29/13   0450  08/28/13   2015  08/28/13   1315  08/28/13   0816   WBC  8.2   --    --    --   8.3   HGB  12.6*   --    --    --   14.6   HCT  35.6*   --    --    --   44.0   PLT  154   --    --    --   155   MCV  97.0   --    --    --   105.3*   NA  133*  130*  126*  123*  117*   K  4.0  3.2*  4.1  3.0*  3.5   CL  101  96*  94*  88*  80*   CO2  22  21  20*  24  20*   BUN  8  8  7  7  6   CREA  0.90  0.62*  0.70*  0.70*  0.89   CA  8.2*  7.8*  7.8*  7.6*  8.3   MG  1.4*  2.1  2.2  3.3*  1.5*   GLU  104*  79  107*    106*  141*   AP   --    --    --    --   79   SGOT   --    --    --    --   177*   ALT   --    --    --    --   98*   TBILI   --    --    --    --   1.2*   ALB   --    --    --    --   3.1*   TP   --    --    --    --   7.6   PTP   --    --    --    --   11.5   INR   --    --    --    --   1.1   APTT   --    --    --    --   25.8          Assessment:       Principal Problem:    Delirium tremens (08/28/2013)    Active Problems:    Macrocytosis (08/28/2013)       Hyponatremia (08/28/2013)      Hypomagnesemia (08/28/2013)      Transaminitis (08/28/2013)      Lactic acidosis (08/28/2013)      Alcohol abuse (08/28/2013)      Nausea and vomiting (08/28/2013)      Altered mental status (08/28/2013)      Occult blood positive stool (08/30/2013)      HTN (hypertension) (08/30/2013)    52 year oldmale patient with alcohol and tobacco abuse admitted on 08/28/13 after seizures likely related to alcohol withdrawal seen in consultation for heme positive stool and also 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  Possible etiologies for heme positive stools in the patient could include esophageal varices (although no known history of cirrhosis), erosive esophagitis/ gastritis, AVM, Mallory- Wies tear, or less likely neoplasm.     Plan:       - Check RUQ with doppler tomorrow given elevated LFTs, hep C +, and decreased oral intake prior to admission.  - Recommend EGD today for further evaluation.  The risks (bleeding, infection, perforation, exacerbation of underlying medical problems, or complications from anesthesia) have been explained to the patient and he wishes to proceed.  - Consider further evaluation with colonoscopy (he is due for colon cancer screening, as well).     Ronnie Mallette Ledford Lynix Bonine, NP-c     Patient is seen and examined in collaboration with Dr. Palma.  Assessment and plan as per Dr. Palma.    ATTENDING NOTE:  I have seen the patient and agree with the above assessment and plan. Patient has suspected acute blood loss anemia with report of recent hematemesis. Plan for EGD today to evaluate for an upper GI source of bleeding.    David Palma, MD

## 2013-08-30 NOTE — Progress Notes (Signed)
TRANSFER - IN REPORT:    Verbal report received from natalie rn(name) on Shakil Dirk  being received from icu(unit) for routine progression of care      Report consisted of patient???s Situation, Background, Assessment and   Recommendations(SBAR).     Information from the following report(s) SBAR was reviewed with the receiving nurse.    Opportunity for questions and clarification was provided.      Assessment completed upon patient???s arrival to unit and care assumed.

## 2013-08-30 NOTE — Progress Notes (Signed)
Patient with two large black bowel movements. Pulled out foley catheter bright red blood from penis. Paged hospitalist to inform

## 2013-08-30 NOTE — Progress Notes (Addendum)
Patient transferred to room 628. Denies pain or nausea. No signs of DTs. Posey bed alarm and tab alert on patient as he will attempt oob without assistance per icu nurse. Dual skin assessment completed with sybil wardlaw rn skin scattered scabs on arms.

## 2013-08-30 NOTE — Progress Notes (Signed)
[facility] OUTREACH NURSE PROGRESS REPORT      SUBJECTIVE: Called to assess patient secondary to transfer from ICU.      MEWS Score: 1 (08/30/13 1511)  Filed Vitals:    08/30/13 1618 08/30/13 1731 08/30/13 1745 08/30/13 1800   BP: 162/86 156/97 152/97 171/93   Pulse: 79 78 78 77   Temp:       Resp: 18 16 23 21    Height:       Weight:       SpO2: 98% 98% 94% 97%    LAB DATA:    Recent Labs      08/30/13   0320  08/29/13   0450  08/28/13   2015   08/28/13   0816   NA  133*  130*  126*   < >  117*   K  4.0  3.2*  4.1   < >  3.5   CL  101  96*  94*   < >  80*   CO2  22  21  20*   < >  20*   AGAP  10  13  12    < >  17*   GLU  104*  79  107*   < >  141*   BUN  8  8  7    < >  6   CREA  0.90  0.62*  0.70*   < >  0.89   GFRAA  >60  >60  >60   < >  >60   GFRNA  >60  >60  >60   < >  >60   CA  8.2*  7.8*  7.8*   < >  8.3   MG  1.4*  2.1  2.2   < >  1.5*   PHOS  1.9*   --    --    --    --    ALB   --    --    --    --   3.1*   TP   --    --    --    --   7.6   GLOB   --    --    --    --   4.5*   AGRAT   --    --    --    --   0.7*   ALT   --    --    --    --   98*    < > = values in this interval not displayed.        Recent Labs      08/30/13   0320  08/28/13   0816   WBC  8.2  8.3   HGB  12.6*  14.6   HCT  35.6*  44.0   PLT  154  155          OBJECTIVE: On arrival to room, I found patient to be awake and asking to be cut out of his restreaints.     Pain Assessment  Pain Intensity 1: 0 (08/30/13 1800)        Patient Stated Pain Goal: 0    ASSESSMENT:  Pt awake and alert s/p EGD for GI bleed.  Gastric ulcers found but no active bleeding at this time.  Pt denies pain or SOB.  Wanting a knife to cut his restraints off to go home.  CC at pt bedside as well as pt is restrained on both wrists.  Spo2 95% on RA.  Lungs clear and equal bilat.  Resp even and unlabored.  No obvious distress.    PLAN:  Will continue to follow per protocol.

## 2013-08-30 NOTE — Progress Notes (Signed)
Bedside and Verbal shift change report given to Gean Maidens, Charity fundraiser (Cabin crew) by Dennie Bible, RN(offgoing nurse). Report included the following information SBAR.

## 2013-08-30 NOTE — Progress Notes (Signed)
TRANSFER - OUT REPORT:    Verbal report given to vangie rn(name) on Brett Becker  being transferred to gi lab(unit) for ordered procedure       Report consisted of patient???s Situation, Background, Assessment and   Recommendations(SBAR).     Information from the following report(s) SBAR was reviewed with the receiving nurse.    Opportunity for questions and clarification was provided.

## 2013-08-30 NOTE — Progress Notes (Signed)
TRANSFER - OUT REPORT:    Verbal report given to Melody, RN on [employer]  being transferred to 6 floor for routine progression of care       Report consisted of patient???s Situation, Background, Assessment and   Recommendations(SBAR).     Information from the following report(s) SBAR, Intake/Output and Recent Results was reviewed with the receiving nurse.    Opportunity for questions and clarification was provided.      Patient transported with:   [employer]

## 2013-08-30 NOTE — Consults (Addendum)
[facility] Consult Note       Referring Physician:  Dr Arlester Marker    Consult Date:  08/30/2013    Admit Date:  08/28/2013    Chief Complaint:  Heme positive stool    Subjective:     History of Present Illness:  Patient is a 52 y.o. male without significant past medical history who is seen in consultation at the request of Dr. Arlester Marker for heme positive stool and hematemesis.  The patient was admitted to ICU on 08/28/13 after feeling poorly over the past few days with decreased oral intake (including alcohol) and having a witnessed seizure in the ER. While in ICU, the patient had multiple Branum-black stools that came to be hemoccult positive and the patient reported having an episode of hematemesis in the past, not recently. Hgb has dropped 2 grams since admission. LFTs elevated on admission. The patient denies any current nausea, vomiting, or abdominal pain.  He admits to occasional loose, black stools at home.  He admits to a 10-15 lb weight loss.  He has some reflux, but isn't able to describe to what frequency/ severity.  Takes NSAIDs occasionally for headaches. Denies family history of GI malignancy. No prior colonoscopy or EGD.    He states he drinks a 6 pack per day, but does admit that these are the "big beers". He also drinks liquor sometimes and smokes marijuana.  Denies other drug use. He denies any prior diagnosis of hepatitis C.  He has one tattoo, that he gave himself "just playing" with the equipment many years ago.  He denies any prior liver disease.    PMH:  History reviewed. No pertinent past medical history.    PSH:  Past Surgical History   Procedure Laterality Date   ??? Pr neurological procedure unlisted         Allergies:  No Known Allergies    Home Medications:  Prior to Admission medications    Medication Sig Start Date End Date Taking? Authorizing Provider   naproxen (NAPROSYN) 500 mg tablet Take 500 mg by mouth two (2) times daily (with meals).   Yes Phys Other, MD       Hospital  Medications:  Current Facility-Administered Medications   Medication Dose Route Frequency   ??? pantoprazole (PROTONIX) injection 40 mg  40 mg IntraVENous Q12H   ??? magnesium oxide (MAG-OX) tablet 400 mg  400 mg Oral BID   ??? chlordiazePOXIDE (LIBRIUM) capsule 10 mg  10 mg Oral TID   ??? lisinopril (PRINIVIL, ZESTRIL) tablet 40 mg  40 mg Oral DAILY   ??? NUTRITIONAL SUPPORT ELECTROLYTE PRN ORDERS   Does Not Apply PRN   ??? [COMPLETED] potassium chloride (K-DUR, KLOR-CON) SR tablet 40 mEq  40 mEq Oral BID   ??? sodium chloride (NS) flush 5-10 mL  5-10 mL IntraVENous Q8H   ??? sodium chloride (NS) flush 5-10 mL  5-10 mL IntraVENous PRN   ??? lorazepam (ATIVAN) injection 2 mg  2 mg IntraVENous Q1H PRN   ??? lorazepam (ATIVAN) injection 1 mg  1 mg IntraVENous Q1H PRN   ??? haloperidol lactate (HALDOL) injection 1 mg  1 mg IntraVENous Q2H PRN   ??? ondansetron (ZOFRAN) injection 4 mg  4 mg IntraVENous Q6H PRN   ??? pneumococcal 23-valent (PNEUMOVAX 23) injection 0.5 mL  0.5 mL IntraMUSCular PRIOR TO DISCHARGE   ??? 0.9% sodium chloride 1,000 mL with mvi, adult no.4 with vit K 3,300 unit- 150 mcg/10 mL 10 mL, thiamine 100 mg, folic acid  1 mg infusion   IntraVENous DAILY   ??? 0.9% sodium chloride with KCl 20 mEq/L infusion   IntraVENous CONTINUOUS   ??? nicotine (NICODERM CQ) 21 mg/24 hr patch 1 patch  1 patch TransDERmal DAILY       Social History:  History   Substance Use Topics   ??? Smoking status: Current Every Day Smoker -- 1.00 packs/day   ??? Smokeless tobacco: Not on file   ??? Alcohol Use: Yes     Pt denies any history of IV drug use or blood transfusions.  Does have one tattoo that he gave himself many years ago.    Family History:  History reviewed. No pertinent family history.    Review of Systems:  A detailed 10 system ROS is obtained, with pertinent positives as listed above.  All others are negative.    Diet:  Clear liquid    Objective:     Physical Exam:  Vitals:  BP 156/98   Pulse 82   Temp(Src) 98.6 ??F (37 ??C)   Resp 41   Ht 5\' 8"  (1.727  m)   Wt 55.2 kg (121 lb 11.1 oz)   BMI 18.51 kg/m2   SpO2 100%  Gen:  Pt is alert, cooperative, no acute distress  Skin:  Extremities and face reveal no rashes. No palmer erythema. No telangiectasias on the chest wall.  HEENT: Sclerae anicteric.  Extra-occular muscles are intact.  No oral ulcers.  No abnormal pigmentation of the lips.  The neck is supple.  Cardiovascular: Regular rate and rhythm. No murmurs, gallops, or rubs.  Respiratory:  Comfortable breathing with no accessory muscle use. Expiratory wheezing.  GI:  Abdomen nondistended, soft, and nontender.  Normal active bowel sounds. No enlargement of the liver or spleen. No masses palpable.  Rectal:  Deferred  Musculoskeletal:  No pitting edema of the lower legs.  Extremities have good range of motion.    Neurological:  Gross memory appears intact.  Patient is alert and oriented.  Psychiatric:  Mood appears appropriate with judgement intact.      Laboratory:    Recent Labs      08/30/13   0320  08/29/13   0450  08/28/13   2015  08/28/13   1315  08/28/13   0816   WBC  8.2   --    --    --   8.3   HGB  12.6*   --    --    --   14.6   HCT  35.6*   --    --    --   44.0   PLT  154   --    --    --   155   MCV  97.0   --    --    --   105.3*   NA  133*  130*  126*  123*  117*   K  4.0  3.2*  4.1  3.0*  3.5   CL  101  96*  94*  88*  80*   CO2  22  21  20*  24  20*   BUN  8  8  7  7  6    CREA  0.90  0.62*  0.70*  0.70*  0.89   CA  8.2*  7.8*  7.8*  7.6*  8.3   MG  1.4*  2.1  2.2  3.3*  1.5*   GLU  104*  79  107*  106*  141*   AP   --    --    --    --   79   SGOT   --    --    --    --   177*   ALT   --    --    --    --   98*   TBILI   --    --    --    --   1.2*   ALB   --    --    --    --   3.1*   TP   --    --    --    --   7.6   PTP   --    --    --    --   11.5   INR   --    --    --    --   1.1   APTT   --    --    --    --   25.8          Assessment:       Principal Problem:    Delirium tremens (08/28/2013)    Active Problems:    Macrocytosis (08/28/2013)       Hyponatremia (08/28/2013)      Hypomagnesemia (08/28/2013)      Transaminitis (08/28/2013)      Lactic acidosis (08/28/2013)      Alcohol abuse (08/28/2013)      Nausea and vomiting (08/28/2013)      Altered mental status (08/28/2013)      Occult blood positive stool (08/30/2013)      HTN (hypertension) (08/30/2013)    52 year oldmale patient with alcohol and tobacco abuse admitted on 08/28/13 after seizures likely related to alcohol withdrawal seen in consultation for heme positive stool and also 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  Possible etiologies for heme positive stools in the patient could include esophageal varices (although no known history of cirrhosis), erosive esophagitis/ gastritis, AVM, Mallory- Wies tear, or less likely neoplasm.     Plan:       - Check RUQ with doppler tomorrow given elevated LFTs, hep C +, and decreased oral intake prior to admission.  - Recommend EGD today for further evaluation.  The risks (bleeding, infection, perforation, exacerbation of underlying medical problems, or complications from anesthesia) have been explained to the patient and he wishes to proceed.  - Consider further evaluation with colonoscopy (he is due for colon cancer screening, as well).     Brittani Bing Neighbors, NP-c     Patient is seen and examined in collaboration with Dr. Kizzie Furnish.  Assessment and plan as per Dr. Kizzie Furnish.    ATTENDING NOTE:  I have seen the patient and agree with the above assessment and plan. Patient has suspected acute blood loss anemia with report of recent hematemesis. Plan for EGD today to evaluate for an upper GI source of bleeding.    Ernestine Conrad, MD

## 2013-08-30 NOTE — Progress Notes (Signed)
TRANSFER - IN REPORT:    Verbal report received from linda rn(name) on Brett Becker  being received from  Gi lab(unit) for ordered procedure      Report consisted of patient???s Situation, Background, Assessment and   Recommendations(SBAR).     Information from the following report(s) SBAR was reviewed with the receiving nurse.    Opportunity for questions and clarification was provided.      Assessment completed upon patient???s arrival to unit and care assumed.

## 2013-08-30 NOTE — Progress Notes (Signed)
Transported to room 636 by linda hines, rn and vangie yau, rn.  Handed off to melody crain, rn

## 2013-08-30 NOTE — Progress Notes (Signed)
Patient very agitated upon return from gi lab. Pulling at iv lines, foley. Attempting oob. Ativan 2 mg iv. Without resolve.soft wrist restraints ordered for patient safety and sitter. Posey bed alarm and tab alert already on patient.   Pulling out of soft wrist restraints.

## 2013-08-30 NOTE — Progress Notes (Signed)
Interdisciplinary team rounds were held 08/30/2013 with the following team members:Nursing, Nurse Practitioner, Palliative Care, Respiratory Therapy, Wound Care and Clinical Coordinator.     Plan of care discussed. See clinical pathway and/or care plan for interventions and desired outcomes.

## 2013-08-30 NOTE — Progress Notes (Signed)
ICU Rover: Patient still in EGD; will assess when pt gets to room

## 2013-08-30 NOTE — Progress Notes (Signed)
Cannot sleep. Ativan 2 mg IV given.

## 2013-08-30 NOTE — Progress Notes (Signed)
Hospitalist Progress Note    Subjective:   Daily Progress Note: 08/30/2013 7:52 AM    Brett Becker is a 52 yo WM who has a history of alcohol abuse admitted 9/16 after having a seizure outside of hte hospital that occurred again in the ED.  No pmh of seizures.  He had been having nausea and vomiting for several days prior to admission and had decreased oral intake, including alcohol.  He was admitted to the ED for treatment of DTs.  Over the past 24 hours he has required prn haldol x 2 and prn ativan 2 mg doses x 5.  He is currently arousable and oriented for me with no signs of tremors nor agitation.  He thinks he was admitted for his back pain and doesn't recall his seizures.  He endorses drinking at least 6 beer each day and sometimes he has liquor also.  He is occult blood positive and states he has vomited up blood before but it has been a few month since the last episode that he can recall.    No acute events overnight.      Current Facility-Administered Medications   Medication Dose Route Frequency   ??? pantoprazole (PROTONIX) injection 40 mg  40 mg IntraVENous Q12H   ??? magnesium oxide (MAG-OX) tablet 400 mg  400 mg Oral BID   ??? chlordiazePOXIDE (LIBRIUM) capsule 10 mg  10 mg Oral TID   ??? lisinopril (PRINIVIL, ZESTRIL) tablet 40 mg  40 mg Oral DAILY   ??? NUTRITIONAL SUPPORT ELECTROLYTE PRN ORDERS   Does Not Apply PRN   ??? potassium chloride (K-DUR, KLOR-CON) SR tablet 40 mEq  40 mEq Oral BID   ??? sodium chloride (NS) flush 5-10 mL  5-10 mL IntraVENous Q8H   ??? sodium chloride (NS) flush 5-10 mL  5-10 mL IntraVENous PRN   ??? lorazepam (ATIVAN) injection 2 mg  2 mg IntraVENous Q1H PRN   ??? lorazepam (ATIVAN) injection 1 mg  1 mg IntraVENous Q1H PRN   ??? haloperidol lactate (HALDOL) injection 1 mg  1 mg IntraVENous Q2H PRN   ??? ondansetron (ZOFRAN) injection 4 mg  4 mg IntraVENous Q6H PRN   ??? pneumococcal 23-valent (PNEUMOVAX 23) injection 0.5 mL  0.5 mL IntraMUSCular PRIOR TO DISCHARGE   ??? 0.9% sodium chloride 1,000 mL  with mvi, adult no.4 with vit K 3,300 unit- 150 mcg/10 mL 10 mL, thiamine 100 mg, folic acid 1 mg infusion   IntraVENous DAILY   ??? 0.9% sodium chloride with KCl 20 mEq/L infusion   IntraVENous CONTINUOUS   ??? nicotine (NICODERM CQ) 21 mg/24 hr patch 1 patch  1 patch TransDERmal DAILY        Review of Systems  Patient endorses back pain and past episodes of hematemesis.  He denies fever, chills, chest pain, sob, cough, dysphagia, sore throat, abdominal pain, constipation, diarrhea, hematuria, flushing, confusion.    Objective:     BP 151/91   Pulse 115   Temp(Src) 98.6 ??F (37 ??C)   Resp 41   Ht 5\' 8"  (1.727 m)   Wt 55.2 kg (121 lb 11.1 oz)   BMI 18.51 kg/m2   SpO2 100% O2 Flow Rate (L/min): 6 l/min (decreased to 3L) O2 Device: Room air    Temp (24hrs), Avg:98.6 ??F (37 ??C), Min:98.2 ??F (36.8 ??C), Max:99.1 ??F (37.3 ??C)         09/16 1900 - 09/18 1914  In: 3709 [P.O.:720; I.V.:2989]  Out: 3000 [Urine:3000]  PHYSICAL EXAM:    General:  Awake, arousable, oriented to person, time and place, no acute distress, thin  Eyes:  Non icteric, EOMI  Mouth:  Moist, no thrush nor exudates  Neck:  Supple  PULM: non labored, no accessory muscle use, CTAB  CV:  RRR  ABD:  Soft, no rebound nor guarding, not distended, active bowel sounds  Skin/ext:  Lower ext with no edema and are symmetrical, warm, dry    Additional comments: All labs, notes and studies from the past 24 hours have been personally reviewed today by me.    Data Review    Recent Results (from the past 24 hour(s))   OCCULT BLOOD, STOOL    Collection Time     08/29/13  9:45 PM       Result Value Range    Occult blood, stool POSITIVE (*) NEG     METABOLIC PANEL, BASIC    Collection Time     08/30/13  3:20 AM       Result Value Range    Sodium 133 (*) 136 - 145 mmol/L    Potassium 4.0  3.5 - 5.1 mmol/L    Chloride 101  98 - 107 mmol/L    CO2 22  21 - 32 mmol/L    Anion gap 10  7 - 16 mmol/L    Glucose 104 (*) 65 - 100 mg/dL    BUN 8  6 - 23 MG/DL    Creatinine 1.61  0.8 - 1.5  MG/DL    GFR est AA >09  >60 ml/min/1.103m2    GFR est non-AA >60  >60 ml/min/1.44m2    Calcium 8.2 (*) 8.3 - 10.4 MG/DL   MAGNESIUM    Collection Time     08/30/13  3:20 AM       Result Value Range    Magnesium 1.4 (*) 1.8 - 2.4 mg/dL   CBC WITH AUTOMATED DIFF    Collection Time     08/30/13  3:20 AM       Result Value Range    WBC 8.2  4.3 - 11.1 K/uL    RBC 3.67 (*) 4.23 - 5.67 M/uL    HGB 12.6 (*) 13.6 - 17.2 g/dL    HCT 45.4 (*) 09.8 - 50.3 %    MCV 97.0  79.6 - 97.8 FL    MCH 34.3 (*) 26.1 - 32.9 PG    MCHC 35.4 (*) 31.4 - 35.0 g/dL    RDW 11.9  14.7 - 82.9 %    PLATELET 154  150 - 450 K/uL    MPV 10.2 (*) 10.8 - 14.1 FL    DF AUTOMATED      NEUTROPHILS 73  43 - 78 %    LYMPHOCYTES 16  13 - 44 %    MONOCYTES 9  4.0 - 12.0 %    EOSINOPHILS 2  0.5 - 7.8 %    BASOPHILS 0  0.0 - 2.0 %    IMMATURE GRANULOCYTES 0.1  0.0 - 5.0 %    ABS. NEUTROPHILS 5.9  1.7 - 8.2 K/UL    ABS. LYMPHOCYTES 1.3  0.5 - 4.6 K/UL    ABS. MONOCYTES 0.7  0.1 - 1.3 K/UL    ABS. EOSINOPHILS 0.2  0.0 - 0.8 K/UL    ABS. BASOPHILS 0.0  0.0 - 0.2 K/UL    ABS. IMM. GRANS. 0.0  0.0 - 0.5 K/UL         Assessment/Plan:  Principal Problem:    Delirium tremens (08/28/2013)    Active Problems:    Macrocytosis (08/28/2013)      Hyponatremia (08/28/2013)      Hypomagnesemia (08/28/2013)      Transaminitis (08/28/2013)      Lactic acidosis (08/28/2013)      Alcohol abuse (08/28/2013)      Nausea and vomiting (08/28/2013)      Altered mental status (08/28/2013)      Occult blood positive stool (08/30/2013)      HTN (hypertension) (08/30/2013)    Plan:    DTs:  Now AAO x 3.  Will schedule librium in hopes of decreasing amount of PRN haldol/ativan he requires.  Per RN, easily reoriented when confused.  On electrolyte replacement- will check phos to moniter for refeeding syndrome.  Receiving banana bag/MVI.    Seizure 2/2 EtOH withdrawal:  No further seizure activity.  No underlying epileptic process, due to alcohol withdrawal.    Occult blood positive with history of  hemoptysis:  Hg stable.  Possible Mallory Weiss tears.  Will need EGD.  Will d/c sqh and place scds.    HTN:  Will initiate lisinopril 40mg .    Hyponatremia:  Improved to 133, was 126 on admission.    Medically stable for transfer to floor bed.      Care Plan discussed with: The patient, his care team, RN    Total time spent with patient: 25 minutes.

## 2013-08-30 NOTE — Progress Notes (Signed)
Bed alarm ringing, patient found trying to get out of bed to commode. Assisted to commode, but is very weak standing. Had moderate amount of brownish black soft stool. Back to bed, complete bed bath with linen change done.Vital signs acceptable. Placed call bell within reach and bed pan. stressed importance of not attempting to get out of bed for safety reasons.

## 2013-08-30 NOTE — Interval H&P Note (Signed)
H&P Update:  Brett Becker was seen and examined.  History and physical has been reviewed. There have been no significant clinical changes since the completion of the originally dated History and Physical.    Signed By: Marijo File, MD     August 30, 2013 5:20 PM

## 2013-08-30 NOTE — Op Note (Signed)
[facility]                            [address]                           [place], [place]. [address]                                [phone]                                OPERATIVE REPORT    NAME:  Ralpheal, Zappone                               MR:  [id number]  LOC:                        SEX:  Gillermina Hu:  [id number]  DOB:  04/17/1961            AGE:  52              PT:  I  ADMIT:  08/28/2013          DSCH:  08/28/2013     MSV:        DATE OF PROCEDURE: 08/30/2013    REFERRING PHYSICIAN: Marthe Patch, DO    PREOPERATIVE DIAGNOSES  1. Heme-positive stools and anemia.  2. Alcohol abuse with history of hepatitis C.    NAME OF PROCEDURE: Esophagogastroduodenoscopy (diagnostic).    SURGEON: Pasty Manninen A. Alicia Seib, MD    ANESTHESIA: The patient received monitored anesthetic care, to include  Diprivan. Please see anesthesiology records for details.    INSTRUMENT USED: [id number].    PROCEDURE NOTE: After informed consent, stabilization and adequate   fasting patient was brought to the GI lab and then to the endoscopy suite.  He was then placed in the usual standard left lateral position. He  received monitored anesthetic care and remained comfortable throughout  the procedure. After adequate sedation had been obtained, an Olympus  video endoscope was advanced under direct vision. Normal mucosa seen in  the pharynx, larynx and esophagus. No esophageal varices were identified.  There was a nonobstructive Schatzki ring at the EG junction, through  which the endoscope passed without difficulty. There was mild  bile-stained fluid present in the stomach that was aspirated. The fundus  and body of the stomach were mostly unremarkable except for mild erythema  and nodularity. The angularis showed a large, approximately 2 cm ulcer  with clean white base and heaped up edges. There was no high risk  stigmata of bleeding. Additionally, in the antrum and prepyloric area,   there were multiple (>5), 6-10 mm sized ulcers with clean white base.  Some smaller erosions were also present. Pylorus was easily identified  and intubated. Duodenal bulb and descending duodenum appeared normal with  normal bilious fluid being seen. The endoscope was then withdrawn back  to the stomach which was insufflated with air for optimal visualization of gastric  mucosa. Retroflex view of the cardia did not show definite hernial sac.  The fundus and body of the stomach  showed mild mucosal granularity as  described. No biopsies were performed secondary to significant recent  bleed. The endoscope was then slowly withdrawn. The patient tolerated the  procedure well.    IMPRESSION  1. Multiple large gastric ulcers, none actively bleeding or with high  risk stigmata.  2. Nonobstructive Schatzki ring.  3. Otherwise unremarkable upper endoscopy.    RECOMMENDATIONS  1. Routine post-endoscopy instructions.  2. Continue b.i.d. PPI therapy.  3. Clear liquid diet for today.  4. Avoid gastric irritants, and rule out use of NSAIDs, which is  suspected based on the endoscopic appearance.  5. Check H. pylori antibody.  6. Dr. Kizzie Furnish rounding on inpatients, will follow.  7. Consider followup upper endoscopy in a few weeks to biopsy ulcers   or to assess for healing; more urgently if he has evidence of increased  bleeding or other symptoms.              Marijo File, MD        A                This is an unverified document unless signed by physician.    TID:  [id number]                                      DT:  08/30/2013 06:36 P  JOB:  [id number]           DOC#:  [id number]           DD:  08/30/2013    cc:   Marijo File, MD

## 2013-08-30 NOTE — Progress Notes (Signed)
TRANSFER - OUT REPORT:    Verbal report given to Melody Crain, rn(name) on Brett Becker  being transferred to 636(unit) for routine post - op       Report consisted of patient???s Situation, Background, Assessment and   Recommendations(SBAR).     Information from the following report(s) SBAR was reviewed with the receiving nurse.    Opportunity for questions and clarification was provided.

## 2013-08-30 NOTE — Progress Notes (Signed)
Pt is orientated x4 but at times tries to get out of bed saying he has to use restroom. Foley is in and draining and pthas been instructed back to bed multiple times. VS acceptable, call light within reach

## 2013-08-31 LAB — CBC WITH AUTOMATED DIFF
ABS. BASOPHILS: 0 10*3/uL (ref 0.0–0.2)
ABS. EOSINOPHILS: 0.2 10*3/uL (ref 0.0–0.8)
ABS. IMM. GRANS.: 0 10*3/uL (ref 0.0–0.5)
ABS. LYMPHOCYTES: 1.2 10*3/uL (ref 0.5–4.6)
ABS. MONOCYTES: 0.9 10*3/uL (ref 0.1–1.3)
ABS. NEUTROPHILS: 4.7 10*3/uL (ref 1.7–8.2)
BASOPHILS: 0 % (ref 0.0–2.0)
EOSINOPHILS: 2 % (ref 0.5–7.8)
HCT: 35.1 % — ABNORMAL LOW (ref 41.1–50.3)
HGB: 12.6 g/dL — ABNORMAL LOW (ref 13.6–17.2)
IMMATURE GRANULOCYTES: 0.3 % (ref 0.0–5.0)
LYMPHOCYTES: 16 % (ref 13–44)
MCH: 34.1 PG — ABNORMAL HIGH (ref 26.1–32.9)
MCHC: 35.9 g/dL — ABNORMAL HIGH (ref 31.4–35.0)
MCV: 94.9 FL (ref 79.6–97.8)
MONOCYTES: 13 % — ABNORMAL HIGH (ref 4.0–12.0)
MPV: 10 FL — ABNORMAL LOW (ref 10.8–14.1)
NEUTROPHILS: 69 % (ref 43–78)
PLATELET: 193 10*3/uL (ref 150–450)
RBC: 3.7 M/uL — ABNORMAL LOW (ref 4.23–5.67)
RDW: 12.3 % (ref 11.9–14.6)
WBC: 7 10*3/uL (ref 4.3–11.1)

## 2013-08-31 LAB — METABOLIC PANEL, BASIC
Anion gap: 14 mmol/L (ref 7–16)
BUN: 6 MG/DL (ref 6–23)
CO2: 17 mmol/L — ABNORMAL LOW (ref 21–32)
Calcium: 8.6 MG/DL (ref 8.3–10.4)
Chloride: 100 mmol/L (ref 98–107)
Creatinine: 0.72 MG/DL — ABNORMAL LOW (ref 0.8–1.5)
GFR est AA: >60 mL/min/{1.73_m2} (ref >60)
GFR est non-AA: >60 mL/min/{1.73_m2} (ref >60)
Glucose: 79 mg/dL (ref 65–100)
Potassium: 4.1 mmol/L (ref 3.5–5.1)
Sodium: 131 mmol/L — ABNORMAL LOW (ref 136–145)

## 2013-08-31 LAB — MAGNESIUM
Magnesium: 1 mg/dL — CL (ref 1.8–2.4)
Magnesium: 4.1 mg/dL — ABNORMAL HIGH (ref 1.8–2.4)

## 2013-08-31 LAB — H PYLORI, IGG, QL: H. pylori: POSITIVE — AB

## 2013-08-31 LAB — PHOSPHORUS: Phosphorus: 2.5 MG/DL (ref 2.5–4.5)

## 2013-08-31 MED ADMIN — lisinopril (PRINIVIL, ZESTRIL) tablet 40 mg: ORAL | @ 13:00:00 | NDC 68084019811

## 2013-08-31 MED ADMIN — magnesium sulfate 4 g/100 ml IVPB: INTRAVENOUS | @ 15:00:00 | NDC 00409672923

## 2013-08-31 MED ADMIN — chlordiazePOXIDE (LIBRIUM) capsule 10 mg: ORAL | @ 20:00:00 | NDC 51079037501

## 2013-08-31 MED ADMIN — pantoprazole (PROTONIX) injection 40 mg: INTRAVENOUS | @ 13:00:00 | NDC 63323018610

## 2013-08-31 MED ADMIN — chlordiazePOXIDE (LIBRIUM) capsule 10 mg: ORAL | @ 13:00:00 | NDC 51079037501

## 2013-08-31 MED ADMIN — lorazepam (ATIVAN) injection 1 mg: INTRAVENOUS | @ 23:00:00 | NDC 00641604401

## 2013-08-31 MED ADMIN — 0.9% sodium chloride with KCl 20 mEq/L infusion: INTRAVENOUS | @ 05:00:00 | NDC 00409711509

## 2013-08-31 MED ADMIN — sodium chloride (NS) flush 5-10 mL: INTRAVENOUS | @ 02:00:00 | NDC 87701099893

## 2013-08-31 MED ADMIN — lorazepam (ATIVAN) injection 2 mg: INTRAVENOUS | @ 10:00:00 | NDC 00641604401

## 2013-08-31 MED ADMIN — magnesium oxide (MAG-OX) tablet 400 mg: ORAL | @ 20:00:00 | NDC 96295013573

## 2013-08-31 MED ADMIN — sodium chloride (NS) flush 5-10 mL: INTRAVENOUS | @ 10:00:00 | NDC 87701099893

## 2013-08-31 MED ADMIN — magnesium oxide (MAG-OX) tablet 400 mg: ORAL | @ 22:00:00 | NDC 96295013573

## 2013-08-31 MED ADMIN — 0.9% sodium chloride 1,000 mL with mvi, adult no.4 with vit K 3,300 unit- 150 mcg/10 mL 10 mL, thiamine 100 mg, folic acid 1 mg infusion: INTRAVENOUS | @ 15:00:00 | NDC 63323018410

## 2013-08-31 MED ADMIN — lorazepam (ATIVAN) injection 2 mg: INTRAVENOUS | @ 04:00:00 | NDC 00641604401

## 2013-08-31 MED ADMIN — magnesium oxide (MAG-OX) tablet 400 mg: ORAL | @ 13:00:00 | NDC 96295013573

## 2013-08-31 MED ADMIN — pantoprazole (PROTONIX) injection 40 mg: INTRAVENOUS | @ 02:00:00 | NDC 63323018610

## 2013-08-31 MED ADMIN — magnesium sulfate 4 g/100 ml IVPB: INTRAVENOUS | @ 18:00:00 | NDC 00409672923

## 2013-08-31 MED ADMIN — chlordiazePOXIDE (LIBRIUM) capsule 10 mg: ORAL | @ 02:00:00 | NDC 51079037501

## 2013-08-31 NOTE — Progress Notes (Addendum)
GI DAILY PROGRESS NOTE    Admit Date:  08/28/2013    Today's Date:  08/31/2013    CC:  Heme positive stools, hep c ab +    Subjective:     Patient had EGD yesterday with findings of:    IMPRESSION   1. Multiple large gastric ulcers, none actively bleeding or with high   risk stigmata.   2. Nonobstructive Schatzki ring.   3. Otherwise unremarkable upper endoscopy.    Today, the patient is altered and requiring nursing staff at bedside around the clock.  He denies abdominal pain or GI complaints.      Medications:   Current Facility-Administered Medications   Medication Dose Route Frequency   ??? hydralazine (APRESOLINE) 20 mg/mL injection 20 mg  20 mg IntraVENous Q6H PRN   ??? magnesium sulfate 4 g/100 ml IVPB  4 g IntraVENous BID   ??? pantoprazole (PROTONIX) injection 40 mg  40 mg IntraVENous Q12H   ??? magnesium oxide (MAG-OX) tablet 400 mg  400 mg Oral BID   ??? chlordiazePOXIDE (LIBRIUM) capsule 10 mg  10 mg Oral TID   ??? lisinopril (PRINIVIL, ZESTRIL) tablet 40 mg  40 mg Oral DAILY   ??? NUTRITIONAL SUPPORT ELECTROLYTE PRN ORDERS   Does Not Apply PRN   ??? sodium chloride (NS) flush 5-10 mL  5-10 mL IntraVENous Q8H   ??? sodium chloride (NS) flush 5-10 mL  5-10 mL IntraVENous PRN   ??? lorazepam (ATIVAN) injection 2 mg  2 mg IntraVENous Q1H PRN   ??? lorazepam (ATIVAN) injection 1 mg  1 mg IntraVENous Q1H PRN   ??? haloperidol lactate (HALDOL) injection 1 mg  1 mg IntraVENous Q2H PRN   ??? ondansetron (ZOFRAN) injection 4 mg  4 mg IntraVENous Q6H PRN   ??? pneumococcal 23-valent (PNEUMOVAX 23) injection 0.5 mL  0.5 mL IntraMUSCular PRIOR TO DISCHARGE   ??? 0.9% sodium chloride 1,000 mL with mvi, adult no.4 with vit K 3,300 unit- 150 mcg/10 mL 10 mL, thiamine 100 mg, folic acid 1 mg infusion   IntraVENous DAILY   ??? 0.9% sodium chloride with KCl 20 mEq/L infusion   IntraVENous CONTINUOUS   ??? nicotine (NICODERM CQ) 21 mg/24 hr patch 1 patch  1 patch TransDERmal DAILY       Review of Systems:  ROS was obtained, with pertinent positives as listed  above.  No chest pain or SOB.    Diet:  Clear liquid diet    Objective:   Vitals:  BP 138/80   Pulse 90   Temp(Src) 98.6 ??F (37 ??C)   Resp 18   Ht 5\' 8"  (1.727 m)   Wt 55.2 kg (121 lb 11.1 oz)   BMI 18.51 kg/m2   SpO2 97%  Intake/Output:     09/17 1900 - 09/19 0659  In: 3233 [P.O.:240; I.V.:2993]  Out: 2850 [Urine:2850]  Exam:  General appearance: alert, cooperative, no distress  Lungs: clear to auscultation bilaterally anteriorly  Heart: regular rate and rhythm  Abdomen: soft, non-tender. Bowel sounds normal. No masses, no organomegaly  Extremities: extremities normal, atraumatic, no cyanosis or edema  Neuro:  altered    Data Review (Labs):    Recent Labs      08/31/13   0717  08/30/13   0320  08/29/13   0450  08/28/13   2015  08/28/13   1315   WBC  7.0  8.2   --    --    --    HGB  12.6*  12.6*   --    --    --    HCT  35.1*  35.6*   --    --    --    PLT  193  154   --    --    --    MCV  94.9  97.0   --    --    --    NA  131*  133*  130*  126*  123*   K  4.1  4.0  3.2*  4.1  3.0*   CL  100  101  96*  94*  88*   CO2  17*  22  21  20*  24   BUN  6  8  8  7  7    CREA  0.72*  0.90  0.62*  0.70*  0.70*   CA  8.6  8.2*  7.8*  7.8*  7.6*   MG  1.0*  1.4*  2.1  2.2  3.3*   GLU  79  104*  79  107*  106*       Assessment:     Principal Problem:    Delirium tremens (08/28/2013)    Active Problems:    Macrocytosis (08/28/2013)      Hyponatremia (08/28/2013)      Hypomagnesemia (08/28/2013)      Transaminitis (08/28/2013)      Lactic acidosis (08/28/2013)      Alcohol abuse (08/28/2013)      Nausea and vomiting (08/28/2013)      Altered mental status (08/28/2013)      Occult blood positive stool (08/30/2013)      HTN (hypertension) (08/30/2013)      Gastric ulcer (08/31/2013)      Hepatitis C (08/31/2013)    52 year oldmale patient with alcohol and tobacco abuse admitted on 08/28/13 after seizures likely related to alcohol withdrawal seen in consultation for heme positive stool and also found to be hepatitis C Ab positive with elevated  transaminases. EGD with findings of multiple gastric ulcers.      Plan:     - Will need repeat EGD in 8-12 weeks to assess for healing.  - Office follow up in ~4-6 weeks (will need to schedule EGD).  - Await results of ultrasound.  - Continue Protonix BID    Dortha Schwalbe, NP-c     Patient is seen and examined in collaboration with Dr. Kizzie Furnish.  Assessment and plan as per Dr. Kizzie Furnish.    ATTENDING NOTE:  I agree with the above assessment and plan. Will sign off and arrange outpatient GI follow up as outlined above.    Ernestine Conrad, MD

## 2013-08-31 NOTE — Progress Notes (Signed)
Pt AAOx4 most of shift, thought he was at home this morning, however, and wanted OOB to go "get my cigarettes and beer." Given prn Ativan 3x this shift. Pt restless but not fighting or hollering out except when brief being changed. (pulled out foley at beginning of shift).

## 2013-08-31 NOTE — Progress Notes (Signed)
Reviewing patient chart for spiritual concerns  Signed by Randy Brookshire, MDiv, staff chaplain

## 2013-08-31 NOTE — Progress Notes (Signed)
Critical Care Outreach Nurse Progress Report:    Subjective: In to assess pt secondary to transfer from Unit.   MEWS Score: 1 (08/31/13 1457)    Filed Vitals:    08/31/13 0757 08/31/13 1048 08/31/13 1457 08/31/13 1947   BP: 112/67 138/80 144/89 168/95   Pulse: 116 90 89 95   Temp: 98.4 ??F (36.9 ??C) 98.6 ??F (37 ??C) 97.4 ??F (36.3 ??C) 97.3 ??F (36.3 ??C)   Resp: 19 18 17 20    Height:       Weight:       SpO2: 98% 97% 96% 93%        Objective: Pt found awake with sitter at bedside.     Pain Intensity 1: 0 (08/31/13 1513)        Patient Stated Pain Goal: 0    Assessment: Pt is alert and oriented x 3. Pt talking fast, difficult to understand at times. R/A sat 94%. Lungs are course. HR=100.     Plan: Will follow per protocol.

## 2013-08-31 NOTE — Progress Notes (Signed)
Date of Outreach Update:  Brett Becker was seen and assessed.      MEWS Score: 1 (08/30/13 2311)  Filed Vitals:    08/30/13 1731 08/30/13 1745 08/30/13 1800 08/30/13 2311   BP: 156/97 152/97 171/93 191/99   Pulse: 78 78 77 78   Temp:    98.6 ??F (37 ??C)   Resp: 16 23 21 20    Height:       Weight:       SpO2: 98% 94% 97% 97%         Pain Assessment  Pain Intensity 1: 0 (08/30/13 1800)        Patient Stated Pain Goal: 0      Previous Outreach assessment has been reviewed.  There have been no significant clinical changes since the completion of the last dated Outreach assessment.    Will continue to follow up per outreach protocol.    Signed By:   Rexford Maus, RN    August 31, 2013 1:48 AM

## 2013-08-31 NOTE — Progress Notes (Deleted)
Discharge instructions verbal and written given to patient and wife with no further questions or concerns.  Reviewed stroke booklet as well.  Discharging via wheelchair accompanied by Zachary George, RN.  Prescriptions given to patient as well.

## 2013-08-31 NOTE — Progress Notes (Signed)
Date of Outreach Update:  Brett Becker was seen and assessed.      MEWS Score: 1 (08/31/13 1048)  Filed Vitals:    08/30/13 2311 08/31/13 0626 08/31/13 0757 08/31/13 1048   BP: 191/99 147/79 112/67 138/80   Pulse: 78 77 116 90   Temp: 98.6 ??F (37 ??C)  98.4 ??F (36.9 ??C) 98.6 ??F (37 ??C)   Resp: 20 22 19 18    Height:       Weight:       SpO2: 97% 100% 98% 97%         Pain Assessment  Pain Intensity 1: 0 (08/31/13 0855)        Patient Stated Pain Goal: 0      Previous Outreach assessment has been reviewed.  There have been no significant clinical changes since the completion of the last dated Outreach assessment.    Will continue to follow up per outreach protocol.    Signed By:   Gregery Na Herring, RN    August 31, 2013 2:37 PM

## 2013-08-31 NOTE — Progress Notes (Signed)
Hospitalist Progress Note    Subjective:   Daily Progress Note: 08/31/2013 8:41 AM    Brett Becker is a 52 yo WM who has a history of alcohol abuse admitted 9/16 after having a seizure outside of the hospital that occurred again in the ED. No pmh of seizures. He had been having nausea and vomiting for several days prior to admission and had decreased oral intake, including alcohol. He was admitted to the ED for treatment of DTs. Over the past 24 hours he has required prn haldol x 0 and prn ativan x 4 while on scheduled librium.  Now with soft restraints and a sitter at bedside due to occasionally trying to remove IVs and hallucinating.  Pulled out foley last night.  He is currently arousable and oriented for me to person but not place with no signs of tremors nor agitation. He thinks he was admitted for his back pain and doesn't recall his seizures. He endorses drinking at least 6 beer each day and sometimes he has liquor also.  He told the GI NP that he drinks 40 oz size beers. He is occult blood positive and states he has vomited up blood before but it has been a few month since the last episode that he can recall.  EGD yesterday showed numerous gastric ulcerations with no active bleeding.      Current Facility-Administered Medications   Medication Dose Route Frequency   ??? hydralazine (APRESOLINE) 20 mg/mL injection 20 mg  20 mg IntraVENous Q6H PRN   ??? magnesium sulfate 4 g/100 ml IVPB  4 g IntraVENous BID   ??? pantoprazole (PROTONIX) injection 40 mg  40 mg IntraVENous Q12H   ??? magnesium oxide (MAG-OX) tablet 400 mg  400 mg Oral BID   ??? chlordiazePOXIDE (LIBRIUM) capsule 10 mg  10 mg Oral TID   ??? lisinopril (PRINIVIL, ZESTRIL) tablet 40 mg  40 mg Oral DAILY   ??? NUTRITIONAL SUPPORT ELECTROLYTE PRN ORDERS   Does Not Apply PRN   ??? [COMPLETED] potassium chloride (K-DUR, KLOR-CON) SR tablet 40 mEq  40 mEq Oral BID   ??? sodium chloride (NS) flush 5-10 mL  5-10 mL IntraVENous Q8H   ??? sodium chloride (NS) flush 5-10 mL  5-10  mL IntraVENous PRN   ??? lorazepam (ATIVAN) injection 2 mg  2 mg IntraVENous Q1H PRN   ??? lorazepam (ATIVAN) injection 1 mg  1 mg IntraVENous Q1H PRN   ??? haloperidol lactate (HALDOL) injection 1 mg  1 mg IntraVENous Q2H PRN   ??? ondansetron (ZOFRAN) injection 4 mg  4 mg IntraVENous Q6H PRN   ??? pneumococcal 23-valent (PNEUMOVAX 23) injection 0.5 mL  0.5 mL IntraMUSCular PRIOR TO DISCHARGE   ??? 0.9% sodium chloride 1,000 mL with mvi, adult no.4 with vit K 3,300 unit- 150 mcg/10 mL 10 mL, thiamine 100 mg, folic acid 1 mg infusion   IntraVENous DAILY   ??? 0.9% sodium chloride with KCl 20 mEq/L infusion   IntraVENous CONTINUOUS   ??? nicotine (NICODERM CQ) 21 mg/24 hr patch 1 patch  1 patch TransDERmal DAILY        Review of Systems  ROS unobtainable due to patient not being oriented/mental status.    Objective:     BP 112/67   Pulse 116   Temp(Src) 98.4 ??F (36.9 ??C)   Resp 19   Ht 5\' 8"  (1.727 m)   Wt 55.2 kg (121 lb 11.1 oz)   BMI 18.51 kg/m2   SpO2 98% O2 Flow  Rate (L/min): 3 l/min O2 Device: Room air    Temp (24hrs), Avg:98.4 ??F (36.9 ??C), Min:98.2 ??F (36.8 ??C), Max:98.6 ??F (37 ??C)       09/17 1900 - 09/19 1610  In: 3233 [P.O.:240; I.V.:2993]  Out: 2850 [Urine:2850]    PHYSICAL EXAM:    General:  Awake, alert, oriented to person but not time nor place, restrained, no acute distress  Eyes:  Non icteric, EOMI  Neck:  Supple  CV:  RRR  PULM:  CTAB, non labored, normal effort  Abd:  Soft, nondistended, not tender, no rebound nor guarding, active bowel sounds  Skin/ext:  Warm, dry, no lower ext edema, no obvious rashes    Additional comments:  All labs, notes and studies from the past 24 hours have been personally reviewed today by me.    Data Review    Recent Results (from the past 24 hour(s))   MAGNESIUM    Collection Time     08/31/13  7:17 AM       Result Value Range    Magnesium 1.0 (*) 1.8 - 2.4 mg/dL   METABOLIC PANEL, BASIC    Collection Time     08/31/13  7:17 AM       Result Value Range    Sodium 131 (*) 136 - 145  mmol/L    Potassium 4.1  3.5 - 5.1 mmol/L    Chloride 100  98 - 107 mmol/L    CO2 17 (*) 21 - 32 mmol/L    Anion gap 14  7 - 16 mmol/L    Glucose 79  65 - 100 mg/dL    BUN 6  6 - 23 MG/DL    Creatinine 9.60 (*) 0.8 - 1.5 MG/DL    GFR est AA >45  >40 ml/min/1.5m2    GFR est non-AA >60  >60 ml/min/1.79m2    Calcium 8.6  8.3 - 10.4 MG/DL   CBC WITH AUTOMATED DIFF    Collection Time     08/31/13  7:17 AM       Result Value Range    WBC 7.0  4.3 - 11.1 K/uL    RBC 3.70 (*) 4.23 - 5.67 M/uL    HGB 12.6 (*) 13.6 - 17.2 g/dL    HCT 98.1 (*) 19.1 - 50.3 %    MCV 94.9  79.6 - 97.8 FL    MCH 34.1 (*) 26.1 - 32.9 PG    MCHC 35.9 (*) 31.4 - 35.0 g/dL    RDW 47.8  29.5 - 62.1 %    PLATELET 193  150 - 450 K/uL    MPV 10.0 (*) 10.8 - 14.1 FL    DF AUTOMATED      NEUTROPHILS 69  43 - 78 %    LYMPHOCYTES 16  13 - 44 %    MONOCYTES 13 (*) 4.0 - 12.0 %    EOSINOPHILS 2  0.5 - 7.8 %    BASOPHILS 0  0.0 - 2.0 %    IMMATURE GRANULOCYTES 0.3  0.0 - 5.0 %    ABS. NEUTROPHILS 4.7  1.7 - 8.2 K/UL    ABS. LYMPHOCYTES 1.2  0.5 - 4.6 K/UL    ABS. MONOCYTES 0.9  0.1 - 1.3 K/UL    ABS. EOSINOPHILS 0.2  0.0 - 0.8 K/UL    ABS. BASOPHILS 0.0  0.0 - 0.2 K/UL    ABS. IMM. GRANS. 0.0  0.0 - 0.5 K/UL   PHOSPHORUS    Collection Time  08/31/13  7:17 AM       Result Value Range    Phosphorus 2.5  2.5 - 4.5 MG/DL         Assessment/Plan:     Principal Problem:    Delirium tremens (08/28/2013)    Active Problems:    Macrocytosis (08/28/2013)      Hyponatremia (08/28/2013)      Hypomagnesemia (08/28/2013)      Transaminitis (08/28/2013)      Lactic acidosis (08/28/2013)      Alcohol abuse (08/28/2013)      Nausea and vomiting (08/28/2013)      Altered mental status (08/28/2013)      Occult blood positive stool (08/30/2013)      HTN (hypertension) (08/30/2013)      Gastric ulcer (08/31/2013)      Hepatitis C (08/31/2013)    Alcohol withdrawal/DTs:  Due to patient's hallucinations and altered sensorium, will increase scheduled librium to 25 mg TID and continue with prn  haldol and ativan.  Has banana bags/MVI daily x 3 days.    Occult blood positive:  Multiple gastric ulcerations, on protonix.  Hg stable, no active bleeding.  Will need repeat EGD as outpatient.  H pylori studies per GI.    Hypomag:  Replacing.    Hepatitis C:  Obtaining abdominal US today.    HTN:  Likely due to withdrawal.  Adding prn hydralazine      Care Plan discussed with: The patient and his care team.    Total time spent with patient: 25 minutes.

## 2013-08-31 NOTE — Progress Notes (Signed)
Date of Outreach Update:  Brett Becker was seen and assessed.      MEWS Score: 1 (08/30/13 2311)  Filed Vitals:    08/30/13 1731 08/30/13 1745 08/30/13 1800 08/30/13 2311   BP: 156/97 152/97 171/93 191/99   Pulse: 78 78 77 78   Temp:    98.6 ??F (37 ??C)   Resp: 16 23 21 20    Height:       Weight:       SpO2: 98% 94% 97% 97%         Pain Assessment  Pain Intensity 1: 0 (08/30/13 1800)        Patient Stated Pain Goal: 0      Previous Outreach assessment has been reviewed.  There have been no significant clinical changes since the completion of the last dated Outreach assessment.    Will continue to follow up per outreach protocol.    Signed By:   Rexford Maus, RN    August 31, 2013 6:06 AM

## 2013-08-31 NOTE — Progress Notes (Signed)
[facility] OUTREACH NURSE PROGRESS REPORT      SUBJECTIVE: Called to assess patient secondary to transfer from ICU.      MEWS Score: 1 (08/30/13 2311)  Filed Vitals:    08/30/13 1800 08/30/13 2311 08/31/13 0626 08/31/13 0757   BP: 171/93 191/99 147/79 112/67   Pulse: 77 78 77 116   Temp:  98.6 ??F (37 ??C)  98.4 ??F (36.9 ??C)   Resp: 21 20 22 19    Height:       Weight:       SpO2: 97% 97% 100% 98%      EKG: there are no previous tracings available for comparison.     LAB DATA:    Recent Labs      08/31/13   0717  08/30/13   0320  08/29/13   0450   NA  131*  133*  130*   K  4.1  4.0  3.2*   CL  100  101  96*   CO2  17*  22  21   AGAP  14  10  13    GLU  79  104*  79   BUN  6  8  8    CREA  0.72*  0.90  0.62*   GFRAA  >60  >60  >60   GFRNA  >60  >60  >60   CA  8.6  8.2*  7.8*   MG  1.0*  1.4*  2.1   PHOS  2.5  1.9*   --         Recent Labs      08/31/13   0717  08/30/13   0320   WBC  7.0  8.2   HGB  12.6*  12.6*   HCT  35.1*  35.6*   PLT  193  154          OBJECTIVE: On arrival to room, I found patient to be in bed, sitter present.     BP 112/67   Pulse 116   Temp(Src) 98.4 ??F (36.9 ??C)   Resp 19   Ht 5\' 8"  (1.727 m)   Wt 55.2 kg (121 lb 11.1 oz)   BMI 18.51 kg/m2   SpO2 98%  General appearance: alert, cooperative, confused  Lungs: clear to auscultation bilaterally       Pain Assessment  Pain Intensity 1: 0 (08/30/13 1800)        Patient Stated Pain Goal: 0                                 ASSESSMENT:  Patient currently on room air, sats 92%, lung sounds clear. Patient oriented to self only.     PLAN:  Will continue to follow patient per outreach program.

## 2013-09-01 LAB — CBC WITH AUTOMATED DIFF
ABS. BASOPHILS: 0 10*3/uL (ref 0.0–0.2)
ABS. EOSINOPHILS: 0 10*3/uL (ref 0.0–0.8)
ABS. IMM. GRANS.: 0 10*3/uL (ref 0.0–0.5)
ABS. LYMPHOCYTES: 1 10*3/uL (ref 0.5–4.6)
ABS. MONOCYTES: 1.2 10*3/uL (ref 0.1–1.3)
ABS. NEUTROPHILS: 7.5 10*3/uL (ref 1.7–8.2)
BASOPHILS: 0 % (ref 0.0–2.0)
EOSINOPHILS: 0 % — ABNORMAL LOW (ref 0.5–7.8)
HCT: 38.8 % — ABNORMAL LOW (ref 41.1–50.3)
HGB: 13.7 g/dL (ref 13.6–17.2)
IMMATURE GRANULOCYTES: 0.3 % (ref 0.0–5.0)
LYMPHOCYTES: 10 % — ABNORMAL LOW (ref 13–44)
MCH: 34.4 PG — ABNORMAL HIGH (ref 26.1–32.9)
MCHC: 35.3 g/dL — ABNORMAL HIGH (ref 31.4–35.0)
MCV: 97.5 FL (ref 79.6–97.8)
MONOCYTES: 12 % (ref 4.0–12.0)
MPV: 9.7 FL — ABNORMAL LOW (ref 10.8–14.1)
NEUTROPHILS: 78 % (ref 43–78)
PLATELET: 183 10*3/uL (ref 150–450)
RBC: 3.98 M/uL — ABNORMAL LOW (ref 4.23–5.67)
RDW: 12.5 % (ref 11.9–14.6)
WBC: 9.8 10*3/uL (ref 4.3–11.1)

## 2013-09-01 LAB — METABOLIC PANEL, BASIC
Anion gap: 17 mmol/L — ABNORMAL HIGH (ref 7–16)
BUN: 7 MG/DL (ref 6–23)
CO2: 17 mmol/L — ABNORMAL LOW (ref 21–32)
Calcium: 8.1 MG/DL — ABNORMAL LOW (ref 8.3–10.4)
Chloride: 97 mmol/L — ABNORMAL LOW (ref 98–107)
Creatinine: 0.71 MG/DL — ABNORMAL LOW (ref 0.8–1.5)
GFR est AA: >60 mL/min/{1.73_m2} (ref >60)
GFR est non-AA: >60 mL/min/{1.73_m2} (ref >60)
Glucose: 87 mg/dL (ref 65–100)
Potassium: 3.7 mmol/L (ref 3.5–5.1)
Sodium: 131 mmol/L — ABNORMAL LOW (ref 136–145)

## 2013-09-01 LAB — PHOSPHORUS: Phosphorus: 4.3 MG/DL (ref 2.5–4.5)

## 2013-09-01 LAB — MAGNESIUM: Magnesium: 1.7 mg/dL — ABNORMAL LOW (ref 1.8–2.4)

## 2013-09-01 MED ADMIN — 0.9% sodium chloride with KCl 20 mEq/L infusion: INTRAVENOUS | @ 22:00:00 | NDC 00409711509

## 2013-09-01 MED ADMIN — magnesium oxide (MAG-OX) tablet 400 mg: ORAL | @ 13:00:00 | NDC 96295013573

## 2013-09-01 MED ADMIN — chlordiazePOXIDE (LIBRIUM) capsule 10 mg: ORAL | @ 13:00:00 | NDC 51079037501

## 2013-09-01 MED ADMIN — chlordiazePOXIDE (LIBRIUM) capsule 10 mg: ORAL | @ 21:00:00 | NDC 51079037501

## 2013-09-01 MED ADMIN — sodium chloride (NS) flush 5-10 mL: INTRAVENOUS | @ 02:00:00 | NDC 87701099893

## 2013-09-01 MED ADMIN — magnesium sulfate 2 g/50 ml IVPB (premix or compounded): INTRAVENOUS | @ 15:00:00 | NDC 00409672924

## 2013-09-01 MED ADMIN — sodium chloride (NS) flush 5-10 mL: INTRAVENOUS | @ 19:00:00 | NDC 87701099893

## 2013-09-01 MED ADMIN — lisinopril (PRINIVIL, ZESTRIL) tablet 40 mg: ORAL | @ 13:00:00 | NDC 68084019811

## 2013-09-01 MED ADMIN — 0.9% sodium chloride with KCl 20 mEq/L infusion: INTRAVENOUS | @ 01:00:00 | NDC 00264786500

## 2013-09-01 MED ADMIN — potassium chloride (K-DUR, KLOR-CON) SR tablet 20 mEq: ORAL | @ 15:00:00 | NDC 68084036011

## 2013-09-01 MED ADMIN — sodium chloride (NS) flush 5-10 mL: INTRAVENOUS | @ 10:00:00 | NDC 87701099893

## 2013-09-01 MED ADMIN — lorazepam (ATIVAN) injection 1 mg: INTRAVENOUS | @ 10:00:00 | NDC 00641604401

## 2013-09-01 MED ADMIN — magnesium oxide (MAG-OX) tablet 400 mg: ORAL | @ 21:00:00 | NDC 96295013573

## 2013-09-01 MED ADMIN — pantoprazole (PROTONIX) injection 40 mg: INTRAVENOUS | @ 03:00:00 | NDC 63323018610

## 2013-09-01 MED ADMIN — 0.9% sodium chloride with KCl 20 mEq/L infusion: INTRAVENOUS | @ 10:00:00 | NDC 00409711509

## 2013-09-01 MED ADMIN — chlordiazePOXIDE (LIBRIUM) capsule 10 mg: ORAL | @ 03:00:00 | NDC 51079037501

## 2013-09-01 MED ADMIN — 0.9% sodium chloride with KCl 20 mEq/L infusion: INTRAVENOUS | NDC 00048002116

## 2013-09-01 MED ADMIN — 0.9% sodium chloride 1,000 mL with mvi, adult no.4 with vit K 3,300 unit- 150 mcg/10 mL 10 mL, thiamine 100 mg, folic acid 1 mg infusion: INTRAVENOUS | @ 13:00:00 | NDC 63323018410

## 2013-09-01 MED ADMIN — pantoprazole (PROTONIX) injection 40 mg: INTRAVENOUS | @ 13:00:00 | NDC 63323018610

## 2013-09-01 MED ADMIN — amlodipine (NORVASC) tablet 5 mg: ORAL | @ 15:00:00 | NDC 59762153002

## 2013-09-01 NOTE — Progress Notes (Addendum)
Problem: Dysphagia (Adult)  Goal: *Acute Goals and Plan of Care (Insert Text)  STG: Patient will safely swallow mechanical soft diet and nectar thick liquids without overt signs or symptoms of aspiration 90% of the time.  STG: Patient will participate in Modified Barium Swallow study x1.  STG: Patient will perform laryngeal strengthening exercises x10 each with 80% accuracy.  LTG: Patient will consume least restrictive diet without respiratory compromise 100% of the time by discharge.    Speech language pathology: bedside swallow note: Initial Assessment    NAME/AGE/GENDER: Brett Becker is a 52 y.o. male  DATE: 09/01/2013  PRIMARY DIAGNOSIS: Delirium tremens  Delirium tremens  heme positive stool; anemia  Procedure(s) (LRB):  ESOPHAGOGASTRODUODENOSCOPY (EGD) (N/A) 2 Days Post-Op  Treatment Diagnosis: dysphagia  INTERDISCIPLINARY COLLABORATION: Certified Nursing Assistant/Patient Care Technician  PRECAUTIONS/ALLERGIES: Review of patient's allergies indicates no known allergies. ASSESSMENT:Based on the objective data described below, [name] presents with delayed throat clear with thin liquids. Initially, swallows of nectar liquids by cup and straw, puree, pudding, mixed consistencies and cracker were clear to cervical auscultation with adequate laryngeal excursion to palpation and no overt signs or symptoms of aspiration. However, at conclusion of assessment, patient with mild cough. PCT in room and stated patient coughing throughout breakfast. Recommend further assessment of pharyngeal phase of swallow via Modified Barium Swallow (MBS) study. Spoke with Dr. Arlester Marker, who is in agreement. MBS scheduled for Monday.  In the meantime, upgrade diet to mechanical soft with no mixed consistencies, thicken all liquids to nectar. Patient will benefit from skilled intervention to address the below impairments.????????This section established at most recent assessment??????????  PROBLEM LIST (Impairments causing functional  limitations):  1. dysphagia  REHABILITATION POTENTIAL FOR STATED GOALS: Good  PLAN OF CARE:   Patient will benefit from skilled intervention to address the following impairments.  RECOMMENDATIONS AND PLANNED INTERVENTIONS (Benefits and precautions of therapy have been discussed with the patient.):  ?? PO:  Mechanical soft  ?? Liquids:  nectar  MEDICATIONS:  ?? With Thickened Liquid  COMPENSATORY STRATEGIES/MODIFICATIONS INCLUDING:  ?? Upright for all PO and remain upright for 20 min after PO  OTHER RECOMMENDATIONS (including follow up treatment recommendations):   ?? Family training/education  ?? Laryngeal exercises  ?? Patient education  RECOMMENDED DIET MODIFICATIONS DISCUSSED WITH:  ?? Hospitalist  ?? Nursing  ?? Patient  FREQUENCY/DURATION: Continue to follow patient 3-5 times a week for duration of hospital stay to address above goals.RECOMMENDED REHABILITATION/EQUIPMENT: (at time of discharge pending progress):   Rehab.SUBJECTIVE:   Patient pleasant and cooperative. PCT in room.  History of Present Injury/Illness: Brett Becker  has no past medical history on file.  He also  has past surgical history that includes neurological procedure unlisted.   Present Symptoms: choking with clear liquid diet   Pain Intensity 1: 0  Current Medications:   No current facility-administered medications on file prior to encounter.     No current outpatient prescriptions on file prior to encounter.     Current Dietary Status:  Clear liquid      History of reflux:  YES     Reflux medication:Protonix  Social History/Home Situation:    Home Environment: Other (comment) (duplex)  # Steps to Enter: 4  One/Two Story Residence: One story  Living Alone: No  Support Systems: Child(ren);Family member(s);Friends \\ neighbors;Significant other  Patient Expects to be Discharged to:: Trailer/mobile home  Current DME Used/Available at Home: None  OBJECTIVE:   Respiratory Status:  CXR Results:08/28/13 IMPRESSION: Bibasilar infiltrate    MRI/CT  Results:None    Cognitive and Communication Status:  Neurologic State: Alert  Orientation Level: Oriented to person;Oriented to place  Cognition: Follows commands  Perception: Appears intact  Perseveration: No perseveration noted  Safety/Judgement: Awareness of environment    BEDSIDE SWALLOW EVALUATION  Oral Assessment:  Oral Assessment  Labial: No impairment  Dentition: Limited;Natural;Poor  Oral Hygiene: adequate  Lingual: No impairment  P.O. Trials:  Patient Position: upright in bed    The patient was given single tsp to serial straw swallow amounts of the following:   Consistency Presented: Thin liquid;Nectar thick liquid;Puree;Pudding;Mechanical soft;Solid  How Presented: Self-fed/presented;SLP-fed/presented;Cup/sip;Cup/gulp;Spoon;Straw;Successive swallows    ORAL PHASE:  Bolus Acceptance: No impairment  Bolus Formation/Control: Impaired  Propulsion: Delayed (# of seconds)  Type of Impairment: Delayed;Mastication;Piecemeal  Oral Residue: None    PHARYNGEAL PHASE:  Initiation of Swallow: Delayed (# of seconds)  Laryngeal Elevation: Functional  Aspiration Signs/Symptoms: Delayed cough/throat clear  Vocal Quality: No impairment           Pharyngeal Phase Characteristics: Double swallow;Multiple swallows;Suspected pharyngeal residue    OTHER OBSERVATIONS:  Rate/bite size: WNL   Endurance:  WNL      Tool Used: Dysphagia Outcome and Severity Scale (DOSS)    Score Comments   Normal Diet  []  7 With no strategies or extra time needed   Functional Swallow  []  6 May have mild oral or pharyngeal delay       Mild Dysphagia    []  5 Which may require one diet consistency restricted (those who demonstrate penetration which is entirely cleared on MBS would be included)   Mild-Moderate Dysphagia  [x]  4 With 1-2 diet consistencies restricted       Moderate Dysphagia  []  3 With 2 or more diet consistencies restricted       Moderately Severe Dysphagia  []  2 With partial PO strategies (trials with ST only)       Severe Dysphagia  []   1 With inability to tolerate any PO safely          Score:  Initial: 4 Most Recent: X (Date: -- )   Interpretation of Tool: The Dysphagia Outcome and Severity Scale (DOSS) is a simple, easy-to-use, 7-point scale developed to systematically rate the functional severity of dysphagia based on objective assessment and make recommendations for diet level, independence level, and type of nutrition.     Score 7 6 5 4 3 2 1    Modifier CH CI CJ CK CL CM CN     Swallowing:    Y4034 - CURRENT STATUS: CK - 40%-59% impaired, limited or restricted   V4259 - GOAL STATUS:  CI - 1%-19% impaired, limited or restricted   D6387 - D/C STATUS:  ---------------To be determined---------------  [place] [facility] [place]  [place] [facility] [place]  Product Type: Managed Care Medicaid      TREATMENT:    (In addition to Assessment/Re-Assessment sessions the following treatments were rendered)  Assessment/Reassessment only, no treatment provided today  MODALITIES:                                                                    ORAL MOTOR  EXERCISES:  LARYNGEAL / PHARYNGEAL EXERCISES:                                                                                                                                     __________________________________________________________________________________________________  Safety:   After treatment position/precautions:  ?? RN notified  ?? Spoke with MD  ?? Upright in Bed  Treatment Assessment:    Progression/Medical Necessity:   ?? Patient is expected to demonstrate progress in swallow function, diet tolerance and swallow safety to improve swallow safety, work toward diet advancement and decrease aspiration risk.  Compliance with Program/Exercises: Will assess as treatment progresses.   Reason for Continuation of Services/Other Comments:  ??  Patient continues to require skilled intervention due to dysphagia.  Recommendations/Intent for next treatment session: "Treatment next visit will focus on Modified Barium Swallow  study".    Total Treatment Duration:  Time In: 1230  Time Out: 1300    Ailene Ards, [place], CCC-SLP

## 2013-09-01 NOTE — Progress Notes (Signed)
Hospitalist Progress Note    Subjective:   Daily Progress Note: 09/01/2013 10:14 AM    Brett Becker is a 52 yo WM who has a history of alcohol abuse admitted 9/16 after having a seizure outside of the hospital that occurred again in the ED. No pmh of seizures. He had been having nausea and vomiting for several days prior to admission and had decreased oral intake, including alcohol. He was admitted to the ED for treatment of DTs. Over the past 24 hours he has required prn haldol x 0 and prn ativan x 2 while on scheduled librium. Now with soft restraints and a sitter at bedside due to occasionally trying to remove IVs and hallucinating.  He is currently arousable and oriented for me to person but not place with no signs of tremors nor agitation. Course complicated by occult positive stool and endorsing hematemesis. EGD performed by GI showing numerous gastric ulcers and is H pylori positive.     No acute events overnight.  Roommate at bedside, all questions answered.    Current Facility-Administered Medications   Medication Dose Route Frequency   ??? amlodipine (NORVASC) tablet 5 mg  5 mg Oral DAILY   ??? potassium chloride (K-DUR, KLOR-CON) SR tablet 20 mEq  20 mEq Oral NOW   ??? magnesium sulfate 2 g/50 ml IVPB (premix or compounded)  2 g IntraVENous ONCE   ??? hydralazine (APRESOLINE) 20 mg/mL injection 20 mg  20 mg IntraVENous Q6H PRN   ??? [COMPLETED] magnesium sulfate 4 g/100 ml IVPB  4 g IntraVENous BID   ??? pantoprazole (PROTONIX) injection 40 mg  40 mg IntraVENous Q12H   ??? magnesium oxide (MAG-OX) tablet 400 mg  400 mg Oral BID   ??? chlordiazePOXIDE (LIBRIUM) capsule 10 mg  10 mg Oral TID   ??? lisinopril (PRINIVIL, ZESTRIL) tablet 40 mg  40 mg Oral DAILY   ??? NUTRITIONAL SUPPORT ELECTROLYTE PRN ORDERS   Does Not Apply PRN   ??? sodium chloride (NS) flush 5-10 mL  5-10 mL IntraVENous Q8H   ??? sodium chloride (NS) flush 5-10 mL  5-10 mL IntraVENous PRN   ??? lorazepam (ATIVAN) injection 2 mg  2 mg IntraVENous Q1H PRN   ??? lorazepam  (ATIVAN) injection 1 mg  1 mg IntraVENous Q1H PRN   ??? haloperidol lactate (HALDOL) injection 1 mg  1 mg IntraVENous Q2H PRN   ??? ondansetron (ZOFRAN) injection 4 mg  4 mg IntraVENous Q6H PRN   ??? pneumococcal 23-valent (PNEUMOVAX 23) injection 0.5 mL  0.5 mL IntraMUSCular PRIOR TO DISCHARGE   ??? 0.9% sodium chloride 1,000 mL with mvi, adult no.4 with vit K 3,300 unit- 150 mcg/10 mL 10 mL, thiamine 100 mg, folic acid 1 mg infusion   IntraVENous DAILY   ??? 0.9% sodium chloride with KCl 20 mEq/L infusion   IntraVENous CONTINUOUS   ??? nicotine (NICODERM CQ) 21 mg/24 hr patch 1 patch  1 patch TransDERmal DAILY        Review of Systems  Patient denies fever, chills, chest pain, sob, abdominal pain, dysuria, dizziness.     Objective:     BP 157/96   Pulse 89   Temp(Src) 98.1 ??F (36.7 ??C)   Resp 20   Ht 5\' 8"  (1.727 m)   Wt 55.2 kg (121 lb 11.1 oz)   BMI 18.51 kg/m2   SpO2 94% O2 Flow Rate (L/min): 3 l/min O2 Device: Room air    Temp (24hrs), Avg:98.2 ??F (36.8 ??C), Min:97.3 ??F (36.3 ??  C), Max:99.5 ??F (37.5 ??C)       09/18 1900 - 09/20 0659  In: 1816 [P.O.:480; I.V.:1336]  Out: 700 [Urine:700]    PHYSICAL EXAM:    General:  Awake, alert, answering questions appropriately but slowly, oriented to person, time and place, no acute distress  Eyes:  Non icteric, EOMI  Neck:  Supple  CV:  RRR  PULM:  CTAB, non labored, normal effort, no accessory muscle use  ABD:  Soft, nontender, nondistended, active bowel sounds  Skin/ext:  Warm, dry, no obvious rashes    Additional comments:  All labs, notes and studies from the past 24 hours have been personally reviewed today by me.    Data Review    Recent Results (from the past 24 hour(s))   MAGNESIUM    Collection Time     08/31/13  3:00 PM       Result Value Range    Magnesium 4.1 (*) 1.8 - 2.4 mg/dL   CBC WITH AUTOMATED DIFF    Collection Time     09/01/13  5:42 AM       Result Value Range    WBC 9.8  4.3 - 11.1 K/uL    RBC 3.98 (*) 4.23 - 5.67 M/uL    HGB 13.7  13.6 - 17.2 g/dL    HCT 09.8 (*)  11.9 - 50.3 %    MCV 97.5  79.6 - 97.8 FL    MCH 34.4 (*) 26.1 - 32.9 PG    MCHC 35.3 (*) 31.4 - 35.0 g/dL    RDW 14.7  82.9 - 56.2 %    PLATELET 183  150 - 450 K/uL    MPV 9.7 (*) 10.8 - 14.1 FL    DF AUTOMATED      NEUTROPHILS 78  43 - 78 %    LYMPHOCYTES 10 (*) 13 - 44 %    MONOCYTES 12  4.0 - 12.0 %    EOSINOPHILS 0 (*) 0.5 - 7.8 %    BASOPHILS 0  0.0 - 2.0 %    IMMATURE GRANULOCYTES 0.3  0.0 - 5.0 %    ABS. NEUTROPHILS 7.5  1.7 - 8.2 K/UL    ABS. LYMPHOCYTES 1.0  0.5 - 4.6 K/UL    ABS. MONOCYTES 1.2  0.1 - 1.3 K/UL    ABS. EOSINOPHILS 0.0  0.0 - 0.8 K/UL    ABS. BASOPHILS 0.0  0.0 - 0.2 K/UL    ABS. IMM. GRANS. 0.0  0.0 - 0.5 K/UL   METABOLIC PANEL, BASIC    Collection Time     09/01/13  5:42 AM       Result Value Range    Sodium 131 (*) 136 - 145 mmol/L    Potassium 3.7  3.5 - 5.1 mmol/L    Chloride 97 (*) 98 - 107 mmol/L    CO2 17 (*) 21 - 32 mmol/L    Anion gap 17 (*) 7 - 16 mmol/L    Glucose 87  65 - 100 mg/dL    BUN 7  6 - 23 MG/DL    Creatinine 1.30 (*) 0.8 - 1.5 MG/DL    GFR est AA >86  >57 ml/min/1.56m2    GFR est non-AA >60  >60 ml/min/1.40m2    Calcium 8.1 (*) 8.3 - 10.4 MG/DL   MAGNESIUM    Collection Time     09/01/13  5:42 AM       Result Value Range    Magnesium 1.7 (*)  1.8 - 2.4 mg/dL   PHOSPHORUS    Collection Time     09/01/13  5:42 AM       Result Value Range    Phosphorus 4.3  2.5 - 4.5 MG/DL         Assessment/Plan:     Principal Problem:    Delirium tremens (08/28/2013)    Active Problems:    Macrocytosis (08/28/2013)      Hyponatremia (08/28/2013)      Hypomagnesemia (08/28/2013)      Transaminitis (08/28/2013)      Lactic acidosis (08/28/2013)      Alcohol abuse (08/28/2013)      Nausea and vomiting (08/28/2013)      Altered mental status (08/28/2013)      Occult blood positive stool (08/30/2013)      HTN (hypertension) (08/30/2013)      Gastric ulcer (08/31/2013)      Hepatitis C (08/31/2013)    DTs from alcohol withdrawal:  On scheduled librium.  PRN requirements are going down.  Has received banana  bags/MVIs.  Soft restraints and sitter as needed for AMS and hallucinations.    Hyponatremia:  Increasing NS to 150/hr from 100/hr.  Stable.      Gastric ulcers:  h pylori positive.  GI on board, will need follow up EGD as outpatient.  Needs omeprazole/clarithromycin/augmentin therapy.     HTN:  Adding norvasc.  Continue lisinopril and prn hydralazine.  Likely mostly due to DTs.     Replacing K, Mg.        Care Plan discussed with:  The patient and his care team.    Total time spent with patient: 25 minutes.

## 2013-09-01 NOTE — Progress Notes (Signed)
Date of Outreach Update:  Brett Becker was seen and assessed.      MEWS Score: 1 (09/01/13 1509)  Filed Vitals:    09/01/13 0344 09/01/13 0642 09/01/13 1059 09/01/13 1509   BP: 148/84 157/96 141/86 115/64   Pulse: 91 89 79 78   Temp: 99.5 ??F (37.5 ??C) 98.1 ??F (36.7 ??C) 97.7 ??F (36.5 ??C) 97.7 ??F (36.5 ??C)   Resp: 21 20 20 20    Height:       Weight:       SpO2: 92% 94% 97% 91%         Pain Assessment  Pain Intensity 1: 0 (09/01/13 1402)        Patient Stated Pain Goal: 0      Previous Outreach assessment has been reviewed.  There have been no significant clinical changes since the completion of the last dated Outreach assessment.    Will discharge patient from outreach program. Call back if needed.    Signed By:   Gregery Na Herring, RN    September 01, 2013 5:54 PM

## 2013-09-01 NOTE — Progress Notes (Signed)
Date of Outreach Update:  Elzy Tomasello was seen and assessed.      MEWS Score: 1 (09/01/13 1610)  Filed Vitals:    08/31/13 1457 08/31/13 1947 09/01/13 0344 09/01/13 0642   BP: 144/89 168/95 148/84 157/96   Pulse: 89 95 91 89   Temp: 97.4 ??F (36.3 ??C) 97.3 ??F (36.3 ??C) 99.5 ??F (37.5 ??C) 98.1 ??F (36.7 ??C)   Resp: 17 20 21 20    Height:       Weight:       SpO2: 96% 93% 92% 94%         Pain Assessment  Pain Intensity 1: 0 (09/01/13 0705)        Patient Stated Pain Goal: 0      Previous Outreach assessment has been reviewed.  There have been no significant clinical changes since the completion of the last dated Outreach assessment.    Patient calm, room air sats 98%. Patient oriented to self and place. Will continue to follow up per outreach protocol.    Signed By:   Gregery Na Herring, RN    September 01, 2013 10:45 AM

## 2013-09-01 NOTE — Progress Notes (Signed)
Date of Outreach Update:  Brett Becker was seen and assessed.  Previous Outreach assessment has been reviewed.  There have been no significant clinical changes since the completion of the last dated Outreach assessment. Sitter at door. Pt sleeping. Respirations even and regular. No distress noted.    Will continue to follow up per outreach protocol.    Signed By:   Patterson Hammersmith, RN    September 01, 2013 3:31 AM

## 2013-09-02 LAB — CULTURE, BLOOD
Culture result:: NO GROWTH
Culture result:: NO GROWTH

## 2013-09-02 LAB — URINALYSIS W/ RFLX MICROSCOPIC
Bacteria: 0 /hpf
Bilirubin: NEGATIVE
Casts: 0 /lpf
Epithelial cells: 0 /hpf
Glucose: NEGATIVE mg/dL
Ketone: 15 mg/dL — AB
Leukocyte Esterase: NEGATIVE
Nitrites: NEGATIVE
Protein: NEGATIVE mg/dL
Specific gravity: 1.007 (ref 1.001–1.023)
Urobilinogen: 1 EU/dL (ref 0.2–1.0)
WBC: 0 /hpf
pH (UA): 6.5 (ref 5.0–9.0)

## 2013-09-02 LAB — METABOLIC PANEL, BASIC
Anion gap: 11 mmol/L (ref 7–16)
BUN: 5 MG/DL — ABNORMAL LOW (ref 6–23)
CO2: 22 mmol/L (ref 21–32)
Calcium: 8.1 MG/DL — ABNORMAL LOW (ref 8.3–10.4)
Chloride: 100 mmol/L (ref 98–107)
Creatinine: 0.73 MG/DL — ABNORMAL LOW (ref 0.8–1.5)
GFR est AA: >60 mL/min/{1.73_m2} (ref >60)
GFR est non-AA: >60 mL/min/{1.73_m2} (ref >60)
Glucose: 110 mg/dL — ABNORMAL HIGH (ref 65–100)
Potassium: 3.4 mmol/L — ABNORMAL LOW (ref 3.5–5.1)
Sodium: 133 mmol/L — ABNORMAL LOW (ref 136–145)

## 2013-09-02 LAB — PHOSPHORUS: Phosphorus: 3.8 MG/DL (ref 2.5–4.5)

## 2013-09-02 LAB — MAGNESIUM
Magnesium: 1.1 mg/dL — CL (ref 1.8–2.4)
Magnesium: 2 mg/dL (ref 1.8–2.4)

## 2013-09-02 LAB — GLUCOSE, POC: Glucose (POC): 111 mg/dL — ABNORMAL HIGH (ref 65–100)

## 2013-09-02 MED ADMIN — chlordiazePOXIDE (LIBRIUM) capsule 10 mg: ORAL | @ 01:00:00 | NDC 51079037501

## 2013-09-02 MED ADMIN — tramadol (ULTRAM) tablet 50 mg: ORAL | @ 19:00:00 | NDC 51079099101

## 2013-09-02 MED ADMIN — potassium chloride (K-DUR, KLOR-CON) SR tablet 40 mEq: ORAL | @ 13:00:00 | NDC 68084036011

## 2013-09-02 MED ADMIN — sodium chloride 0.9 % bolus infusion 100 mL: INTRAVENOUS | @ 18:00:00 | NDC 00409798423

## 2013-09-02 MED ADMIN — chlordiazePOXIDE (LIBRIUM) capsule 5 mg: ORAL | @ 21:00:00 | NDC 51079037401

## 2013-09-02 MED ADMIN — sodium chloride (NS) flush 5-10 mL: INTRAVENOUS | @ 01:00:00 | NDC 87701099893

## 2013-09-02 MED ADMIN — pantoprazole (PROTONIX) injection 40 mg: INTRAVENOUS | @ 12:00:00 | NDC 63323018610

## 2013-09-02 MED ADMIN — magnesium sulfate 4 g/100 ml IVPB: INTRAVENOUS | @ 10:00:00 | NDC 00409672923

## 2013-09-02 MED ADMIN — iopamidol (ISOVUE-370) 76 % injection 80 mL: INTRAVENOUS | @ 18:00:00 | NDC 00270131635

## 2013-09-02 MED ADMIN — magnesium oxide (MAG-OX) tablet 400 mg: ORAL | @ 12:00:00 | NDC 96295013573

## 2013-09-02 MED ADMIN — 0.9% sodium chloride with KCl 20 mEq/L infusion: INTRAVENOUS | @ 19:00:00 | NDC 00409711509

## 2013-09-02 MED ADMIN — thiamine (B-1) tablet 100 mg: ORAL | @ 13:00:00 | NDC 90011015050

## 2013-09-02 MED ADMIN — multivitamin, stress formula (STRESS TAB) tablet 1 tablet: ORAL | @ 14:00:00

## 2013-09-02 MED ADMIN — 0.9% sodium chloride with KCl 20 mEq/L infusion: INTRAVENOUS | @ 06:00:00 | NDC 00409711509

## 2013-09-02 MED ADMIN — pantoprazole (PROTONIX) injection 40 mg: INTRAVENOUS | @ 01:00:00 | NDC 63323018610

## 2013-09-02 MED ADMIN — magnesium oxide (MAG-OX) tablet 400 mg: ORAL | @ 21:00:00 | NDC 96295013573

## 2013-09-02 MED ADMIN — amlodipine (NORVASC) tablet 5 mg: ORAL | @ 12:00:00 | NDC 68084025911

## 2013-09-02 MED ADMIN — sodium chloride (NS) flush 5-10 mL: INTRAVENOUS | @ 18:00:00 | NDC 87701099893

## 2013-09-02 MED ADMIN — multivitamin, stress formula (STRESS TAB) tablet 1 tablet: ORAL | @ 13:00:00

## 2013-09-02 MED ADMIN — magnesium sulfate 2 g/50 ml IVPB (premix or compounded): INTRAVENOUS | @ 13:00:00 | NDC 00409672924

## 2013-09-02 MED ADMIN — chlordiazePOXIDE (LIBRIUM) capsule 10 mg: ORAL | @ 12:00:00 | NDC 51079037501

## 2013-09-02 MED ADMIN — sodium chloride (NS) flush 5-10 mL: INTRAVENOUS | @ 21:00:00 | NDC 87701099893

## 2013-09-02 MED ADMIN — lisinopril (PRINIVIL, ZESTRIL) tablet 40 mg: ORAL | @ 12:00:00 | NDC 68084019811

## 2013-09-02 MED ADMIN — thiamine (B-1) tablet 100 mg: ORAL | @ 14:00:00 | NDC 90011015050

## 2013-09-02 NOTE — Progress Notes (Addendum)
Reviewing patient chart for spiritual concerns  Good visit with patient  Engaged in conversation freely about his life and his drinking  Talked about riding his bicycle  And his roommate is going to bring his walking stick today  Signed by Farrel Gobble, [facility], staff chaplain

## 2013-09-02 NOTE — Progress Notes (Addendum)
Room air sats=85%.  Pt placed on 3L NC, sats=90-91%     Dr. Arlester Marker notified of chest xray interpretation.  Pt placed on high flow cannula, sats 93-94%.  Will monitor.

## 2013-09-02 NOTE — Progress Notes (Signed)
Pt incont of urine, pt and bedding changed. Urine with strong smell. Pt straight cath,d 900 cc yellow urine out. Specimen sent to lab for U/A. Will cont to monitor.

## 2013-09-02 NOTE — Progress Notes (Signed)
Blood sugar check 111

## 2013-09-02 NOTE — Consults (Addendum)
CONSULT NOTE    Brett Becker    09/02/2013    Date of Admission:  08/28/2013    The patient's chart is reviewed and the patient is discussed with the staff.    Subjective:     Patient is a 52 y.o. Caucasian male seen and evaluated at the request of Dr. Arlester Marker.  Patient was admitted for DTs with a history of heavy ETOH abuse, drug abuse and 3 days of nausea and vomiting.  On the day of admission the patient experienced seizure activity both prior to arrival and also in the ER.  He was admitted to the ICU and began having dark tarry stools which were Heme +.  He also reported a history of hematemesis but stated that it had been some time since his most recent episode.  He was seen by GI and underwent EGD with finding of multiple large gastric ulcers and a non-obstructing Schatzki's ring.  H. Pylori was + and patient was started on PPI,  Clarithromycin and augmentin. He also had elevated LFTs and ultrasound of his liver was obtained with findings suggestive of hepatic steatosis and sludge within the gallbladder.  Patient complained to primary this am that his chest felt "Junky" and CXR was obtained.  It showed "small loculated ptx at L lung base" which appears to have been present on previous CXR.  Patient reports he has had ongoing pain in his L flank for several months with associated sob.  He denies any falls or other injuries to his flank area.  He is not O2 dependent.  He has a 40 pack year history of tobacco abuse and is maintained on ventolin which he uses once daily on average.  We have been asked to see the patient to assist with treatment of his hypoxia and ? Bulla vs trapped lung.      Review of Systems  Constitutional: negative  Eyes: negative  Ears, nose, mouth, throat, and face: negative  Respiratory: positive for cough, sputum, pleurisy/chest pain or sob  Cardiovascular: negative  Gastrointestinal: positive for nausea, vomiting and melena  Genitourinary:negative  Integument/breast:  negative  Hematologic/lymphatic: negative  Musculoskeletal:positive for back pain  Neurological: negative  Behavioral/Psych: positive for ETOH, illegal drug usage and tobacco use  Endocrine: negative    Patient Active Problem List   Diagnosis Code   ??? Delirium tremens 291.0   ??? Macrocytosis 289.89   ??? Hyponatremia 276.1   ??? Hypomagnesemia 275.2   ??? Transaminitis 790.4   ??? Lactic acidosis 276.2   ??? Alcohol abuse 305.00   ??? Nausea and vomiting 787.01   ??? Altered mental status 780.97   ??? Occult blood positive stool 792.1   ??? HTN (hypertension) 401.9   ??? Gastric ulcer 531.90   ??? Hepatitis C 070.70   ??? Marijuana abuse 305.20       Prior to Admission Medications   Outpatient Medications Last Dose Informant Patient Reported? Taking?   naproxen (NAPROSYN) 500 mg tablet   Yes Yes   Sig: Take 500 mg by mouth two (2) times daily (with meals).      Facility-Administered Medications: None       History reviewed. No pertinent past medical history.  Past Surgical History   Procedure Laterality Date   ??? Pr neurological procedure unlisted       History     Social History   ??? Marital Status: MARRIED     Spouse Name: N/A     Number of Children: N/A   ???  Years of Education: N/A     Occupational History   ??? Not on file.     Social History Main Topics   ??? Smoking status: Current Every Day Smoker -- 1.00 packs/day   ??? Smokeless tobacco: Not on file   ??? Alcohol Use: Yes   ??? Drug Use: Not on file   ??? Sexually Active: Not on file     Other Topics Concern   ??? Not on file     Social History Narrative   ??? No narrative on file     History reviewed. No pertinent family history.  No Known Allergies    Current Facility-Administered Medications   Medication Dose Route Frequency   ??? [COMPLETED] magnesium sulfate 4 g/100 ml IVPB  4 g IntraVENous ONCE   ??? [COMPLETED] magnesium sulfate 2 g/50 ml IVPB (premix or compounded)  2 g IntraVENous ONCE   ??? [COMPLETED] potassium chloride (K-DUR, KLOR-CON) SR tablet 40 mEq  40 mEq Oral NOW   ??? chlordiazePOXIDE  (LIBRIUM) capsule 5 mg  5 mg Oral TID   ??? thiamine (B-1) tablet 100 mg  100 mg Oral DAILY   ??? multivitamin, stress formula (STRESS TAB) tablet 1 tablet  1 tablet Oral DAILY   ??? amlodipine (NORVASC) tablet 5 mg  5 mg Oral DAILY   ??? [COMPLETED] magnesium sulfate 2 g/50 ml IVPB (premix or compounded)  2 g IntraVENous ONCE   ??? hydralazine (APRESOLINE) 20 mg/mL injection 20 mg  20 mg IntraVENous Q6H PRN   ??? pantoprazole (PROTONIX) injection 40 mg  40 mg IntraVENous Q12H   ??? magnesium oxide (MAG-OX) tablet 400 mg  400 mg Oral BID   ??? lisinopril (PRINIVIL, ZESTRIL) tablet 40 mg  40 mg Oral DAILY   ??? NUTRITIONAL SUPPORT ELECTROLYTE PRN ORDERS   Does Not Apply PRN   ??? sodium chloride (NS) flush 5-10 mL  5-10 mL IntraVENous Q8H   ??? sodium chloride (NS) flush 5-10 mL  5-10 mL IntraVENous PRN   ??? lorazepam (ATIVAN) injection 2 mg  2 mg IntraVENous Q1H PRN   ??? lorazepam (ATIVAN) injection 1 mg  1 mg IntraVENous Q1H PRN   ??? haloperidol lactate (HALDOL) injection 1 mg  1 mg IntraVENous Q2H PRN   ??? ondansetron (ZOFRAN) injection 4 mg  4 mg IntraVENous Q6H PRN   ??? pneumococcal 23-valent (PNEUMOVAX 23) injection 0.5 mL  0.5 mL IntraMUSCular PRIOR TO DISCHARGE   ??? 0.9% sodium chloride with KCl 20 mEq/L infusion   IntraVENous CONTINUOUS   ??? nicotine (NICODERM CQ) 21 mg/24 hr patch 1 patch  1 patch TransDERmal DAILY         Objective:     Filed Vitals:    09/02/13 0335 09/02/13 0745 09/02/13 1057 09/02/13 1129   BP: 142/83 146/88 110/68    Pulse: 81 90 88    Temp: 98.8 ??F (37.1 ??C) 98.2 ??F (36.8 ??C) 98.1 ??F (36.7 ??C)    Resp: 16 16 16     Height:       Weight:       SpO2: 89% 87% 85% 93%       PHYSICAL EXAM     Constitutional:  the patient is well developed and in no acute distress  EENMT:  Sclera clear, pupils equal, oral mucosa moist  Respiratory: scattered rhonici, O2 at 5 lpm   Cardiovascular:  RRR without M,G,R  Gastrointestinal: soft and non-tender; with positive bowel sounds.  Musculoskeletal: warm without cyanosis. There is no  lower  leg edema.  Mild clubbing  Skin:  no jaundice or rashes, no open wounds   Neurologic: no gross neuro deficits     Psychiatric:  alert and oriented x 3    Chest X-ray:    9/21          Recent Labs      09/01/13   0542  08/31/13   0717   WBC  9.8  7.0   HGB  13.7  12.6*   HCT  38.8*  35.1*   PLT  183  193     Recent Labs      09/02/13   0500  09/01/13   0542  08/31/13   1500  08/31/13   0717   NA  133*  131*   --   131*   K  3.4*  3.7   --   4.1   CL  100  97*   --   100   GLU  110*  87   --   79   CO2  22  17*   --   17*   BUN  5*  7   --   6   CREA  0.73*  0.71*   --   0.72*   MG  1.1*  1.7*  4.1*  1.0*   PHOS  3.8  4.3   --   2.5   CA  8.1*  8.1*   --   8.6       Assessment:  (Medical Decision Making)     Hospital Problems Date Reviewed: 09/02/2013        ICD-9-CM Class Noted POA    Marijuana abuse 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 Bulla vs  Trapped lung L base  CT chest pending      Gastric ulcer 531.90  08/31/2013 Yes    08/30/2013  Findings: Multiple large gastric ulcers - no active bleeding or high risk stigmata.  No biopsy or intervention.  Non-obstructive Schatzki's ring.   H.pylori +    BID PPI therapy.   Avoid gastric irritants.   Plan follow-up EGD in few weeks to assess for healing or biopsy if refractory.   More urgent re-evaluation for increased bleeding, other symptoms        Hepatitis C 070.70  08/31/2013 Yes    Hep C AB +      Occult blood positive stool 792.1  08/30/2013 Yes        HTN (hypertension) 401.9  08/30/2013 Yes        *Delirium tremens 291.0  08/28/2013 Yes    Scheduled librium  MVI  IVF      Macrocytosis 289.89  08/28/2013 Yes        Hyponatremia 276.1  08/28/2013 Yes    Improving  IV NS      Hypomagnesemia 275.2  08/28/2013 Yes    Remains low  Receiving supplementation      Transaminitis 790.4  08/28/2013 Yes        Lactic acidosis  276.2  08/28/2013 Yes        Alcohol abuse 305.00  08/28/2013 Yes        Nausea and vomiting 787.01  08/28/2013 Yes  Altered mental status 780.97  08/28/2013 Yes              Plan:  (Medical Decision Making)     --CT chest to further evaluate - trapped lung vs bulla.  Will review when available  --prn albuterol  --supplemental O2 - wean as tolerated  --Mg++ being supplemented    Thank you very much for this referral.  We appreciate the opportunity to participate in this patient's care.  Will follow along with above stated plan.    Glo Herring, NP    I have spoken with and examined the patient. I agree with the above assessment and plan as documented.    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  His mild clubbing may be due to chronic lung disease, but also raises the (far less likely) possibility of hepatopulmonary syndrome.      Mr. Markowicz drop in O2 sat may be due to the combination of sedating medications for EtOH withdrawal causing alveolar hypoventilation and chronic severe bullous disease. He also could have aspirated and have early pneumonia. We will monitor for obstructive lung disease exacerbation or evidence of aspiration pneumonia.    -- Albuterol nebs prn while hospitalized for obstructive lung disease  -- supplemental O2 for goal 88-92% sat.  -- No fevers or white count or significant wheezing currently, though there are some rhonchi on exam and scattered infiltrates on CT.  Will follow with you daily to clinically assess need for antibiotics/steroids given underlying COPD and high aspiration risk when withdrawing.  -- Follow-up should be arranged for chronic COPD treatment  -- Smoking cessation education    Harriett Rush, MD

## 2013-09-02 NOTE — Progress Notes (Signed)
Hospitalist Progress Note    Subjective:   Daily Progress Note: 09/02/2013 8:37 AM    Mr. Debrosse is a 52 yo WM who has a history of alcohol abuse admitted 9/16 after having a seizure outside of the hospital that occurred again in the ED. No pmh of seizures. He had been having nausea and vomiting for several days prior to admission and had decreased oral intake, including alcohol. He was admitted to the ED for treatment of DTs. Over the past 24 hours he has required prn haldol x 0 and prn ativan x 0 while on scheduled librium. Patient was requiring soft restraints and a sitter at bedside for hallucinations and delirium but his mentation is slowly improving.  He is currently arousable and oriented for me to person but not place with no signs of tremors nor agitation. Course complicated by occult positive stool and patient endorsing hematemesis. EGD performed by GI showing numerous gastric ulcers and is H pylori positive.     No acute events overnight.  Patient states he feels as if his chest is "junky".      Current Facility-Administered Medications   Medication Dose Route Frequency   ??? [COMPLETED] magnesium sulfate 4 g/100 ml IVPB  4 g IntraVENous ONCE   ??? magnesium sulfate 2 g/50 ml IVPB (premix or compounded)  2 g IntraVENous ONCE   ??? potassium chloride (K-DUR, KLOR-CON) SR tablet 40 mEq  40 mEq Oral NOW   ??? chlordiazePOXIDE (LIBRIUM) capsule 5 mg  5 mg Oral TID   ??? amlodipine (NORVASC) tablet 5 mg  5 mg Oral DAILY   ??? [COMPLETED] potassium chloride (K-DUR, KLOR-CON) SR tablet 20 mEq  20 mEq Oral NOW   ??? [COMPLETED] magnesium sulfate 2 g/50 ml IVPB (premix or compounded)  2 g IntraVENous ONCE   ??? hydralazine (APRESOLINE) 20 mg/mL injection 20 mg  20 mg IntraVENous Q6H PRN   ??? pantoprazole (PROTONIX) injection 40 mg  40 mg IntraVENous Q12H   ??? magnesium oxide (MAG-OX) tablet 400 mg  400 mg Oral BID   ??? lisinopril (PRINIVIL, ZESTRIL) tablet 40 mg  40 mg Oral DAILY   ??? NUTRITIONAL SUPPORT ELECTROLYTE PRN ORDERS   Does  Not Apply PRN   ??? sodium chloride (NS) flush 5-10 mL  5-10 mL IntraVENous Q8H   ??? sodium chloride (NS) flush 5-10 mL  5-10 mL IntraVENous PRN   ??? lorazepam (ATIVAN) injection 2 mg  2 mg IntraVENous Q1H PRN   ??? lorazepam (ATIVAN) injection 1 mg  1 mg IntraVENous Q1H PRN   ??? haloperidol lactate (HALDOL) injection 1 mg  1 mg IntraVENous Q2H PRN   ??? ondansetron (ZOFRAN) injection 4 mg  4 mg IntraVENous Q6H PRN   ??? pneumococcal 23-valent (PNEUMOVAX 23) injection 0.5 mL  0.5 mL IntraMUSCular PRIOR TO DISCHARGE   ??? 0.9% sodium chloride 1,000 mL with mvi, adult no.4 with vit K 3,300 unit- 150 mcg/10 mL 10 mL, thiamine 100 mg, folic acid 1 mg infusion   IntraVENous DAILY   ??? 0.9% sodium chloride with KCl 20 mEq/L infusion   IntraVENous CONTINUOUS   ??? nicotine (NICODERM CQ) 21 mg/24 hr patch 1 patch  1 patch TransDERmal DAILY        Review of Systems  Patient endorses sob and chest congestion.  He denies fever, chills, chest pain, weakness, dizziness, palpitations, abdominal pain, diarrhea, constipation, dysuria, flushing.    Objective:     BP 142/83   Pulse 81   Temp(Src) 98.8 ??  F (37.1 ??C)   Resp 16   Ht 5\' 8"  (1.727 m)   Wt 55.2 kg (121 lb 11.1 oz)   BMI 18.51 kg/m2   SpO2 89% O2 Flow Rate (L/min): 0 l/min O2 Device: Room air    Temp (24hrs), Avg:98.3 ??F (36.8 ??C), Min:97.7 ??F (36.5 ??C), Max:98.8 ??F (37.1 ??C)         09/19 1900 - 09/21 0659  In: 3696 [P.O.:360; I.V.:3336]  Out: 900 [Urine:900]    PHYSICAL EXAM:    General:  Awake, alert, oriented, answering all questions appropriately, no acute distress, thin  Eyes:  Non icteric, EOMI  Neck:  Supple  CV:  RRR  PULM:  Course rhonchi R > L; normal effort, non labored  Abd:  Soft, nontender, non distended, active bowel sounds  Skin/ext:  Warm, dry    Additional comments:  All labs, notes and studies from the past 24 hours have been personally reviewed today by me.    Data Review    Recent Results (from the past 24 hour(s))   URINALYSIS W/ RFLX MICROSCOPIC    Collection Time      09/02/13  4:20 AM       Result Value Range    Color YELLOW      Appearance CLEAR      Specific gravity 1.007  1.001 - 1.023      pH (UA) 6.5  5.0 - 9.0      Protein NEGATIVE   NEG mg/dL    Glucose NEGATIVE       Ketone 15 (*) NEG mg/dL    Bilirubin NEGATIVE   NEG      Blood MODERATE (*) NEG      Urobilinogen 1.0  0.2 - 1.0 EU/dL    Nitrites NEGATIVE   NEG      Leukocyte Esterase NEGATIVE   NEG      WBC 0  0 /hpf    RBC 3-5  0 /hpf    Epithelial cells 0  0 /hpf    Bacteria 0  0 /hpf    Casts 0  0 /lpf   METABOLIC PANEL, BASIC    Collection Time     09/02/13  5:00 AM       Result Value Range    Sodium 133 (*) 136 - 145 mmol/L    Potassium 3.4 (*) 3.5 - 5.1 mmol/L    Chloride 100  98 - 107 mmol/L    CO2 22  21 - 32 mmol/L    Anion gap 11  7 - 16 mmol/L    Glucose 110 (*) 65 - 100 mg/dL    BUN 5 (*) 6 - 23 MG/DL    Creatinine 1.61 (*) 0.8 - 1.5 MG/DL    GFR est AA >09  >60 ml/min/1.59m2    GFR est non-AA >60  >60 ml/min/1.8m2    Calcium 8.1 (*) 8.3 - 10.4 MG/DL   MAGNESIUM    Collection Time     09/02/13  5:00 AM       Result Value Range    Magnesium 1.1 (*) 1.8 - 2.4 mg/dL   PHOSPHORUS    Collection Time     09/02/13  5:00 AM       Result Value Range    Phosphorus 3.8  2.5 - 4.5 MG/DL   GLUCOSE, POC    Collection Time     09/02/13  5:22 AM       Result Value Range  Glucose (POC) 111 (*) 65 - 100 mg/dL         Assessment/Plan:     Principal Problem:    Delirium tremens (08/28/2013)    Active Problems:    Macrocytosis (08/28/2013)      Hyponatremia (08/28/2013)      Hypomagnesemia (08/28/2013)      Transaminitis (08/28/2013)      Lactic acidosis (08/28/2013)      Alcohol abuse (08/28/2013)      Nausea and vomiting (08/28/2013)      Altered mental status (08/28/2013)      Occult blood positive stool (08/30/2013)      HTN (hypertension) (08/30/2013)      Gastric ulcer (08/31/2013)      Hepatitis C (08/31/2013)      Marijuana abuse (09/02/2013)    Plan of Care:    DTs due to alcohol withdrawal:  No further seizure activity.  D/C  banana bag, start oral MVI and thiamine.  Required no prn ativan nor haldol, will therefore decrease scheduled librium from 10 to 5 mg TID.      Electrolyte abnormalities:  Aggressively replacing K and Mg.  Will follow up afternoon magnesium level.    Gastric Ulcerations:  H pylori positive.  Needs O-CLAM therapy.  GI following.  Will need repeat EGD as an outpatient.    Hyponatremia:  Improved from 131 to 133 with increase in IVF rate.     Rhonchi:  Checking cxr.        Care Plan discussed with:   The patient and his care team.    Total time spent with patient: 25 minutes.

## 2013-09-03 LAB — URINALYSIS W/ RFLX MICROSCOPIC
Bacteria: 0 /hpf
Bilirubin: NEGATIVE
Epithelial cells: 0 /hpf
Glucose: NEGATIVE mg/dL
Ketone: NEGATIVE mg/dL
Leukocyte Esterase: NEGATIVE
Nitrites: NEGATIVE
Protein: NEGATIVE mg/dL
Urobilinogen: 1 EU/dL (ref 0.2–1.0)
pH (UA): 6.5 (ref 5.0–9.0)

## 2013-09-03 LAB — METABOLIC PANEL, BASIC
Anion gap: 8 mmol/L (ref 7–16)
BUN: 5 MG/DL — ABNORMAL LOW (ref 6–23)
CO2: 22 mmol/L (ref 21–32)
Calcium: 7.5 MG/DL — ABNORMAL LOW (ref 8.3–10.4)
Chloride: 104 mmol/L (ref 98–107)
Creatinine: 0.73 MG/DL — ABNORMAL LOW (ref 0.8–1.5)
GFR est AA: >60 mL/min/{1.73_m2} (ref >60)
GFR est non-AA: >60 mL/min/{1.73_m2} (ref >60)
Glucose: 104 mg/dL — ABNORMAL HIGH (ref 65–100)
Potassium: 4.9 mmol/L (ref 3.5–5.1)
Sodium: 134 mmol/L — ABNORMAL LOW (ref 136–145)

## 2013-09-03 LAB — PHOSPHORUS: Phosphorus: 3.3 MG/DL (ref 2.5–4.5)

## 2013-09-03 LAB — MAGNESIUM
Magnesium: 1.2 mg/dL — CL (ref 1.8–2.4)
Magnesium: 1.6 mg/dL — ABNORMAL LOW (ref 1.8–2.4)

## 2013-09-03 MED ADMIN — magnesium sulfate 4 g/100 ml IVPB: INTRAVENOUS | @ 13:00:00 | NDC 00409672923

## 2013-09-03 MED ADMIN — chlordiazePOXIDE (LIBRIUM) capsule 5 mg: ORAL | @ 02:00:00 | NDC 67544005570

## 2013-09-03 MED ADMIN — magnesium oxide (MAG-OX) tablet 400 mg: ORAL | @ 13:00:00 | NDC 96295013573

## 2013-09-03 MED ADMIN — multivitamin, stress formula (STRESS TAB) tablet 1 tablet: ORAL | @ 13:00:00

## 2013-09-03 MED ADMIN — lisinopril (PRINIVIL, ZESTRIL) tablet 40 mg: ORAL | @ 13:00:00 | NDC 68084019811

## 2013-09-03 MED ADMIN — magnesium oxide (MAG-OX) tablet 400 mg: ORAL | @ 22:00:00 | NDC 96295013573

## 2013-09-03 MED ADMIN — piperacillin-tazobactam (ZOSYN) 4.5 g in 0.9% sodium chloride (MBP/ADV) 100 mL MBP: INTRAVENOUS | @ 18:00:00 | NDC 00338055318

## 2013-09-03 MED ADMIN — barium Sulfate (E-Z-HD) 98 % contrast susp 45 mL: ORAL | @ 14:00:00 | NDC 32909076401

## 2013-09-03 MED ADMIN — chlordiazePOXIDE (LIBRIUM) capsule 5 mg: ORAL | @ 22:00:00 | NDC 51079037401

## 2013-09-03 MED ADMIN — piperacillin-tazobactam (ZOSYN) 4.5 g in 0.9% sodium chloride (MBP/ADV) 100 mL MBP: INTRAVENOUS | @ 10:00:00 | NDC 00338055318

## 2013-09-03 MED ADMIN — thiamine (B-1) tablet 100 mg: ORAL | @ 13:00:00 | NDC 90011015050

## 2013-09-03 MED ADMIN — amlodipine (NORVASC) tablet 5 mg: ORAL | @ 13:00:00 | NDC 68084025911

## 2013-09-03 MED ADMIN — pantoprazole (PROTONIX) injection 40 mg: INTRAVENOUS | @ 01:00:00 | NDC 63323018610

## 2013-09-03 MED ADMIN — sodium chloride (NS) flush 5-10 mL: INTRAVENOUS | @ 10:00:00 | NDC 87701099893

## 2013-09-03 MED ADMIN — chlordiazePOXIDE (LIBRIUM) capsule 5 mg: ORAL | @ 01:00:00 | NDC 51079037401

## 2013-09-03 MED ADMIN — barium sulfate (VARIBAR THIN HONEY) 40 % (w/v) 29% (w/w) contrast suspension 15 mL: ORAL | @ 14:00:00 | NDC 32909081455

## 2013-09-03 MED ADMIN — barium sulfate (VARIBAR PUDDING) 40 % (w/v), 30% (w/w) contrast oral paste 15 mL: ORAL | @ 14:00:00 | NDC 32909012522

## 2013-09-03 MED ADMIN — 0.9% sodium chloride with KCl 20 mEq/L infusion: INTRAVENOUS | @ 19:00:00 | NDC 00409711509

## 2013-09-03 MED ADMIN — albuterol (PROVENTIL VENTOLIN) nebulizer solution 2.5 mg: RESPIRATORY_TRACT | @ 15:00:00 | NDC 00487950101

## 2013-09-03 MED ADMIN — pantoprazole (PROTONIX) injection 40 mg: INTRAVENOUS | @ 13:00:00 | NDC 63323018610

## 2013-09-03 MED ADMIN — barium sulfate (VARIBAR NECTAR) 40 % (w/v) contrast suspension 15 mL: ORAL | @ 14:00:00 | NDC 32909081455

## 2013-09-03 MED ADMIN — tramadol (ULTRAM) tablet 50 mg: ORAL | @ 20:00:00 | NDC 62584055911

## 2013-09-03 MED ADMIN — chlordiazePOXIDE (LIBRIUM) capsule 5 mg: ORAL | @ 13:00:00 | NDC 51079037401

## 2013-09-03 MED ADMIN — acetaminophen (TYLENOL) tablet 500 mg: ORAL | @ 09:00:00 | NDC 00904198861

## 2013-09-03 MED ADMIN — sodium chloride (NS) flush 5-10 mL: INTRAVENOUS | @ 19:00:00 | NDC 87701099893

## 2013-09-03 NOTE — Progress Notes (Signed)
09-03-13  [phone] Brett Becker daughter of the pt. Returned the call.  Daughter concerned over the pt's history of ETOH use.  Learned the pt reside with two friends who provide care as needed and provide transportation as needed.  Provided the pt and his daughter with information on out pt ETOH services as the pt refuses any sort of treatment at this time. Daughter requesting SW to contact her at [phone] when the pt is dc as she will provide transport.  Roseanne Reno, BSW

## 2013-09-03 NOTE — Progress Notes (Signed)
09/03/13 1126   Post-Treatment   Post FEV1 (liters) 0.9 liters   % Predicted .24

## 2013-09-03 NOTE — Progress Notes (Addendum)
Brett Becker  Admission Date: 08/28/2013             Daily Progress Note: 09/03/2013    The patient's chart is reviewed and the patient is discussed with the staff.    Subjective:   Questions about pneumonia. Denies dyspnea or cough. Passed swallow test. Lives with a friend.     Current Facility-Administered Medications   Medication Dose Route Frequency   ??? piperacillin-tazobactam (ZOSYN) 4.5 g in 0.9% sodium chloride (MBP/ADV) 100 mL MBP  4.5 g IntraVENous Q8H   ??? [COMPLETED] magnesium sulfate 4 g/100 ml IVPB  4 g IntraVENous ONCE   ??? [COMPLETED] barium Sulfate (E-Z-HD) 98 % contrast susp 45 mL  45 mL Oral RAD ONCE   ??? barium sulfate (VARIBAR NECTAR) 40 % (w/v) contrast suspension 15 mL  15 mL Oral RAD ONCE   ??? [COMPLETED] barium sulfate (VARIBAR PUDDING) 40 % (w/v), 30% (w/w) contrast oral paste 15 mL  15 mL Oral RAD ONCE   ??? barium sulfate (VARIBAR THIN HONEY) 40 % (w/v) 29% (w/w) contrast suspension 15 mL  15 mL Oral RAD ONCE   ??? chlordiazePOXIDE (LIBRIUM) capsule 5 mg  5 mg Oral BID   ??? albuterol (PROVENTIL VENTOLIN) nebulizer solution 2.5 mg  2.5 mg Nebulization BID RT   ??? thiamine (B-1) tablet 100 mg  100 mg Oral DAILY   ??? multivitamin, stress formula (STRESS TAB) tablet 1 tablet  1 tablet Oral DAILY   ??? albuterol (PROVENTIL VENTOLIN) nebulizer solution 2.5 mg  2.5 mg Nebulization Q6H PRN   ??? [COMPLETED] sodium chloride 0.9 % bolus infusion 100 mL  100 mL IntraVENous RAD ONCE   ??? [COMPLETED] sodium chloride (NS) flush 5-10 mL  5-10 mL IntraVENous RAD ONCE   ??? [COMPLETED] iopamidol (ISOVUE-370) 76 % injection 80 mL  80 mL IntraVENous RAD ONCE   ??? acetaminophen (TYLENOL) tablet 500 mg  500 mg Oral Q4H PRN   ??? tramadol (ULTRAM) tablet 50 mg  50 mg Oral Q6H PRN   ??? amlodipine (NORVASC) tablet 5 mg  5 mg Oral DAILY   ??? hydralazine (APRESOLINE) 20 mg/mL injection 20 mg  20 mg IntraVENous Q6H PRN   ??? pantoprazole (PROTONIX) injection 40 mg  40 mg IntraVENous Q12H   ??? magnesium oxide (MAG-OX) tablet  400 mg  400 mg Oral BID   ??? lisinopril (PRINIVIL, ZESTRIL) tablet 40 mg  40 mg Oral DAILY   ??? NUTRITIONAL SUPPORT ELECTROLYTE PRN ORDERS   Does Not Apply PRN   ??? sodium chloride (NS) flush 5-10 mL  5-10 mL IntraVENous Q8H   ??? sodium chloride (NS) flush 5-10 mL  5-10 mL IntraVENous PRN   ??? lorazepam (ATIVAN) injection 1 mg  1 mg IntraVENous Q1H PRN   ??? haloperidol lactate (HALDOL) injection 1 mg  1 mg IntraVENous Q2H PRN   ??? ondansetron (ZOFRAN) injection 4 mg  4 mg IntraVENous Q6H PRN   ??? pneumococcal 23-valent (PNEUMOVAX 23) injection 0.5 mL  0.5 mL IntraMUSCular PRIOR TO DISCHARGE   ??? 0.9% sodium chloride with KCl 20 mEq/L infusion   IntraVENous CONTINUOUS   ??? nicotine (NICODERM CQ) 21 mg/24 hr patch 1 patch  1 patch TransDERmal DAILY         Objective:     Filed Vitals:    09/02/13 2359 09/03/13 0400 09/03/13 0728 09/03/13 1055   BP:  138/77 130/67    Pulse:  82 70    Temp: 99.3 ??F (37.4 ??C) 100.3 ??F (37.9 ??  C) 98.1 ??F (36.7 ??C)    Resp:  18 18    Height:       Weight:       SpO2:  91% 94% 97%     Intake and Output:   09/20 1900 - 09/22 0659  In: 4961 [P.O.:960; I.V.:4001]  Out: 1600 [Urine:1600]  09/22 0700 - 09/22 1859  In: 1060 [P.O.:480; I.V.:580]  Out: -     Physical Exam:   Constitutional:  the patient is thin, well developed and in no acute distress  HEENT:  Sclera clear, pupils equal, oral mucosa moist  Lungs: clear bilaterally. On nasal cannula.   Cardiovascular:  RRR without M,G,R  Abd/GI: soft and non-tender; with positive bowel sounds.  Ext: warm without cyanosis. There is a trace amount of lower leg edema.  Musculoskeletal: moves all four extremities with equal strength  Skin:  no jaundice or rashes, no wounds   Neuro: no gross neuro deficits   Musculoskeletal: can ambulate but weak. No deformity  Psychiatric:  alert and oriented. Calm.     ROS: Denies pain or dyspnea. Tolerating diet.   Lines: peripheral IV    CHEST XRAY:       LAB  Recent Labs      09/01/13   0542   WBC  9.8   HGB  13.7   HCT  38.8*    PLT  183     Recent Labs      09/03/13   0510  09/02/13   1527  09/02/13   0500  09/01/13   0542   NA  134*   --   133*  131*   K  4.9   --   3.4*  3.7   CL  104   --   100  97*   CO2  22   --   22  17*   GLU  104*   --   110*  87   BUN  5*   --   5*  7   CREA  0.73*   --   0.73*  0.71*   MG  1.2*  2.0  1.1*  1.7*   PHOS  3.3   --   3.8  4.3     Lab Results   Component Value Date/Time    ALT 98 08/28/2013  8:16 AM    AST 177 08/28/2013  8:16 AM    Alk. phosphatase 79 08/28/2013  8:16 AM    Bilirubin, total 1.2 08/28/2013  8:16 AM       Assessment:  (Medical Decision Making)     Hospital Problems Date Reviewed: 09/02/2013        ICD-9-CM Class Noted POA    Aspiration pneumonia 507.0  09/03/2013 No    Bilateral infiltrates on CT. Associated fever and hypoxia    Marijuana abuse 305.20  09/02/2013 Yes    noted    Hypoxia 799.02  09/02/2013 No    Low flow oxygen    Abnormal CXR 793.19  09/02/2013 Clinically Undetermined    Bilateral infiltrates    Gastric ulcer 531.90  08/31/2013 Yes    Noted on recent EGD    Hepatitis C 070.70  08/31/2013 Yes        Occult blood positive stool 792.1  08/30/2013 Yes    Secondary to ulcers    HTN (hypertension) 401.9  08/30/2013 Yes    controlled    *Delirium tremens 291.0  08/28/2013 Yes    resolved  Macrocytosis 289.89  08/28/2013 Yes        Hyponatremia 276.1  08/28/2013 Yes    better    Hypomagnesemia 275.2  08/28/2013 Yes    supplementing    Transaminitis 790.4  08/28/2013 Yes    LFTs as noted    Lactic acidosis 276.2  08/28/2013 Yes    No recent labs    Alcohol abuse 305.00  08/28/2013 Yes    Up until admission    Nausea and vomiting 787.01  08/28/2013 Yes    resolved    Altered mental status 780.97  08/28/2013 Yes    resolved          Plan:  (Medical Decision Making)   1. Day 2 zosyn for suspected aspiration. Passed swallow study today but may have aspirated previously.  Sedatives being weaned. Watch fever. Recent WBC normal. No procalcitonin for review.  Blood cultures neg.   2. Thin liquids per  speech  3. Suspect severe COPD based on CT. Will need outpatient follow up with PFTs  4. Wean oxygen  5. Bronchodilators BID  6. IV protonix - will need repeat EGD per gastroenterology  7. SCD hose - no anticoagulation with GI bleed  8. IV fluids per hospitalist     Terisa Starr, NP    Lungs: no wheezing  Heart:  RRR with no Murmur/Rubs/Gallops    Additional Comments:  Wean O2 and cont. BD. Smoking cessation is critical and discussed with pt    I have spoken with and examined the patient. I agree with the above assessment and plan as documented.    Alverda Skeans, MD

## 2013-09-03 NOTE — Progress Notes (Signed)
Fev1 is .85 LPM, 24%.  Smoking cessation education done with pt.

## 2013-09-03 NOTE — Progress Notes (Signed)
09-03-13  Social Worker consulted for [employer] planning needs, per daughter pt is not able to go back home.  Attempted to discuss with the pt, poor historian. Contacted the pt's daughter at the numbers listed, left a message requesting a return call. Waiting on a return call from the pt's daughter.   Will continue to monitor.  Roseanne Reno, BSW

## 2013-09-03 NOTE — Progress Notes (Signed)
Urine culture obtained via straight cath, immediately received 700 ml of amber clear urine. Specimen sent to lab.

## 2013-09-03 NOTE — Progress Notes (Signed)
Hospitalist Progress Note    Subjective:   Daily Progress Note: 09/03/2013 10:31 AM    Brett Becker is a 52 yo WM who has a history of alcohol abuse admitted 9/16 after having a seizure outside of the hospital that occurred again in the ED. No pmh of seizures. Brett Becker had been having nausea and vomiting for several days prior to admission and had decreased oral intake, including alcohol. Brett Becker was admitted to the ED for treatment of DTs. Over the past 24 hours Brett Becker has required prn haldol x 0 and prn ativan x 0 while on scheduled librium that has been weaned to 5 mg TID.  Brett Becker was requiring soft restraints and a sitter at bedside for hallucinations and delirium but his mentation is slowly improving. Brett Becker is currently arousable and oriented for me to person but not place with no signs of tremors nor agitation. Course complicated by occult positive stool and Brett Becker endorsing hematemesis. EGD performed by GI showing numerous gastric ulcers and is H pylori positive.  Yesterday Brett Becker became sob and desatted into the 80s.  CXR obtained showing possible enlargement of a previously seen PTX.  Brett Becker was placed on 5-6 liters NC and CT Chest obtained showing bulla.  Pulmonology consulted.  Will need outpatient COPD follow up, PFTs, etc.  Spiked temp of 100.6 overnight, night MD pan-cultured Brett Becker and started zosyn as aspiration is likely etiology.    No other acute events overnight.  Continues to work with speech therapy.      Current Facility-Administered Medications   Medication Dose Route Frequency   ??? piperacillin-tazobactam (ZOSYN) 4.5 g in 0.9% sodium chloride (MBP/ADV) 100 mL MBP  4.5 g IntraVENous Q8H   ??? magnesium sulfate 4 g/100 ml IVPB  4 g IntraVENous ONCE   ??? [COMPLETED] barium Sulfate (E-Z-HD) 98 % contrast susp 45 mL  45 mL Oral RAD ONCE   ??? barium sulfate (VARIBAR NECTAR) 40 % (w/v) contrast suspension 15 mL  15 mL Oral RAD ONCE   ??? [COMPLETED] barium sulfate (VARIBAR PUDDING) 40 % (w/v), 30% (w/w) contrast oral paste 15 mL   15 mL Oral RAD ONCE   ??? barium sulfate (VARIBAR THIN HONEY) 40 % (w/v) 29% (w/w) contrast suspension 15 mL  15 mL Oral RAD ONCE   ??? chlordiazePOXIDE (LIBRIUM) capsule 5 mg  5 mg Oral TID   ??? thiamine (B-1) tablet 100 mg  100 mg Oral DAILY   ??? multivitamin, stress formula (STRESS TAB) tablet 1 tablet  1 tablet Oral DAILY   ??? albuterol (PROVENTIL VENTOLIN) nebulizer solution 2.5 mg  2.5 mg Nebulization Q6H PRN   ??? [COMPLETED] sodium chloride 0.9 % bolus infusion 100 mL  100 mL IntraVENous RAD ONCE   ??? [COMPLETED] sodium chloride (NS) flush 5-10 mL  5-10 mL IntraVENous RAD ONCE   ??? [COMPLETED] iopamidol (ISOVUE-370) 76 % injection 80 mL  80 mL IntraVENous RAD ONCE   ??? acetaminophen (TYLENOL) tablet 500 mg  500 mg Oral Q4H PRN   ??? tramadol (ULTRAM) tablet 50 mg  50 mg Oral Q6H PRN   ??? amlodipine (NORVASC) tablet 5 mg  5 mg Oral DAILY   ??? hydralazine (APRESOLINE) 20 mg/mL injection 20 mg  20 mg IntraVENous Q6H PRN   ??? pantoprazole (PROTONIX) injection 40 mg  40 mg IntraVENous Q12H   ??? magnesium oxide (MAG-OX) tablet 400 mg  400 mg Oral BID   ??? lisinopril (PRINIVIL, ZESTRIL) tablet 40 mg  40 mg Oral DAILY   ???  NUTRITIONAL SUPPORT ELECTROLYTE PRN ORDERS   Does Not Apply PRN   ??? sodium chloride (NS) flush 5-10 mL  5-10 mL IntraVENous Q8H   ??? sodium chloride (NS) flush 5-10 mL  5-10 mL IntraVENous PRN   ??? lorazepam (ATIVAN) injection 2 mg  2 mg IntraVENous Q1H PRN   ??? lorazepam (ATIVAN) injection 1 mg  1 mg IntraVENous Q1H PRN   ??? haloperidol lactate (HALDOL) injection 1 mg  1 mg IntraVENous Q2H PRN   ??? ondansetron (ZOFRAN) injection 4 mg  4 mg IntraVENous Q6H PRN   ??? pneumococcal 23-valent (PNEUMOVAX 23) injection 0.5 mL  0.5 mL IntraMUSCular PRIOR TO DISCHARGE   ??? 0.9% sodium chloride with KCl 20 mEq/L infusion   IntraVENous CONTINUOUS   ??? nicotine (NICODERM CQ) 21 mg/24 hr patch 1 patch  1 patch TransDERmal DAILY        Review of Systems  Brett Becker endorses wheezing, diarrhea and a cough.  Brett Becker denies tremor, weakness,  lethargy, dysphagia, chest pain, sob, palpitations, abdominal pain, constipation, melena, dysuria, hematuria.    Objective:     BP 130/67   Pulse 70   Temp(Src) 98.1 ??F (36.7 ??C)   Resp 18   Ht 5\' 8"  (1.727 m)   Wt 55.2 kg (121 lb 11.1 oz)   BMI 18.51 kg/m2   SpO2 94% O2 Flow Rate (L/min): 2 l/min O2 Device: Nasal cannula    Temp (24hrs), Avg:99.2 ??F (37.3 ??C), Min:98.1 ??F (36.7 ??C), Max:100.6 ??F (38.1 ??C)      09/22 0700 - 09/22 1859  In: 1060 [P.O.:480; I.V.:580]  Out: -   09/20 1900 - 09/22 0659  In: 4961 [P.O.:960; I.V.:4001]  Out: 1600 [Urine:1600]    PHYSICAL EXAM:    General:  Awake, alert, oriented, receiving breathing treatment  Eyes:  Non icteric, EOMI  Neck:  Supple  CV:  RRR  PULM: course breath sounds in anterior fields with expiratory wheezing, non labored, no accessory muscle use  Abd:  Soft, nontender, non distended, active bowel sounds  Skin/ext:  Warm, dry, no lower ext edema, no hand tremor    Additional comments:  All labs, notes and studies from the past 24 hours have been personally reviewed today by me.    Data Review    Recent Results (from the past 24 hour(s))   MAGNESIUM    Collection Time     09/02/13  3:27 PM       Result Value Range    Magnesium 2.0  1.8 - 2.4 mg/dL   CULTURE, URINE    Collection Time     09/03/13  4:30 AM       Result Value Range    Special Requests: NO SPECIAL REQUESTS      Culture result:        Value: NO GROWTH AFTER SHORT PERIOD OF INCUBATION. FURTHER RESULTS TO FOLLOW AFTER OVERNIGHT INCUBATION.   URINALYSIS W/ RFLX MICROSCOPIC    Collection Time     09/03/13  4:40 AM       Result Value Range    Color YELLOW      Appearance CLEAR      pH (UA) 6.5  5.0 - 9.0      Protein NEGATIVE   NEG mg/dL    Glucose NEGATIVE       Ketone NEGATIVE   NEG mg/dL    Bilirubin NEGATIVE   NEG      Blood TRACE (*) NEG      Urobilinogen  1.0  0.2 - 1.0 EU/dL    Nitrites NEGATIVE   NEG      Leukocyte Esterase NEGATIVE   NEG      WBC 0-3  0 /hpf    RBC 5-10  0 /hpf    Epithelial cells 0  0  /hpf    Bacteria 0  0 /hpf    Casts 0-3  0 /lpf   MAGNESIUM    Collection Time     09/03/13  5:10 AM       Result Value Range    Magnesium 1.2 (*) 1.8 - 2.4 mg/dL   METABOLIC PANEL, BASIC    Collection Time     09/03/13  5:10 AM       Result Value Range    Sodium 134 (*) 136 - 145 mmol/L    Potassium 4.9  3.5 - 5.1 mmol/L    Chloride 104  98 - 107 mmol/L    CO2 22  21 - 32 mmol/L    Anion gap 8  7 - 16 mmol/L    Glucose 104 (*) 65 - 100 mg/dL    BUN 5 (*) 6 - 23 MG/DL    Creatinine 1.61 (*) 0.8 - 1.5 MG/DL    GFR est AA >09  >60 ml/min/1.39m2    GFR est non-AA >60  >60 ml/min/1.55m2    Calcium 7.5 (*) 8.3 - 10.4 MG/DL   PHOSPHORUS    Collection Time     09/03/13  5:10 AM       Result Value Range    Phosphorus 3.3  2.5 - 4.5 MG/DL         Assessment/Plan:     Principal Problem:    Delirium tremens (08/28/2013)    Active Problems:    Macrocytosis (08/28/2013)      Hyponatremia (08/28/2013)      Hypomagnesemia (08/28/2013)      Transaminitis (08/28/2013)      Lactic acidosis (08/28/2013)      Alcohol abuse (08/28/2013)      Nausea and vomiting (08/28/2013)      Altered mental status (08/28/2013)      Occult blood positive stool (08/30/2013)      HTN (hypertension) (08/30/2013)      Gastric ulcer (08/31/2013)      Hepatitis C (08/31/2013)      Marijuana abuse (09/02/2013)      Hypoxia (09/02/2013)      Abnormal CXR (09/02/2013)    Plan of Care    DTs due to alcohol withdrawal: No further seizure activity. Status post banana bags, on oral MVI and thiamine. Required no prn ativan nor haldol, will therefore decrease scheduled librium from 5 TID to 5 mg BID    Electrolyte abnormalities: Aggressively replacing K and Mg. Will follow up afternoon magnesium level.     Gastric Ulcerations: H pylori positive. Needs O-CLAM therapy after treatment for aspiration pna. GI following. Will need repeat EGD as an outpatient.     Hyponatremia: Improved from 131 to 134 with increase in IVF rate.     Hypoxia:  On oxygen to keep o2 sat 88-92%.  Will need  COPD workup, PFTs, etc.    Fever:  Pan-cxed, urinalysis shows no infection, on zosyn for aspiration pneumonia.  Endorsing diarrhea, will check c diff.      Care Plan discussed with:   Brett Becker and his care team, pulm    Total time spent with Brett Becker: 25 minutes.

## 2013-09-03 NOTE — Progress Notes (Signed)
Pt alert and oriented x4 during entire shift. Noted diminished lung sounds and wheezes bilaterally in lower lobes. Incentive Spirometer provided.  Teaching on how to use and necessity of use given.  Pt verbalizes understanding for need for cessation of smoking and ETOH consumption . However, readiness to make necessary changes is still questionable with patient asking, "If I quit smoking, is it okay if I have a beer sometimes?" Spoke with daughter, Reyden Smith.  She verbalizes concern for her father's lack of desire to quit drinking and requested information concerning alcohol abuse treatment.  Transferred call to SW who spoke with her as stated in SW notes. Need stool sample for C-Dif culture.

## 2013-09-03 NOTE — Progress Notes (Addendum)
Problem: Mobility Impaired (Adult and Pediatric)  Goal: *Acute Goals and Plan of Care (Insert Text)  LTG:  (1.)Brett Becker will move from supine to sit and sit to supine , scoot up and down and roll side to side with INDEPENDENCE within 7 day(s).   (2.)Brett Becker will transfer from bed to chair and chair to bed with INDEPENDENT using the least restrictive device within 7 day(s).   (3.)Brett Becker will ambulate with MODIFIED INDEPENDENCE for 450 feet with the least restrictive device within 7 day(s).   (4.)Brett Becker will navigate 4 steps with or without handrail use with SUPERVISION with the least restrictive device within 7 days.   ________________________________________________________________________________________________  PHYSICAL THERAPY: INITIAL ASSESSMENT AND AM  INPATIENT: Medicaid : Hospital Day: 7    NAME/AGE/GENDER: Brett Becker is a 52 y.o. male  DATE: 09/03/2013  PRIMARY DIAGNOSIS: Delirium tremens  Delirium tremens  heme positive stool; anemia  Delirium tremens  Delirium tremens  Procedure(s) (LRB):  ESOPHAGOGASTRODUODENOSCOPY (EGD) (N/A)  4 Days Post-Op  Treatment Diagnosis: 787.23  INTERDISCIPLINARY COLLABORATION: Physical Therapist, Registered Nurse and Physical Therapist Student  ASSESSMENT:   Brett Becker presents supine in bed and is agreeable to therapy. Pt transfers from supine to seated at edge of bed. PT examination and evaluation completed as stated in this note. Pt transfers from seated at edge of bed to standing. Pt is noted to have impaired static standing balance. Pt ambulates 250' with front wheeled walker Minimal Assist with verbal cues required to maintain proper body posture and breathing mechanics. Pt denies pain, shortness of breath, or dizziness during ambulation and afterwards. Following ambulation, pt returns to room and remains seated at the edge of bed, call bell and telephone within reach, tray placed in front of patient, tab alarm activated. Pt denies further needs at this time. Pt  demonstrates continued need for skilled PT due to deficits including but not limited to decreased strength, balance, transfer ability, and activity tolerance.       ????????This section established at most recent assessment??????????  PROBLEM LIST (Impairments causing functional limitations):  1. Decreased Strength affecting function  2. Decreased ADL/Functional Activities  3. Decreased Transfer Abilities  4. Decreased Ambulation Ability/Technique  5. Decreased Balance  6. Decreased Activity Tolerance  REHABILITATION POTENTIAL FOR STATED GOALS: GOOD      PLAN OF CARE:   INTERVENTIONS PLANNED: (Benefits and precautions of physical therapy have been discussed with the patient.)  1. balance exercise  2. bed mobility  3. family education  4. gait training  5. home exercise program (HEP)  6. therapeutic activities  7. therapeutic exercise/strengthening  8. Stair climbing activities  FREQUENCY/DURATION: Follow patient 1-2 times per day/4-7 days per week until goals are met in order to address above goals.    RECOMMENDED REHABILITATION/EQUIPMENT: (at time of discharge pending progress):   Home Health: Physical Therapy.  SUBJECTIVE:   "I'm glad you guys showed up."    Present Symptoms: No new complaints    Pain Intensity 1: 0  History of Present Injury/Illness: Per H&P: "History of Present Illness: Patient is a 52 y.o. male without significant past medical history who is seen in consultation at the request of Dr. Arlester Marker for heme positive stool and hematemesis. The patient was admitted to ICU on 08/28/13 after feeling poorly over the past few days with decreased oral intake (including alcohol) and having a witnessed seizure in the ER. While in ICU, the patient had multiple Hohler-black stools that came to be hemoccult positive and  the patient reported having an episode of hematemesis in the past, not recently. Hgb has dropped 2 grams since admission. LFTs elevated on admission. The patient denies any current nausea, vomiting,  or abdominal pain. He admits to occasional loose, black stools at home. He admits to a 10-15 lb weight loss. He has some reflux, but isn't able to describe to what frequency/ severity. Takes NSAIDs occasionally for headaches. Denies family history of GI malignancy. No prior colonoscopy or EGD.   He states he drinks a 6 pack per day, but does admit that these are the "big beers". He also drinks liquor sometimes and smokes marijuana. Denies other drug use. He denies any prior diagnosis of hepatitis C. He has one tattoo, that he gave himself "just playing" with the equipment many years ago. He denies any prior liver disease."    Prior Level of Function/Home Situation: Pt states that he got around "fairly good" prior to admission. Pt states that he would use a walking stick when going on longer walks or in unfamiliar areas. Pt also states that he would use a bicycle to get around. Pt lives in an apartment with a roommate. Pt states that his roommate has visited him while he has been in the hospital and will ask him to bring his walking stick next time he speaks with him.   Home Environment: Apartment  # Steps to Enter: 5  Rails to Enter: Yes  Hand Rails : Bilateral  Wheelchair Ramp: No  One/Two Story Residence: One story  Living Alone: No (Has roommate that has visited this patient in the hospital)  Support Systems: Child(ren);Friends \\ neighbors  Patient Expects to be Discharged to:: Apartment  Current DME Used/Available at Home: Other (comment) (Walking stick)  OBJECTIVE/TREATMENT:   (In addition to Assessment/Re-Assessment sessions the following treatments were rendered)                                      [facility]??? ???6 Clicks???                                          Basic Mobility Inpatient Short Form  How much difficulty does the patient currently have... Unable A Lot A Little None   1.  Turning over in bed (including adjusting bedclothes, sheets and blankets)?   [ ]  1   [ ]  2   [ ]  3   [X]  4   2.   Sitting down on and standing up from a chair with arms ( e.g., wheelchair, bedside commode, etc.)   [ ]  1   [ ]  2   [X]  3   [ ]  4   3.  Moving from lying on back to sitting on the side of the bed?   [ ]  1   [ ]  2   [X]  3   [ ]  4               How much help from another person does the patient currently need... Total A Lot A Little None   4.  Moving to and from a bed to a chair (including a wheelchair)?   [ ]  1   [ ]  2   [X]  3   [ ]  4   5.  Need to walk in  hospital room?   [ ]  1   [ ]  2   [X]  3   [ ]  4   6.  Climbing 3-5 steps with a railing?   [ ]  1   [X]  2   [ ]  3   [ ]  4   ?? 2007, Trustees of [address], under license to [place] [place]. All rights reserved       Score:  Initial: 18 Most Recent: 18 (Date: 09/03/13 )   Interpretation of Tool:  Represents activities that are increasingly more difficult (i.e. Bed mobility, Transfers, Gait).  Score 24 23 22-20 19-15 14-10 9-7 6   Modifier CH CI CJ CK CL CM CN       ?? Mobility - Walking and Moving Around:              [phone] - CURRENT STATUS:        CK - 40%-59% impaired, limited or restricted              [id number] - GOAL STATUS:                CJ - 20%-39% impaired, limited or restricted              [id number] - D/C STATUS:                    ---------------To be determined---------------  [place] [employer] [facility] [place]  [place] [employer] [facility] [facility] [place]  Product Type: Managed Care Medicaid     Most Recent Physical Functioning:   Gross Assessment:  AROM: Within functional limits  Strength: Generally decreased, functional (LE grossly 4-/5)  Coordination: Generally decreased, functional (finger to nose slightly delayed speed bilaterally.)  Sensation: Intact  Posture:  Posture (WDL): Exceptions to WDL  Posture Assessment: Forward head;Rounded shoulders;Trunk flexion  Balance:  Sitting: Impaired;Without support  Sitting - Static: Good (unsupported);Prop sitting  Sitting - Dynamic: Fair (occasional)  Standing: Impaired  Standing - Static: Fair  Standing - Dynamic : Poor  Bed Mobility:   Rolling: Additional time;Modified independence, requires equipment  Supine to Sit: Additional time;Supervision  Sit to Supine: Additional time;Supervision  Scooting: Additional time;Supervision  Wheelchair Mobility:     Transfers:  Sit to Stand: Supervision;Verbal cues  Stand to Sit: Verbal cues;Supervision  Gait:     Base of Support: Narrowed  Speed/Cadence: Pace decreased (<100 feet/min)  Step Length: Left shortened;Right shortened  Distance (ft): 250 Feet (ft)  Assistive Device: Walker, rolling  Ambulation - Level of Assistance: Minimal assistance      Assessment/Reassessment only, no treatment provided today    Braces/Orthotics/Lines/Etc:   ?? O2 Device: Nasal cannula  Safety:   After treatment position/precautions:  ?? Bed alarm/tab alert on  ?? Bed/Chair-wheels locked  ?? Bed in low position  ?? Call light within reach  ?? RN notified  ?? Seated at edge of bed with tray placed in front of patient. Pt instructed to use call bell if he needs to get up, pt states understanding.  Progression/Medical Necessity:   ?? Patient demonstrates good rehab potential due to higher previous functional level.  Compliance with Program/Exercises: compliant all of the time.   Reason for Continuation of Services/Other Comments:  ?? Patient continues to require present interventions due to patient's inability to transfer, ambulate, and complete other functional mobility activities which are appropriate for homegoing at an acceptable level of independence. .  Recommendations/Intent for next treatment session: Treatment next visit will focus on advancements  to more challenging activities and reduction in assistance provided.  Total Treatment Duration:  Time In: 1124  Time Out: 1142  Donette Larry

## 2013-09-03 NOTE — Progress Notes (Signed)
Pt has had uneventful shift. Pt has remained A&O and has required no PRN meds this shift. Pt has been voiding well and denies needs at this time.

## 2013-09-03 NOTE — Progress Notes (Addendum)
Problem: Dysphagia (Adult)  Goal: *Acute Goals and Plan of Care (Insert Text)  STG: Patient will safely swallow regular textures and thin liquids without overt signs or symptoms of aspiration 90% of the time. GOAL MODIFIED 09/03/13  STG: Patient will participate in Modified Barium Swallow study x1. GOAL MET 09/03/13  STG: Patient will perform laryngeal strengthening exercises x10 each with 80% accuracy.  LTG: Patient will consume least restrictive diet without respiratory compromise 100% of the time by discharge.     Speech language pathology: modified barium swallow study: Initial Assessment    NAME/AGE/GENDER: Brett Becker is a 52 y.o. male  DATE: 09/03/2013  PRIMARY DIAGNOSIS: Delirium tremens  Delirium tremens  heme positive stool; anemia  Procedure(s) (LRB):  ESOPHAGOGASTRODUODENOSCOPY (EGD) (N/A) 4 Days Post-Op  Treatment Diagnosis: 787.23  INTERDISCIPLINARY COLLABORATION: Radiologist  PRECAUTIONS/ALLERGIES: Review of patient's allergies indicates no known allergies. ASSESSMENT/PLAN OF CARE:Based on the objective data described below, Brett Becker presents with premature spillage of all textures to valleculae and then pyriform sinuses prior to swallow, but no laryngeal penetration or aspiration observed during swallow with any consistency. Minimal to moderate residue in valleculae after swallow that clears with liquid rinse or double swallow. Recommend regular texture diet and thin liquids. Patient should be upright at 90 degrees for all PO intake. Patient will benefit from skilled intervention to address the below impairments.????????This section established at most recent assessment??????????  RECOMMENDATIONS AND PLANNED INTERVENTIONS (Benefits and precautions of therapy have been discussed with the patient.):  ?? PO:  Regular  ?? Liquids:  regular thin  MEDICATIONS:  ?? With liquid  COMPENSATORY STRATEGIES/MODIFICATIONS INCLUDING:  ?? Alternate liquids/solids  ?? Double swallow  ?? Upright for all PO  OTHER  RECOMMENDATIONS (including follow up treatment recommendations):   ?? Tongue based expressions  ?? Family training/education  ?? Laryngeal exercises  ?? Patient education  FREQUENCY/DURATION: Continue to follow patient 3-5 times a week for duration of hospital stay to address above goals.SUBJECTIVE:   "I think I'm getting better every day."  History of Present Injury/Illness: Brett Becker  has no past medical history on file.  He also  has past surgical history that includes neurological procedure unlisted and back surgery.   Present Symptoms: intermittent coughing during bedside swallow eval   Pain Intensity 1: 0    Current Dietary Status:  Mechanical soft, nectar-thick liquids     History of reflux:  YES        Social History/Home Situation:    Home Environment: Other (comment) (duplex)  # Steps to Enter: 4  One/Two Story Residence: One story  Living Alone: No  Support Systems: Child(ren);Family member(s);Friends \\ neighbors;Significant other  Patient Expects to be Discharged to:: Trailer/mobile home  Current DME Used/Available at Home: None  OBJECTIVE:   Barium Lot Number: [id number]    Cognitive/Communication Status:  Mental Status  Neurologic State: Alert  Orientation Level: Oriented X4  Cognition: Appropriate decision making;Appropriate for age attention/concentration;Appropriate safety awareness;Follows commands  Perception: Appears intact  Perseveration: No perseveration noted  Safety/Judgement: Awareness of environment;Insight into deficits    Oral Assessment:  Oral Assessment  Labial: No impairment  Dentition: Limited;Natural  Oral Hygiene: adequate  Lingual: No impairment    Vocal Quality: low volume, hypernasal    Patient Viewed: Patient Position: upright in chair  Film Views: Lateral;Fluoro    Oral Prepatory:  The patient was given the following: Consistency Presented: Thin liquid;Pudding;Mixed consistency;Solid  How Presented: Self-fed/presented;SLP-fed/presented;Cup/sip;Cup/gulp;Spoon;Straw;Successive  swallows    Oral Phase:  Bolus Acceptance:  No impairment  Bolus Formation/Control: Impaired  Propulsion: No impairment  Type of Impairment: Premature spillage  Oral Residue: None  Initiation of Swallow: Triggered at pyriform sinus(es)  Oral Phase Severity: Minimal    Pharyngeal Phase:  Timing: Pooling 1-5 sec;Pooling 6-10 sec;Pyriform sinus;Vallecular  Decreased Tongue Base Retraction?: Yes  Laryngeal Elevation: WFL (within functional limits)  Penetration: None  Aspiration/Timing: No evidence of aspiration  Aspiration/Penetration Score: 1 (No penetration or aspiration-Contrast does not enter the airway)  Pharyngeal Symmetry: Not assessed  Pharyngeal Dysfunction: Decreased pharyngeal wall constriction;Decreased tongue base retraction  Pharyngeal Phase Severity: Minimal  Pharyngeal-Esophageal Segment: No impairment    Assessment/Reassessment only, no treatment provided today    Tool Used: Dysphagia Outcome and Severity Scale (DOSS)    Score Comments   Normal Diet  []  7 With no strategies or extra time needed       Functional Swallow  [x]  6 May have mild oral or pharyngeal delay       Mild Dysphagia    []  5 Which may require one diet consistency restricted (those who demonstrate penetration which is entirely cleared on MBS would be included)   Mild-Moderate Dysphagia  []  4 With 1-2 diet consistencies restricted       Moderate Dysphagia  []  3 With 2 or more diet consistencies restricted       Moderately Severe Dysphagia  []  2 With partial PO strategies (trials with ST only)       Severe Dysphagia  []  1 With inability to tolerate any PO safely          Score:  Initial: 4 Most Recent: 6 (Date: 09/03/13 )   Interpretation of Tool: The Dysphagia Outcome and Severity Scale (DOSS) is a simple, easy-to-use, 7-point scale developed to systematically rate the functional severity of dysphagia based on objective assessment and make recommendations for diet level, independence level, and type of nutrition.     Score 7 6 5 4 3 2 1     Modifier CH CI CJ CK CL CM CN     Swallowing:    W2956 - CURRENT STATUS: CI - 1%-19% impaired, limited or restricted   O1308 - GOAL STATUS:  CH - 0% impaired, limited or restricted   M5784 - D/C STATUS:  ---------------To be determined---------------  [place] [facility] [place]  [place] [facility] [place]  Product Type: Managed Care Medicaid    __________________________________________________________________________________________________  Safety:   After treatment position/precautions:  ?? Patient transferred to bed in radiology holding bay and waiting for transport back to room.  ?? Results and recommendations called to floor. Spoke with Kendal Hymen, RN  Recommendations for treatment: laryngeal exercises, training in safe swallowing strategies  Total Treatment Duration:  Time In: 0940   Time Out: 1010    Ailene Ards, [place], CCC-SLP

## 2013-09-03 NOTE — Progress Notes (Signed)
MD notified of new fever of 100.6. Orders received to "start Zosyn 4.5 Q8, blood cultures x2, Urine culture, and UA". With verbal readback and confirmation.

## 2013-09-04 ENCOUNTER — Telehealth (INDEPENDENT_AMBULATORY_CARE_PROVIDER_SITE_OTHER): Payer: Self-pay | Admitting: Urology

## 2013-09-04 ENCOUNTER — Encounter (INDEPENDENT_AMBULATORY_CARE_PROVIDER_SITE_OTHER): Payer: PPO | Admitting: Family Medicine

## 2013-09-04 LAB — METABOLIC PANEL, BASIC
Anion gap: 9 mmol/L (ref 7–16)
BUN: 5 MG/DL — ABNORMAL LOW (ref 6–23)
CO2: 24 mmol/L (ref 21–32)
Calcium: 8 MG/DL — ABNORMAL LOW (ref 8.3–10.4)
Chloride: 101 mmol/L (ref 98–107)
Creatinine: 0.75 MG/DL — ABNORMAL LOW (ref 0.8–1.5)
GFR est AA: >60 mL/min/{1.73_m2} (ref >60)
GFR est non-AA: >60 mL/min/{1.73_m2} (ref >60)
Glucose: 95 mg/dL (ref 65–100)
Potassium: 3.6 mmol/L (ref 3.5–5.1)
Sodium: 134 mmol/L — ABNORMAL LOW (ref 136–145)

## 2013-09-04 LAB — CBC WITH AUTOMATED DIFF
ABS. BASOPHILS: 0 10*3/uL (ref 0.0–0.2)
ABS. EOSINOPHILS: 0.3 10*3/uL (ref 0.0–0.8)
ABS. IMM. GRANS.: 0 10*3/uL (ref 0.0–0.5)
ABS. LYMPHOCYTES: 1 10*3/uL (ref 0.5–4.6)
ABS. MONOCYTES: 1 10*3/uL (ref 0.1–1.3)
ABS. NEUTROPHILS: 3.6 10*3/uL (ref 1.7–8.2)
BASOPHILS: 0 % (ref 0.0–2.0)
EOSINOPHILS: 4 % (ref 0.5–7.8)
HCT: 30.5 % — ABNORMAL LOW (ref 41.1–50.3)
HGB: 10.6 g/dL — ABNORMAL LOW (ref 13.6–17.2)
IMMATURE GRANULOCYTES: 0.2 % (ref 0.0–5.0)
LYMPHOCYTES: 18 % (ref 13–44)
MCH: 33.9 PG — ABNORMAL HIGH (ref 26.1–32.9)
MCHC: 34.8 g/dL (ref 31.4–35.0)
MCV: 97.4 FL (ref 79.6–97.8)
MONOCYTES: 17 % — ABNORMAL HIGH (ref 4.0–12.0)
MPV: 9.2 FL — ABNORMAL LOW (ref 10.8–14.1)
NEUTROPHILS: 61 % (ref 43–78)
PLATELET: 230 10*3/uL (ref 150–450)
RBC: 3.13 M/uL — ABNORMAL LOW (ref 4.23–5.67)
RDW: 12.4 % (ref 11.9–14.6)
WBC: 5.9 10*3/uL (ref 4.3–11.1)

## 2013-09-04 LAB — MAGNESIUM: Magnesium: 1.3 mg/dL — CL (ref 1.8–2.4)

## 2013-09-04 LAB — PHOSPHORUS: Phosphorus: 4.1 MG/DL (ref 2.5–4.5)

## 2013-09-04 MED ADMIN — thiamine (B-1) tablet 100 mg: ORAL | @ 13:00:00 | NDC 90011015050

## 2013-09-04 MED ADMIN — amlodipine (NORVASC) tablet 5 mg: ORAL | @ 13:00:00 | NDC 68084025911

## 2013-09-04 MED ADMIN — albuterol (PROVENTIL VENTOLIN) nebulizer solution 2.5 mg: RESPIRATORY_TRACT | @ 12:00:00 | NDC 00487950101

## 2013-09-04 MED ADMIN — pantoprazole (PROTONIX) injection 40 mg: INTRAVENOUS | @ 02:00:00 | NDC 63323018610

## 2013-09-04 MED ADMIN — sodium chloride (NS) flush 5-10 mL: INTRAVENOUS | @ 02:00:00 | NDC 87701099893

## 2013-09-04 MED ADMIN — piperacillin-tazobactam (ZOSYN) 4.5 g in 0.9% sodium chloride (MBP/ADV) 100 mL MBP: INTRAVENOUS | @ 19:00:00 | NDC 00338055318

## 2013-09-04 MED ADMIN — magnesium oxide (MAG-OX) tablet 800 mg: ORAL | @ 13:00:00 | NDC 96295013573

## 2013-09-04 MED ADMIN — tramadol (ULTRAM) tablet 50 mg: ORAL | @ 12:00:00 | NDC 62584055911

## 2013-09-04 MED ADMIN — magnesium oxide (MAG-OX) tablet 800 mg: ORAL | @ 22:00:00 | NDC 96295013573

## 2013-09-04 MED ADMIN — pantoprazole (PROTONIX) injection 40 mg: INTRAVENOUS | @ 13:00:00 | NDC 63323018610

## 2013-09-04 MED ADMIN — 0.9% sodium chloride with KCl 20 mEq/L infusion: INTRAVENOUS | @ 06:00:00 | NDC 00409711509

## 2013-09-04 MED ADMIN — chlordiazePOXIDE (LIBRIUM) capsule 5 mg: ORAL | @ 22:00:00 | NDC 51079037401

## 2013-09-04 MED ADMIN — chlordiazePOXIDE (LIBRIUM) capsule 5 mg: ORAL | @ 13:00:00 | NDC 51079037401

## 2013-09-04 MED ADMIN — piperacillin-tazobactam (ZOSYN) 4.5 g in 0.9% sodium chloride (MBP/ADV) 100 mL MBP: INTRAVENOUS | @ 09:00:00 | NDC 00338055318

## 2013-09-04 MED ADMIN — albuterol (PROVENTIL VENTOLIN) nebulizer solution 2.5 mg: RESPIRATORY_TRACT | NDC 00487950101

## 2013-09-04 MED ADMIN — piperacillin-tazobactam (ZOSYN) 4.5 g in 0.9% sodium chloride (MBP/ADV) 100 mL MBP: INTRAVENOUS | @ 02:00:00 | NDC 00338055318

## 2013-09-04 MED ADMIN — docusate sodium (COLACE) capsule 100 mg: ORAL | @ 15:00:00 | NDC 62584068311

## 2013-09-04 MED ADMIN — multivitamin, stress formula (STRESS TAB) tablet 1 tablet: ORAL | @ 13:00:00

## 2013-09-04 MED ADMIN — docusate sodium (COLACE) capsule 100 mg: ORAL | @ 22:00:00 | NDC 62584068311

## 2013-09-04 MED ADMIN — 0.9% sodium chloride with KCl 20 mEq/L infusion: INTRAVENOUS | @ 16:00:00 | NDC 00409711509

## 2013-09-04 MED ADMIN — lisinopril (PRINIVIL, ZESTRIL) tablet 40 mg: ORAL | @ 13:00:00 | NDC 68084019811

## 2013-09-04 NOTE — Telephone Encounter (Signed)
Routing call to [place]. Called pt and told him I did so.    Closing telephone encounter.

## 2013-09-04 NOTE — Telephone Encounter (Signed)
CONFIRMED PHONE NUMBER: [phone]  CALLERS FIRST AND LAST NAME: Ina Kick  FACILITY NAME: na TITLE: na  CALLERS RELATIONSHIP:Self  RETURN CALL: Detailed message on voicemail only     SUBJECT: General Message   REASON FOR REQUEST: Patient is calling for lab results from appointment on 9/17.    MESSAGE: Please follow up with patient.

## 2013-09-04 NOTE — Progress Notes (Signed)
Pt alert and oriented x4 during entire shift. Ambulated halls with PT several times. Urinal moved out of room; pt ambulating to BR with assistance although still very unsteady. Verbalizes and demonstrates knowledge of need for assistance when ambulating. After SLT worked with pt, diet changed to mechanical soft with nectar thick liquids - straws removed from room as requested from SLT. RT removed O2 this morning as sat was at 94% on room air - instructions to continue to monitor received. At 1500, O2 sat = 89% on room air, reapplied O2 @ 2L via NC, O2 at 98% at 1735. Productive cough  with wheezing still noted in lower right lobe. Continue with incentive spirometry, ambulation, and PO fluids. Pt states he feels like he needs to have a BM but can't. Orders received for Colace.

## 2013-09-04 NOTE — Progress Notes (Addendum)
Brett Becker  Admission Date: 08/28/2013             Daily Progress Note: 09/04/2013    The patient's chart is reviewed and the patient is discussed with the staff.    Subjective:   Denies dyspnea or pain. Waiting to walk with PT. Tells me nurse needs stool sample.     Current Facility-Administered Medications   Medication Dose Route Frequency   ??? magnesium oxide (MAG-OX) tablet 800 mg  800 mg Oral BID   ??? piperacillin-tazobactam (ZOSYN) 4.5 g in 0.9% sodium chloride (MBP/ADV) 100 mL MBP  4.5 g IntraVENous Q8H   ??? [COMPLETED] magnesium sulfate 4 g/100 ml IVPB  4 g IntraVENous ONCE   ??? [COMPLETED] barium Sulfate (E-Z-HD) 98 % contrast susp 45 mL  45 mL Oral RAD ONCE   ??? [EXPIRED] barium sulfate (VARIBAR NECTAR) 40 % (w/v) contrast suspension 15 mL  15 mL Oral RAD ONCE   ??? [COMPLETED] barium sulfate (VARIBAR PUDDING) 40 % (w/v), 30% (w/w) contrast oral paste 15 mL  15 mL Oral RAD ONCE   ??? [EXPIRED] barium sulfate (VARIBAR THIN HONEY) 40 % (w/v) 29% (w/w) contrast suspension 15 mL  15 mL Oral RAD ONCE   ??? chlordiazePOXIDE (LIBRIUM) capsule 5 mg  5 mg Oral BID   ??? albuterol (PROVENTIL VENTOLIN) nebulizer solution 2.5 mg  2.5 mg Nebulization BID RT   ??? thiamine (B-1) tablet 100 mg  100 mg Oral DAILY   ??? multivitamin, stress formula (STRESS TAB) tablet 1 tablet  1 tablet Oral DAILY   ??? albuterol (PROVENTIL VENTOLIN) nebulizer solution 2.5 mg  2.5 mg Nebulization Q6H PRN   ??? acetaminophen (TYLENOL) tablet 500 mg  500 mg Oral Q4H PRN   ??? tramadol (ULTRAM) tablet 50 mg  50 mg Oral Q6H PRN   ??? amlodipine (NORVASC) tablet 5 mg  5 mg Oral DAILY   ??? hydralazine (APRESOLINE) 20 mg/mL injection 20 mg  20 mg IntraVENous Q6H PRN   ??? pantoprazole (PROTONIX) injection 40 mg  40 mg IntraVENous Q12H   ??? lisinopril (PRINIVIL, ZESTRIL) tablet 40 mg  40 mg Oral DAILY   ??? NUTRITIONAL SUPPORT ELECTROLYTE PRN ORDERS   Does Not Apply PRN   ??? sodium chloride (NS) flush 5-10 mL  5-10 mL IntraVENous Q8H   ??? sodium chloride (NS)  flush 5-10 mL  5-10 mL IntraVENous PRN   ??? lorazepam (ATIVAN) injection 1 mg  1 mg IntraVENous Q1H PRN   ??? haloperidol lactate (HALDOL) injection 1 mg  1 mg IntraVENous Q2H PRN   ??? ondansetron (ZOFRAN) injection 4 mg  4 mg IntraVENous Q6H PRN   ??? pneumococcal 23-valent (PNEUMOVAX 23) injection 0.5 mL  0.5 mL IntraMUSCular PRIOR TO DISCHARGE   ??? 0.9% sodium chloride with KCl 20 mEq/L infusion   IntraVENous CONTINUOUS   ??? nicotine (NICODERM CQ) 21 mg/24 hr patch 1 patch  1 patch TransDERmal DAILY         Objective:     Filed Vitals:    09/03/13 2258 09/04/13 0421 09/04/13 0735 09/04/13 0740   BP: 115/74 107/59 111/75    Pulse: 84 73 72    Temp: 98.9 ??F (37.2 ??C) 98.6 ??F (37 ??C) 97.9 ??F (36.6 ??C)    Resp: 18 18 18     Height:       Weight:       SpO2: 92% 100% 93% 97%     Intake and Output:   09/21 1900 - 09/23 1610  In: 4414 [P.O.:480; I.V.:3934]  Out: 3000 [Urine:3000]       Physical Exam:   Constitutional:  the patient is thin, well developed and in no acute distress  HEENT:  Sclera clear, pupils equal, oral mucosa moist  Lungs: clear bilaterally. Wearing nasal cannula around neck. Room air sat 90% at rest.  Cardiovascular:  RRR without M,G,R  Abd/GI: soft and non-tender; with positive bowel sounds.  Ext: warm without cyanosis. There is no lower leg edema.  Musculoskeletal: moves all four extremities with equal strength  Skin:  no jaundice or rashes, no wounds   Neuro: no gross neuro deficits   Musculoskeletal: can ambulate. No deformity  Psychiatric:  alert and oriented. Calm.     ROS: denies pain, no dyspnea, occassional cough, weak  Lines: peripheral IV    CHEST XRAY: none today    LAB  Recent Labs      09/04/13   0442   WBC  5.9   HGB  10.6*   HCT  30.5*   PLT  230     Recent Labs      09/04/13   0442  09/03/13   1625  09/03/13   0510   09/02/13   0500   NA  134*   --   134*   --   133*   K  3.6   --   4.9   --   3.4*   CL  101   --   104   --   100   CO2  24   --   22   --   22   GLU  95   --   104*   --   110*    BUN  5*   --   5*   --   5*   CREA  0.75*   --   0.73*   --   0.73*   MG  1.3*  1.6*  1.2*   < >  1.1*   PHOS  4.1   --   3.3   --   3.8    < > = values in this interval not displayed.         Assessment:  (Medical Decision Making)     Hospital Problems Date Reviewed: 09/02/2013        ICD-9-CM Class Noted POA    Aspiration pneumonia 507.0  09/03/2013 No    Bilateral infiltrates on CT. Recent fever, hypoxia    Marijuana abuse (Chronic) 305.20  09/02/2013 Yes    noted    Hypoxia 799.02  09/02/2013 No    Room air sat today 90% with rest    Abnormal CXR 793.19  09/02/2013 No    Suspected pneumonia    Gastric ulcer 531.90  08/31/2013 Yes    Per recent EGD    Hepatitis C (Chronic) 070.70  08/31/2013 Yes    noted    Occult blood positive stool 792.1  08/30/2013 Yes    Associated with ulcer    HTN (hypertension) 401.9  08/30/2013 Yes    Controlled. Was not on outpatient medication    *Delirium tremens 291.0  08/28/2013 Yes    resolved    Macrocytosis 289.89  08/28/2013 Yes    resolved    Hyponatremia 276.1  08/28/2013 Yes    Improved, stable    Hypomagnesemia 275.2  08/28/2013 Yes    Still low    Transaminitis 790.4  08/28/2013 Yes    Better  and associated with below    Alcohol abuse (Chronic) 305.00  08/28/2013 Yes              Plan:  (Medical Decision Making)   1. Day 3 zosyn. t max now around 99 - 100.3 yesterday morning. Will monitor. Nl WBC, no procalcitonin for review. Blood cultures neg  2. Check room air sat - wean oxygen as tolerated  3. Supplementing magnesium  4. PT seeing  5. IV Protonix - repeat EGD planned by gastroenterology later  6. Now on thin liquids - monitor for signs of aspiration  7. IV fluids per hospitalist. Na better, stable    Terisa Starr, NP    Lungs: wheezing in the right chest.   Heart S1 and S2 audible, no murmers or rubs appreciated    Noted to cough up his pill when he tilted his head back and then got into a coughing fit. Self resolved and then told not to tilt head back when drinking liquids and  much better now, no cough even.    I have spoken with and examined the patient. I have reviewed the history, examination, assessment, and plan and agree with the above.    Bradi Arbuthnot Mcarthur Rossetti, MD

## 2013-09-04 NOTE — Progress Notes (Signed)
Hospitalist Progress Note    Subjective:   Daily Progress Note: 09/04/2013 10:31 AM    Brett Becker is a 52 yo WM who has a history of alcohol abuse admitted 9/16 after having a seizure outside of the hospital that occurred again in the ED. No pmh of seizures. He had been having nausea and vomiting for several days prior to admission and had decreased oral intake, including alcohol. He was admitted to the ED for treatment of DTs. Over the past 24 hours he has required prn haldol x 0 and prn ativan x 0 while on scheduled librium that has been weaned to 5 mg TID.  Patient was requiring soft restraints and a sitter at bedside for hallucinations and delirium but his mentation is slowly improving. He is currently arousable and oriented for me to person but not place with no signs of tremors nor agitation. Course complicated by occult positive stool and patient endorsing hematemesis. EGD performed by GI showing numerous gastric ulcers and is H pylori positive.  Yesterday he became sob and desatted into the 80s.  CXR obtained showing possible enlargement of a previously seen PTX.  He was placed on 5-6 liters NC and CT Chest obtained showing bulla.  Pulmonology consulted.  Will need outpatient COPD follow up, PFTs, etc.  Spiked temp of 100.6 overnight, night MD pan-cultured patient and started zosyn as aspiration is likely etiology.    No other acute events overnight.  Continues to work with speech therapy. Off O2 completely      Current Facility-Administered Medications   Medication Dose Route Frequency   ??? magnesium oxide (MAG-OX) tablet 800 mg  800 mg Oral BID   ??? docusate sodium (COLACE) capsule 100 mg  100 mg Oral BID   ??? piperacillin-tazobactam (ZOSYN) 4.5 g in 0.9% sodium chloride (MBP/ADV) 100 mL MBP  4.5 g IntraVENous Q8H   ??? [EXPIRED] barium sulfate (VARIBAR NECTAR) 40 % (w/v) contrast suspension 15 mL  15 mL Oral RAD ONCE   ??? [EXPIRED] barium sulfate (VARIBAR THIN HONEY) 40 % (w/v) 29% (w/w) contrast suspension 15  mL  15 mL Oral RAD ONCE   ??? chlordiazePOXIDE (LIBRIUM) capsule 5 mg  5 mg Oral BID   ??? albuterol (PROVENTIL VENTOLIN) nebulizer solution 2.5 mg  2.5 mg Nebulization BID RT   ??? thiamine (B-1) tablet 100 mg  100 mg Oral DAILY   ??? multivitamin, stress formula (STRESS TAB) tablet 1 tablet  1 tablet Oral DAILY   ??? albuterol (PROVENTIL VENTOLIN) nebulizer solution 2.5 mg  2.5 mg Nebulization Q6H PRN   ??? acetaminophen (TYLENOL) tablet 500 mg  500 mg Oral Q4H PRN   ??? tramadol (ULTRAM) tablet 50 mg  50 mg Oral Q6H PRN   ??? amlodipine (NORVASC) tablet 5 mg  5 mg Oral DAILY   ??? hydralazine (APRESOLINE) 20 mg/mL injection 20 mg  20 mg IntraVENous Q6H PRN   ??? pantoprazole (PROTONIX) injection 40 mg  40 mg IntraVENous Q12H   ??? lisinopril (PRINIVIL, ZESTRIL) tablet 40 mg  40 mg Oral DAILY   ??? NUTRITIONAL SUPPORT ELECTROLYTE PRN ORDERS   Does Not Apply PRN   ??? sodium chloride (NS) flush 5-10 mL  5-10 mL IntraVENous Q8H   ??? sodium chloride (NS) flush 5-10 mL  5-10 mL IntraVENous PRN   ??? lorazepam (ATIVAN) injection 1 mg  1 mg IntraVENous Q1H PRN   ??? haloperidol lactate (HALDOL) injection 1 mg  1 mg IntraVENous Q2H PRN   ??? ondansetron (ZOFRAN)  injection 4 mg  4 mg IntraVENous Q6H PRN   ??? pneumococcal 23-valent (PNEUMOVAX 23) injection 0.5 mL  0.5 mL IntraMUSCular PRIOR TO DISCHARGE   ??? 0.9% sodium chloride with KCl 20 mEq/L infusion   IntraVENous CONTINUOUS   ??? nicotine (NICODERM CQ) 21 mg/24 hr patch 1 patch  1 patch TransDERmal DAILY        Review of Systems  Patient endorses wheezing, diarrhea and a cough.  He denies tremor, weakness, lethargy, dysphagia, chest pain, sob, palpitations, abdominal pain, constipation, melena, dysuria, hematuria.    Objective:     BP 114/72   Pulse 75   Temp(Src) 98.8 ??F (37.1 ??C)   Resp 18   Ht 5\' 8"  (1.727 m)   Wt 55.2 kg (121 lb 11.1 oz)   BMI 18.51 kg/m2   SpO2 89% O2 Flow Rate (L/min): 2 l/min O2 Device: Nasal cannula    Temp (24hrs), Avg:98.5 ??F (36.9 ??C), Min:97.9 ??F (36.6 ??C), Max:99 ??F (37.2  ??C)      09/23 0700 - 09/23 1859  In: 1149 [P.O.:240; I.V.:909]  Out: -   09/21 1900 - 09/23 0659  In: 4414 [P.O.:480; I.V.:3934]  Out: 3000 [Urine:3000]    PHYSICAL EXAM:    General:  Awake, alert, oriented, mild distress  Eyes:  Non icteric, EOMI  Neck:  Supple  CV:  RRR  PULM: diminished bibasilar  Abd:  Soft, nontender, non distended, active bowel sounds  Skin/ext:  Warm, dry, no lower ext edema, no hand tremor    Additional comments:  All labs, notes and studies from the past 24 hours have been personally reviewed today by me.    Data Review    Recent Results (from the past 24 hour(s))   CBC WITH AUTOMATED DIFF    Collection Time     09/04/13  4:42 AM       Result Value Range    WBC 5.9  4.3 - 11.1 K/uL    RBC 3.13 (*) 4.23 - 5.67 M/uL    HGB 10.6 (*) 13.6 - 17.2 g/dL    HCT 81.1 (*) 91.4 - 50.3 %    MCV 97.4  79.6 - 97.8 FL    MCH 33.9 (*) 26.1 - 32.9 PG    MCHC 34.8  31.4 - 35.0 g/dL    RDW 78.2  95.6 - 21.3 %    PLATELET 230  150 - 450 K/uL    MPV 9.2 (*) 10.8 - 14.1 FL    DF AUTOMATED      NEUTROPHILS 61  43 - 78 %    LYMPHOCYTES 18  13 - 44 %    MONOCYTES 17 (*) 4.0 - 12.0 %    EOSINOPHILS 4  0.5 - 7.8 %    BASOPHILS 0  0.0 - 2.0 %    IMMATURE GRANULOCYTES 0.2  0.0 - 5.0 %    ABS. NEUTROPHILS 3.6  1.7 - 8.2 K/UL    ABS. LYMPHOCYTES 1.0  0.5 - 4.6 K/UL    ABS. MONOCYTES 1.0  0.1 - 1.3 K/UL    ABS. EOSINOPHILS 0.3  0.0 - 0.8 K/UL    ABS. BASOPHILS 0.0  0.0 - 0.2 K/UL    ABS. IMM. GRANS. 0.0  0.0 - 0.5 K/UL   METABOLIC PANEL, BASIC    Collection Time     09/04/13  4:42 AM       Result Value Range    Sodium 134 (*) 136 - 145 mmol/L  Potassium 3.6  3.5 - 5.1 mmol/L    Chloride 101  98 - 107 mmol/L    CO2 24  21 - 32 mmol/L    Anion gap 9  7 - 16 mmol/L    Glucose 95  65 - 100 mg/dL    BUN 5 (*) 6 - 23 MG/DL    Creatinine 4.09 (*) 0.8 - 1.5 MG/DL    GFR est AA >81  >19 ml/min/1.36m2    GFR est non-AA >60  >60 ml/min/1.85m2    Calcium 8.0 (*) 8.3 - 10.4 MG/DL   PHOSPHORUS    Collection Time     09/04/13  4:42  AM       Result Value Range    Phosphorus 4.1  2.5 - 4.5 MG/DL   MAGNESIUM    Collection Time     09/04/13  4:42 AM       Result Value Range    Magnesium 1.3 (*) 1.8 - 2.4 mg/dL         Assessment/Plan:     Principal Problem:    Delirium tremens (08/28/2013)    Active Problems:    Macrocytosis (08/28/2013)      Hyponatremia (08/28/2013)      Hypomagnesemia (08/28/2013)      Transaminitis (08/28/2013)      Alcohol abuse (08/28/2013)      Occult blood positive stool (08/30/2013)      HTN (hypertension) (08/30/2013)      Gastric ulcer (08/31/2013)      Hepatitis C (08/31/2013)      Marijuana abuse (09/02/2013)      Hypoxia (09/02/2013)      Abnormal CXR (09/02/2013)      Aspiration pneumonia (09/03/2013)    Plan of Care    DTs due to alcohol withdrawal: No further seizure activity. Status post banana bags, on oral MVI and thiamine. Required no prn ativan nor haldol, was therefore decrease scheduled librium     Electrolyte abnormalities: Aggressively replacing K and Mg. To follow     Gastric Ulcerations: H pylori positive. Needs O-CLAM therapy after treatment for aspiration pna. GI following. Will need repeat EGD as an outpatient.     Hyponatremia: Improved from 131 to 134 with increase in IVF rate.     Hypoxia:  On oxygen to keep o2 sat 88-92%.  Will need COPD workup, PFTs, etc.    Fever:  Pan-cxed, urinalysis shows no infection, on zosyn for aspiration pneumonia.  Endorsing diarrhea, will check c diff. Has hx of COPD and off O2 now. Hid blood Cs w/ GPC is 1/4 bottles and pt is afebrile and doing better, more likely contaminent      Care Plan discussed with:   Patient    Total time spent with patient: 30 minutes.

## 2013-09-04 NOTE — Progress Notes (Addendum)
Problem: Dysphagia (Adult)  Goal: *Acute Goals and Plan of Care (Insert Text)  STG: Patient will safely swallow regular textures and nectar liquids by cup without overt signs or symptoms of aspiration 90% of the time. GOAL MODIFIED 09/04/13  STG: Patient will participate in Modified Barium Swallow study x1. GOAL MET 09/03/13  STG: Patient will perform laryngeal strengthening exercises x10 each with 80% accuracy.  LTG: Patient will consume least restrictive diet without respiratory compromise 100% of the time by discharge.     Speech language pathology: BEDSIDE swallow study: Daily Note 1    NAME/AGE/GENDER: Brett Becker is a 52 y.o. male  DATE: 09/04/2013  PRIMARY DIAGNOSIS: Delirium tremens  Delirium tremens  heme positive stool; anemia  Procedure(s) (LRB):  ESOPHAGOGASTRODUODENOSCOPY (EGD) (N/A) 5 Days Post-Op  Treatment Diagnosis: 787.23  INTERDISCIPLINARY COLLABORATION: Radiologist  PRECAUTIONS/ALLERGIES: Review of patient's allergies indicates no known allergies. ASSESSMENT/PLAN OF CARE:Patient seen for diet tolerance with po trials provided by SLP and then a portion of his lunch.  Sitting upright in the chair.  Tolerated initial trials of thin liquids without overt signs/sx of aspiration.  Half way through eating the cracker the patient presented with strong coughing episode.  Stated that due to limited teeth he was unable to chew it thoroughly and it "got caught".  Runny nose and watery eyes following the incident.  Tolerated several trials of mechanical soft textures and mixed consistencies without overt signs/sx of aspiration.  The patient then presented with another strong coughing episode when drinking the liquid portion of his peaches from the cup following solid trials.  Tolerated nectar liquids by cup without signs/sx of aspiration; cough x1 with consecutive sips via straw only.  Despite no penetration/aspiration on recent modified barium swallow study based on signs/sx with initial assessment and today's  assessment feel pt is likely intermittently aspirating.  Patient also with tendency to eat several bites before taking a drink.  Not consistently following recommendations to alternate solids/liquids to clear residue without constant cues.  Recommend downgrade diet to mechanical soft/nectar liquids by cup.  No mixed consistencies.  RN and pt aware of recommendations.    ????????This section established at most recent assessment??????????  RECOMMENDATIONS AND PLANNED INTERVENTIONS (Benefits and precautions of therapy have been discussed with the patient.):  ?? PO:  Mechanical soft with chopped meat and vegetables  ?? Liquids:  nectar  MEDICATIONS:  ?? With liquid  COMPENSATORY STRATEGIES/MODIFICATIONS INCLUDING:  ?? Alternate liquids/solids  ?? Double swallow  ?? Upright for all PO  OTHER RECOMMENDATIONS (including follow up treatment recommendations):   ?? Tongue based expressions  ?? Family training/education  ?? Laryngeal exercises  ?? Patient education  FREQUENCY/DURATION: Continue to follow patient 3-5 times a week for duration of hospital stay to address above goals.SUBJECTIVE:   "That just went down the wrong way." following coughing episode.  History of Present Injury/Illness: Brett Becker  has no past medical history on file.  He also  has past surgical history that includes neurological procedure unlisted and back surgery.   Present Symptoms: intermittent coughing during bedside swallow eval   Pain Intensity 1: 0  Pain Location 1: Shoulder  Pain Intervention(s) 1: Medication (see MAR)    Current Dietary Status:  Mechanical soft, nectar-thick liquids     History of reflux:  YES        Social History/Home Situation:    Home Environment: Apartment  # Steps to Enter: 5  Rails to Enter: Yes  Hand Rails : Bilateral  Wheelchair Ramp:  No  One/Two Story Residence: One story  Living Alone: No (Has roommate that has visited this patient in the hospital)  Support Systems: Child(ren);Friends \\ neighbors  Patient Expects to be  Discharged to:: Apartment  Current DME Used/Available at Home: Other (comment) (Walking stick)  OBJECTIVE:       Cognitive/Communication Status:  Mental Status  Neurologic State: Alert  Orientation Level: Appropriate for age;Oriented X4  Cognition: Follows commands  Perception: Appears intact  Perseveration: No perseveration noted  Safety/Judgement: Awareness of environment    Oral Assessment:  Oral Assessment  Labial: No impairment  Dentition: Limited;Natural  Oral Hygiene: adequate  Lingual: No impairment    Vocal Quality: low volume, hypernasal  Oral Assessment:  Oral Assessment  Labial: No impairment  Dentition: Limited;Natural  Lingual: No impairment    P.O. Trials:  Patient Position: upright in bed  The patient was given teaspoon to straw amounts of the following:   Consistency Presented: Solid;Thin liquid;Nectar thick liquid;Mixed consistency  How Presented: Self-fed/presented;Cup/sip;Straw;Spoon;Cup/gulp    ORAL PHASE:  Bolus Acceptance: No impairment  Bolus Formation/Control: Impaired  Propulsion: Delayed (# of seconds)  Type of Impairment: Delayed;Mastication  Oral Residue: None    PHARYNGEAL PHASE:  Initiation of Swallow: Delayed (# of seconds)  Laryngeal Elevation: Functional  Aspiration Signs/Symptoms: Strong cough;Runny nose;Watery eyes  Vocal Quality: No impairment          Dysphagia Activities: Activities/Procedures listed utilized to improve progress in swallow timeliness, swallow function, diet tolerance and swallow safety. Required moderate cueing to improve swallow safety, work toward diet advancement and decrease aspiration risk.    Tool Used: Dysphagia Outcome and Severity Scale (DOSS)    Score Comments   Normal Diet  []  7 With no strategies or extra time needed       Functional Swallow  []  6 May have mild oral or pharyngeal delay       Mild Dysphagia    []  5 Which may require one diet consistency restricted (those who demonstrate penetration which is entirely cleared on MBS would be included)    Mild-Moderate Dysphagia  [x]  4 With 1-2 diet consistencies restricted       Moderate Dysphagia  []  3 With 2 or more diet consistencies restricted       Moderately Severe Dysphagia  []  2 With partial PO strategies (trials with ST only)       Severe Dysphagia  []  1 With inability to tolerate any PO safely          Score:  Initial: 4 Most Recent: 4 (Date: 09/04/13 )   Interpretation of Tool: The Dysphagia Outcome and Severity Scale (DOSS) is a simple, easy-to-use, 7-point scale developed to systematically rate the functional severity of dysphagia based on objective assessment and make recommendations for diet level, independence level, and type of nutrition.     Score 7 6 5 4 3 2 1    Modifier CH CI CJ CK CL CM CN     Swallowing:    X9147 - CURRENT STATUS: CK - 40%-59% impaired, limited or restricted   W2956 - GOAL STATUS:  CI - 1%-19% impaired, limited or restricted   O1308 - D/C STATUS:  ---------------To be determined---------------  [place] [employer] [facility] [facility] [place]  [place] [facility] [place]  Product Type: Managed Care Medicaid    __________________________________________________________________________________________________  Safety:   After treatment position/precautions:  ?? Patient upright in chair eating his lunch  ?? Results and recommendations discussed with RN Tammy  Recommendations for  treatment: laryngeal exercises, training in safe swallowing strategies  Total Treatment Duration:  Time In: 1152   Time Out: 1228    Reginia Naas MS, CCC-SLP

## 2013-09-04 NOTE — Progress Notes (Signed)
Problem: Mobility Impaired (Adult and Pediatric)  Goal: *Acute Goals and Plan of Care (Insert Text)  LTG:  (1.)Mr. Malerba will move from supine to sit and sit to supine , scoot up and down and roll side to side with INDEPENDENCE within 7 day(s).   (2.)Mr. Wehner will transfer from bed to chair and chair to bed with INDEPENDENT using the least restrictive device within 7 day(s).   (3.)Mr. Berberian will ambulate with MODIFIED INDEPENDENCE for 450 feet with the least restrictive device within 7 day(s).   (4.)Mr. Siebert will navigate 4 steps with or without handrail use with SUPERVISION with the least restrictive device within 7 days.   ________________________________________________________________________________________________  PHYSICAL THERAPY: Daily Note, Treatment Day: 1st and AM  INPATIENT: Medicaid : Hospital Day: 8    NAME/AGE/GENDER: Antoneo Ghrist is a 52 y.o. male  DATE: 09/04/2013  PRIMARY DIAGNOSIS: Delirium tremens  Delirium tremens  heme positive stool; anemia  Delirium tremens  Delirium tremens  Procedure(s) (LRB):  ESOPHAGOGASTRODUODENOSCOPY (EGD) (N/A)  5 Days Post-Op  Treatment Diagnosis: 787.23  INTERDISCIPLINARY COLLABORATION: Physical Therapy Assistant and Registered Nurse  ASSESSMENT:   Mr. Rabenold presents sitting up in chair and is very agreeable to therapy.  Pt stood with vc for correct hand placement and then ambulated about 1000' with RW and CGA/SBA, pt then ambulated about 250' with HHA and CGA.  Pt required verbal cues to maintain proper body posture and breathing mechanics.  Pt returned to room, rested a couple of minutes and then performed the below exercises.  Pt left sitting in chair, RN aware, and tab alert on.  Pt making great progress with mobility and balance. Pt demonstrates continued need for skilled PT due to deficits including but not limited to decreased strength, balance, transfer ability, and activity tolerance.       ????????This section established at most recent  assessment??????????  PROBLEM LIST (Impairments causing functional limitations):  1. Decreased Strength affecting function  2. Decreased ADL/Functional Activities  3. Decreased Transfer Abilities  4. Decreased Ambulation Ability/Technique  5. Decreased Balance  6. Decreased Activity Tolerance  REHABILITATION POTENTIAL FOR STATED GOALS: GOOD      PLAN OF CARE:   INTERVENTIONS PLANNED: (Benefits and precautions of physical therapy have been discussed with the patient.)  1. balance exercise  2. bed mobility  3. family education  4. gait training  5. home exercise program (HEP)  6. therapeutic activities  7. therapeutic exercise/strengthening  8. Stair climbing activities  FREQUENCY/DURATION: Follow patient 1-2 times per day/4-7 days per week until goals are met in order to address above goals.    RECOMMENDED REHABILITATION/EQUIPMENT: (at time of discharge pending progress):   Home Health: Physical Therapy.  SUBJECTIVE:   "I'm glad you showed up, I'm walking out of here as soon as they'll let me."    Present Symptoms: No new complaints    Pain Intensity 1: 2  Pain Location 1: Shoulder  Pain Intervention(s) 1: Medication (see MAR)  History of Present Injury/Illness: Per H&P: "History of Present Illness: Patient is a 52 y.o. male without significant past medical history who is seen in consultation at the request of Dr. Arlester Marker for heme positive stool and hematemesis. The patient was admitted to ICU on 08/28/13 after feeling poorly over the past few days with decreased oral intake (including alcohol) and having a witnessed seizure in the ER. While in ICU, the patient had multiple [place]-black stools that came to be hemoccult positive and the patient reported having an episode  of hematemesis in the past, not recently. Hgb has dropped 2 grams since admission. LFTs elevated on admission. The patient denies any current nausea, vomiting, or abdominal pain. He admits to occasional loose, black stools at home. He admits to a 10-15  lb weight loss. He has some reflux, but isn't able to describe to what frequency/ severity. Takes NSAIDs occasionally for headaches. Denies family history of GI malignancy. No prior colonoscopy or EGD.   He states he drinks a 6 pack per day, but does admit that these are the "big beers". He also drinks liquor sometimes and smokes marijuana. Denies other drug use. He denies any prior diagnosis of hepatitis C. He has one tattoo, that he gave himself "just playing" with the equipment many years ago. He denies any prior liver disease."    Prior Level of Function/Home Situation: Pt states that he got around "fairly good" prior to admission. Pt states that he would use a walking stick when going on longer walks or in unfamiliar areas. Pt also states that he would use a bicycle to get around. Pt lives in an apartment with a roommate. Pt states that his roommate has visited him while he has been in the hospital and will ask him to bring his walking stick next time he speaks with him.   Home Environment: Apartment  # Steps to Enter: 5  Rails to Enter: Yes  Hand Rails : Bilateral  Wheelchair Ramp: No  One/Two Story Residence: One story  Living Alone: No (Has roommate that has visited this patient in the hospital)  Support Systems: Child(ren);Friends \\ neighbors  Patient Expects to be Discharged to:: Apartment  Current DME Used/Available at Home: Other (comment) (Walking stick)  OBJECTIVE/TREATMENT:   (In addition to Assessment/Re-Assessment sessions the following treatments were rendered)                                      [facility]??? ???6 Clicks???                                          Basic Mobility Inpatient Short Form  How much difficulty does the patient currently have... Unable A Lot A Little None   1.  Turning over in bed (including adjusting bedclothes, sheets and blankets)?   [ ]  1   [ ]  2   [ ]  3   [X]  4   2.  Sitting down on and standing up from a chair with arms ( e.g., wheelchair, bedside commode,  etc.)   [ ]  1   [ ]  2   [X]  3   [ ]  4   3.  Moving from lying on back to sitting on the side of the bed?   [ ]  1   [ ]  2   [X]  3   [ ]  4               How much help from another person does the patient currently need... Total A Lot A Little None   4.  Moving to and from a bed to a chair (including a wheelchair)?   [ ]  1   [ ]  2   [X]  3   [ ]  4   5.  Need to walk in hospital room?   [ ]   1   [ ]  2   [X]  3   [ ]  4   6.  Climbing 3-5 steps with a railing?   [ ]  1   [X]  2   [ ]  3   [ ]  4   ?? 2007, Trustees of [address], under license to [employer] [place]. All rights reserved       Score:  Initial: 18 Most Recent: 18 (Date: 09/03/13 )   Interpretation of Tool:  Represents activities that are increasingly more difficult (i.e. Bed mobility, Transfers, Gait).  Score 24 23 22-20 19-15 14-10 9-7 6   Modifier CH CI CJ CK CL CM CN       ?? Mobility - Walking and Moving Around:              [phone] - CURRENT STATUS:        CK - 40%-59% impaired, limited or restricted              [id number] - GOAL STATUS:                CJ - 20%-39% impaired, limited or restricted              U0454 - D/C STATUS:                    ---------------To be determined---------------  [place] [employer] [facility] [facility] [place]  [place] [facility] [place]  Product Type: Managed Care Medicaid     Most Recent Physical Functioning:   Gross Assessment:  AROM: Within functional limits  Strength: Generally decreased, functional (LE grossly 4-/5)  Coordination: Generally decreased, functional (finger to nose slightly delayed speed bilaterally.)  Sensation: Intact  Posture:  Posture (WDL): Exceptions to WDL  Posture Assessment: Forward head;Rounded shoulders;Trunk flexion  Balance:  Sitting - Static: Good (unsupported)  Sitting - Dynamic: Good (unsupported)  Standing - Static: Fair (+)  Standing - Dynamic : Fair (+)  Bed Mobility:     Wheelchair Mobility:     Transfers:  Sit to Stand: Supervision;Verbal cues  Stand to Sit: Modified independence, requires equipment  Gait:     Base  of Support: Narrowed  Speed/Cadence: Pace decreased (<100 feet/min)  Step Length: Left shortened;Right shortened  Distance (ft): 1250 Feet (ft)  Assistive Device: Walker, rolling (for 1000', then HHA for 250' and CGA)  Ambulation - Level of Assistance: CGA;SBA      Therapeutic Exercise: (  30 minutes):  Exercises per grid below to improve mobility, strength and balance.  Required minimal verbal cues to promote proper body posture, promote proper body mechanics and promote proper body breathing techniques.  Progressed range and repetitions as indicated.     Date:  09/04/13 Date:   Date:     ACTIVITY/EXERCISE AM PM AM PM AM PM   Ambulation 1000' with RW, 250' with HHA        Standing Heel Raises X 15 B        Standing Toe Raises X 15 B        Standing Mini Squats         Standing Hip Flexion X 15 B        Standing Hip Abduction X 15 B        Standing Hamstring Curls         Standing hip extension X 15 B                 B = bilateral; AA =  active assistive; A = active; P = passive        Braces/Orthotics/Lines/Etc:   ?? O2 Device: Nasal cannula  Safety:   After treatment position/precautions:  ?? Up in chair, Bed alarm/tab alert on, Bed/Chair-wheels locked, Call light within reach and RN notified  Progression/Medical Necessity:   ?? Patient demonstrates good rehab potential due to higher previous functional level.  Compliance with Program/Exercises: compliant all of the time.   Reason for Continuation of Services/Other Comments:  ?? Patient continues to require present interventions due to patient's inability to transfer, ambulate, and complete other functional mobility activities which are appropriate for homegoing at an acceptable level of independence. .  Recommendations/Intent for next treatment session: Treatment next visit will focus on advancements to more challenging activities and reduction in assistance provided.  Total Treatment Duration:  Time In: 1010  Time Out: 1040  DAWN H BRACKEN, PTA

## 2013-09-05 LAB — CULTURE, URINE: Culture result:: NO GROWTH

## 2013-09-05 MED ADMIN — sodium chloride (NS) flush 5-10 mL: INTRAVENOUS | @ 10:00:00 | NDC 87701099893

## 2013-09-05 MED ADMIN — albuterol (PROVENTIL VENTOLIN) nebulizer solution 2.5 mg: RESPIRATORY_TRACT | @ 13:00:00 | NDC 00487950101

## 2013-09-05 MED ADMIN — albuterol (PROVENTIL VENTOLIN) nebulizer solution 2.5 mg: RESPIRATORY_TRACT | @ 02:00:00 | NDC 00487950101

## 2013-09-05 MED ADMIN — thiamine (B-1) tablet 100 mg: ORAL | @ 15:00:00 | NDC 90011015050

## 2013-09-05 MED ADMIN — 0.9% sodium chloride with KCl 20 mEq/L infusion: INTRAVENOUS | @ 15:00:00 | NDC 00409711509

## 2013-09-05 MED ADMIN — amlodipine (NORVASC) tablet 5 mg: ORAL | @ 15:00:00 | NDC 68084025911

## 2013-09-05 MED ADMIN — levofloxacin (LEVAQUIN) tablet 750 mg: ORAL | @ 18:00:00 | NDC 68084048211

## 2013-09-05 MED ADMIN — magnesium oxide (MAG-OX) tablet 800 mg: ORAL | @ 22:00:00 | NDC 96295013573

## 2013-09-05 MED ADMIN — chlordiazePOXIDE (LIBRIUM) capsule 5 mg: ORAL | @ 22:00:00 | NDC 51079037401

## 2013-09-05 MED ADMIN — 0.9% sodium chloride with KCl 20 mEq/L infusion: INTRAVENOUS | @ 22:00:00 | NDC 00409711509

## 2013-09-05 MED ADMIN — chlordiazePOXIDE (LIBRIUM) capsule 5 mg: ORAL | @ 15:00:00 | NDC 51079037401

## 2013-09-05 MED ADMIN — pantoprazole (PROTONIX) injection 40 mg: INTRAVENOUS | NDC 00008400101

## 2013-09-05 MED ADMIN — docusate sodium (COLACE) capsule 100 mg: ORAL | @ 22:00:00 | NDC 62584068311

## 2013-09-05 MED ADMIN — piperacillin-tazobactam (ZOSYN) 4.5 g in 0.9% sodium chloride (MBP/ADV) 100 mL MBP: INTRAVENOUS | @ 02:00:00 | NDC 00338055318

## 2013-09-05 MED ADMIN — sodium chloride (NS) flush 5-10 mL: INTRAVENOUS | @ 02:00:00 | NDC 87701099893

## 2013-09-05 MED ADMIN — sodium chloride (NS) flush 5-10 mL: INTRAVENOUS | NDC 87701099893

## 2013-09-05 MED ADMIN — tramadol (ULTRAM) tablet 50 mg: ORAL | @ 15:00:00 | NDC 51079099101

## 2013-09-05 MED ADMIN — docusate sodium (COLACE) capsule 100 mg: ORAL | @ 15:00:00 | NDC 62584068311

## 2013-09-05 MED ADMIN — sodium chloride (NS) flush 5-10 mL: INTRAVENOUS | @ 18:00:00 | NDC 87701099893

## 2013-09-05 MED ADMIN — pantoprazole (PROTONIX) injection 40 mg: INTRAVENOUS | @ 15:00:00 | NDC 63323018610

## 2013-09-05 MED ADMIN — multivitamin, stress formula (STRESS TAB) tablet 1 tablet: ORAL | @ 15:00:00

## 2013-09-05 MED ADMIN — magnesium oxide (MAG-OX) tablet 800 mg: ORAL | @ 15:00:00 | NDC 96295013573

## 2013-09-05 MED ADMIN — lisinopril (PRINIVIL, ZESTRIL) tablet 40 mg: ORAL | @ 15:00:00 | NDC 68084019811

## 2013-09-05 MED ADMIN — 0.9% sodium chloride with KCl 20 mEq/L infusion: INTRAVENOUS | @ 02:00:00 | NDC 00409711509

## 2013-09-05 MED ADMIN — piperacillin-tazobactam (ZOSYN) 4.5 g in 0.9% sodium chloride (MBP/ADV) 100 mL MBP: INTRAVENOUS | @ 10:00:00 | NDC 00338055318

## 2013-09-05 NOTE — Telephone Encounter (Signed)
I received a message from Dr. Clearance Coots: "PSA is undetectable which is perfect." I called pt and informed him.    Closing telephone encounter.

## 2013-09-05 NOTE — Progress Notes (Addendum)
Brett Becker  Admission Date: 08/28/2013             Daily Progress Note: 09/05/2013    The patient's chart is reviewed and the patient is discussed with the staff.    Subjective:   Had to go back on oxygen. 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 Plans to go home with friend.     Current Facility-Administered Medications   Medication Dose Route Frequency   ??? magnesium oxide (MAG-OX) tablet 800 mg  800 mg Oral BID   ??? docusate sodium (COLACE) capsule 100 mg  100 mg Oral BID   ??? piperacillin-tazobactam (ZOSYN) 4.5 g in 0.9% sodium chloride (MBP/ADV) 100 mL MBP  4.5 g IntraVENous Q8H   ??? chlordiazePOXIDE (LIBRIUM) capsule 5 mg  5 mg Oral BID   ??? albuterol (PROVENTIL VENTOLIN) nebulizer solution 2.5 mg  2.5 mg Nebulization BID RT   ??? thiamine (B-1) tablet 100 mg  100 mg Oral DAILY   ??? multivitamin, stress formula (STRESS TAB) tablet 1 tablet  1 tablet Oral DAILY   ??? albuterol (PROVENTIL VENTOLIN) nebulizer solution 2.5 mg  2.5 mg Nebulization Q6H PRN   ??? acetaminophen (TYLENOL) tablet 500 mg  500 mg Oral Q4H PRN   ??? tramadol (ULTRAM) tablet 50 mg  50 mg Oral Q6H PRN   ??? amlodipine (NORVASC) tablet 5 mg  5 mg Oral DAILY   ??? hydralazine (APRESOLINE) 20 mg/mL injection 20 mg  20 mg IntraVENous Q6H PRN   ??? pantoprazole (PROTONIX) injection 40 mg  40 mg IntraVENous Q12H   ??? lisinopril (PRINIVIL, ZESTRIL) tablet 40 mg  40 mg Oral DAILY   ??? NUTRITIONAL SUPPORT ELECTROLYTE PRN ORDERS   Does Not Apply PRN   ??? sodium chloride (NS) flush 5-10 mL  5-10 mL IntraVENous Q8H   ??? sodium chloride (NS) flush 5-10 mL  5-10 mL IntraVENous PRN   ??? lorazepam (ATIVAN) injection 1 mg  1 mg IntraVENous Q1H PRN   ??? haloperidol lactate (HALDOL) injection 1 mg  1 mg IntraVENous Q2H PRN   ??? ondansetron (ZOFRAN) injection 4 mg  4 mg IntraVENous Q6H PRN   ??? pneumococcal 23-valent (PNEUMOVAX 23) injection 0.5 mL  0.5 mL IntraMUSCular PRIOR TO DISCHARGE   ??? 0.9% sodium chloride with KCl 20 mEq/L infusion   IntraVENous CONTINUOUS   ???  nicotine (NICODERM CQ) 21 mg/24 hr patch 1 patch  1 patch TransDERmal DAILY         Objective:     Filed Vitals:    09/05/13 0359 09/05/13 0713 09/05/13 0831 09/05/13 1040   BP: 117/75 104/75  143/87   Pulse: 72 69  76   Temp: 98.4 ??F (36.9 ??C) 98 ??F (36.7 ??C)     Resp: 18 18     Height:       Weight:       SpO2: 96% 99% 95%      Intake and Output:   09/22 1900 - 09/24 0659  In: 3662 [P.O.:240; I.V.:3422]  Out: 3975 [Urine:3975]  09/24 0700 - 09/24 1859  In: 240 [P.O.:240]  Out: 600 [Urine:600]    Physical Exam:   Constitutional:  the patient is thin, well developed and in no acute distress  HEENT:  Sclera clear, pupils equal, oral mucosa moist  Lungs: clear bilaterally. Oxygen laying in bed bedside him at present  Cardiovascular:  RRR without M,G,R  Abd/GI: soft and non-tender; with positive bowel sounds.  Ext: warm without cyanosis. There is no  lower leg edema.  Musculoskeletal: moves all four extremities with equal strength  Skin:  no jaundice or rashes, no wounds   Neuro: no gross neuro deficits   Musculoskeletal: can ambulate. No deformity  Psychiatric:  alert and oriented. Calm.     ROS: reports good appetite. Some weakness. No dyspnea.   Lines: peripheral IV    CHEST XRAY: none today    LAB  Recent Labs      09/04/13   0442   WBC  5.9   HGB  10.6*   HCT  30.5*   PLT  230     Recent Labs      09/04/13   0442  09/03/13   1625  09/03/13   0510   NA  134*   --   134*   K  3.6   --   4.9   CL  101   --   104   CO2  24   --   22   GLU  95   --   104*   BUN  5*   --   5*   CREA  0.75*   --   0.73*   MG  1.3*  1.6*  1.2*   PHOS  4.1   --   3.3       Assessment:  (Medical Decision Making)     Hospital Problems Date Reviewed: 09/04/2013        ICD-9-CM Class Noted POA    Aspiration pneumonia 507.0  09/03/2013 No    Blood cultures neg. No sputum for culture. Temperature down. Nl WBC    Marijuana abuse (Chronic) 305.20  09/02/2013 Yes    noted    Hypoxia 799.02  09/02/2013 No    May be chronic    Abnormal CXR 793.19   09/02/2013 No    Suspected aspiration event    Gastric ulcer 531.90  08/31/2013 Yes    Per recent EGD    Hepatitis C (Chronic) 070.70  08/31/2013 Yes        Occult blood positive stool 792.1  08/30/2013 Yes    Associated with above    HTN (hypertension) (Chronic) 401.9  08/30/2013 Yes    controlled    *Delirium tremens 291.0  08/28/2013 Yes    resolved    Macrocytosis 289.89  08/28/2013 Yes    resolved    Hyponatremia 276.1  08/28/2013 Yes    better    Hypomagnesemia 275.2  08/28/2013 Yes    No labs today    Transaminitis 790.4  08/28/2013 Yes    better    Alcohol abuse (Chronic) 305.00  08/28/2013 Yes              Plan:  (Medical Decision Making)   1. Day 3 zoysn. Temperature down. Treatment for suspected aspiration pneumonia - ? Change to oral abx soon.   2. Still oxygen dependent and may be at discharge as well. 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   3. IV fluids per hospitalist.     Terisa Starr, NP    Additional Comments:  Will change to Po levaquin     I have spoken with and examined the patient. I agree with the above assessment and plan as documented.    Anitra Lauth, MD

## 2013-09-05 NOTE — Progress Notes (Signed)
Problem: Mobility Impaired (Adult and Pediatric)  Goal: *Acute Goals and Plan of Care (Insert Text)  LTG:  (1.)Brett Becker will move from supine to sit and sit to supine , scoot up and down and roll side to side with INDEPENDENCE within 7 day(s).   (2.)Brett Becker will transfer from bed to chair and chair to bed with INDEPENDENT using the least restrictive device within 7 day(s).   (3.)Brett Becker will ambulate with MODIFIED INDEPENDENCE for 450 feet with the least restrictive device within 7 day(s).   (4.)Brett Becker will navigate 4 steps with or without handrail use with SUPERVISION with the least restrictive device within 7 days.   ________________________________________________________________________________________________  PHYSICAL THERAPY: Daily Note, Treatment Day: 2nd and AM  INPATIENT: Medicaid : Hospital Day: 9    NAME/AGE/GENDER: Brett Becker is a 52 y.o. male  DATE: 09/05/2013  PRIMARY DIAGNOSIS: Delirium tremens  Delirium tremens  heme positive stool; anemia  Delirium tremens  Delirium tremens  Procedure(s) (LRB):  ESOPHAGOGASTRODUODENOSCOPY (EGD) (N/A)  6 Days Post-Op  Treatment Diagnosis: 787.23  INTERDISCIPLINARY COLLABORATION: Physical Therapy Assistant and Registered Nurse  ASSESSMENT:   [name] presents sitting up on side of bed and is very agreeable to therapy.  Pt stood with vc for correct hand placement and then ambulated about 250' pushing the IV pole and CGA.  Pt required verbal cues to maintain proper body posture and breathing mechanics.  Pt participated in group exercises as below and then ambulated back to his room.  Pt left in bathroom, nursing aware.  Pt still ambulating well with CGA and pushing IV pole, however is still unsteady.  Pt demonstrates continued need for skilled PT due to deficits including but not limited to decreased strength, balance, transfer ability, and activity tolerance.       ????????This section established at most recent assessment??????????  PROBLEM LIST (Impairments  causing functional limitations):  1. Decreased Strength affecting function  2. Decreased ADL/Functional Activities  3. Decreased Transfer Abilities  4. Decreased Ambulation Ability/Technique  5. Decreased Balance  6. Decreased Activity Tolerance  REHABILITATION POTENTIAL FOR STATED GOALS: GOOD      PLAN OF CARE:   INTERVENTIONS PLANNED: (Benefits and precautions of physical therapy have been discussed with the patient.)  1. balance exercise  2. bed mobility  3. family education  4. gait training  5. home exercise program (HEP)  6. therapeutic activities  7. therapeutic exercise/strengthening  8. Stair climbing activities  FREQUENCY/DURATION: Follow patient 1-2 times per day/4-7 days per week until goals are met in order to address above goals.    RECOMMENDED REHABILITATION/EQUIPMENT: (at time of discharge pending progress):   Home Health: Physical Therapy.  SUBJECTIVE:   Pt agreeable to therapy.    Present Symptoms: No new complaints    Pain Intensity 1: 0  Pain Location 1: Shoulder  Pain Intervention(s) 1: Medication (see MAR)  History of Present Injury/Illness: Per H&P: "History of Present Illness: Patient is a 52 y.o. male without significant past medical history who is seen in consultation at the request of Dr. Arlester Marker for heme positive stool and hematemesis. The patient was admitted to ICU on 08/28/13 after feeling poorly over the past few days with decreased oral intake (including alcohol) and having a witnessed seizure in the ER. While in ICU, the patient had multiple Yoshimura-black stools that came to be hemoccult positive and the patient reported having an episode of hematemesis in the past, not recently. Hgb has dropped 2 grams since admission. LFTs elevated  on admission. The patient denies any current nausea, vomiting, or abdominal pain. He admits to occasional loose, black stools at home. He admits to a 10-15 lb weight loss. He has some reflux, but isn't able to describe to what frequency/ severity. Takes  NSAIDs occasionally for headaches. Denies family history of GI malignancy. No prior colonoscopy or EGD.   He states he drinks a 6 pack per day, but does admit that these are the "big beers". He also drinks liquor sometimes and smokes marijuana. Denies other drug use. He denies any prior diagnosis of hepatitis C. He has one tattoo, that he gave himself "just playing" with the equipment many years ago. He denies any prior liver disease."    Prior Level of Function/Home Situation: Pt states that he got around "fairly good" prior to admission. Pt states that he would use a walking stick when going on longer walks or in unfamiliar areas. Pt also states that he would use a bicycle to get around. Pt lives in an apartment with a roommate. Pt states that his roommate has visited him while he has been in the hospital and will ask him to bring his walking stick next time he speaks with him.   Home Environment: Apartment  # Steps to Enter: 5  Rails to Enter: Yes  Hand Rails : Bilateral  Wheelchair Ramp: No  One/Two Story Residence: One story  Living Alone: No (Has roommate that has visited this patient in the hospital)  Support Systems: Child(ren);Friends \\ neighbors  Patient Expects to be Discharged to:: Apartment  Current DME Used/Available at Home: Other (comment) (Walking stick)  OBJECTIVE/TREATMENT:   (In addition to Assessment/Re-Assessment sessions the following treatments were rendered)                                      [facility]??? ???6 Clicks???                                          Basic Mobility Inpatient Short Form  How much difficulty does the patient currently have... Unable A Lot A Little None   1.  Turning over in bed (including adjusting bedclothes, sheets and blankets)?   [ ]  1   [ ]  2   [ ]  3   [X]  4   2.  Sitting down on and standing up from a chair with arms ( e.g., wheelchair, bedside commode, etc.)   [ ]  1   [ ]  2   [X]  3   [ ]  4   3.  Moving from lying on back to sitting on the side of  the bed?   [ ]  1   [ ]  2   [X]  3   [ ]  4               How much help from another person does the patient currently need... Total A Lot A Little None   4.  Moving to and from a bed to a chair (including a wheelchair)?   [ ]  1   [ ]  2   [X]  3   [ ]  4   5.  Need to walk in hospital room?   [ ]  1   [ ]  2   [X]  3   [ ]  4  6.  Climbing 3-5 steps with a railing?   [ ]  1   [X]  2   [ ]  3   [ ]  4   ?? 2007, Trustees of [address], under license to [employer]. All rights reserved       Score:  Initial: 18 Most Recent: 18 (Date: 09/03/13 )   Interpretation of Tool:  Represents activities that are increasingly more difficult (i.e. Bed mobility, Transfers, Gait).  Score 24 23 22-20 19-15 14-10 9-7 6   Modifier CH CI CJ CK CL CM CN       ?? Mobility - Walking and Moving Around:              [phone] - CURRENT STATUS:        CK - 40%-59% impaired, limited or restricted              G8979 - GOAL STATUS:                CJ - 20%-39% impaired, limited or restricted              U0454 - D/C STATUS:                    ---------------To be determined---------------  [place] [employer] [facility] [facility] [place]  [place] [facility] [place]  Product Type: Managed Care Medicaid     Most Recent Physical Functioning:   Gross Assessment:  AROM: Within functional limits  Strength: Generally decreased, functional (LE grossly 4-/5)  Coordination: Generally decreased, functional (finger to nose slightly delayed speed bilaterally.)  Sensation: Intact  Posture:  Posture (WDL): Exceptions to WDL  Posture Assessment: Forward head;Rounded shoulders;Trunk flexion  Balance:  Sitting - Static: Good (unsupported)  Sitting - Dynamic: Good (unsupported)  Standing - Static: Fair  Standing - Dynamic : Fair (to fair + pushing IV pole)  Bed Mobility:     Wheelchair Mobility:     Transfers:  Sit to Stand: Supervision  Stand to Sit: Supervision  Gait:     Base of Support: Narrowed  Speed/Cadence: Pace decreased (<100 feet/min)  Step Length: Left shortened;Right shortened   Distance (ft): 250 Feet (ft) (x 2)  Assistive Device:  (pushing IV pole)  Ambulation - Level of Assistance: CGA (close)      Therapeutic Activity: (    10 minutes):  Therapeutic activities including Chair transfers, Toilet transfers and Ambulation on level ground to improve mobility, strength and balance.  Required minimal   to promote dynamic balance in standing.     Group Therapeutic Exercise: (  20 minutes):  Exercises per grid below to improve mobility, strength and balance.  Required minimal verbal cues to promote proper body posture, promote proper body mechanics and promote proper body breathing techniques.  Progressed range and repetitions as indicated.     Date:  09/04/13 Date:  09/05/13 Date:     ACTIVITY/EXERCISE AM PM AM PM AM PM   Ambulation 1000' with RW, 250' with HHA        Standing Heel Raises X 15 B        Standing Toe Raises X 15 B        Standing Mini Squats         Standing Hip Flexion X 15 B        Standing Hip Abduction X 15 B        Standing Hamstring Curls         Standing hip extension  X 15 B        Seated ankle pumps   X 15 B      Seated LAQ   X 15 B      Seated hip flexion   X 15 B      Seated hip abd   X 15 B      UE exs with red t-band   X 15 B ea               B = bilateral; AA = active assistive; A = active; P = passive        Braces/Orthotics/Lines/Etc:   ?? O2 Device: Nasal cannula  Safety:   After treatment position/precautions:  ?? Bed alarm/tab alert on, Bed/Chair-wheels locked, Call light within reach, RN notified and up in bathroom  Progression/Medical Necessity:   ?? Patient demonstrates good rehab potential due to higher previous functional level.  Compliance with Program/Exercises: compliant all of the time.   Reason for Continuation of Services/Other Comments:  ?? Patient continues to require present interventions due to patient's inability to transfer, ambulate, and complete other functional mobility activities which are appropriate for homegoing at an acceptable level of  independence. .  Recommendations/Intent for next treatment session: Treatment next visit will focus on advancements to more challenging activities and reduction in assistance provided.  Total Treatment Duration:  Time In: 0950  Time Out: 1025  DAWN H BRACKEN, PTA

## 2013-09-05 NOTE — Progress Notes (Signed)
Hospitalist Progress Note    Subjective:   Daily Progress Note: 09/05/2013 10:31 AM    Brett Becker is a 52 yo WM who has a history of alcohol abuse admitted 9/16 after having a seizure outside of the hospital that occurred again in the ED. No pmh of seizures. He had been having nausea and vomiting for several days prior to admission and had decreased oral intake, including alcohol. He was admitted to the ED for treatment of DTs. Over the past 24 hours he has required prn haldol x 0 and prn ativan x 0 while on scheduled librium that has been weaned to 5 mg TID.  Patient was requiring soft restraints and a sitter at bedside for hallucinations and delirium but his mentation is slowly improving. He is currently arousable and oriented for me to person but not place with no signs of tremors nor agitation. Course complicated by occult positive stool and patient endorsing hematemesis. EGD performed by GI showing numerous gastric ulcers and is H pylori positive.  Yesterday he became sob and desatted into the 80s.  CXR obtained showing possible enlargement of a previously seen PTX.  He was placed on 5-6 liters NC and CT Chest obtained showing bulla.  Pulmonology consulted.  Will need outpatient COPD follow up, PFTs, etc.  Spiked temp of 100.6 overnight, night MD pan-cultured patient and started zosyn as aspiration is likely etiology.    No other acute events overnight.  Continues to work with speech therapy. On 2L O2. Respiratory spx improved significantly      Current Facility-Administered Medications   Medication Dose Route Frequency   ??? levofloxacin (LEVAQUIN) tablet 750 mg  750 mg Oral Q24H   ??? magnesium oxide (MAG-OX) tablet 800 mg  800 mg Oral BID   ??? docusate sodium (COLACE) capsule 100 mg  100 mg Oral BID   ??? chlordiazePOXIDE (LIBRIUM) capsule 5 mg  5 mg Oral BID   ??? albuterol (PROVENTIL VENTOLIN) nebulizer solution 2.5 mg  2.5 mg Nebulization BID RT   ??? thiamine (B-1) tablet 100 mg  100 mg Oral DAILY   ??? multivitamin,  stress formula (STRESS TAB) tablet 1 tablet  1 tablet Oral DAILY   ??? albuterol (PROVENTIL VENTOLIN) nebulizer solution 2.5 mg  2.5 mg Nebulization Q6H PRN   ??? acetaminophen (TYLENOL) tablet 500 mg  500 mg Oral Q4H PRN   ??? tramadol (ULTRAM) tablet 50 mg  50 mg Oral Q6H PRN   ??? amlodipine (NORVASC) tablet 5 mg  5 mg Oral DAILY   ??? hydralazine (APRESOLINE) 20 mg/mL injection 20 mg  20 mg IntraVENous Q6H PRN   ??? pantoprazole (PROTONIX) injection 40 mg  40 mg IntraVENous Q12H   ??? lisinopril (PRINIVIL, ZESTRIL) tablet 40 mg  40 mg Oral DAILY   ??? NUTRITIONAL SUPPORT ELECTROLYTE PRN ORDERS   Does Not Apply PRN   ??? sodium chloride (NS) flush 5-10 mL  5-10 mL IntraVENous Q8H   ??? sodium chloride (NS) flush 5-10 mL  5-10 mL IntraVENous PRN   ??? lorazepam (ATIVAN) injection 1 mg  1 mg IntraVENous Q1H PRN   ??? haloperidol lactate (HALDOL) injection 1 mg  1 mg IntraVENous Q2H PRN   ??? ondansetron (ZOFRAN) injection 4 mg  4 mg IntraVENous Q6H PRN   ??? pneumococcal 23-valent (PNEUMOVAX 23) injection 0.5 mL  0.5 mL IntraMUSCular PRIOR TO DISCHARGE   ??? 0.9% sodium chloride with KCl 20 mEq/L infusion   IntraVENous CONTINUOUS   ??? nicotine (NICODERM CQ) 21  mg/24 hr patch 1 patch  1 patch TransDERmal DAILY        Review of Systems  Patient endorses wheezing, diarrhea and a cough.  He denies tremor, weakness, lethargy, dysphagia, chest pain, sob, palpitations, abdominal pain, constipation, melena, dysuria, hematuria.    Objective:     BP 123/68   Pulse 62   Temp(Src) 97.6 ??F (36.4 ??C)   Resp 18   Ht 5\' 8"  (1.727 m)   Wt 55.2 kg (121 lb 11.1 oz)   BMI 18.51 kg/m2   SpO2 94% O2 Flow Rate (L/min): 2 l/min O2 Device: Room air    Temp (24hrs), Avg:98.2 ??F (36.8 ??C), Min:97.6 ??F (36.4 ??C), Max:98.7 ??F (37.1 ??C)      09/24 0700 - 09/24 1859  In: 1537 [P.O.:480; I.V.:1057]  Out: 1450 [Urine:1450]  09/22 1900 - 09/24 0659  In: 3662 [P.O.:240; I.V.:3422]  Out: 3975 [Urine:3975]    PHYSICAL EXAM:    General:  Awake, alert, oriented, mild distress, pale,  cachectic  Eyes:  Non icteric, EOMI  Neck:  Supple  CV:  RRR  PULM: diminished bibasilar  Abd:  Soft, nontender, non distended, active bowel sounds  Skin/ext:  Warm, dry, no lower ext edema, no hand tremor    Additional comments:  All labs, notes and studies from the past 24 hours have been personally reviewed today by me.    Data Review    No results found for this or any previous visit (from the past 24 hour(s)).      Assessment/Plan:     Principal Problem:    Delirium tremens (08/28/2013)    Active Problems:    Macrocytosis (08/28/2013)      Hyponatremia (08/28/2013)      Hypomagnesemia (08/28/2013)      Transaminitis (08/28/2013)      Alcohol abuse (08/28/2013)      Occult blood positive stool (08/30/2013)      HTN (hypertension) (08/30/2013)      Gastric ulcer (08/31/2013)      Hepatitis C (08/31/2013)      Marijuana abuse (09/02/2013)      Hypoxia (09/02/2013)      Abnormal CXR (09/02/2013)      Aspiration pneumonia (09/03/2013)    Plan of Care    DTs due to alcohol withdrawal: No further seizure activity. Status post banana bags, on oral MVI and thiamine. Required no prn ativan nor haldol, was therefore decreased scheduled librium- RESOLVED      Electrolyte abnormalities: Aggressively replacing K and Mg. To follow- AM LAB     Gastric Ulcerations: H pylori positive. Needs O-CLAM therapy after treatment for aspiration pna. GI following. Will need repeat EGD as an outpatient.     Hyponatremia: Improved from 131 to 134. IMPROVED     Hypoxia:  On 2L oxygen to keep o2 sat 88-92%.  Will need COPD workup, PFTs, etc.    Fever:  Pan-cxed, urinalysis shows no infection, on zosyn for aspiration pneumonia.  Endorsing diarrhea, will check c diff. Has hx of COPD and off O2 now. Hid blood Cs w/ GPC is 1/4 bottles and pt is afebrile and doing better, more likely contaminent      Care Plan discussed with:   Patient  Dispo: home in 1-2 days preferably when off O2  Total time spent with patient: 20 minutes.

## 2013-09-05 NOTE — Progress Notes (Addendum)
Problem: Nutrition Deficit  Goal: *Optimize nutritional status  Nutrition F/U  Assessment:   Food/Nutrition Patient History:  Pt says his appetite is "OK" and that he is eating about 50% of his meals. He is eating better here than he would at home because all his does at home is drink alcohol and then he does not eat.  Diet orders: 9-18:Clear liquid, 9-20 Mechanical soft, GI soft with nectar thick liquids and no mixed consistencies, 9-22: GI soft, no mixed consistencies 9-23: mechanical soft, GI soft, no mixed consistencies, no bread/crackers, nectar thick liquids  Intake/Comparative Standards: Average meal intake for past 7 day(s)/7 recorded meal(s): 70%. This potentially meets ~ 90% of kcal and ~90% of protein needs    Nutrition Diagnosis: Inadequate oral intake r/t decreased ability to consume sufficient oral intake as evidenced by intake as above, pt replacing food with alcohol PTA, % IBW 78% c/w mild somatic depletion    Intervention:  1. Meals and snacks: Continue current diet.  2. Supplement(s): Mighty shake (300 calories and 9 grams of protein per 6 oz) tid.  Harlow Mares, RD, LD, CNSC [phone]

## 2013-09-05 NOTE — Progress Notes (Signed)
Problem: Dysphagia (Adult)  Goal: *Acute Goals and Plan of Care (Insert Text)  STG: Patient will safely swallow regular textures and nectar liquids by cup without overt signs or symptoms of aspiration 90% of the time. GOAL MODIFIED 09/04/13  STG: Patient will participate in Modified Barium Swallow study x1. GOAL MET 09/03/13  STG: Patient will perform laryngeal strengthening exercises x10 each with 80% accuracy.  LTG: Patient will consume least restrictive diet without respiratory compromise 100% of the time by discharge.     Speech language pathology: BEDSIDE swallow study: Daily Note 2    NAME/AGE/GENDER: Brett Becker is a 52 y.o. male  DATE: 09/05/2013  PRIMARY DIAGNOSIS: Delirium tremens  Delirium tremens  heme positive stool; anemia  Procedure(s) (LRB):  ESOPHAGOGASTRODUODENOSCOPY (EGD) (N/A) 6 Days Post-Op  Treatment Diagnosis: 787.23  INTERDISCIPLINARY COLLABORATION: Radiologist  PRECAUTIONS/ALLERGIES: Review of patient's allergies indicates no known allergies. ASSESSMENT/PLAN OF CARE:Patient participated with laryngeal exercises 2x5 with 80% accuracy.  Patient not interested in trying po stating that he just had lunch and is not hungry.  After completing tongue base exercises patient experienced significant coughing episode with production of clear sputum.  Discussed recommendations for continuing nectar liquids with written information provided.  Re-educated on need to be fully upright with all intake and be mindful for rate/quantity with meals.  Patient expressed understanding.  Recommend continue with current diet.  Will continue to follow.  ????????This section established at most recent assessment??????????  RECOMMENDATIONS AND PLANNED INTERVENTIONS (Benefits and precautions of therapy have been discussed with the patient.):  ?? PO:  Mechanical soft with chopped meat and vegetables  ?? Liquids:  nectar  MEDICATIONS:  ?? With liquid  COMPENSATORY STRATEGIES/MODIFICATIONS INCLUDING:  ?? Alternate liquids/solids   ?? Double swallow  ?? Upright for all PO  OTHER RECOMMENDATIONS (including follow up treatment recommendations):   ?? Tongue based expressions  ?? Family training/education  ?? Laryngeal exercises  ?? Patient education  FREQUENCY/DURATION: Continue to follow patient 3-5 times a week for duration of hospital stay to address above goals.SUBJECTIVE:   "That just went down the wrong way." following coughing episode.  History of Present Injury/Illness: Brett Becker  has no past medical history on file.  He also  has past surgical history that includes neurological procedure unlisted and back surgery.   Present Symptoms: intermittent coughing during bedside swallow eval   Pain Intensity 1: 0  Pain Location 1: Back  Pain Intervention(s) 1: Medication (see MAR)    Current Dietary Status:  Mechanical soft, nectar-thick liquids     History of reflux:  YES        Social History/Home Situation:    Home Environment: Apartment  # Steps to Enter: 5  Rails to Enter: Yes  Hand Rails : Bilateral  Wheelchair Ramp: No  One/Two Story Residence: One story  Living Alone: No (Has roommate that has visited this patient in the hospital)  Support Systems: Child(ren);Friends \\ neighbors  Patient Expects to be Discharged to:: Apartment  Current DME Used/Available at Home: Other (comment) (Walking stick)  OBJECTIVE:       Cognitive/Communication Status:  Mental Status  Neurologic State: Alert  Orientation Level: Appropriate for age;Oriented X4  Cognition: Follows commands  Perception: Appears intact  Perseveration: No perseveration noted  Safety/Judgement: Awareness of environment    Oral Assessment:  Oral Assessment  Labial: No impairment  Dentition: Limited;Natural  Oral Hygiene: adequate  Lingual: No impairment    Vocal Quality: low volume, hypernasal  LARYNGEAL / PHARYNGEAL EXERCISES:           Effortful Swallow: Yes  Reps : 5  Sets : 2  Hard Glottal Attack: Yes  Reps : 5  Sets : 2              Look Up at Ceiling/Gargle: Yes  Reps : 5  Sets : 2   Masako: Yes  Reps : 5  Sets : 2  Mendelsohn Maneuver: Yes  Reps : 5  Sets : 2                   Sing "EEE": Yes  Reps : 5  Sets : 2                                           Dysphagia Activities: Activities/Procedures listed utilized to improve progress in swallow timeliness, swallow function, diet tolerance and swallow safety. Required moderate cueing to improve swallow safety, work toward diet advancement and decrease aspiration risk.    Tool Used: Dysphagia Outcome and Severity Scale (DOSS)    Score Comments   Normal Diet  []  7 With no strategies or extra time needed       Functional Swallow  []  6 May have mild oral or pharyngeal delay       Mild Dysphagia    []  5 Which may require one diet consistency restricted (those who demonstrate penetration which is entirely cleared on MBS would be included)   Mild-Moderate Dysphagia  [x]  4 With 1-2 diet consistencies restricted       Moderate Dysphagia  []  3 With 2 or more diet consistencies restricted       Moderately Severe Dysphagia  []  2 With partial PO strategies (trials with ST only)       Severe Dysphagia  []  1 With inability to tolerate any PO safely          Score:  Initial: 4 Most Recent: 4 (Date: 09/04/13 )   Interpretation of Tool: The Dysphagia Outcome and Severity Scale (DOSS) is a simple, easy-to-use, 7-point scale developed to systematically rate the functional severity of dysphagia based on objective assessment and make recommendations for diet level, independence level, and type of nutrition.     Score 7 6 5 4 3 2 1    Modifier CH CI CJ CK CL CM CN     Swallowing:    A5409 - CURRENT STATUS: CK - 40%-59% impaired, limited or restricted   W1191 - GOAL STATUS:  CI - 1%-19% impaired, limited or restricted   Y7829 - D/C STATUS:  ---------------To be determined---------------  [place] [employer] [facility] [facility] [place]  [place] [employer] [facility] [place]  Product Type: Managed Care Medicaid     __________________________________________________________________________________________________  Safety:   After treatment position/precautions:  ?? Patient upright in chair eating his lunch  ?? Results and recommendations discussed with RN Tammy  Recommendations for treatment: laryngeal exercises, training in safe swallowing strategies  Total Treatment Duration:  Time In: 1358   Time Out: 1425    Reginia Naas MS, CCC-SLP

## 2013-09-05 NOTE — Progress Notes (Signed)
Bedside report complete with Shanda Bumps, RN

## 2013-09-06 LAB — METABOLIC PANEL, BASIC
Anion gap: 8 mmol/L (ref 7–16)
BUN: 4 MG/DL — ABNORMAL LOW (ref 6–23)
CO2: 26 mmol/L (ref 21–32)
Calcium: 9.1 MG/DL (ref 8.3–10.4)
Chloride: 100 mmol/L (ref 98–107)
Creatinine: 0.84 MG/DL (ref 0.8–1.5)
GFR est AA: >60 mL/min/{1.73_m2} (ref >60)
GFR est non-AA: >60 mL/min/{1.73_m2} (ref >60)
Glucose: 105 mg/dL — ABNORMAL HIGH (ref 65–100)
Potassium: 4.1 mmol/L (ref 3.5–5.1)
Sodium: 134 mmol/L — ABNORMAL LOW (ref 136–145)

## 2013-09-06 LAB — HEMOGLOBIN: HGB: 12.5 g/dL — ABNORMAL LOW (ref 13.6–17.2)

## 2013-09-06 LAB — PHOSPHORUS: Phosphorus: 3.9 MG/DL (ref 2.5–4.5)

## 2013-09-06 LAB — MAGNESIUM: Magnesium: 1.2 mg/dL — CL (ref 1.8–2.4)

## 2013-09-06 MED ADMIN — magnesium oxide (MAG-OX) tablet 800 mg: ORAL | @ 22:00:00 | NDC 96295013573

## 2013-09-06 MED ADMIN — chlordiazePOXIDE (LIBRIUM) capsule 5 mg: ORAL | @ 22:00:00 | NDC 51079037401

## 2013-09-06 MED ADMIN — multivitamin, stress formula (STRESS TAB) tablet 1 tablet: ORAL | @ 13:00:00

## 2013-09-06 MED ADMIN — tramadol (ULTRAM) tablet 50 mg: ORAL | @ 13:00:00 | NDC 51079099101

## 2013-09-06 MED ADMIN — amlodipine (NORVASC) tablet 5 mg: ORAL | @ 13:00:00 | NDC 68084025911

## 2013-09-06 MED ADMIN — chlordiazePOXIDE (LIBRIUM) capsule 5 mg: ORAL | @ 13:00:00 | NDC 51079037401

## 2013-09-06 MED ADMIN — sodium chloride (NS) flush 5-10 mL: INTRAVENOUS | @ 01:00:00 | NDC 87701099893

## 2013-09-06 MED ADMIN — sodium chloride (NS) flush 5-10 mL: INTRAVENOUS | @ 10:00:00 | NDC 87701099893

## 2013-09-06 MED ADMIN — magnesium sulfate 4 g/100 ml IVPB: INTRAVENOUS | @ 14:00:00 | NDC 00409672923

## 2013-09-06 MED ADMIN — docusate sodium (COLACE) capsule 100 mg: ORAL | @ 13:00:00 | NDC 62584068311

## 2013-09-06 MED ADMIN — albuterol (PROVENTIL VENTOLIN) nebulizer solution 2.5 mg: RESPIRATORY_TRACT | NDC 00487950101

## 2013-09-06 MED ADMIN — levofloxacin (LEVAQUIN) tablet 750 mg: ORAL | @ 20:00:00 | NDC 68084048211

## 2013-09-06 MED ADMIN — thiamine (B-1) tablet 100 mg: ORAL | @ 13:00:00 | NDC 90011015050

## 2013-09-06 MED ADMIN — 0.9% sodium chloride with KCl 20 mEq/L infusion: INTRAVENOUS | @ 22:00:00 | NDC 00409711509

## 2013-09-06 MED ADMIN — lisinopril (PRINIVIL, ZESTRIL) tablet 40 mg: ORAL | @ 13:00:00 | NDC 68084019811

## 2013-09-06 MED ADMIN — magnesium oxide (MAG-OX) tablet 800 mg: ORAL | @ 13:00:00 | NDC 96295013573

## 2013-09-06 MED ADMIN — tramadol (ULTRAM) tablet 50 mg: ORAL | @ 22:00:00 | NDC 51079099101

## 2013-09-06 MED ADMIN — pantoprazole (PROTONIX) injection 40 mg: INTRAVENOUS | @ 01:00:00 | NDC 63323018610

## 2013-09-06 MED ADMIN — 0.9% sodium chloride with KCl 20 mEq/L infusion: INTRAVENOUS | @ 01:00:00 | NDC 00409711509

## 2013-09-06 MED ADMIN — docusate sodium (COLACE) capsule 100 mg: ORAL | @ 22:00:00 | NDC 62584068311

## 2013-09-06 MED ADMIN — albuterol (PROVENTIL VENTOLIN) nebulizer solution 2.5 mg: RESPIRATORY_TRACT | @ 11:00:00 | NDC 00487950101

## 2013-09-06 MED ADMIN — pantoprazole (PROTONIX) injection 40 mg: INTRAVENOUS | @ 13:00:00 | NDC 63323018610

## 2013-09-06 NOTE — Progress Notes (Signed)
Problem: Mobility Impaired (Adult and Pediatric)  Goal: *Acute Goals and Plan of Care (Insert Text)  LTG:  (1.)Brett Becker will move from supine to sit and sit to supine , scoot up and down and roll side to side with INDEPENDENCE within 7 day(s).   (2.)Brett Becker will transfer from bed to chair and chair to bed with INDEPENDENT using the least restrictive device within 7 day(s).   (3.)Brett Becker will ambulate with MODIFIED INDEPENDENCE for 450 feet with the least restrictive device within 7 day(s).   (4.)Brett Becker will navigate 4 steps with or without handrail use with SUPERVISION with the least restrictive device within 7 days.   ________________________________________________________________________________________________  PHYSICAL THERAPY: Daily Note, Treatment Day: 3rd and PM  INPATIENT: Medicaid : Hospital Day: 10    NAME/AGE/GENDER: Brett Becker is a 52 y.o. male  DATE: 09/06/2013  PRIMARY DIAGNOSIS: Delirium tremens  Delirium tremens  heme positive stool; anemia  Delirium tremens  Delirium tremens  Procedure(s) (LRB):  ESOPHAGOGASTRODUODENOSCOPY (EGD) (N/A)  7 Days Post-Op  Treatment Diagnosis: 787.23  INTERDISCIPLINARY COLLABORATION: Physical Therapy Assistant and Registered Nurse  ASSESSMENT:   Brett Becker presents supine and is very agreeable to therapy.  Pt performed bed mobility, stood with vc for correct hand placement, and then ambulated about 250' with RW and CGA.  Pt required verbal cues to maintain proper body posture and breathing mechanics.  Pt did well today with RW, and O2 sats ranged 90-98% during ambulation.   Pt demonstrates continued need for skilled PT due to deficits including but not limited to decreased strength, balance, transfer ability, and activity tolerance.       ????????This section established at most recent assessment??????????  PROBLEM LIST (Impairments causing functional limitations):  1. Decreased Strength affecting function  2. Decreased ADL/Functional Activities  3. Decreased  Transfer Abilities  4. Decreased Ambulation Ability/Technique  5. Decreased Balance  6. Decreased Activity Tolerance  REHABILITATION POTENTIAL FOR STATED GOALS: GOOD      PLAN OF CARE:   INTERVENTIONS PLANNED: (Benefits and precautions of physical therapy have been discussed with the Brett Becker.)  1. balance exercise  2. bed mobility  3. family education  4. gait training  5. home exercise program (HEP)  6. therapeutic activities  7. therapeutic exercise/strengthening  8. Stair climbing activities  FREQUENCY/DURATION: Follow Brett Becker 1-2 times per day/4-7 days per week until goals are met in order to address above goals.    RECOMMENDED REHABILITATION/EQUIPMENT: (at time of discharge pending progress):   Home Health: Physical Therapy.  SUBJECTIVE:   "I've been waiting on you."    Present Symptoms: No new complaints    Pain Intensity 1: 0  Pain Location 1: Shoulder  Pain Orientation 1: Left  Pain Intervention(s) 1: Medication (see MAR)  History of Present Injury/Illness: Per H&P: "History of Present Illness: Brett Becker is a 52 y.o. male without significant past medical history who is seen in consultation at the request of Dr. Arlester Marker for heme positive stool and hematemesis. The Brett Becker was admitted to ICU on 08/28/13 after feeling poorly over the past few days with decreased oral intake (including alcohol) and having a witnessed seizure in the ER. While in ICU, the Brett Becker had multiple Castles-black stools that came to be hemoccult positive and the Brett Becker reported having an episode of hematemesis in the past, not recently. Hgb has dropped 2 grams since admission. LFTs elevated on admission. The Brett Becker denies any current nausea, vomiting, or abdominal pain. He admits to occasional loose, black stools at  home. He admits to a 10-15 lb weight loss. He has some reflux, but isn't able to describe to what frequency/ severity. Takes NSAIDs occasionally for headaches. Denies family history of GI malignancy. No prior colonoscopy or  EGD.   He states he drinks a 6 pack per day, but does admit that these are the "big beers". He also drinks liquor sometimes and smokes marijuana. Denies other drug use. He denies any prior diagnosis of hepatitis C. He has one tattoo, that he gave himself "just playing" with the equipment many years ago. He denies any prior liver disease."    Prior Level of Function/Home Situation: Pt states that he got around "fairly good" prior to admission. Pt states that he would use a walking stick when going on longer walks or in unfamiliar areas. Pt also states that he would use a bicycle to get around. Pt lives in an apartment with a roommate. Pt states that his roommate has visited him while he has been in the hospital and will ask him to bring his walking stick next time he speaks with him.   Home Environment: Apartment  # Steps to Enter: 5  Rails to Enter: Yes  Hand Rails : Bilateral  Wheelchair Ramp: No  One/Two Story Residence: One story  Living Alone: No (Has roommate that has visited this Brett Becker in the hospital)  Support Systems: Child(ren);Friends \\ neighbors  Brett Becker Expects to be Discharged to:: Apartment  Current DME Used/Available at Home: Other (comment) (Walking stick)  OBJECTIVE/TREATMENT:   (In addition to Assessment/Re-Assessment sessions the following treatments were rendered)                                      [facility]??? ???6 Clicks???                                          Basic Mobility Inpatient Short Form  How much difficulty does the Brett Becker currently have... Unable A Lot A Little None   1.  Turning over in bed (including adjusting bedclothes, sheets and blankets)?   [ ]  1   [ ]  2   [ ]  3   [X]  4   2.  Sitting down on and standing up from a chair with arms ( e.g., wheelchair, bedside commode, etc.)   [ ]  1   [ ]  2   [X]  3   [ ]  4   3.  Moving from lying on back to sitting on the side of the bed?   [ ]  1   [ ]  2   [X]  3   [ ]  4               How much help from another person does the  Brett Becker currently need... Total A Lot A Little None   4.  Moving to and from a bed to a chair (including a wheelchair)?   [ ]  1   [ ]  2   [X]  3   [ ]  4   5.  Need to walk in hospital room?   [ ]  1   [ ]  2   [X]  3   [ ]  4   6.  Climbing 3-5 steps with a railing?   [ ]  1   [X]  2   [ ]   3   [ ]  4   ?? 2007, Trustees of [address], under license to [place], [place]. All rights reserved       Score:  Initial: 18 Most Recent: 18 (Date: 09/03/13 )   Interpretation of Tool:  Represents activities that are increasingly more difficult (i.e. Bed mobility, Transfers, Gait).  Score 24 23 22-20 19-15 14-10 9-7 6   Modifier CH CI CJ CK CL CM CN       ?? Mobility - Walking and Moving Around:              [phone] - CURRENT STATUS:        CK - 40%-59% impaired, limited or restricted              G8979 - GOAL STATUS:                CJ - 20%-39% impaired, limited or restricted              J1914 - D/C STATUS:                    ---------------To be determined---------------  [place] [facility] [place]  [place] [facility] [place]  Product Type: Managed Care Medicaid     Most Recent Physical Functioning:   Gross Assessment:  AROM: Within functional limits  Strength: Generally decreased, functional (LE grossly 4-/5)  Coordination: Generally decreased, functional (finger to nose slightly delayed speed bilaterally.)  Sensation: Intact  Posture:  Posture (WDL): Exceptions to WDL  Posture Assessment: Forward head;Rounded shoulders;Trunk flexion  Balance:  Sitting - Static: Good (unsupported)  Sitting - Dynamic: Good (unsupported)  Standing - Static: Fair  Standing - Dynamic : Fair  Bed Mobility:  Supine to Sit: Modified independence, requires equipment  Sit to Supine: Modified independence, requires equipment  Wheelchair Mobility:     Transfers:  Sit to Stand: Supervision  Stand to Sit: Supervision  Gait:     Base of Support: Narrowed  Speed/Cadence: Pace decreased (<100 feet/min)  Step Length: Left shortened;Right shortened  Distance (ft):  250 Feet (ft)  Assistive Device: Walker, rolling  Ambulation - Level of Assistance: CGA      Therapeutic Activity: (   15 minutes):  Therapeutic activities including bed transfers and Ambulation on level ground to improve mobility, strength and balance.  Required minimal   to promote dynamic balance in standing.     Group Therapeutic Exercise: ( ):  Exercises per grid below to improve mobility, strength and balance.  Required minimal verbal cues to promote proper body posture, promote proper body mechanics and promote proper body breathing techniques.  Progressed range and repetitions as indicated.     Date:  09/04/13 Date:  09/05/13 Date:     ACTIVITY/EXERCISE AM PM AM PM AM PM   Ambulation 1000' with RW, 250' with HHA        Standing Heel Raises X 15 B        Standing Toe Raises X 15 B        Standing Mini Squats         Standing Hip Flexion X 15 B        Standing Hip Abduction X 15 B        Standing Hamstring Curls         Standing hip extension X 15 B        Seated ankle pumps   X 15 B      Seated  LAQ   X 15 B      Seated hip flexion   X 15 B      Seated hip abd   X 15 B      UE exs with red t-band   X 15 B ea               B = bilateral; AA = active assistive; A = active; P = passive        Braces/Orthotics/Lines/Etc:   ?? O2 Device: Nasal cannula  Safety:   After treatment position/precautions:  ?? RN notified and on stretcher with transport to take him for xray  Progression/Medical Necessity:   ?? Brett Becker demonstrates good rehab potential due to higher previous functional level.  Compliance with Program/Exercises: compliant all of the time.   Reason for Continuation of Services/Other Comments:  ?? Brett Becker continues to require present interventions due to Brett Becker's inability to transfer, ambulate, and complete other functional mobility activities which are appropriate for homegoing at an acceptable level of independence. .  Recommendations/Intent for next treatment session: Treatment next visit will focus on  advancements to more challenging activities and reduction in assistance provided.  Total Treatment Duration:  Time In: 1340  Time Out: 1355  DAWN H BRACKEN, PTA

## 2013-09-06 NOTE — Progress Notes (Signed)
Problem: Dysphagia (Adult)  Goal: *Acute Goals and Plan of Care (Insert Text)  STG: Patient will safely swallow regular textures and nectar liquids by cup without overt signs or symptoms of aspiration 90% of the time. GOAL MODIFIED 09/04/13  STG: Patient will participate in Modified Barium Swallow study x1. GOAL MET 09/03/13  STG: Patient will perform laryngeal strengthening exercises x10 each with 80% accuracy.  LTG: Patient will consume least restrictive diet without respiratory compromise 100% of the time by discharge.     Speech language pathology: BEDSIDE swallow study: Daily Note 3    NAME/AGE/GENDER: Brett Becker is a 52 y.o. male  DATE: 09/06/2013  PRIMARY DIAGNOSIS: Delirium tremens  Delirium tremens  heme positive stool; anemia  Procedure(s) (LRB):  ESOPHAGOGASTRODUODENOSCOPY (EGD) (N/A) 7 Days Post-Op  Treatment Diagnosis: 787.23  INTERDISCIPLINARY COLLABORATION: n/a  PRECAUTIONS/ALLERGIES: Review of patient's allergies indicates no known allergies. ASSESSMENT/PLAN OF CARE:Patient participate with po trials of water with use of compensatory strategies.  Required moderate assistance to decreased quantity of the bolus.  Cued to apply bolus hold and effortful swallow.  Tolerated initial trials without signs/sx of aspiration.  Patient did not appear to hold the bolus with the final trial with a strong cough elitied possibly due to premature spillage.  Lunch tray then arrived and the patient was observed with mechanical soft diet/nectar liquids by cup.  Tolerated without difficulty.  The patient's roommate was present at the beginning of the session and once he left the pt was discussing how much money he is owed and his drinking problems.  When the patient was asked if he would seek help he stated, "no I'll do it by myself."  Recommend continue with current diet.  Unlikely to be compliant at discharge.  Will continue to follow for laryngeal exercises.  ????????This section established at most recent  assessment??????????  RECOMMENDATIONS AND PLANNED INTERVENTIONS (Benefits and precautions of therapy have been discussed with the patient.):  ?? PO:  Mechanical soft with chopped meat and vegetables  ?? Liquids:  nectar  MEDICATIONS:  ?? With liquid  COMPENSATORY STRATEGIES/MODIFICATIONS INCLUDING:  ?? Alternate liquids/solids  ?? Double swallow  ?? Upright for all PO  OTHER RECOMMENDATIONS (including follow up treatment recommendations):   ?? Tongue based expressions  ?? Family training/education  ?? Laryngeal exercises  ?? Patient education  FREQUENCY/DURATION: Continue to follow patient 3-5 times a week for duration of hospital stay to address above goals.SUBJECTIVE:   Cooperative.  History of Present Injury/Illness: Brett Becker  has no past medical history on file.  He also  has past surgical history that includes neurological procedure unlisted and back surgery.   Present Symptoms: intermittent coughing during bedside swallow eval   Pain Intensity 1: 0  Pain Location 1: Shoulder  Pain Orientation 1: Left  Pain Intervention(s) 1: Medication (see MAR)    Current Dietary Status:  Mechanical soft, nectar-thick liquids     History of reflux:  YES        Social History/Home Situation:    Home Environment: Apartment  # Steps to Enter: 5  Rails to Enter: Yes  Hand Rails : Bilateral  Wheelchair Ramp: No  One/Two Story Residence: One story  Living Alone: No (Has roommate that has visited this patient in the hospital)  Support Systems: Child(ren);Friends \\ neighbors  Patient Expects to be Discharged to:: Apartment  Current DME Used/Available at Home: Other (comment) (Walking stick)  OBJECTIVE:       Cognitive/Communication Status:  Mental Status  Neurologic State:  Alert  Orientation Level: Oriented to person;Oriented to place;Oriented to situation  Cognition: Follows commands  Perception: Appears intact  Perseveration: No perseveration noted  Safety/Judgement: Awareness of environment    Oral Assessment:  Oral Assessment  Labial: No  impairment  Dentition: Edentulous  Oral Hygiene: adequate  Lingual: No impairment    Vocal Quality: low volume, hypernasal       LARYNGEAL / PHARYNGEAL EXERCISES:                                                                                                                                       Dysphagia Activities: Activities/Procedures listed utilized to improve progress in swallow timeliness, swallow function, diet tolerance and swallow safety. Required moderate cueing to improve swallow safety, work toward diet advancement and decrease aspiration risk.    Tool Used: Dysphagia Outcome and Severity Scale (DOSS)    Score Comments   Normal Diet  []  7 With no strategies or extra time needed       Functional Swallow  []  6 May have mild oral or pharyngeal delay       Mild Dysphagia    []  5 Which may require one diet consistency restricted (those who demonstrate penetration which is entirely cleared on MBS would be included)   Mild-Moderate Dysphagia  [x]  4 With 1-2 diet consistencies restricted       Moderate Dysphagia  []  3 With 2 or more diet consistencies restricted       Moderately Severe Dysphagia  []  2 With partial PO strategies (trials with ST only)       Severe Dysphagia  []  1 With inability to tolerate any PO safely          Score:  Initial: 4 Most Recent: 4 (Date: 09/04/13 )   Interpretation of Tool: The Dysphagia Outcome and Severity Scale (DOSS) is a simple, easy-to-use, 7-point scale developed to systematically rate the functional severity of dysphagia based on objective assessment and make recommendations for diet level, independence level, and type of nutrition.     Score 7 6 5 4 3 2 1    Modifier CH CI CJ CK CL CM CN     Swallowing:    E4540 - CURRENT STATUS: CK - 40%-59% impaired, limited or restricted   J8119 - GOAL STATUS:  CI - 1%-19% impaired, limited or restricted   J4782 - D/C STATUS:  ---------------To be determined---------------  [place] [employer] [facility] [place]  [place] [facility] [place]  Product  Type: Managed Care Medicaid    __________________________________________________________________________________________________  Safety:   After treatment position/precautions:  ?? Patient upright in chair eating his lunch  Recommendations for treatment: laryngeal exercises, training in safe swallowing strategies  Total Treatment Duration:  Time In: 1210   Time Out: 1239    Reginia Naas MS, CCC-SLP

## 2013-09-06 NOTE — Progress Notes (Addendum)
Brett Becker  Admission Date: 08/28/2013             Daily Progress Note: 09/06/2013    The patient's chart is reviewed and the patient is discussed with the staff.    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 Off oxygen.     Current Facility-Administered Medications   Medication Dose Route Frequency   ??? magnesium sulfate 4 g/100 ml IVPB  4 g IntraVENous ONCE   ??? levofloxacin (LEVAQUIN) tablet 750 mg  750 mg Oral Q24H   ??? magnesium oxide (MAG-OX) tablet 800 mg  800 mg Oral BID   ??? docusate sodium (COLACE) capsule 100 mg  100 mg Oral BID   ??? chlordiazePOXIDE (LIBRIUM) capsule 5 mg  5 mg Oral BID   ??? albuterol (PROVENTIL VENTOLIN) nebulizer solution 2.5 mg  2.5 mg Nebulization BID RT   ??? thiamine (B-1) tablet 100 mg  100 mg Oral DAILY   ??? multivitamin, stress formula (STRESS TAB) tablet 1 tablet  1 tablet Oral DAILY   ??? albuterol (PROVENTIL VENTOLIN) nebulizer solution 2.5 mg  2.5 mg Nebulization Q6H PRN   ??? acetaminophen (TYLENOL) tablet 500 mg  500 mg Oral Q4H PRN   ??? tramadol (ULTRAM) tablet 50 mg  50 mg Oral Q6H PRN   ??? amlodipine (NORVASC) tablet 5 mg  5 mg Oral DAILY   ??? hydralazine (APRESOLINE) 20 mg/mL injection 20 mg  20 mg IntraVENous Q6H PRN   ??? pantoprazole (PROTONIX) injection 40 mg  40 mg IntraVENous Q12H   ??? lisinopril (PRINIVIL, ZESTRIL) tablet 40 mg  40 mg Oral DAILY   ??? NUTRITIONAL SUPPORT ELECTROLYTE PRN ORDERS   Does Not Apply PRN   ??? sodium chloride (NS) flush 5-10 mL  5-10 mL IntraVENous Q8H   ??? sodium chloride (NS) flush 5-10 mL  5-10 mL IntraVENous PRN   ??? lorazepam (ATIVAN) injection 1 mg  1 mg IntraVENous Q1H PRN   ??? haloperidol lactate (HALDOL) injection 1 mg  1 mg IntraVENous Q2H PRN   ??? ondansetron (ZOFRAN) injection 4 mg  4 mg IntraVENous Q6H PRN   ??? pneumococcal 23-valent (PNEUMOVAX 23) injection 0.5 mL  0.5 mL IntraMUSCular PRIOR TO DISCHARGE   ??? 0.9% sodium chloride with KCl 20 mEq/L infusion    IntraVENous CONTINUOUS   ??? nicotine (NICODERM CQ) 21 mg/24 hr patch 1 patch  1 patch TransDERmal DAILY         Objective:     Filed Vitals:    09/05/13 2316 09/06/13 0449 09/06/13 0708 09/06/13 0731   BP: 135/88 130/85 127/78    Pulse: 74 74 71    Temp: 98.3 ??F (36.8 ??C) 97.8 ??F (36.6 ??C) 97.6 ??F (36.4 ??C)    Resp: 18 18 18     Height:       Weight:       SpO2: 98% 97% 95% 97%     Intake and Output:   09/23 1900 - 09/25 0659  In: 3891 [P.O.:480; I.V.:3411]  Out: 5350 [Urine:5350]  09/25 0700 - 09/25 1859  In: 240 [P.O.:240]  Out: -     Physical Exam:   Constitutional:  the patient is thin, well developed and in no acute distress  HEENT:  Sclera clear, pupils equal, oral mucosa moist  Lungs: clear bilaterally. Now on room air.   Cardiovascular:  RRR without M,G,R  Abd/GI: soft and non-tender; with positive bowel sounds.  Ext: warm without cyanosis. There is no lower leg edema.  Musculoskeletal: moves all four extremities with equal strength  Skin:  no jaundice or rashes, no wounds   Neuro: no gross neuro deficits   Musculoskeletal: can ambulate. No deformity  Psychiatric:  alert and oriented. Calm.     ROS: pain left shoulder, fair appetite, weak, denies dyspnea  Lines: peripheral IV    CHEST XRAY: none today    LAB  Recent Labs      09/06/13   0650  09/04/13   0442   WBC   --   5.9   HGB  12.5*  10.6*   HCT   --   30.5*   PLT   --   230     Recent Labs      09/06/13   0650  09/04/13   0442  09/03/13   1625   NA  134*  134*   --    K  4.1  3.6   --    CL  100  101   --    CO2  26  24   --    GLU  105*  95   --    BUN  4*  5*   --    CREA  0.84  0.75*   --    MG  1.2*  1.3*  1.6*   PHOS   --   4.1   --        Assessment:  (Medical Decision Making)     Hospital Problems Date Reviewed: 09/04/2013        ICD-9-CM Class Noted POA    Aspiration pneumonia 507.0  09/03/2013 No    Bilateral infiltrates on  CT. Fever resolved and oxygenation better with abx    Marijuana abuse (Chronic) 305.20  09/02/2013 Yes    noted    Hypoxia  799.02  09/02/2013 No    better    Abnormal CXR 793.19  09/02/2013 No    As above    Gastric ulcer 531.90  08/31/2013 Yes    Recent EGD    Hepatitis C (Chronic) 070.70  08/31/2013 Yes    noted    Occult blood positive stool 792.1  08/30/2013 Yes    Related to above    HTN (hypertension) (Chronic) 401.9  08/30/2013 Yes    controlled    *Delirium tremens 291.0  08/28/2013 Yes    Resolved. Alcohol abuse as outpatient    Macrocytosis 289.89  08/28/2013 Yes        Hyponatremia 276.1  08/28/2013 Yes    Better and stable    Hypomagnesemia 275.2  08/28/2013 Yes    Still low    Transaminitis 790.4  08/28/2013 Yes        Alcohol abuse (Chronic) 305.00  08/28/2013 Yes              Plan:  (Medical Decision Making)   1. Check room air sat with exercise. Hopefully can discontinue  2. Day 4 abx - switch to oral abx yesterday. Fever resolved  3. Follow up with [place] for PFTs - severe COPD per CT  4, IV fluids per hospitalist  5. Supplementing MG but still low  6. Follow up with GI - repeat EGD - on PPI    Terisa Starr, NP    Lungs: CTA b/l.   Heart S1 and S2 audible, no murmers or rubs appreciated    Will sign off, f/u as outpatient in  two week.      I have spoken with and examined the patient. I have reviewed the history, examination, assessment, and plan and agree with the above.    Akshaya Toepfer Mcarthur Rossetti, MD

## 2013-09-06 NOTE — Progress Notes (Signed)
Pt has had an uneventful night. Pt has been afebrile tonight. Pt has been alert and orientated x 4 tonight. Pt has been on oxygen through out the night on 2 Liters. Needs met at this time.

## 2013-09-06 NOTE — Progress Notes (Signed)
Progress Note    Patient: Brett Becker MRN: [id number]  SSN: [id number]    Date of Birth: Mar 01, 1961  Age: 52 y.o.  Sex: male      Admit Date: 08/28/2013    LOS: 9 days     Subjective:   52 yo male admitted for seizures.  Patient seizure 2/2 to etoh withdrawal, s/p ICU for DTs.  Being treated for aspiration pna by pulm.    Patient now complaining of left shoulder pain.    Objective:     Filed Vitals:    09/05/13 2316 09/06/13 0449 09/06/13 0708 09/06/13 0731   BP: 135/88 130/85 127/78    Pulse: 74 74 71    Temp: 98.3 ??F (36.8 ??C) 97.8 ??F (36.6 ??C) 97.6 ??F (36.4 ??C)    Resp: 18 18 18     Height:       Weight:       SpO2: 98% 97% 95% 97%        Intake and Output:  Current Shift: 09/25 0700 - 09/25 1859  In: 240 [P.O.:240]  Out: -   Last three shifts: 09/23 1900 - 09/25 0659  In: 3891 [P.O.:480; I.V.:3411]  Out: 5350 [Urine:5350]    Physical Exam:   GENERAL: alert, cooperative, no distress, appears stated age  EYE: negative  LUNG: clear to auscultation bilaterally  HEART: regular rate and rhythm, S1, S2 normal, no murmur, click, rub or gallop  ABDOMEN: soft, non-tender. Bowel sounds normal. No masses,  no organomegaly  EXTREMITIES:  extremities normal, atraumatic, no cyanosis or edema  NEUROLOGIC: negative  PSYCHIATRIC: non focal    Lab/Data Review:  BMP:   Lab Results   Component Value Date/Time    NA 134* 09/06/2013  6:50 AM    K 4.1 09/06/2013  6:50 AM    CL 100 09/06/2013  6:50 AM    CO2 26 09/06/2013  6:50 AM    AGAP 8 09/06/2013  6:50 AM    GLU 105* 09/06/2013  6:50 AM    BUN 4* 09/06/2013  6:50 AM    CREA 0.84 09/06/2013  6:50 AM    GFRAA >60 09/06/2013  6:50 AM    GFRNA >60 09/06/2013  6:50 AM     CBC:   Lab Results   Component Value Date/Time    HGB 12.5* 09/06/2013  6:50 AM            Assessment:     Principal Problem:    Delirium tremens (08/28/2013)    Active Problems:    Macrocytosis (08/28/2013)      Hyponatremia (08/28/2013)      Hypomagnesemia (08/28/2013)      Transaminitis (08/28/2013)      Alcohol abuse (08/28/2013)       Occult blood positive stool (08/30/2013)      HTN (hypertension) (08/30/2013)      Gastric ulcer (08/31/2013)      Hepatitis C (08/31/2013)      Marijuana abuse (09/02/2013)      Hypoxia (09/02/2013)      Abnormal CXR (09/02/2013)      Aspiration pneumonia (09/03/2013)        Plan:   Will monitor  Will check shoulder x-ray  Thanks consultants for assistance    Plan of care discussed with: patient/nursing staff  Signed By: Rocco Pauls, MD     September 06, 2013

## 2013-09-06 NOTE — Progress Notes (Signed)
Problem: Gas Exchange - Impaired  Goal: *Absence of hypoxia  Outcome: Progressing Towards Goal  Removed O2 -SAT 97%, now on RA

## 2013-09-07 LAB — PHOSPHORUS: Phosphorus: 4.1 MG/DL (ref 2.5–4.5)

## 2013-09-07 LAB — METABOLIC PANEL, BASIC
Anion gap: 9 mmol/L (ref 7–16)
BUN: 6 MG/DL (ref 6–23)
CO2: 24 mmol/L (ref 21–32)
Calcium: 8.2 MG/DL — ABNORMAL LOW (ref 8.3–10.4)
Chloride: 100 mmol/L (ref 98–107)
Creatinine: 0.87 MG/DL (ref 0.8–1.5)
GFR est AA: >60 mL/min/{1.73_m2} (ref >60)
GFR est non-AA: >60 mL/min/{1.73_m2} (ref >60)
Glucose: 99 mg/dL (ref 65–100)
Potassium: 3.8 mmol/L (ref 3.5–5.1)
Sodium: 133 mmol/L — ABNORMAL LOW (ref 136–145)

## 2013-09-07 LAB — MAGNESIUM: Magnesium: 1.4 mg/dL — CL (ref 1.8–2.4)

## 2013-09-07 LAB — CULTURE, BLOOD

## 2013-09-07 LAB — CBC WITH AUTOMATED DIFF
ABS. BASOPHILS: 0 10*3/uL (ref 0.0–0.2)
ABS. EOSINOPHILS: 0.3 10*3/uL (ref 0.0–0.8)
ABS. IMM. GRANS.: 0 10*3/uL (ref 0.0–0.5)
ABS. LYMPHOCYTES: 1.5 10*3/uL (ref 0.5–4.6)
ABS. MONOCYTES: 0.7 10*3/uL (ref 0.1–1.3)
ABS. NEUTROPHILS: 2.8 10*3/uL (ref 1.7–8.2)
BASOPHILS: 1 % (ref 0.0–2.0)
EOSINOPHILS: 6 % (ref 0.5–7.8)
HCT: 30.6 % — ABNORMAL LOW (ref 41.1–50.3)
HGB: 10.5 g/dL — ABNORMAL LOW (ref 13.6–17.2)
IMMATURE GRANULOCYTES: 0.4 % (ref 0.0–5.0)
LYMPHOCYTES: 28 % (ref 13–44)
MCH: 33.3 PG — ABNORMAL HIGH (ref 26.1–32.9)
MCHC: 34.3 g/dL (ref 31.4–35.0)
MCV: 97.1 FL (ref 79.6–97.8)
MONOCYTES: 13 % — ABNORMAL HIGH (ref 4.0–12.0)
MPV: 8.9 FL — ABNORMAL LOW (ref 10.8–14.1)
NEUTROPHILS: 52 % (ref 43–78)
PLATELET: 374 10*3/uL (ref 150–450)
RBC: 3.15 M/uL — ABNORMAL LOW (ref 4.23–5.67)
RDW: 12.4 % (ref 11.9–14.6)
WBC: 5.4 10*3/uL (ref 4.3–11.1)

## 2013-09-07 MED ADMIN — tramadol (ULTRAM) tablet 50 mg: ORAL | @ 03:00:00 | NDC 62584055911

## 2013-09-07 MED ADMIN — docusate sodium (COLACE) capsule 100 mg: ORAL | @ 12:00:00 | NDC 62584068311

## 2013-09-07 MED ADMIN — multivitamin, stress formula (STRESS TAB) tablet 1 tablet: ORAL | @ 12:00:00

## 2013-09-07 MED ADMIN — pantoprazole (PROTONIX) injection 40 mg: INTRAVENOUS | @ 01:00:00 | NDC 63323018610

## 2013-09-07 MED ADMIN — amlodipine (NORVASC) tablet 5 mg: ORAL | @ 12:00:00 | NDC 68084025911

## 2013-09-07 MED ADMIN — albuterol (PROVENTIL VENTOLIN) nebulizer solution 2.5 mg: RESPIRATORY_TRACT | @ 12:00:00 | NDC 00487950101

## 2013-09-07 MED ADMIN — lisinopril (PRINIVIL, ZESTRIL) tablet 40 mg: ORAL | @ 12:00:00 | NDC 68084019811

## 2013-09-07 MED ADMIN — pneumococcal 23-valent (PNEUMOVAX 23) injection 0.5 mL: INTRAMUSCULAR | @ 14:00:00 | NDC 00006494301

## 2013-09-07 MED ADMIN — magnesium sulfate 2 g/50 ml IVPB (premix or compounded): INTRAVENOUS | @ 13:00:00 | NDC 00409672924

## 2013-09-07 MED ADMIN — magnesium oxide (MAG-OX) tablet 800 mg: ORAL | @ 12:00:00 | NDC 96295013573

## 2013-09-07 MED ADMIN — pantoprazole (PROTONIX) injection 40 mg: INTRAVENOUS | @ 12:00:00 | NDC 63323018610

## 2013-09-07 MED ADMIN — chlordiazePOXIDE (LIBRIUM) capsule 5 mg: ORAL | @ 12:00:00 | NDC 51079037401

## 2013-09-07 MED ADMIN — sodium chloride (NS) flush 5-10 mL: INTRAVENOUS | @ 02:00:00 | NDC 87701099893

## 2013-09-07 MED ADMIN — sodium chloride (NS) flush 5-10 mL: INTRAVENOUS | @ 09:00:00 | NDC 87701099893

## 2013-09-07 MED ADMIN — albuterol (PROVENTIL VENTOLIN) nebulizer solution 2.5 mg: RESPIRATORY_TRACT | @ 02:00:00 | NDC 00487950101

## 2013-09-07 MED ADMIN — sodium chloride (NS) flush 5-10 mL: INTRAVENOUS | @ 10:00:00 | NDC 87701099893

## 2013-09-07 MED ADMIN — magnesium sulfate 2 g/50 ml IVPB (premix or compounded): INTRAVENOUS | @ 10:00:00 | NDC 00409672924

## 2013-09-07 MED ADMIN — thiamine (B-1) tablet 100 mg: ORAL | @ 12:00:00 | NDC 90011015050

## 2013-09-07 NOTE — Discharge Summary (Signed)
Physician Discharge Summary     Patient ID:  Brett Becker  [id number]  52 y.o.  07-28-1961    Admit date: 08/28/2013    Discharge date and time: 09/07/2013    Admission Diagnoses: Delirium tremens  Delirium tremens  heme positive stool; anemia    Discharge Diagnoses:  Principal Diagnosis Delirium tremens                                            Principal Problem:    Delirium tremens (08/28/2013)    Active Problems:    Macrocytosis (08/28/2013)      Hyponatremia (08/28/2013)      Hypomagnesemia (08/28/2013)      Transaminitis (08/28/2013)      Alcohol abuse (08/28/2013)      Occult blood positive stool (08/30/2013)      HTN (hypertension) (08/30/2013)      Gastric ulcer (08/31/2013)      Hepatitis C (08/31/2013)      Marijuana abuse (09/02/2013)      Hypoxia (09/02/2013)      Abnormal CXR (09/02/2013)      Aspiration pneumonia (09/03/2013)           Hospital Course: Patient was admitted for nausea and vomiting and seizure activity.  Patient has a history of significant alcohol abuse and his seizures were likely due to withdrawal.  Patient then with through DTs and placed in the ICU patient was eventually tapered with librium and at time of discharge has not had a repeat episode.  While in the hospital patient stool was positive for blood and GI was consulted.  An EGD done showed large gastric ulcer, recommendation were for patient to stop drinking and to take protonix bid.   He was also found to be H pylori positive.  He will be sent on triple therapy for treatment for 14 days. There was concern for possible pneumothorax and pulmonary was consulted however CT chest showed evidence of extensive bullae and no intervention was needed.  Patient also had an episode of aspiration pneumonia and will be treated with levaquin.  Patient has also complained of left shoulder pain a xray did not show any evidence of structural damage indicating possible rotator cuff injury.  Patient was also started on norvasc and lisinopril to control his  blood pressure. Patient was encouraged to quit his polysubstance abuse and has been give pamphlets for cessation.    PCP: Unknown    Consults: Pulmonary/Intensive care and GI    Significant Diagnostic Studies:   Portable chest xray   Comparison: 08/28/13   Indication: Shortness of breath   Findings: The cardiac and mediastinal contours are within normal limits.   Increased background reticular markings again identified with bullous change and   scarring at the lung bases. There may have been interval development of small   loculated pneumothorax left lung base. No discrete osseous abnormality on the   single view.   IMPRESSION: Suspect small loculated pneumothorax left lung base. CT chest   without contrast could be performed for further evaluation. Increased background   reticular markings are nonspecific.     CT Chest dated 09/02/2013   Clinical Information: Shortness of breath   Technique:   2.45mm axial images of the lungs were obtained using CT Pulmonary Embolus   Protocol. 100 cc Omnipaque 350 used for intravenous contrast to visualize   pulmonary vessels.  Findings:   No pulmonary emboli. 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 These are most prominent in the lower   lung zones. There are areas of infiltrate in both lower lobes, right middle lobe   and left upper lobe. No pleural effusion. No thoracic dissection. No   mediastinal or hilar adenopathy. Lower images do not show adrenal enlargement   Impression:   1. No pulmonary emboli.   2. 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 Bilateral infiltrates.    MODIFIED BARIUM SWALLOW.   HISTORY: Dysphagia. Feeding difficulties.   TECHNIQUE: Fluoroscopic guidance was provided for the speech pathologist.   Multiple consistencies of barium were given.   FINDINGS: No tracheal aspiration. Please see speech pathologist report for   further details.   Fluoroscopy time: 2.2 minutes.   IMPRESSION: No tracheal  aspiration.    THREE-VIEW LEFT SHOULDER:   CLINICAL HISTORY: Shoulder pain with remote injury.   COMPARISON: None.   FINDINGS: No definite fracture, malalignment, or frank bone destruction is   evident. No persistent radiopaque foreign body is seen. 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   IMPRESSION: Acromioclavicular osteoarthritis with inferior spurring may   predispose to impingement. Followup MRI would be useful for further evaluation   of possible associated rotator cuff pathology, if clinically indicated.    Abdominal Ultrasound   INDICATION: hep c positive, eval for cirrhosis or ascites   FINDINGS: There is increased echogenicity without pericholecystic fluid. Liver   without focal lesions. The spleen is of normal echotexture, measuring 8 cm..   There is no bile duct dilatation. The common bile duct measures 4 mm. Sludge is   seen within the gallbladder without gallbladder wall thickening.   The right kidney measures 13.0 cm in length. The left kidney measures 11.9 cm.   There is no hydronephrosis. There is no evidence of a renal mass. There is no   ascites. The aorta and inferior vena cava are normal in caliber.   IMPRESSION:   1. The findings suggest hepatic steatosis.   2. Sludge within the gallbladder.    EGD  Multiple large gastric ulcers - no active bleeding or high risk stigmata.  No biopsy or intervention. Non-obstructive Schatzki's ring.     Discharge Exam:  BP 109/70   Pulse 61   Temp(Src) 98 ??F (36.7 ??C)   Resp 16   Ht 5\' 8"  (1.727 m)   Wt 55.2 kg (121 lb 11.1 oz)   BMI 18.51 kg/m2   SpO2 96%  General appearance: alert, cooperative, no distress, appears stated age, cachectic  Head: Normocephalic, without obvious abnormality, atraumatic  Eyes: negative  Lungs: diminished breath sounds R anterior, R base, L anterior, L base  Heart: regular rate and rhythm, S1, S2 normal, no murmur, click, rub or gallop  Abdomen: soft, non-tender. Bowel sounds normal.  No masses,  no organomegaly  Extremities: extremities normal, atraumatic, no cyanosis or edema  Neurologic: Grossly normal    Disposition: home    Patient Instructions:   Current Discharge Medication List      START taking these medications    Details   amlodipine (NORVASC) 5 mg tablet Take 1 tablet by mouth daily for 30 days.  Qty: 30 tablet, Refills: 0      levofloxacin (LEVAQUIN) 750 mg tablet Take 1 tablet by mouth every twenty-four (24) hours for 6 days.  Qty: 6 tablet, Refills: 0  lisinopril (PRINIVIL, ZESTRIL) 40 mg tablet Take 1 tablet by mouth daily for 30 days.  Qty: 30 tablet, Refills: 0      magnesium oxide (MAG-OX) 400 mg tablet Take 2 tablets by mouth two (2) times a day for 10 days.  Qty: 40 tablet, Refills: 0      pantoprazole (PROTONIX) 40 mg tablet Take 1 tablet by mouth two (2) times a day for 30 days.  Qty: 60 tablet, Refills: 0         CONTINUE these medications which have NOT CHANGED    Details   naproxen (NAPROSYN) 500 mg tablet Take 500 mg by mouth two (2) times daily (with meals).           Activity: Activity as tolerated  Diet: Regular Diet  Wound Care: None needed    Follow-up with PCP in 1 week, Pulmonary in 2 weeks, GI in 1 month  Follow-up tests/labs none    Time spent: 40 minutes    Signed:  Rocco Pauls, MD  09/07/2013  9:15 AM

## 2013-09-07 NOTE — Progress Notes (Signed)
Pt has had an uneventful night. Pt has been sleeping most of the night and ambulating well in room to the bathroom. Pt was given Ultram PO for his shoulder pain tonight. Needs met at this time.

## 2013-09-07 NOTE — Progress Notes (Signed)
Discharge instructions given to patient. Patient verbalized understanding. Peripheral IV removed.

## 2013-09-08 LAB — CULTURE, BLOOD: Culture result:: NO GROWTH

## 2013-09-12 ENCOUNTER — Encounter (INDEPENDENT_AMBULATORY_CARE_PROVIDER_SITE_OTHER): Payer: PPO | Admitting: Internal Medicine

## 2013-09-18 ENCOUNTER — Ambulatory Visit (INDEPENDENT_AMBULATORY_CARE_PROVIDER_SITE_OTHER): Payer: PPO | Admitting: Family Medicine

## 2013-09-18 VITALS — BP 165/106 | HR 108 | Temp 98.0°F

## 2013-09-18 MED ORDER — OXYCODONE-ACETAMINOPHEN 5-325 MG OR TABS
1.0000 | ORAL_TABLET | Freq: Four times a day (QID) | ORAL | Status: DC | PRN
Start: 2013-09-18 — End: 2013-12-04

## 2013-09-18 MED ORDER — INSULIN PEN NEEDLE 31G X 8 MM MISC
Status: DC
Start: 2013-09-18 — End: 2013-12-04

## 2013-09-18 NOTE — Patient Instructions (Addendum)
Thank you for visiting [facility] for your medical care today.  Your visit was with Dr. Hiram Comber.        Issues discussed today:    Thoracic spine fracture.  Severe pain    Plan:  Referral to spine doctor.  Okey Regal here will help coordinate appointment.   Narcotic pain medications as needed.  Caution driving. These are addictive.    Modified work activities until further notice.     Follow up:  RETURN TO CLINIC if no improvement or change.     Please call the office if you have any further questions or concerns.    Dr. Hiram Comber MD  [facility]   [address] [place], [place] [address]  [phone]

## 2013-09-18 NOTE — Progress Notes (Signed)
Roger [name] presents with back pain    HPI:   Location/Laterality: mid back radiating to right side.   Severity:8/10  Quality: aching and sharp with movement.   Timing:fall August 11.   Context:fall and seen in ER last month and diagnosed with fracture.   Slowly getting better.  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   Modifying factors: aleve does help some. Oxycodone did help, was given in the ED.   Associated signs/symptoms: some hip pain.       No smoking.     Reviewed smoking history:   History   Smoking status   . Never Smoker    Smokeless tobacco   . Not on file       Reviewed problem list:  Patient Active Problem List   Diagnosis   . HYPERTENSION NOS   . HYPERCALCEMIA   . ELEV TRANSAMINASE/LDH   . FAMILY HX GI MALIGNANCY   . Diabetes mellitus with nephropathy   . Anxiety   . Prostate cancer       Reviewed past medical history:  Past Medical History   Diagnosis Date   . Unspecified essential hypertension 1996     on 3 drugs, some side effects from medications.    . Family history of colon cancer      brother died in 16s of colon cancer    . Type II or unspecified type diabetes mellitus without mention of complication, not stated as uncontrolled 2013       Reviewed medications:  Outpatient Prescriptions Prior to Visit   Medication Sig Dispense Refill   . AmLODIPine Besylate 10 MG Oral Tab Take 1 tablet (10 mg) by mouth daily.  30 tablet  3   . Aspirin 81 MG Oral Tab Take 1 tablet (81 mg) by mouth daily.  30 tablet     . B-D ULTRAFINE III SHORT PEN 31G X 8 MM Does not apply Misc for insulin 3 needles a day       . Blood Glucose Monitoring Suppl (BLOOD GLUCOSE MONITOR KIT) Does not apply Kit one meter for testing 1-3 times per day  1 Kit  11   . Glucose Blood (BLOOD GLUCOSE TEST STRIPS) In Vitro Strip test up to 3 times per day  100 Strip  11   . Lancets Thin Does not apply Misc for testing up to 3 times per day  200 Package  11   . Lisinopril 20 MG Oral Tab Take 1 tablet (20 mg) by  mouth daily.  90 tablet  3   . MetFORMIN HCl 1000 MG Oral Tab TAKE 1 TABLET BY MOUTH DAILY FOR BLOOD SUGAR  90 Tab  0   . Multiple Vitamins-Minerals (MULTIVITAMIN & MINERAL OR) 1 tab a day       . Oxycodone-Acetaminophen 5-325 MG Oral Tab Take 1 tablet by mouth every 6 hours as needed for pain. For Pain.  10 tablet  0   . Tadalafil 5 MG Oral Tab 1 po qday  30 Tab  5     No facility-administered medications prior to visit.       Review of Systems   Gastrointestinal:        No saddle anesthesia, no loss of bowel function.   Genitourinary: Negative for difficulty urinating.     BP 165/106  Pulse 108  Temp(Src) 98 F (36.7 C) (Temporal)  SpO2 95%    Physical Exam   Constitutional: Vital signs are normal. No  distress.   Musculoskeletal:        Thoracic back: He exhibits decreased range of motion, tenderness, bony tenderness and spasm.        Lumbar back: He exhibits tenderness. He exhibits no bony tenderness.   Negative STRAIGHT LEG RAISE bilaterally.         (805.2) Thoracic spine fracture, closed, initial encounter  (primary encounter diagnosis)  Plan: REFERRAL TO ORTHOPEDICS  Fall was Aug 11.  Still significant pain.  Very physical job likely aggravating healing.  So ortho for management of fracture.  Modified activities at work.  Pain medications as needed.  Patient BLOOD PRESSURE is up today likely from aleve usage and amount of pain.  Ice/heat as needed.  Message sent to referral coordinator about follow up for fracture at [facility].      (250.40,  583.81) Diabetes mellitus with nephropathy  Plan: Insulin Pen Needle (BD PEN NEEDLE SHORT U/F)         31G X 8 MM Does not apply Misc       (724.5) Acute back pain  Plan: Oxycodone-Acetaminophen 5-325 MG Oral Tab    Discussed SIDE EFFECTS, keep medications safe.  Counseled on risk of addiction.     Patient agreeable to plan.   See instructions.   RETURN TO CLINIC if no improvement or change.

## 2013-09-18 NOTE — Progress Notes (Signed)
Pt is here to discuss back pain.  States he has had a fall.  Seen at ER in [place].  Pain scale 8/10.

## 2013-09-21 ENCOUNTER — Other Ambulatory Visit: Payer: Self-pay | Admitting: Internal Medicine

## 2013-09-21 MED ORDER — ONETOUCH LANCETS MISC
Status: DC
Start: 2013-09-21 — End: 2013-11-05

## 2013-09-25 ENCOUNTER — Other Ambulatory Visit (INDEPENDENT_AMBULATORY_CARE_PROVIDER_SITE_OTHER): Payer: Self-pay | Admitting: Family Medicine

## 2013-09-25 NOTE — Telephone Encounter (Signed)
In addition to the Oxycodone request, patient states that Dr. Ninfa Meeker offered muscle relaxer's for back pain as well. Please add muscle relaxer prescription to order, if possible.

## 2013-09-26 NOTE — Telephone Encounter (Signed)
Patient calling and asking if he can see Dr. Ninfa Meeker since she saw him last and wrote the prescription, because 10/19/13 is too long for him to wait to get refills.    Dr. Ninfa Meeker, should patient see Dr. Carmina Miller?    Please advise

## 2013-09-26 NOTE — Telephone Encounter (Signed)
Refills of oxycodone must be done in office visit.

## 2013-09-26 NOTE — Telephone Encounter (Signed)
Your patient has asked ZOX:WRUEAVW Percocet 5/325    Directions: 1-2 Q6H prn    Quantity: 40    Last filled: 09/18/13    ** Pt also requested muscle relaxer    Routing to provider as refill center can't authorize CIIs.

## 2013-09-26 NOTE — Telephone Encounter (Signed)
Called patient left voicemail to call the clinic back.    CCR-patient calls back please schedule the next opening with Dr Carmina Miller to discuss refill on meds

## 2013-09-27 NOTE — Telephone Encounter (Signed)
Patient scheduled with Dr. Carmina Miller tomorrow to discuss request.

## 2013-09-28 ENCOUNTER — Encounter (INDEPENDENT_AMBULATORY_CARE_PROVIDER_SITE_OTHER): Payer: PPO | Admitting: Internal Medicine

## 2013-10-08 ENCOUNTER — Encounter (HOSPITAL_BASED_OUTPATIENT_CLINIC_OR_DEPARTMENT_OTHER): Payer: Self-pay | Admitting: Registered Nurse

## 2013-10-08 ENCOUNTER — Ambulatory Visit: Payer: PPO | Attending: Registered Nurse | Admitting: Registered Nurse

## 2013-10-08 VITALS — BP 136/90 | HR 94 | Wt 202.7 lb

## 2013-10-08 DIAGNOSIS — N529 Male erectile dysfunction, unspecified: Secondary | ICD-10-CM | POA: Insufficient documentation

## 2013-10-08 DIAGNOSIS — C61 Malignant neoplasm of prostate: Secondary | ICD-10-CM | POA: Insufficient documentation

## 2013-10-08 MED ORDER — ALPROSTADIL (VASODILATOR) 20 MCG IC KIT
PACK | INTRACAVERNOUS | Status: DC
Start: 2013-10-08 — End: 2014-03-15

## 2013-10-08 MED ORDER — TADALAFIL 5 MG OR TABS
5.0000 mg | ORAL_TABLET | Freq: Every day | ORAL | Status: DC | PRN
Start: 2013-10-08 — End: 2013-12-04

## 2013-10-08 MED ORDER — TADALAFIL 5 MG OR TABS
ORAL_TABLET | ORAL | Status: DC
Start: 2013-10-08 — End: 2014-05-01

## 2013-10-08 NOTE — Progress Notes (Signed)
[facility] NEW PATIENT VISIT    ID/CC:    Chief Complaint   Patient presents with   . Penis/Scrotum Problem   . Other     Injection teaching/VED       History of Present Illness:   Roger [name] is a 52 year old male who presents to me for organic erectile dysfunction.  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  Reports he had no problems getting or maintaining erections prior to his surgery, has not gotten good erections since.  Specifically, he gets almost no erections on his own, has been on daily CIALIS 1st two months post-op without getting better erections, gets erections 4-5/10 with VED but doesn't like using, has never tried on-demand PDEI medication.  Is able to achieve orgasm, frequently "ejaculates" Iie loses urine) with orgasm.  Wants to know what can be done to restore erectile function, also how to get better erections at present.      Review of Systems:   A 14 system review of system was filled out by the patient.  Positives are noted in the HPI. The rest of the review of systemS is negative.     Past Medical Hx:    Past Medical History   Diagnosis Date   . Unspecified essential hypertension 1996     on 3 drugs, some side effects from medications.    . Family history of colon cancer      brother died in 13s of colon cancer    . Type II or unspecified type diabetes mellitus without mention of complication, not stated as uncontrolled 2013   . History of erectile dysfunction      Patient Active Problem List   Diagnosis   . HYPERTENSION NOS   . HYPERCALCEMIA   . ELEV TRANSAMINASE/LDH   . FAMILY HX GI MALIGNANCY   . Diabetes mellitus with nephropathy   . Anxiety   . Prostate cancer   . Erectile dysfunction       Past Surgical Hx:    Past Surgical History   Procedure Laterality Date   . Colonoscopy stoma dx w/wo collj spec spx  2003     [facility], normal   . Colonoscopy stoma dx w/wo collj spec spx  2011   . Prostate surgery         Family and Social Hx:    Roger [name] , family history includes Cancer in his  brother and Hypertension in his mother.Marland Kitchen He  reports that he has never smoked. He does not have any smokeless tobacco history on file. He reports that he drinks about 6.0 ounces of alcohol per week. He reports that he does not use illicit drugs..  History     Social History Narrative    Date info entered: 05/23/2012    Reviewed 12/08/12    Living situation:  lives w/ wife (medical issues on disability, pt is caretaker for wife) and youngest son (48); older son 88 lives in [place]. High stress taking care of wife. etoh 5 drinks/night on avg    Work for the [employer] in [employer]; active job    Exercise: treadmill at home + core exercises, 3x/wk for 30 min is goal    [employer] or spiritual support: yes                           Active Meds:    Outpatient Prescriptions Marked as Taking for the  10/08/13 encounter (Office Visit) with Reggie Pile   Medication Sig Dispense Refill   . AmLODIPine Besylate 10 MG Oral Tab Take 1 tablet (10 mg) by mouth daily.  30 tablet  3   . Aspirin 81 MG Oral Tab Take 1 tablet (81 mg) by mouth daily.  30 tablet     . Blood Glucose Monitoring Suppl (BLOOD GLUCOSE MONITOR KIT) Does not apply Kit one meter for testing 1-3 times per day  1 Kit  11   . Glucose Blood (BLOOD GLUCOSE TEST STRIPS) In Vitro Strip test up to 3 times per day  100 Strip  11   . Insulin Pen Needle (BD PEN NEEDLE SHORT U/F) 31G X 8 MM Does not apply Misc for insulin 3 needles a day  3 Box  50   . Lisinopril 20 MG Oral Tab Take 1 tablet (20 mg) by mouth daily.  90 tablet  3   . MetFORMIN HCl 1000 MG Oral Tab TAKE 1 TABLET BY MOUTH DAILY FOR BLOOD SUGAR  90 Tab  0   . Multiple Vitamins-Minerals (MULTIVITAMIN & MINERAL OR) 1 tab a day       . ONETOUCH LANCETS Does not apply Misc Use to test blood sugar 1-3 times daily  100 each  5   . Oxycodone-Acetaminophen 5-325 MG Oral Tab Take 1-2 tablets by mouth every 6 hours as needed for pain. For Pain.  40 tablet  0   . Tadalafil 5 MG Oral Tab 1 po qday  30 tablet  5     No  Facility-Administered Medications for the 10/08/13 encounter (Office Visit) with Reggie Pile.       Allergies:    Allergies as of 10/08/2013 Loraine Leriche as Reviewed 10/08/2013   Allergen Reaction Noted   . Atenolol  12/19/2001         OBJECTIVE:  Physical Exam:  BP 136/90  Pulse 94  Wt 202 lb 11.2 oz (91.944 kg)  BMI 31.52 kg/m2  SpO2 97%  Constitutional: WDWN, Pleasant and appropriate affect and No acute distress. He is normally virilized  HEENT:  WNL, PERRLA, MMM, normal dentition, No thyromegaly, No cervical adenopathy.  Lymphatic:  No neck adenopathy, No groin adenopathy and No axilla adenopathy.  Resp:  Normal effort, no audible crackle, wheezed or rubs.  CV:  No peripheral swelling noted, regular pulses  Abdomen:  No abdominal masses or tenderness, No hepatospleenomegaly, No CVA tenderness and No hernias noted.  GU Exam:       Uncircumcised penis with no lesions noticed. Orthotopic meatus that is patent     Testes descended bilaterally:  Right testis 20mL in volume with normal turgidity; Left testis 20mL in volume with normal turgidity. Vasa are palpable.  No scrotal rash or lesions noticed, Epidimymis normal to palpation, No urethral discharge.  Grossly normal phallus  Extremities:  No clubbing or cyanosis  Neuro/Psych:  Alert and Oriented x3, affect appropriate.  Normal strength, sensation and gait. Positive pinch and cremasteric reflexes.      ASSESSMENT/PLAN:   Roger [name] is a 52 year old male with organic erectile dysfunction.      spent explaining penile rehab, difference and relationships between erection, orgasm, ejacuation, defined his orgasmic events as incontinence and discussed.  Pt will restart penile rehab with CIALIS 2.5mg  daily, Rx 5mg  dose &gave coupon for 30 free tabs & instr'd to cut in half, when that runs out will Rx SILDENAFIL 20mg  (at [facility] this is <$1 a tab)  with sig of 1/2 tab daily.   Pt was instructed in the use of intracorporal injection in the following manner:  Pt  was instructed how to draw up medication for intracorporal injection into an insulin syringe.  He was able to return demonstrate this satisfactorily.    Following this a brief description of penile anatomy was presented and used as a bridge to inform the pt on where the injection was to be placed.    Pt was then able to take the insulin syringe and satisfactorily inject himself with 0.3 ml of ALPROSTADIL 26mcg/ml.  This resulted in him gettng an erection of 5/10 rigidity after .  Pt had NOpain with the injection.      RX ALPROSTADIL 56mcg/ml  disp  0.85ml , given written instructions and given list of compounding pharmacies.  Pt will return in ONE MONTH for follow-up.      Thank you very much for referring [name] to our clinic at the [facility].

## 2013-10-10 ENCOUNTER — Other Ambulatory Visit (INDEPENDENT_AMBULATORY_CARE_PROVIDER_SITE_OTHER): Payer: Self-pay | Admitting: Internal Medicine

## 2013-10-10 MED ORDER — METFORMIN HCL 1000 MG OR TABS
1000.0000 mg | ORAL_TABLET | Freq: Every day | ORAL | Status: DC
Start: 2013-10-10 — End: 2013-12-04

## 2013-10-15 ENCOUNTER — Encounter

## 2013-10-15 ENCOUNTER — Encounter (INDEPENDENT_AMBULATORY_CARE_PROVIDER_SITE_OTHER): Payer: PPO | Admitting: Internal Medicine

## 2013-10-16 LAB — AMB POC SPIROMETRY

## 2013-10-16 NOTE — Progress Notes (Signed)
[address]  [place], [place] [address]  [phone]    Patient Name:  Brett Becker  Date of Birth:  09-Sep-1961    Office Visit 10/16/2013    CHIEF COMPLAINT:    Chief Complaint   Patient presents with   ??? Other     Hospital followup       HISTORY OF PRESENT ILLNESS:    This patient returns for hospital follow up after being admitted with multiple issues.  Primary diagnosis was DTs and seizures.   Chest x-ray suggested a left basilar pneumothorax.  However, CT scan showed this just to be severe bolus emphysema.  Additionally, there were some infiltrative changes for which he was discharged on Levaquin for.  His predominant problem was a gastric ulcer that accounted for some GI bleeding.  Unfortunately, the patient seems unaware of this diagnosis and does not think he has a follow up appointment with the gastroenterologist.  He has no respiratory symptoms at present.      Past Medical History   Diagnosis Date   ??? Delirium tremens September, 2014     Admitted with DTs and seizure activity.  Found to have GI bleeding secondary to gastric ullcers.   ??? Gastric ulcer September, 2014     Found at EGD to have major gastric ulcers.       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 Pr neurological procedure unlisted     ??? Hx back surgery       x 2   ??? Hx endoscopy  September, 2014     Major gastric ulcer       History     Social History   ??? Marital Status: SINGLE     Spouse Name: N/A     Number of Children: N/A   ??? Years of Education: N/A     Occupational History   ??? Disabled Not Employed     disabled  secondary to back injury     Social History Main Topics   ??? Smoking status: Current Every Day Smoker -- 1.00 packs/day for 40 years     Types: Cigarettes   ??? Smokeless tobacco: Never Used   ??? Alcohol Use: Yes   ??? Drug Use: 2.00 per week      Comment: marijuana    ??? Sexually Active: Not on file       Social History Narrative    Lives with roommate    Previously employed in tree cutting - currently on disability with history of back injury and surgery x 2       Family History   Problem Relation Age of Onset   ??? Heart Disease Mother      CHF         Allergies   Allergen Reactions   ??? Codeine Rash  Current Outpatient Prescriptions   Medication Sig   ??? albuterol (VENTOLIN HFA) 90 mcg/actuation inhaler Take 2 puffs by inhalation every six (6) hours as needed for Wheezing.     No current facility-administered medications for this visit.       REVIEW OF SYSTEMS:   CONSTITUTIONAL:    There is no history of fever, chills, night sweats, weight loss, weight gain, persistent fatigue, or lethargy/hypersomnolence.   CARDIAC:   No chest pain, pressure, discomfort, palpitations, orthopnea, murmurs, or edema.       PHYSICAL EXAM:    Visit Vitals   Item Reading   ??? BP 110/74   ??? Pulse 91   ??? Temp(Src) 98.4 ??F (36.9 ??C) (Oral)   ??? Resp 16   ??? Ht 5' 7.5" (1.715 m)   ??? Wt 124 lb (56.246 kg)   ??? BMI 19.12 kg/m2   ??? SpO2 95% on RA        GENERAL APPEARANCE:   The patient is normal weight and in no respiratory distress.   HEENT:   PERRL.  Conjunctivae unremarkable.   Nasal mucosa is without epistaxis, exudate, or polyps.  Gums and dentition are unremarkable.  There is no oropharyngeal narrowing.  TMs are clear.    LUNGS:   Normal respiratory effort with symmetrical lung expansion.   Breath sounds are somewhat distant but otherwise unremarkable.   HEART:   There is a regular rate and rhythm.  No murmur, rub, or gallop.  There is no edema in lower extremities.    NEURO:   The patient is alert and oriented to person, place, and time.   Memory appears intact and mood is normal.  No gross sensorimotor deficits are present.      DIAGNOSTIC TESTS:     CXR:    PA and lateral chest x-ray done here today and reviewed by me on the monitor shows the previously noted severe bullous changes bilaterally.  There are no acute findings.      ASSESSMENT:  (Medical Decision Making)       ICD-9-CM    1. Bullous emphysema- severe  This is a chronic problem that does not require intervention at present. 492.0      2. Gastric ulcer, acute  The patient was to have had a follow up appointment made with GI at the time of discharge.  Most likely this was done and the patient is unaware of it.  At any rate, we will insure that he is set up to see them for follow up of his gastric ulcer. 531.30 REFERRAL TO GASTROENTEROLOGY        PLAN:  He will return here on a when necessary basis only.       Orders Placed This Encounter   ??? [facility] POC SPIROMETRY - 94010   ??? REFERRAL TO GASTROENTEROLOGY       Sharmon Leyden, MD  Electronically signed

## 2013-10-19 ENCOUNTER — Encounter (INDEPENDENT_AMBULATORY_CARE_PROVIDER_SITE_OTHER): Payer: PPO | Admitting: Internal Medicine

## 2013-11-05 ENCOUNTER — Other Ambulatory Visit: Payer: Self-pay

## 2013-11-05 MED ORDER — ONETOUCH DELICA LANCETS FINE MISC
Status: DC
Start: 2013-11-05 — End: 2015-10-24

## 2013-11-05 MED ORDER — GLUCOSE BLOOD VI STRP
ORAL_STRIP | Status: DC
Start: 2013-11-05 — End: 2015-01-13

## 2013-11-19 ENCOUNTER — Encounter (INDEPENDENT_AMBULATORY_CARE_PROVIDER_SITE_OTHER): Payer: Self-pay | Admitting: Internal Medicine

## 2013-12-03 IMAGING — US ABDOMEN ULTRASOUND LIMITED
1 series · 14 of 25 positions shown · non-contrast
Comparison: none

REASON FOR EXAM: EPIGATRIC PAIN, N/V
COMMENTS:   Body Site: GB and Fossa, CBD, Head of Pancreas

[Series 1: abdomen ultrasound limited · 0.23mm/px · 14 of 36 slices shown]
[im 1/36]
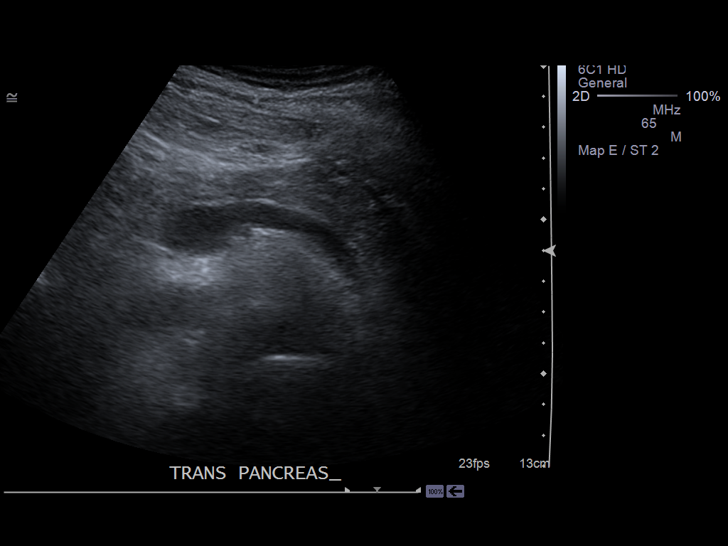
[im 3/36]
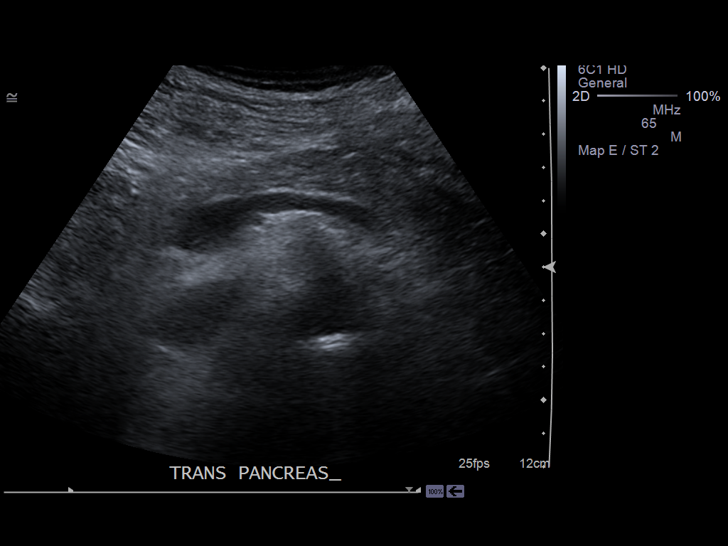
[im 6/36]
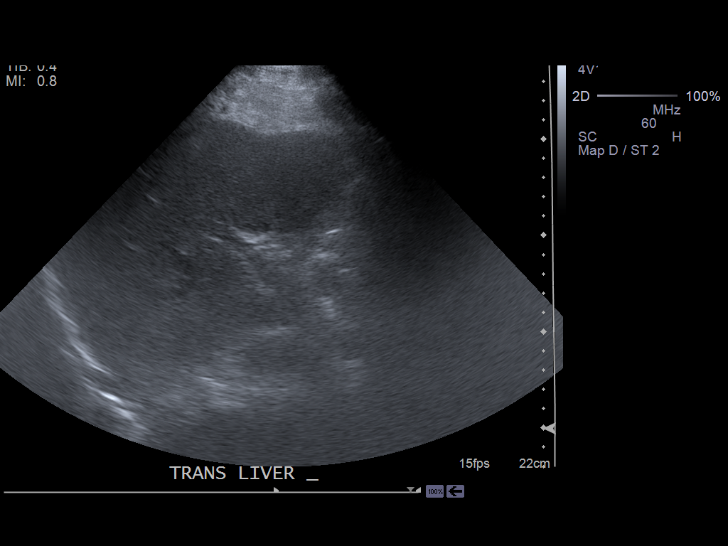
[im 9/36]
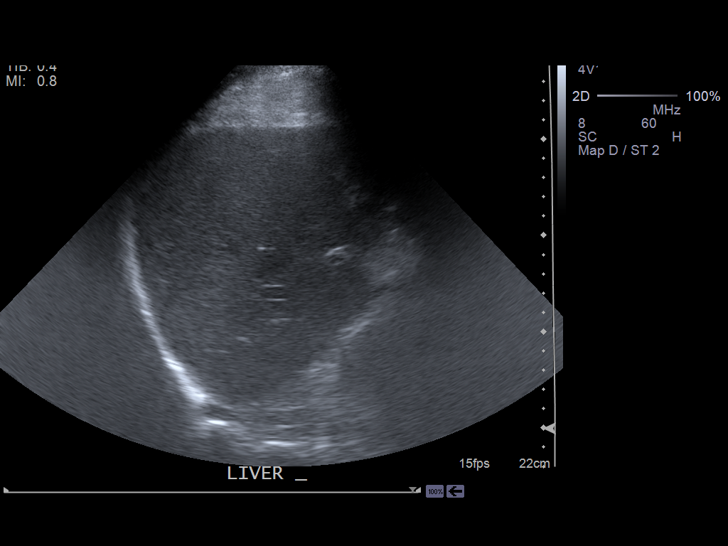
[im 12/36]
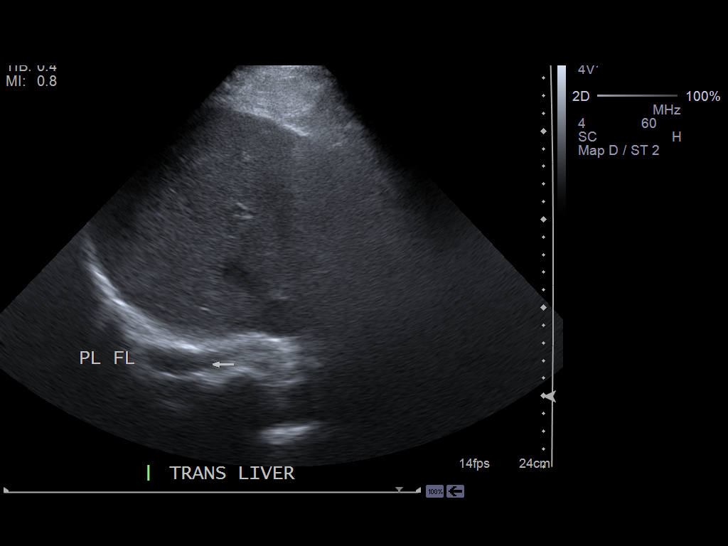
[im 14/36]
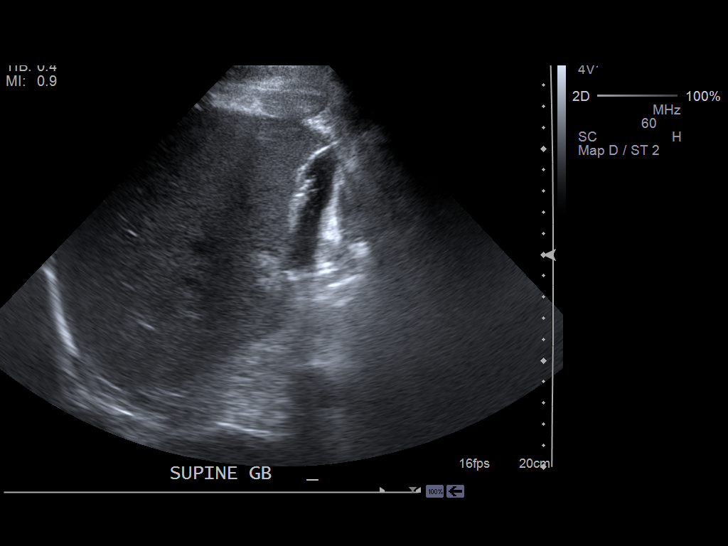
[im 17/36]
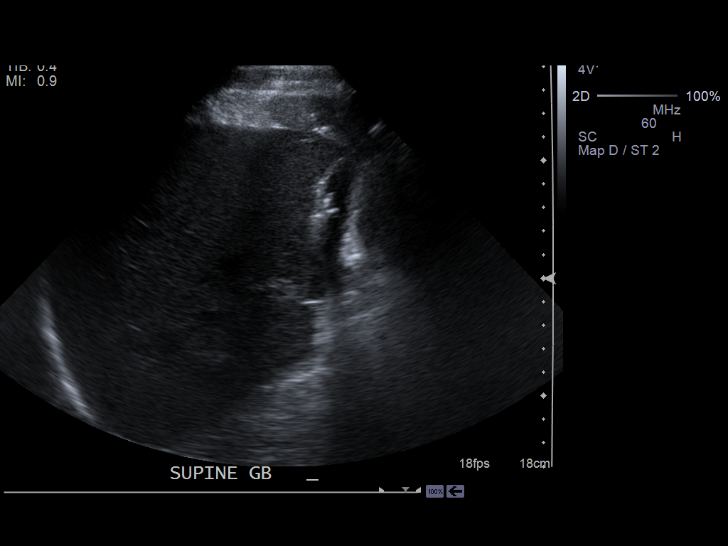
[im 19/36]
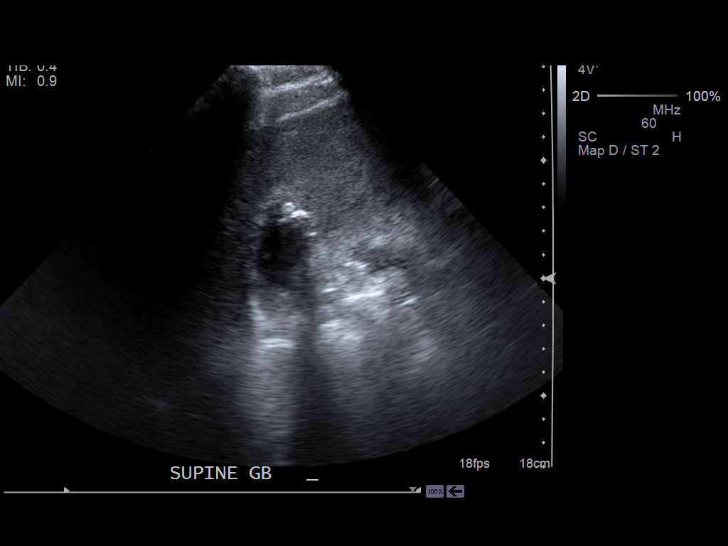
[im 22/36]
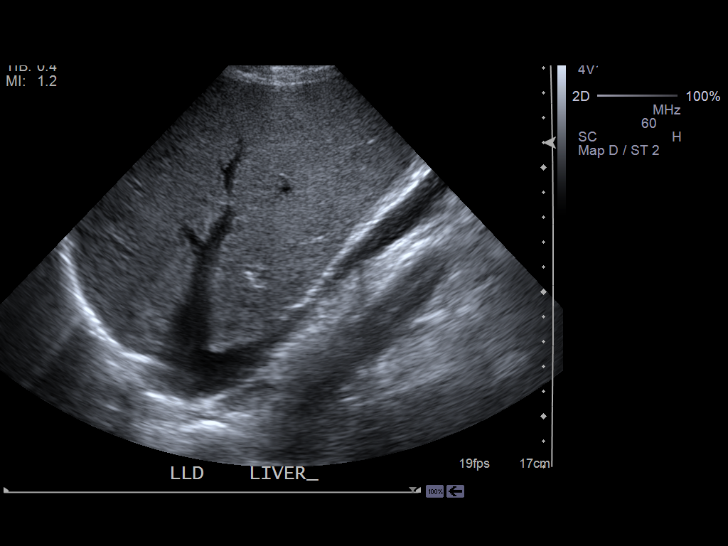
[im 24/36]
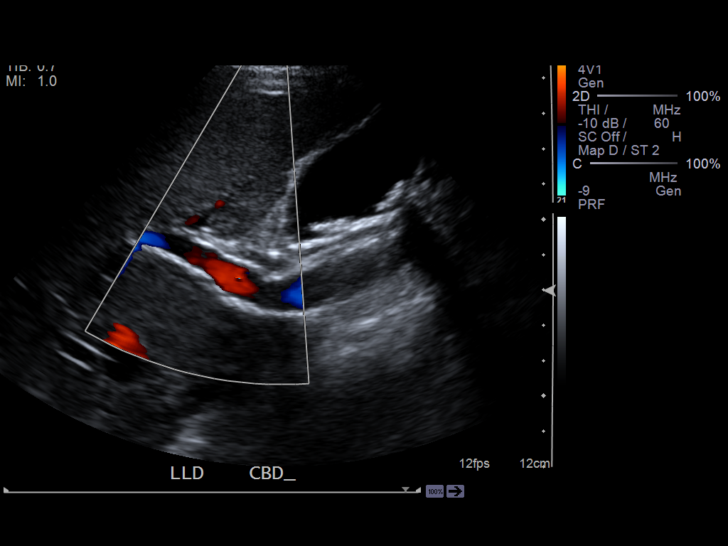
[im 27/36]
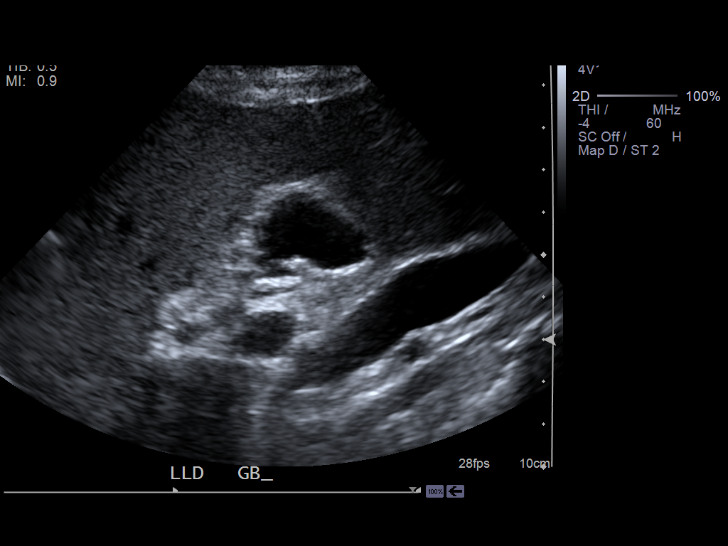
[im 30/36]
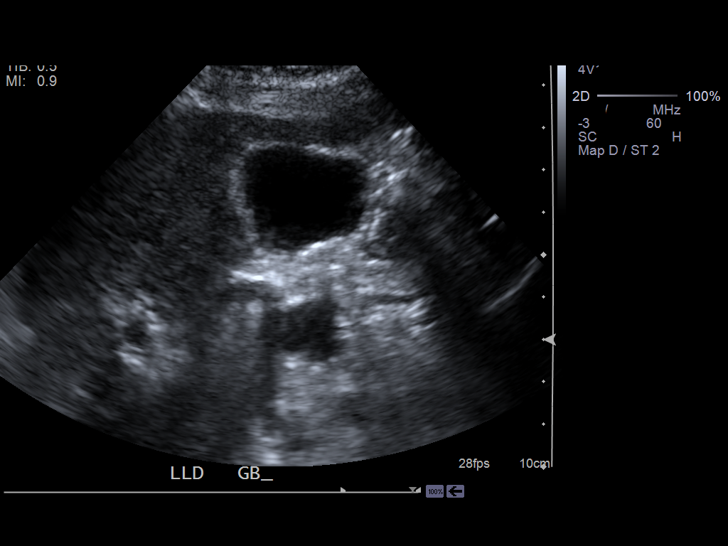
[im 33/36]
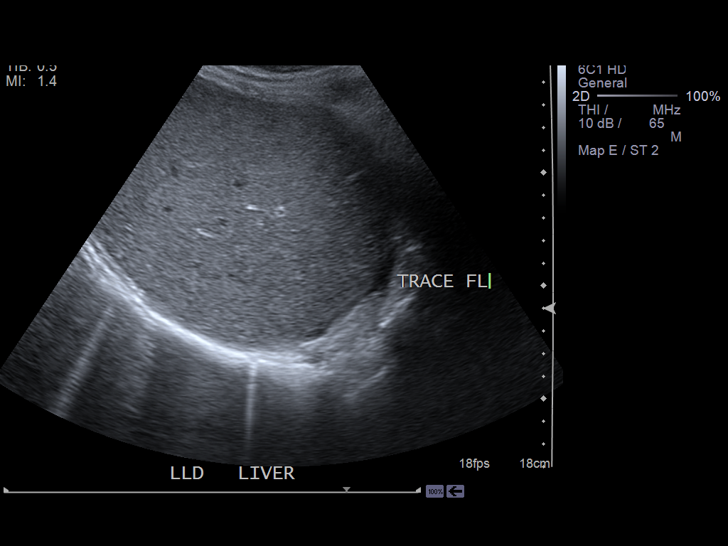
[im 36/36]
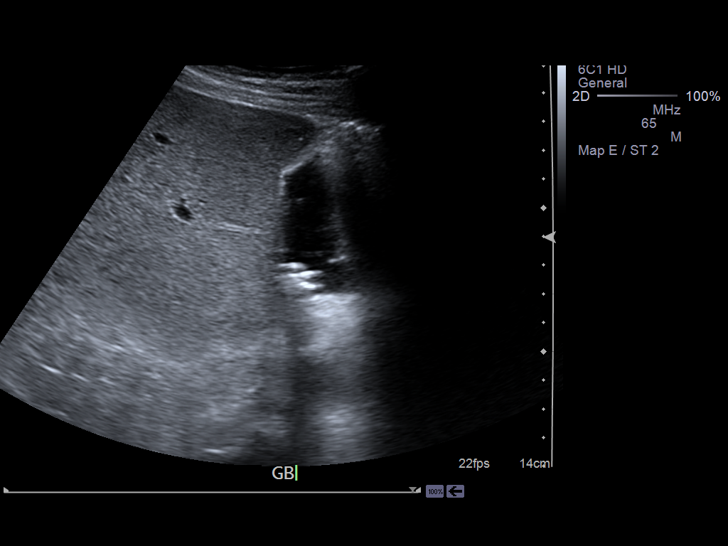

[14 of 25 positions shown; findings below may reference images not displayed]

PROCEDURE:     US  - US ABDOMEN LIMITED SURVEY  - May 13, 2013  [DATE]

RESULT:     Limited right upper quadrant abdominal sonogram is obtained. The
visualized pancreas appears normal. Portal venous flow is normal. Liver
length is 15.97 cm. There is no intrahepatic biliary ductal dilation.
Hepatic echotexture appears to be normal. Common bile duct diameter 3.7 mm.
The gallbladder wall thickness is 2.2 mm. There are small mobile stones
demonstrated within the gallbladder. There is trace amount of fluid adjacent
to the hepatic margin and subdiaphragmatic region with a trace right pleural
effusion suggested.
IMPRESSION: Cholelithiasis. There is a negative sonographic Murphy's
sign. There are no other signs to suggest acute cholecystitis. Trace
ascites. Trace right pleural effusion.

[REDACTED]

## 2013-12-04 ENCOUNTER — Ambulatory Visit (INDEPENDENT_AMBULATORY_CARE_PROVIDER_SITE_OTHER): Payer: PPO | Admitting: Internal Medicine

## 2013-12-04 ENCOUNTER — Encounter (INDEPENDENT_AMBULATORY_CARE_PROVIDER_SITE_OTHER): Payer: Self-pay | Admitting: Internal Medicine

## 2013-12-04 VITALS — BP 152/92 | HR 107 | Temp 98.4°F | Wt 203.6 lb

## 2013-12-04 LAB — PR A1C RAPID, ONSITE: Hemoglobin A1C: 5.4 % (ref 4.0–6.0)

## 2013-12-04 MED ORDER — LISINOPRIL 40 MG OR TABS
40.0000 mg | ORAL_TABLET | Freq: Every day | ORAL | Status: DC
Start: 2013-12-04 — End: 2014-11-16

## 2013-12-04 MED ORDER — METFORMIN HCL 500 MG OR TABS
500.0000 mg | ORAL_TABLET | Freq: Every day | ORAL | Status: DC
Start: 2013-12-04 — End: 2014-03-09

## 2013-12-04 MED ORDER — AMLODIPINE BESYLATE 10 MG OR TABS
10.0000 mg | ORAL_TABLET | Freq: Every day | ORAL | Status: DC
Start: 2013-12-04 — End: 2014-06-06

## 2013-12-04 MED ORDER — OXYCODONE-ACETAMINOPHEN 5-325 MG OR TABS
1.0000 | ORAL_TABLET | Freq: Four times a day (QID) | ORAL | Status: DC | PRN
Start: 2013-12-04 — End: 2014-05-01

## 2013-12-04 NOTE — Progress Notes (Signed)
CHIEF COMPLAINT:  Roger [name] is a 52 year old male here to discuss diabetes.    HISTORY OF PRESENT ILLNESS OR INTERVAL HISTORY:  Broke transverse process in August, then recently had a fall onto the back again.  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  He is taking ibuprofen for the back, which is not helping.  He does not feel he needs to be re-xrayed.  It is slowly healing.      He is taking his blood pressure medication.    He is drinking because of the holidays, but it is not daily.      He does not feel he needs to be re-xrayed.      Review of Systems   Constitutional: Negative.    Musculoskeletal: Positive for back pain.   Psychiatric/Behavioral: Negative.        PROBLEM LIST:  Reviewed and updated in the electronic medical record under Problem List.    This includes pertinent past medical and surgical history.    Patient Active Problem List    Diagnosis Date Noted   . Prostate cancer [185] 05/16/2013   . Anxiety [300.00] 04/29/2012   . 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 FAMILY HX GI MALIGNANCY [V16.0] 05/01/2002   . HYPERCALCEMIA [275.42] 06/11/2001   . ELEV TRANSAMINASE/LDH [790.4] 06/11/2001   . HYPERTENSION NOS [401.9] 06/02/2001   . Erectile dysfunction [607.84] 10/08/2013         MEDICATIONS:  Reviewed and confirmed in the electronic medical record     ALLERGIES:  Reviewed and confirmed in the electronic medical record     SOCIAL HISTORY:  Reviewed and confirmed in the electronic medical record           Physical Exam   Constitutional: He is oriented to person, place, and time and well-developed, well-nourished, and in no distress. No distress.   Cardiovascular: Normal rate, regular rhythm and normal heart sounds.  Exam reveals no gallop and no friction rub.    No murmur heard.  Pulmonary/Chest: Effort normal and breath sounds normal. No respiratory distress. He has no wheezes. He has no rales.      Neurological: He is alert and oriented to person, place, and time.   Skin: He is not diaphoretic.   Psychiatric: Mood, memory, affect and judgment normal.       ASSESSMENT AND PLAN:  (250.40,  583.81) Diabetes mellitus with nephropathy  (primary encounter diagnosis)  Comment: doing  well, Decreased metformin 500mg  daily to avoid side effects.     Last A1C 5.4 December  LDL 82 in december  ASA yes  Nephropathy:  Yes, on lisinopril.    Neuropathy:  negtive in June  Retinopathy:  needs  Blood Pressure <130/80:  no  Tobacco use:  no      (185) Prostate cancer  Comment: recovering with PSA not detected  Plan: CBC, DIFF       (401.9) Unspecified essential hypertension  Comment: increased lisinopril to 40mg   Plan: AmLODIPine Besylate 10 MG Oral Tab       (401.9) HYPERTENSION NOS  Plan: Lisinopril 40 MG Oral Tab     (724.5) Acute back pain  Plan: Oxycodone-Acetaminophen 5-325 MG Oral Tab given due to injury.  Previous imaging done, no fracture or tumor present.

## 2013-12-04 NOTE — Progress Notes (Signed)
Here today for diabetes follow up.

## 2013-12-05 LAB — COMPREHENSIVE METABOLIC PANEL
ALT (GPT): 42 U/L (ref 10–48)
AST (GOT): 69 U/L — ABNORMAL HIGH (ref 15–40)
Albumin: 4.5 g/dL (ref 3.5–5.2)
Alkaline Phosphatase (Total): 62 U/L (ref 39–139)
Anion Gap: 9 (ref 3–11)
Bilirubin (Total): 0.6 mg/dL (ref 0.2–1.3)
Calcium: 10.1 mg/dL (ref 8.9–10.2)
Carbon Dioxide, Total: 29 mEq/L (ref 22–32)
Chloride: 98 mEq/L (ref 98–108)
Creatinine: 0.71 mg/dL (ref 0.51–1.18)
GFR, Calc, African American: >60 mL/min (ref >59)
GFR, Calc, European American: >60 mL/min (ref >59)
Glucose: 96 mg/dL (ref 62–125)
Potassium: 3.9 mEq/L (ref 3.7–5.2)
Protein (Total): 7.5 g/dL (ref 6.0–8.2)
Sodium: 136 mEq/L (ref 136–145)
Urea Nitrogen: 10 mg/dL (ref 8–21)

## 2013-12-05 LAB — CBC, DIFF
% Basophils: 1 %
% Eosinophils: 1 %
% Immature Granulocytes: 0 %
% Lymphocytes: 29 %
% Monocytes: 10 %
% Neutrophils: 59 %
Absolute Eosinophil Count: 0.05 10*3/uL (ref 0.00–0.50)
Absolute Lymphocyte Count: 2.5 10*3/uL (ref 1.00–4.80)
Basophils: 0.04 10*3/uL (ref 0.00–0.20)
Hematocrit: 41 % (ref 38–50)
Hemoglobin: 13.8 g/dL (ref 13.0–18.0)
Immature Granulocytes: 0.03 10*3/uL (ref 0.00–0.05)
MCH: 29.7 pg (ref 27.3–33.6)
MCHC: 33.3 g/dL (ref 32.2–36.5)
MCV: 89 fL (ref 81–98)
Monocytes: 0.88 10*3/uL — ABNORMAL HIGH (ref 0.00–0.80)
Neutrophils: 5.04 10*3/uL (ref 1.80–7.00)
Platelet Count: 262 10*3/uL (ref 150–400)
RBC: 4.65 mil/uL (ref 4.40–5.60)
RDW-CV: 14.4 % (ref 11.6–14.4)
WBC: 8.54 10*3/uL (ref 4.3–10.0)

## 2013-12-05 LAB — LIPID PANEL
Cholesterol (LDL): 82 mg/dL (ref <130)
Cholesterol/HDL Ratio: 2.3
HDL Cholesterol: 88 mg/dL (ref >40)
Non-HDL Cholesterol: 111 mg/dL (ref 0–159)
Total Cholesterol: 199 mg/dL (ref <200)
Triglyceride: 143 mg/dL (ref <150)

## 2013-12-10 ENCOUNTER — Telehealth (HOSPITAL_BASED_OUTPATIENT_CLINIC_OR_DEPARTMENT_OTHER): Payer: Self-pay | Admitting: Registered Nurse

## 2013-12-10 ENCOUNTER — Encounter (HOSPITAL_BASED_OUTPATIENT_CLINIC_OR_DEPARTMENT_OTHER): Payer: PPO | Admitting: Registered Nurse

## 2013-12-10 NOTE — Telephone Encounter (Signed)
CONFIRMED PHONE NUMBER:[phone]  CALLERS FIRST AND LAST NAME: Clabe Seal  FACILITY NAME: n/a TITLE: n/a  CALLERS RELATIONSHIP:Self  RETURN CALL: Detailed message on voicemail only     SUBJECT: General Message   REASON FOR REQUEST: same day cancel      MESSAGE: 12/29 at 1pm with Reggie Pile, rescheduled to  1/26.  Pt declined earlier appointments, he needs a Monday.  Reason for cancellation---he got called into work.

## 2013-12-21 ENCOUNTER — Other Ambulatory Visit (HOSPITAL_BASED_OUTPATIENT_CLINIC_OR_DEPARTMENT_OTHER): Payer: Self-pay | Admitting: Urology

## 2013-12-21 DIAGNOSIS — C61 Malignant neoplasm of prostate: Secondary | ICD-10-CM

## 2013-12-24 ENCOUNTER — Ambulatory Visit: Payer: PPO | Attending: Urology | Admitting: Urology

## 2013-12-24 ENCOUNTER — Encounter (HOSPITAL_BASED_OUTPATIENT_CLINIC_OR_DEPARTMENT_OTHER): Payer: Self-pay | Admitting: Urology

## 2013-12-24 VITALS — BP 134/86 | HR 102 | Resp 16 | Ht 68.0 in | Wt 206.0 lb

## 2013-12-24 DIAGNOSIS — C61 Malignant neoplasm of prostate: Secondary | ICD-10-CM | POA: Insufficient documentation

## 2013-12-24 NOTE — Progress Notes (Signed)
Urology Follow-up     SUBJECTIVE:    Chief Complaint   Patient presents with   . Follow-Up      4 month     History of Present Illness:  Roger [name] is a 53 year old male who returns  to me for prostate cancer.  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   Margins negative.     Doing well.  No urine incontinence for months.  Penile rehab and able to get erections with adjuncts.    Review of Systems  Const: Neg for fever, chills, weight loss    Past Medical Hx:    Past Medical History   Diagnosis Date   . Unspecified essential hypertension 1996     on 3 drugs, some side effects from medications.    . Family history of colon cancer      brother died in 32s of colon cancer    . Type II or unspecified type diabetes mellitus without mention of complication, not stated as uncontrolled 2013   . History of erectile dysfunction        Past Surgical Hx:    Past Surgical History   Procedure Laterality Date   . Colonoscopy stoma dx including collj spec spx  2003     [facility], normal   . Colonoscopy stoma dx including collj spec spx  2011   . Prostate surgery         Past Family and Social Hx:  [name]  , family history includes Cancer in his brother and Hypertension in his mother., He,  reports that he has never smoked. He does not have any smokeless tobacco history on file. He reports that he drinks about 6.0 ounces of alcohol per week. He reports that he does not use illicit drugs..    Active Meds:    Outpatient Prescriptions Marked as Taking for the 12/24/13 encounter (Office Visit) with Edilia Bo   Medication Sig Dispense Refill   . Alprostadil, Vasodilator, 20 MCG IntraCAVernous Kit ALPROSTADIL 55mcrograms/ml DISPENSE 151mSIG 0.26m66m40units) INTRACORPORALLY before sexual activity.  Not to exceed once in 24 hours NOR three times in a week  PLEASE ALSO DISPENSE insulin syringes u-100 1cc size 29guage or larger diameter smallest length available.  10 kit  6   . Aspirin 81 MG Oral Tab  Take 1 tablet (81 mg) by mouth daily.  30 tablet     . Blood Glucose Monitoring Suppl (BLOOD GLUCOSE MONITOR KIT) Does not apply Kit one meter for testing 1-3 times per day  1 Kit  11   . Glucose Blood In Vitro Strip Use to check blood sugar up to 3 times daily  300 strip  3   . Multiple Vitamins-Minerals (MULTIVITAMIN & MINERAL OR) 1 tab a day       . ONETOUCH DELICA LANCETS FINE Does not apply Misc Use to check blood sugar up to 3 times daily  300 each  3   . Tadalafil 5 MG Oral Tab 1 po qday  30 tablet  5     No Facility-Administered Medications for the 12/24/13 encounter (Office Visit) with HarEdilia Bo     Allergies:    Allergies as of 12/24/2013 - MElta Guadeloupe Reviewed 12/24/2013   Allergen Reaction Noted   . Atenolol  12/19/2001       OBJECTIVE:  Physical Exam:  BP 134/86  Pulse 102  Resp 16  Ht _1  (1.727  m)  Wt 206 lb (93.441 kg)  BMI 31.33 kg/m2  Constitutional: WDWN, Pleasant and appropriate affect and No acute distress      IMPRESSION AND PLAN     1. 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  Prostatectomy May 2014.  PSA undetectable postop and pending today.    2. ED.  Penile rehab.  Able to get erections for intercourse.  3. Diabetes.  4. Hypertension.  5. Obesity.  BMI 31.    Plan PSA in 6 months

## 2013-12-25 ENCOUNTER — Ambulatory Visit
Admit: 2013-12-25 | Discharge: 2013-12-25 | Disposition: A | Discharge status: Home / Self Care | Payer: PPO | Attending: Urology | Admitting: Urology

## 2013-12-25 ENCOUNTER — Other Ambulatory Visit (INDEPENDENT_AMBULATORY_CARE_PROVIDER_SITE_OTHER): Payer: PPO

## 2013-12-25 DIAGNOSIS — C61 Malignant neoplasm of prostate: Secondary | ICD-10-CM | POA: Insufficient documentation

## 2013-12-25 LAB — PSA, DIAGNOSTIC/MONITORING: PSA, Diagnostic/Monitoring: 0.1 ng/mL (ref 0.00–4.00)

## 2013-12-28 ENCOUNTER — Telehealth (HOSPITAL_BASED_OUTPATIENT_CLINIC_OR_DEPARTMENT_OTHER): Payer: Self-pay | Admitting: Urology

## 2013-12-28 DIAGNOSIS — C61 Malignant neoplasm of prostate: Secondary | ICD-10-CM

## 2013-12-28 NOTE — Telephone Encounter (Signed)
CONFIRMED PHONE NUMBER: [phone]  CALLERS FIRST AND LAST NAME: Tilda Franco  FACILITY NAME: n/a TITLE: n/a  CALLERS RELATIONSHIP:Self  RETURN CALL: General message OK     SUBJECT: Results Request   REASON FOR REQUEST: test results request- patient had his blood work done at the lab at [facility], results were to go to Dr. Jodi Mourning patient advised.    WHO ORDERED THE TEST: Dr. Jodi Mourning  TYPE OF TEST? Blood  WHERE WAS THE TEST DONE: [facility]  WHEN WAS THE TEST DONE: 12/25/2013 - for PSA check  * please call the patient as soon as possible on 12/27/2013 to advise. Thank you

## 2013-12-28 NOTE — Telephone Encounter (Signed)
Forwarding to Dr. Jodi Mourning.    Lesle Chris RN

## 2014-01-02 NOTE — Telephone Encounter (Signed)
Calling again about results. Please contact to assist. Thanks!

## 2014-01-03 NOTE — Addendum Note (Signed)
Addended by: Arbie Cookey DAVID on: 01/03/2014 11:41 AM     Modules accepted: Orders

## 2014-01-03 NOTE — Telephone Encounter (Signed)
I spoke with Dr. Jodi Mourning who requested I call Roger [name] with his PSA results. Plan is to do another blood draw in 3 months.  Call Made and results given    Lesle Chris RN

## 2014-01-07 ENCOUNTER — Encounter (HOSPITAL_BASED_OUTPATIENT_CLINIC_OR_DEPARTMENT_OTHER): Payer: PPO | Admitting: Registered Nurse

## 2014-01-28 ENCOUNTER — Other Ambulatory Visit (INDEPENDENT_AMBULATORY_CARE_PROVIDER_SITE_OTHER): Payer: Self-pay | Admitting: Internal Medicine

## 2014-01-28 DIAGNOSIS — Z760 Encounter for issue of repeat prescription: Secondary | ICD-10-CM

## 2014-01-29 ENCOUNTER — Other Ambulatory Visit: Payer: Self-pay

## 2014-01-29 LAB — CBC WITH DIFFERENTIAL/PLATELET
BASOS ABS: 0.1 10*3/uL (ref 0.0–0.1)
Basophil %: 1 %
Eosinophil #: 0.2 10*3/uL (ref 0.0–0.7)
Eosinophil %: 3.3 %
HCT: 25.8 % — AB (ref 40.0–52.0)
HGB: 8.7 g/dL — AB (ref 13.0–18.0)
LYMPHS ABS: 0.6 10*3/uL — AB (ref 1.0–3.6)
Lymphocyte %: 9.3 %
MCH: 27.8 pg (ref 26.0–34.0)
MCHC: 33.8 g/dL (ref 32.0–36.0)
MCV: 82 fL (ref 80–100)
MONO ABS: 0.7 x10 3/mm (ref 0.2–1.0)
MONOS PCT: 11.8 %
Neutrophil #: 4.5 10*3/uL (ref 1.4–6.5)
Neutrophil %: 74.6 %
PLATELETS: 176 10*3/uL (ref 150–440)
RBC: 3.14 10*6/uL — ABNORMAL LOW (ref 4.40–5.90)
RDW: 17.1 % — ABNORMAL HIGH (ref 11.5–14.5)
WBC: 6.1 10*3/uL (ref 3.8–10.6)

## 2014-01-29 LAB — COMPREHENSIVE METABOLIC PANEL
ALK PHOS: 184 U/L — AB
ANION GAP: 8 (ref 7–16)
Albumin: 3.2 g/dL — ABNORMAL LOW (ref 3.4–5.0)
BUN: 86 mg/dL — ABNORMAL HIGH (ref 7–18)
Bilirubin,Total: 0.5 mg/dL (ref 0.2–1.0)
CHLORIDE: 100 mmol/L (ref 98–107)
Calcium, Total: 8.8 mg/dL (ref 8.5–10.1)
Co2: 28 mmol/L (ref 21–32)
Creatinine: 2.23 mg/dL — ABNORMAL HIGH (ref 0.60–1.30)
EGFR (African American): 38 — ABNORMAL LOW
EGFR (Non-African Amer.): 32 — ABNORMAL LOW
Glucose: 172 mg/dL — ABNORMAL HIGH (ref 65–99)
Osmolality: 302 (ref 275–301)
POTASSIUM: 4 mmol/L (ref 3.5–5.1)
SGOT(AST): 29 U/L (ref 15–37)
SGPT (ALT): 25 U/L (ref 12–78)
Sodium: 136 mmol/L (ref 136–145)
Total Protein: 6.4 g/dL (ref 6.4–8.2)

## 2014-01-29 LAB — PROTIME-INR
INR: 2.6
PROTHROMBIN TIME: 27.4 s — AB (ref 11.5–14.7)

## 2014-01-30 MED ORDER — AMLODIPINE BESYLATE 10 MG OR TABS
ORAL_TABLET | ORAL | Status: DC
Start: 2014-01-28 — End: 2014-05-01

## 2014-02-07 ENCOUNTER — Other Ambulatory Visit: Payer: Self-pay

## 2014-02-07 LAB — CBC WITH DIFFERENTIAL/PLATELET
Basophil #: 0.1 10*3/uL (ref 0.0–0.1)
Basophil %: 1.3 %
EOS ABS: 0.2 10*3/uL (ref 0.0–0.7)
EOS PCT: 3.1 %
HCT: 27.2 % — AB (ref 40.0–52.0)
HGB: 9.2 g/dL — AB (ref 13.0–18.0)
LYMPHS ABS: 0.5 10*3/uL — AB (ref 1.0–3.6)
Lymphocyte %: 7.8 %
MCH: 27.9 pg (ref 26.0–34.0)
MCHC: 33.7 g/dL (ref 32.0–36.0)
MCV: 83 fL (ref 80–100)
Monocyte #: 0.7 x10 3/mm (ref 0.2–1.0)
Monocyte %: 12.1 %
NEUTROS PCT: 75.7 %
Neutrophil #: 4.7 10*3/uL (ref 1.4–6.5)
Platelet: 184 10*3/uL (ref 150–440)
RBC: 3.29 10*6/uL — ABNORMAL LOW (ref 4.40–5.90)
RDW: 16.6 % — ABNORMAL HIGH (ref 11.5–14.5)
WBC: 6.2 10*3/uL (ref 3.8–10.6)

## 2014-02-07 LAB — COMPREHENSIVE METABOLIC PANEL
ALK PHOS: 158 U/L — AB
ALT: 27 U/L (ref 12–78)
Albumin: 3.4 g/dL (ref 3.4–5.0)
Anion Gap: 7 (ref 7–16)
BILIRUBIN TOTAL: 0.4 mg/dL (ref 0.2–1.0)
BUN: 85 mg/dL — ABNORMAL HIGH (ref 7–18)
CREATININE: 2.25 mg/dL — AB (ref 0.60–1.30)
Calcium, Total: 8.9 mg/dL (ref 8.5–10.1)
Chloride: 100 mmol/L (ref 98–107)
Co2: 28 mmol/L (ref 21–32)
EGFR (African American): 37 — ABNORMAL LOW
EGFR (Non-African Amer.): 32 — ABNORMAL LOW
GLUCOSE: 157 mg/dL — AB (ref 65–99)
Osmolality: 299 (ref 275–301)
POTASSIUM: 4.3 mmol/L (ref 3.5–5.1)
SGOT(AST): 21 U/L (ref 15–37)
SODIUM: 135 mmol/L — AB (ref 136–145)
TOTAL PROTEIN: 6.6 g/dL (ref 6.4–8.2)

## 2014-02-07 LAB — PROTIME-INR
INR: 2.5
Prothrombin Time: 26.4 secs — ABNORMAL HIGH (ref 11.5–14.7)

## 2014-02-11 ENCOUNTER — Encounter (HOSPITAL_BASED_OUTPATIENT_CLINIC_OR_DEPARTMENT_OTHER): Payer: PPO | Admitting: Registered Nurse

## 2014-03-04 ENCOUNTER — Encounter (HOSPITAL_BASED_OUTPATIENT_CLINIC_OR_DEPARTMENT_OTHER): Payer: Self-pay | Admitting: Registered Nurse

## 2014-03-04 ENCOUNTER — Ambulatory Visit: Payer: PPO | Attending: Registered Nurse | Admitting: Registered Nurse

## 2014-03-04 VITALS — BP 147/100 | HR 108 | Wt 200.3 lb

## 2014-03-04 DIAGNOSIS — C61 Malignant neoplasm of prostate: Secondary | ICD-10-CM | POA: Insufficient documentation

## 2014-03-04 DIAGNOSIS — R7401 Elevation of levels of liver transaminase levels: Secondary | ICD-10-CM | POA: Insufficient documentation

## 2014-03-04 DIAGNOSIS — N529 Male erectile dysfunction, unspecified: Secondary | ICD-10-CM | POA: Insufficient documentation

## 2014-03-04 DIAGNOSIS — R7402 Elevation of levels of lactic acid dehydrogenase (LDH): Secondary | ICD-10-CM | POA: Insufficient documentation

## 2014-03-04 MED ORDER — SILDENAFIL CITRATE 20 MG OR TABS
ORAL_TABLET | ORAL | Status: DC
Start: 2014-03-04 — End: 2015-10-16

## 2014-03-04 NOTE — Progress Notes (Signed)
Chief Complaint   Patient presents with   . 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  Is using ALPROSTADIL 54mg/ml 0.468mwith resultant erection 8-9/10 that lasts for about 2063m no pain or other problems from injection.  Pt would also like to try oral PDEI medication for this problem, has used CIALIS in past, 5mg50m only dose (on demand) that he has tried, states it didnt work well.  Interval medical hx significant for PSA of 0.1 on 12/25/13, otherwise unremarkable.      CURRENT MEDICATIONS-   Current Outpatient Prescriptions   Medication Sig Dispense Refill   . Alprostadil, Vasodilator, 20 MCG IntraCAVernous Kit ALPROSTADIL 20mi29mrams/ml DISPENSE 10ml 72m0.4ml (466mits) INTRACORPORALLY before sexual activity.  Not to exceed once in 24 hours NOR three times in a week  PLEASE ALSO DISPENSE insulin syringes u-100 1cc size 29guage or larger diameter smallest length available.  10 kit  6   . AmLODIPine Besylate 10 MG Oral Tab TAKE 1 TABLET BY MOUTH DAILY  30 tablet  1   . AmLODIPine Besylate 10 MG Oral Tab Take 1 tablet (10 mg) by mouth daily.  30 tablet  3   . Aspirin 81 MG Oral Tab Take 1 tablet (81 mg) by mouth daily.  30 tablet     . Blood Glucose Monitoring Suppl (BLOOD GLUCOSE MONITOR KIT) Does not apply Kit one meter for testing 1-3 times per day  1 Kit  11   . Glucose Blood In Vitro Strip Use to check blood sugar up to 3 times daily  300 strip  3   . Lisinopril 40 MG Oral Tab Take 1 tablet (40 mg) by mouth daily.  90 tablet  3   . MetFORMIN HCl 500 MG Oral Tab Take 1 tablet (500 mg) by mouth daily. For blood sugar  90 tablet  0   . Multiple Vitamins-Minerals (MULTIVITAMIN & MINERAL OR) 1 tab a day       . ONETOUCH DELICA LANCETS FINE Does not apply Misc Use to check blood sugar up to 3 times daily  300 each  3   . Oxycodone-Acetaminophen 5-325 MG Oral  Tab Take 1 tablet by mouth every 6 hours as needed for pain. For Pain.  40 tablet  0   . Sildenafil Citrate 20 MG Oral Tab TAKE FIVE 20mg ta47ms (100mg) ON30mUR prior to sexual activity.  50 tablet  5   . Tadalafil 5 MG Oral Tab 1 po qday  30 tablet  5     No current facility-administered medications for this visit.     SOCIAL- no change    OBJECTIVE  Adult male in no apparent distress.    VITAL SIGNS  BP 147/100   Pulse 108   Wt 200 lb 4.8 oz (90.855 kg)   SpO2 99%       ASSESSMENT  Encounter Diagnoses   Name Primary?   . Prostate cancer Yes   . ELEV TRANSAMINASE/LDH    . Erectile dysfunction        PLAN-   Rx SILDENAFIL 20mg try 43m tabs (100mg) one 51m prior to sexual activity  Continue on inejcts  Made sure future order for PSA repeat in April 2015 placed.   RTC 72mo or prn81mo

## 2014-03-09 ENCOUNTER — Other Ambulatory Visit (INDEPENDENT_AMBULATORY_CARE_PROVIDER_SITE_OTHER): Payer: Self-pay | Admitting: Internal Medicine

## 2014-03-09 DIAGNOSIS — Z760 Encounter for issue of repeat prescription: Secondary | ICD-10-CM

## 2014-03-11 MED ORDER — METFORMIN HCL 500 MG OR TABS
ORAL_TABLET | ORAL | Status: DC
Start: 2014-03-11 — End: 2014-05-01

## 2014-03-15 ENCOUNTER — Other Ambulatory Visit (HOSPITAL_BASED_OUTPATIENT_CLINIC_OR_DEPARTMENT_OTHER): Payer: Self-pay | Admitting: Registered Nurse

## 2014-03-15 DIAGNOSIS — N529 Male erectile dysfunction, unspecified: Secondary | ICD-10-CM

## 2014-03-15 MED ORDER — ALPROSTADIL (VASODILATOR) 20 MCG IC KIT
PACK | INTRACAVERNOUS | Status: DC
Start: 2014-03-15 — End: 2014-03-26

## 2014-03-15 NOTE — Telephone Encounter (Signed)
LV 03/04/14, rtc 6 months

## 2014-03-15 NOTE — Telephone Encounter (Signed)
Patient uses [facility] but states he is not sure if they carry this medication.

## 2014-03-16 ENCOUNTER — Other Ambulatory Visit: Payer: Self-pay | Admitting: Family Medicine

## 2014-03-16 LAB — METABOLIC PANEL, BASIC
Anion gap: 9 mmol/L (ref 7–16)
BUN: 7 MG/DL (ref 6–23)
CO2: 25 mmol/L (ref 21–32)
Calcium: 8.1 MG/DL — ABNORMAL LOW (ref 8.3–10.4)
Chloride: 90 mmol/L — ABNORMAL LOW (ref 98–107)
Creatinine: 0.98 MG/DL (ref 0.8–1.5)
GFR est AA: >60 mL/min/{1.73_m2} (ref >60)
GFR est non-AA: >60 mL/min/{1.73_m2} (ref >60)
Glucose: 120 mg/dL — ABNORMAL HIGH (ref 65–100)
Potassium: 3.7 mmol/L (ref 3.5–5.1)
Sodium: 124 mmol/L — CL (ref 136–145)

## 2014-03-16 LAB — CBC WITH AUTOMATED DIFF
ABS. BASOPHILS: 0 10*3/uL (ref 0.0–0.2)
ABS. EOSINOPHILS: 0.2 10*3/uL (ref 0.0–0.8)
ABS. IMM. GRANS.: 0 10*3/uL (ref 0.0–0.5)
ABS. LYMPHOCYTES: 1.6 10*3/uL (ref 0.5–4.6)
ABS. MONOCYTES: 0.6 10*3/uL (ref 0.1–1.3)
ABS. NEUTROPHILS: 4.2 10*3/uL (ref 1.7–8.2)
BASOPHILS: 0 % (ref 0.0–2.0)
EOSINOPHILS: 3 % (ref 0.5–7.8)
HCT: 39.1 % — ABNORMAL LOW (ref 41.1–50.3)
HGB: 14.7 g/dL (ref 13.6–17.2)
IMMATURE GRANULOCYTES: 0.3 % (ref 0.0–5.0)
LYMPHOCYTES: 24 % (ref 13–44)
MCH: 33.8 PG — ABNORMAL HIGH (ref 26.1–32.9)
MCHC: 37.6 g/dL — ABNORMAL HIGH (ref 31.4–35.0)
MCV: 89.9 FL (ref 79.6–97.8)
MONOCYTES: 9 % (ref 4.0–12.0)
MPV: 9.1 FL — ABNORMAL LOW (ref 10.8–14.1)
NEUTROPHILS: 64 % (ref 43–78)
PLATELET: 257 10*3/uL (ref 150–450)
RBC: 4.35 M/uL (ref 4.23–5.67)
RDW: 13.5 % (ref 11.9–14.6)
WBC: 6.7 10*3/uL (ref 4.3–11.1)

## 2014-03-16 LAB — CBC WITH DIFFERENTIAL/PLATELET
Basophil #: 0.1 10*3/uL (ref 0.0–0.1)
Basophil %: 1 %
EOS PCT: 3.5 %
Eosinophil #: 0.2 10*3/uL (ref 0.0–0.7)
HCT: 21.3 % — ABNORMAL LOW (ref 40.0–52.0)
HGB: 7 g/dL — ABNORMAL LOW (ref 13.0–18.0)
LYMPHS ABS: 0.4 10*3/uL — AB (ref 1.0–3.6)
Lymphocyte %: 5.8 %
MCH: 25.8 pg — ABNORMAL LOW (ref 26.0–34.0)
MCHC: 32.6 g/dL (ref 32.0–36.0)
MCV: 79 fL — ABNORMAL LOW (ref 80–100)
MONO ABS: 0.7 x10 3/mm (ref 0.2–1.0)
MONOS PCT: 10.4 %
Neutrophil #: 5.4 10*3/uL (ref 1.4–6.5)
Neutrophil %: 79.3 %
PLATELETS: 192 10*3/uL (ref 150–440)
RBC: 2.69 10*6/uL — ABNORMAL LOW (ref 4.40–5.90)
RDW: 17.4 % — AB (ref 11.5–14.5)
WBC: 6.8 10*3/uL (ref 3.8–10.6)

## 2014-03-16 LAB — PROTIME-INR
INR: 2.1
Prothrombin Time: 22.9 secs — ABNORMAL HIGH (ref 11.5–14.7)

## 2014-03-16 MED ORDER — CLINDAMYCIN 150 MG CAP
150 mg | ORAL_CAPSULE | Freq: Three times a day (TID) | ORAL | Status: AC
Start: 2014-03-16 — End: 2014-03-23

## 2014-03-16 MED ORDER — HYDROCODONE-ACETAMINOPHEN 5 MG-325 MG TAB
5-325 mg | ORAL_TABLET | ORAL | Status: DC | PRN
Start: 2014-03-16 — End: 2015-02-24

## 2014-03-16 MED ORDER — CLINDAMYCIN 600 MG/4 ML IV
6004 mg/4 mL | INTRAVENOUS | Status: AC
Start: 2014-03-16 — End: 2014-03-16
  Administered 2014-03-16: 23:00:00 via INTRAVENOUS

## 2014-03-16 MED ORDER — SODIUM CHLORIDE 0.9 % IJ SYRG
INTRAMUSCULAR | Status: DC | PRN
Start: 2014-03-16 — End: 2014-03-16

## 2014-03-16 MED ORDER — SODIUM CHLORIDE 0.9% BOLUS IV
0.9 % | Freq: Once | INTRAVENOUS | Status: AC
Start: 2014-03-16 — End: 2014-03-16
  Administered 2014-03-16: 23:00:00 via INTRAVENOUS

## 2014-03-16 MED ORDER — SODIUM CHLORIDE 0.9 % IJ SYRG
Freq: Three times a day (TID) | INTRAMUSCULAR | Status: DC
Start: 2014-03-16 — End: 2014-03-16

## 2014-03-16 MED ORDER — OXYCODONE-ACETAMINOPHEN 5 MG-325 MG TAB
5-325 mg | ORAL | Status: AC
Start: 2014-03-16 — End: 2014-03-16
  Administered 2014-03-16: 21:00:00 via ORAL

## 2014-03-16 MED FILL — CLINDAMYCIN 600 MG/4 ML IV: 600 mg/4 mL | INTRAVENOUS | Qty: 4

## 2014-03-16 MED FILL — OXYCODONE-ACETAMINOPHEN 5 MG-325 MG TAB: 5-325 mg | ORAL | Qty: 1

## 2014-03-16 NOTE — ED Notes (Signed)
Blood cultures not needed prior to antibiotic administration per Dr. Lou Minerhorn.

## 2014-03-16 NOTE — ED Notes (Signed)
C/o left foot pain, swelling and redness, redness started approx 2 days ago, fall approx 1 week ago, no history of diabetes, pt reports "i just tried to shake it off". Denies chest pain, denies n/v/d, denies sob. Denies any known fever

## 2014-03-16 NOTE — ED Notes (Signed)
C/o left foot pain, redness, and swelling. C/o falling off back porch about one week ago.

## 2014-03-16 NOTE — ED Provider Notes (Signed)
Patient is a 53 y.o. male presenting with foot pain. The history is provided by the patient.   Foot Pain   This is a new problem. Episode onset: tripped on foot 1 week ago.  No the left foot has become more painful and swollen  The problem occurs constantly. The problem has not changed since onset.The pain is present in the left foot. The quality of the pain is described as aching. The pain is moderate. Pertinent negatives include no numbness, full range of motion, no stiffness, no tingling and no back pain. He has tried nothing for the symptoms. There has been a history of trauma.        Past Medical History   Diagnosis Date   ??? Delirium tremens September, 2014     Admitted with DTs and seizure activity.  Found to have GI bleeding secondary to gastric ullcers.   ??? Gastric ulcer September, 2014     Found at EGD to have major gastric ulcers.        Past Surgical History   Procedure Laterality Date   ??? Pr neurological procedure unlisted     ??? Hx back surgery       x 2   ??? Hx endoscopy  September, 2014     Major gastric ulcer         Family History   Problem Relation Age of Onset   ??? Heart Disease Mother      CHF        History     Social History   ??? Marital Status: SINGLE     Spouse Name: N/A     Number of Children: N/A   ??? Years of Education: N/A     Occupational History   ??? Disabled Not Employed     disabled secondary to back injury     Social History Main Topics   ??? Smoking status: Current Every Day Smoker -- 1.00 packs/day for 40 years     Types: Cigarettes   ??? Smokeless tobacco: Never Used   ??? Alcohol Use: Yes   ??? Drug Use: 2.00 per week      Comment: marijuana    ??? Sexual Activity: Not on file     Other Topics Concern   ??? Not on file     Social History Narrative    Lives with roommate    Previously employed in tree cutting - currently on disability with history of back injury and surgery x 2                              ALLERGIES: Codeine      Review of Systems   Constitutional: Negative for fever and chills.    Musculoskeletal: Positive for joint swelling (left foot) and arthralgias (left foot and ankle). Negative for back pain and stiffness.   Skin: Positive for color change. Negative for pallor and wound.   Neurological: Negative for tingling, weakness and numbness.   All other systems reviewed and are negative.      Filed Vitals:    03/16/14 1600 03/16/14 1638   BP: 142/82    Pulse: 89    Temp: 98.4 ??F (36.9 ??C)    Resp: 16    Height: 5\' 8"  (1.727 m)    Weight: 56.7 kg (125 lb)    SpO2: 96% 96%            Physical Exam  Constitutional: He is oriented to person, place, and time. He appears well-developed and well-nourished. No distress.   HENT:   Head: Normocephalic and atraumatic.   Eyes: Conjunctivae are normal. No scleral icterus.   Neck: Normal range of motion. Neck supple.   Cardiovascular: Intact distal pulses.    Pulmonary/Chest: Effort normal. No stridor. No respiratory distress.   Musculoskeletal:   Left foot swelling and pain with palpation.   Neurological: He is alert and oriented to person, place, and time.   No focal weakness   Skin: Skin is warm and dry. No bruising, no ecchymosis, no laceration, no petechiae and no rash noted. He is not diaphoretic. There is erythema (left foot).        Psychiatric: He has a normal mood and affect. His behavior is normal.   Nursing note and vitals reviewed.       MDM  Number of Diagnoses or Management Options  Diagnosis management comments: Pt has no systemic signs I am treating with clinda and gave strict return precautions.         Amount and/or Complexity of Data Reviewed  Clinical lab tests: ordered and reviewed (Results for orders placed during the hospital encounter of 03/16/14    -CBC WITH AUTOMATED DIFF     WBC K/uL                      6.7     Ref Range: 4.3 - 11.1 K/uL     RBC M/uL                      4.35    Ref Range: 4.23 - 5.67 M/uL     HGB g/dL                      16.114.7    Ref Range: 13.6 - 17.2 g/dL     HCT %                         39.1 (*)Ref Range:  41.1 - 50.3 %     MCV FL                        89.9    Ref Range: 79.6 - 97.8 FL     MCH PG                        33.8 (*)Ref Range: 26.1 - 32.9 PG     MCHC g/dL                     09.637.6 (*)Ref Range: 31.4 - 35.0 g/dL     RDW %                         13.5    Ref Range: 11.9 - 14.6 %     PLATELET K/uL                 257     Ref Range: 150 - 450 K/uL     MPV FL                        9.1 (*) Ref Range: 10.8 - 14.1 FL     DF  AUTOMATED                NEUTROPHILS %                 64      Ref Range: 43 - 78 %     LYMPHOCYTES %                 24      Ref Range: 13 - 44 %     MONOCYTES %                   9       Ref Range: 4.0 - 12.0 %     EOSINOPHILS %                 3       Ref Range: 0.5 - 7.8 %     BASOPHILS %                   0       Ref Range: 0.0 - 2.0 %     IMMATURE GRANULOCYTES %       0.3     Ref Range: 0.0 - 5.0 %     ABS. NEUTROPHILS K/UL         4.2     Ref Range: 1.7 - 8.2 K/UL     ABS. LYMPHOCYTES K/UL         1.6     Ref Range: 0.5 - 4.6 K/UL     ABS. MONOCYTES K/UL           0.6     Ref Range: 0.1 - 1.3 K/UL     ABS. EOSINOPHILS K/UL         0.2     Ref Range: 0.0 - 0.8 K/UL     ABS. BASOPHILS K/UL           0.0     Ref Range: 0.0 - 0.2 K/UL     ABS. IMM. GRANS. K/UL         0.0     Ref Range: 0.0 - 0.5 K/UL    -METABOLIC PANEL, BASIC     Sodium mmol/L                 124 (*) Ref Range: 136 - 145 mmol/L     Potassium mmol/L              3.7     Ref Range: 3.5 - 5.1 mmol/L     Chloride mmol/L               90 (*)  Ref Range: 98 - 107 mmol/L     CO2 mmol/L                    25      Ref Range: 21 - 32 mmol/L     Anion gap mmol/L              9       Ref Range: 7 - 16 mmol/L     Glucose mg/dL                 161 (*) Ref Range: 65 - 100 mg/dL     BUN MG/DL                     7  Ref Range: 6 - 23 MG/DL     Creatinine MG/DL              1.91    Ref Range: 0.8 - 1.5 MG/DL     GFR est AA YN/WGN/5.62Z3      >60     Ref Range: >60 ml/min/1.11m2     GFR est non-AA  ml/min/1.6m2  >60     Ref Range: >60 ml/min/1.20m2     Calcium MG/DL                 8.1 (*) Ref Range: 8.3 - 10.4 MG/DL )  Tests in the radiology section of CPT??: ordered and reviewed (Xr Ankle Lt Min 3 V    03/16/2014   Left ankle Left foot  Clinical indication: Pain, injury one week ago, swelling  Comparison: None  Technique: AP, oblique, lateral views left ankle and left foot  Findings:  Left ankle: There are tiny calcific densities inseparable from the distal aspect of the lateral malleolus. This may be due to chronic degenerative change or remote healed injury. If there is focal acute pain the possibility of nondisplaced avulsion fracture would be considered.  Soft tissue swelling is noted. No evidence of acute displaced fracture or dislocation.  Left foot: No evidence of fracture or dislocation.      03/16/2014   Impression: 1. Lateral malleolus finding as above. 2. No displaced fracture or dislocation.     Xr Foot Lt Min 3 V    03/16/2014   Left ankle Left foot  Clinical indication: Pain, injury one week ago, swelling  Comparison: None  Technique: AP, oblique, lateral views left ankle and left foot  Findings:  Left ankle: There are tiny calcific densities inseparable from the distal aspect of the lateral malleolus. This may be due to chronic degenerative change or remote healed injury. If there is focal acute pain the possibility of nondisplaced avulsion fracture would be considered.  Soft tissue swelling is noted. No evidence of acute displaced fracture or dislocation.  Left foot: No evidence of fracture or dislocation.      03/16/2014   Impression: 1. Lateral malleolus finding as above. 2. No displaced fracture or dislocation.    )        Procedures

## 2014-03-16 NOTE — ED Notes (Signed)
Med given as ordered

## 2014-03-16 NOTE — ED Notes (Signed)
The discharge instructions, follow-up information, medications, and prescriptions were reviewed with the patient. The patient verbalized understanding and all questions were answered. A copy of the discharge instructions were given to patient upon discharge. The patient was able to transport with no distress via ambulation. The patient was reminded to not drive while under the influence of medications. MD notified of abnormal/normal vital signs. No orders received at this time. All LDAs removed prior to patient leaving the facility.    Patient signed paper discharge instructions. Signed copy placed on chart.

## 2014-03-22 ENCOUNTER — Telehealth (HOSPITAL_BASED_OUTPATIENT_CLINIC_OR_DEPARTMENT_OTHER): Payer: Self-pay | Admitting: Registered Nurse

## 2014-03-22 NOTE — Telephone Encounter (Signed)
Roger [name] called to request a refill on his injectable medication.  Please call.  Thank you

## 2014-03-22 NOTE — Telephone Encounter (Signed)
Spoke with patient; explained he should call the pharmacy for his refill; gave him [place] [id number] [place] [phone]   Pt will call back if he cannot get his refills; pt stated good understanding.

## 2014-03-23 ENCOUNTER — Inpatient Hospital Stay: Payer: Self-pay | Admitting: Internal Medicine

## 2014-03-23 ENCOUNTER — Ambulatory Visit
Admit: 2014-03-23 | Discharge: 2014-03-23 | Disposition: A | Discharge status: Home / Self Care | Payer: PPO | Attending: Registered Nurse | Admitting: Registered Nurse

## 2014-03-23 DIAGNOSIS — C61 Malignant neoplasm of prostate: Secondary | ICD-10-CM | POA: Insufficient documentation

## 2014-03-23 LAB — COMPREHENSIVE METABOLIC PANEL
ALBUMIN: 3.3 g/dL — AB (ref 3.4–5.0)
ALT: 23 U/L (ref 12–78)
AST: 17 U/L (ref 15–37)
Alkaline Phosphatase: 269 U/L — ABNORMAL HIGH
Anion Gap: 7 (ref 7–16)
BUN: 80 mg/dL — AB (ref 7–18)
Bilirubin,Total: 0.9 mg/dL (ref 0.2–1.0)
CALCIUM: 9.2 mg/dL (ref 8.5–10.1)
Chloride: 101 mmol/L (ref 98–107)
Co2: 29 mmol/L (ref 21–32)
Creatinine: 2.23 mg/dL — ABNORMAL HIGH (ref 0.60–1.30)
EGFR (African American): 38 — ABNORMAL LOW
GFR CALC NON AF AMER: 32 — AB
Glucose: 193 mg/dL — ABNORMAL HIGH (ref 65–99)
Osmolality: 303 (ref 275–301)
Potassium: 3.3 mmol/L — ABNORMAL LOW (ref 3.5–5.1)
SODIUM: 137 mmol/L (ref 136–145)
Total Protein: 7.4 g/dL (ref 6.4–8.2)

## 2014-03-23 LAB — CBC WITH DIFFERENTIAL/PLATELET
BASOS PCT: 0.5 %
Basophil #: 0 10*3/uL (ref 0.0–0.1)
Eosinophil #: 0.1 10*3/uL (ref 0.0–0.7)
Eosinophil %: 1.3 %
HCT: 25.4 % — ABNORMAL LOW (ref 40.0–52.0)
HGB: 8.1 g/dL — ABNORMAL LOW (ref 13.0–18.0)
LYMPHS ABS: 0.2 10*3/uL — AB (ref 1.0–3.6)
Lymphocyte %: 2 %
MCH: 25 pg — ABNORMAL LOW (ref 26.0–34.0)
MCHC: 32.1 g/dL (ref 32.0–36.0)
MCV: 78 fL — ABNORMAL LOW (ref 80–100)
Monocyte #: 0.4 x10 3/mm (ref 0.2–1.0)
Monocyte %: 4.5 %
NEUTROS ABS: 7.6 10*3/uL — AB (ref 1.4–6.5)
Neutrophil %: 91.7 %
Platelet: 239 10*3/uL (ref 150–440)
RBC: 3.26 10*6/uL — ABNORMAL LOW (ref 4.40–5.90)
RDW: 17.9 % — AB (ref 11.5–14.5)
WBC: 8.3 10*3/uL (ref 3.8–10.6)

## 2014-03-23 LAB — URINALYSIS, COMPLETE
BACTERIA: NONE SEEN
BILIRUBIN, UR: NEGATIVE
Blood: NEGATIVE
GLUCOSE, UR: NEGATIVE mg/dL (ref 0–75)
Ketone: NEGATIVE
Leukocyte Esterase: NEGATIVE
Nitrite: NEGATIVE
Ph: 5 (ref 4.5–8.0)
Protein: 30
RBC,UR: 7 /HPF (ref 0–5)
Specific Gravity: 1.009 (ref 1.003–1.030)
Squamous Epithelial: NONE SEEN

## 2014-03-24 LAB — CBC WITH DIFFERENTIAL/PLATELET
BASOS ABS: 0 10*3/uL (ref 0.0–0.1)
BASOS PCT: 0.4 %
EOS ABS: 0.1 10*3/uL (ref 0.0–0.7)
Eosinophil %: 1.4 %
HCT: 22.7 % — ABNORMAL LOW (ref 40.0–52.0)
HGB: 6.8 g/dL — ABNORMAL LOW (ref 13.0–18.0)
LYMPHS ABS: 0.3 10*3/uL — AB (ref 1.0–3.6)
Lymphocyte %: 4.5 %
MCH: 23.3 pg — ABNORMAL LOW (ref 26.0–34.0)
MCHC: 29.7 g/dL — AB (ref 32.0–36.0)
MCV: 78 fL — ABNORMAL LOW (ref 80–100)
Monocyte #: 0.5 x10 3/mm (ref 0.2–1.0)
Monocyte %: 8.3 %
Neutrophil #: 5.2 10*3/uL (ref 1.4–6.5)
Neutrophil %: 85.4 %
Platelet: 184 10*3/uL (ref 150–440)
RBC: 2.9 10*6/uL — ABNORMAL LOW (ref 4.40–5.90)
RDW: 17.9 % — ABNORMAL HIGH (ref 11.5–14.5)
WBC: 6.1 10*3/uL (ref 3.8–10.6)

## 2014-03-24 LAB — OCCULT BLOOD X 1 CARD TO LAB, STOOL: Occult Blood, Feces: NEGATIVE

## 2014-03-24 LAB — HEMOGLOBIN: HGB: 8 g/dL — AB (ref 13.0–18.0)

## 2014-03-24 LAB — SEDIMENTATION RATE: Erythrocyte Sed Rate: 40 mm/hr — ABNORMAL HIGH (ref 0–20)

## 2014-03-24 LAB — PROTIME-INR
INR: 3.2
PROTHROMBIN TIME: 31.6 s — AB (ref 11.5–14.7)

## 2014-03-25 ENCOUNTER — Ambulatory Visit: Payer: Self-pay | Admitting: Orthopedic Surgery

## 2014-03-25 LAB — BASIC METABOLIC PANEL
ANION GAP: 6 — AB (ref 7–16)
BUN: 60 mg/dL — AB (ref 7–18)
CO2: 29 mmol/L (ref 21–32)
Calcium, Total: 8.5 mg/dL (ref 8.5–10.1)
Chloride: 106 mmol/L (ref 98–107)
Creatinine: 1.73 mg/dL — ABNORMAL HIGH (ref 0.60–1.30)
EGFR (Non-African Amer.): 44 — ABNORMAL LOW
GFR CALC AF AMER: 51 — AB
Glucose: 104 mg/dL — ABNORMAL HIGH (ref 65–99)
Osmolality: 298 (ref 275–301)
Potassium: 3 mmol/L — ABNORMAL LOW (ref 3.5–5.1)
Sodium: 141 mmol/L (ref 136–145)

## 2014-03-25 LAB — CBC WITH DIFFERENTIAL/PLATELET
BASOS PCT: 0.7 %
Basophil #: 0 10*3/uL (ref 0.0–0.1)
Eosinophil #: 0.2 10*3/uL (ref 0.0–0.7)
Eosinophil %: 3.8 %
HCT: 25.3 % — ABNORMAL LOW (ref 40.0–52.0)
HGB: 8 g/dL — ABNORMAL LOW (ref 13.0–18.0)
LYMPHS ABS: 0.5 10*3/uL — AB (ref 1.0–3.6)
Lymphocyte %: 9.2 %
MCH: 25 pg — ABNORMAL LOW (ref 26.0–34.0)
MCHC: 31.6 g/dL — AB (ref 32.0–36.0)
MCV: 79 fL — ABNORMAL LOW (ref 80–100)
MONOS PCT: 12.6 %
Monocyte #: 0.7 x10 3/mm (ref 0.2–1.0)
Neutrophil #: 3.8 10*3/uL (ref 1.4–6.5)
Neutrophil %: 73.7 %
Platelet: 165 10*3/uL (ref 150–440)
RBC: 3.2 10*6/uL — AB (ref 4.40–5.90)
RDW: 17.6 % — AB (ref 11.5–14.5)
WBC: 5.2 10*3/uL (ref 3.8–10.6)

## 2014-03-25 LAB — PROTIME-INR
INR: 2.3
PROTHROMBIN TIME: 25 s — AB (ref 11.5–14.7)

## 2014-03-25 LAB — DIGOXIN LEVEL: Digoxin: 0.31 ng/mL

## 2014-03-25 LAB — MRSA PCR SCREENING

## 2014-03-26 ENCOUNTER — Telehealth (HOSPITAL_BASED_OUTPATIENT_CLINIC_OR_DEPARTMENT_OTHER): Payer: Self-pay | Admitting: Registered Nurse

## 2014-03-26 DIAGNOSIS — N529 Male erectile dysfunction, unspecified: Secondary | ICD-10-CM

## 2014-03-26 LAB — CBC WITH DIFFERENTIAL/PLATELET
BASOS ABS: 0 10*3/uL (ref 0.0–0.1)
Basophil %: 0.5 %
Eosinophil #: 0.3 10*3/uL (ref 0.0–0.7)
Eosinophil %: 4 %
HCT: 25.5 % — ABNORMAL LOW (ref 40.0–52.0)
HGB: 8 g/dL — ABNORMAL LOW (ref 13.0–18.0)
Lymphocyte #: 0.5 10*3/uL — ABNORMAL LOW (ref 1.0–3.6)
Lymphocyte %: 7.6 %
MCH: 24.7 pg — ABNORMAL LOW (ref 26.0–34.0)
MCHC: 31.2 g/dL — ABNORMAL LOW (ref 32.0–36.0)
MCV: 79 fL — ABNORMAL LOW (ref 80–100)
MONO ABS: 0.7 x10 3/mm (ref 0.2–1.0)
Monocyte %: 11.3 %
NEUTROS ABS: 5 10*3/uL (ref 1.4–6.5)
Neutrophil %: 76.6 %
Platelet: 166 10*3/uL (ref 150–440)
RBC: 3.22 10*6/uL — ABNORMAL LOW (ref 4.40–5.90)
RDW: 17.5 % — AB (ref 11.5–14.5)
WBC: 6.6 10*3/uL (ref 3.8–10.6)

## 2014-03-26 LAB — BASIC METABOLIC PANEL
Anion Gap: 6 — ABNORMAL LOW (ref 7–16)
BUN: 43 mg/dL — AB (ref 7–18)
CALCIUM: 8.2 mg/dL — AB (ref 8.5–10.1)
CHLORIDE: 109 mmol/L — AB (ref 98–107)
Co2: 27 mmol/L (ref 21–32)
Creatinine: 1.64 mg/dL — ABNORMAL HIGH (ref 0.60–1.30)
EGFR (African American): 55 — ABNORMAL LOW
GFR CALC NON AF AMER: 47 — AB
GLUCOSE: 83 mg/dL (ref 65–99)
OSMOLALITY: 293 (ref 275–301)
POTASSIUM: 3.4 mmol/L — AB (ref 3.5–5.1)
Sodium: 142 mmol/L (ref 136–145)

## 2014-03-26 LAB — PROTIME-INR
INR: 2.6
PROTHROMBIN TIME: 27 s — AB (ref 11.5–14.7)

## 2014-03-26 MED ORDER — ALPROSTADIL (VASODILATOR) 20 MCG IC SOLR
0.4000 mL | INTRACAVERNOUS | Status: DC
Start: 2014-03-27 — End: 2014-10-28

## 2014-03-26 NOTE — Telephone Encounter (Signed)
Pharmacy request change Edex to Caverject; pt not comfortable with Edex kit.

## 2014-03-27 ENCOUNTER — Other Ambulatory Visit (HOSPITAL_BASED_OUTPATIENT_CLINIC_OR_DEPARTMENT_OTHER): Payer: PPO | Admitting: Registered Nurse

## 2014-03-27 DIAGNOSIS — C61 Malignant neoplasm of prostate: Secondary | ICD-10-CM

## 2014-03-27 LAB — BASIC METABOLIC PANEL
ANION GAP: 5 — AB (ref 7–16)
BUN: 36 mg/dL — ABNORMAL HIGH (ref 7–18)
CO2: 26 mmol/L (ref 21–32)
CREATININE: 1.55 mg/dL — AB (ref 0.60–1.30)
Calcium, Total: 8.2 mg/dL — ABNORMAL LOW (ref 8.5–10.1)
Chloride: 110 mmol/L — ABNORMAL HIGH (ref 98–107)
EGFR (African American): 58 — ABNORMAL LOW
EGFR (Non-African Amer.): 50 — ABNORMAL LOW
Glucose: 86 mg/dL (ref 65–99)
OSMOLALITY: 289 (ref 275–301)
Potassium: 3.8 mmol/L (ref 3.5–5.1)
Sodium: 141 mmol/L (ref 136–145)

## 2014-03-27 LAB — WOUND CULTURE

## 2014-03-27 LAB — HEMATOCRIT: HCT: 25.9 % — AB (ref 40.0–52.0)

## 2014-03-27 LAB — PROTIME-INR
INR: 2.1
Prothrombin Time: 22.7 secs — ABNORMAL HIGH (ref 11.5–14.7)

## 2014-03-27 LAB — OCCULT BLOOD X 1 CARD TO LAB, STOOL: OCCULT BLOOD, FECES: NEGATIVE

## 2014-03-27 LAB — VANCOMYCIN, TROUGH
VANCOMYCIN, TROUGH: 20 ug/mL — AB (ref 10–20)
VANCOMYCIN, TROUGH: 25 ug/mL — AB (ref 10–20)

## 2014-03-27 LAB — CLOSTRIDIUM DIFFICILE(ARMC)

## 2014-03-28 LAB — BASIC METABOLIC PANEL
ANION GAP: 7 (ref 7–16)
BUN: 35 mg/dL — AB (ref 7–18)
CALCIUM: 8.2 mg/dL — AB (ref 8.5–10.1)
CO2: 22 mmol/L (ref 21–32)
Chloride: 114 mmol/L — ABNORMAL HIGH (ref 98–107)
Creatinine: 1.41 mg/dL — ABNORMAL HIGH (ref 0.60–1.30)
EGFR (African American): >60
GFR CALC NON AF AMER: 56 — AB
Glucose: 74 mg/dL (ref 65–99)
Osmolality: 292 (ref 275–301)
Potassium: 4.1 mmol/L (ref 3.5–5.1)
SODIUM: 143 mmol/L (ref 136–145)

## 2014-03-28 LAB — CBC WITH DIFFERENTIAL/PLATELET
BASOS ABS: 0.1 10*3/uL (ref 0.0–0.1)
BASOS PCT: 0.9 %
Eosinophil #: 0.2 10*3/uL (ref 0.0–0.7)
Eosinophil %: 2.9 %
HCT: 27.2 % — AB (ref 40.0–52.0)
HGB: 8.6 g/dL — ABNORMAL LOW (ref 13.0–18.0)
LYMPHS PCT: 6.5 %
Lymphocyte #: 0.5 10*3/uL — ABNORMAL LOW (ref 1.0–3.6)
MCH: 25.2 pg — ABNORMAL LOW (ref 26.0–34.0)
MCHC: 31.5 g/dL — AB (ref 32.0–36.0)
MCV: 80 fL (ref 80–100)
MONOS PCT: 9.7 %
Monocyte #: 0.8 x10 3/mm (ref 0.2–1.0)
NEUTROS ABS: 6.3 10*3/uL (ref 1.4–6.5)
Neutrophil %: 80 %
Platelet: 163 10*3/uL (ref 150–440)
RBC: 3.39 10*6/uL — AB (ref 4.40–5.90)
RDW: 17.8 % — ABNORMAL HIGH (ref 11.5–14.5)
WBC: 7.9 10*3/uL (ref 3.8–10.6)

## 2014-03-28 LAB — CULTURE, BLOOD (SINGLE)

## 2014-03-28 LAB — PROTIME-INR
INR: 1.8
PROTHROMBIN TIME: 20.8 s — AB (ref 11.5–14.7)

## 2014-03-29 ENCOUNTER — Other Ambulatory Visit (INDEPENDENT_AMBULATORY_CARE_PROVIDER_SITE_OTHER): Payer: PPO

## 2014-03-29 DIAGNOSIS — E1129 Type 2 diabetes mellitus with other diabetic kidney complication: Secondary | ICD-10-CM

## 2014-03-29 DIAGNOSIS — N058 Unspecified nephritic syndrome with other morphologic changes: Secondary | ICD-10-CM

## 2014-03-29 DIAGNOSIS — E1121 Type 2 diabetes mellitus with diabetic nephropathy: Secondary | ICD-10-CM

## 2014-03-29 LAB — PR A1C RAPID, ONSITE: Hemoglobin A1C: 5.5 % (ref 4.0–6.0)

## 2014-03-29 LAB — CBC WITH DIFFERENTIAL/PLATELET
BASOS ABS: 0.1 10*3/uL (ref 0.0–0.1)
BASOS PCT: 0.9 %
EOS ABS: 0.3 10*3/uL (ref 0.0–0.7)
Eosinophil %: 3.4 %
HCT: 25.5 % — ABNORMAL LOW (ref 40.0–52.0)
HGB: 8.1 g/dL — ABNORMAL LOW (ref 13.0–18.0)
Lymphocyte #: 0.7 10*3/uL — ABNORMAL LOW (ref 1.0–3.6)
Lymphocyte %: 8.8 %
MCH: 25.5 pg — ABNORMAL LOW (ref 26.0–34.0)
MCHC: 31.8 g/dL — ABNORMAL LOW (ref 32.0–36.0)
MCV: 80 fL (ref 80–100)
MONOS PCT: 8.7 %
Monocyte #: 0.7 x10 3/mm (ref 0.2–1.0)
Neutrophil #: 6 10*3/uL (ref 1.4–6.5)
Neutrophil %: 78.2 %
Platelet: 175 10*3/uL (ref 150–440)
RBC: 3.18 10*6/uL — ABNORMAL LOW (ref 4.40–5.90)
RDW: 17.8 % — ABNORMAL HIGH (ref 11.5–14.5)
WBC: 7.6 10*3/uL (ref 3.8–10.6)

## 2014-03-29 LAB — BASIC METABOLIC PANEL
Anion Gap: 6 — ABNORMAL LOW (ref 7–16)
BUN: 37 mg/dL — ABNORMAL HIGH (ref 7–18)
CO2: 22 mmol/L (ref 21–32)
CREATININE: 1.59 mg/dL — AB (ref 0.60–1.30)
Calcium, Total: 8.3 mg/dL — ABNORMAL LOW (ref 8.5–10.1)
Chloride: 112 mmol/L — ABNORMAL HIGH (ref 98–107)
EGFR (African American): 57 — ABNORMAL LOW
EGFR (Non-African Amer.): 49 — ABNORMAL LOW
Glucose: 77 mg/dL (ref 65–99)
OSMOLALITY: 287 (ref 275–301)
POTASSIUM: 4.4 mmol/L (ref 3.5–5.1)
SODIUM: 140 mmol/L (ref 136–145)

## 2014-03-29 LAB — PROTIME-INR
INR: 1.6
PROTHROMBIN TIME: 18.4 s — AB (ref 11.5–14.7)

## 2014-03-29 NOTE — Progress Notes (Signed)
Courtesy blood draw, [place], DX# [id number], Dr. Ethelene Hal

## 2014-03-30 LAB — PSA, DIAGNOSTIC/MONITORING: PSA, Diagnostic/Monitoring: 0.15 ng/mL (ref 0.00–4.00)

## 2014-03-30 LAB — BASIC METABOLIC PANEL
Anion Gap: 9 (ref 7–16)
BUN: 36 mg/dL — ABNORMAL HIGH (ref 7–18)
CREATININE: 1.53 mg/dL — AB (ref 0.60–1.30)
Calcium, Total: 8 mg/dL — ABNORMAL LOW (ref 8.5–10.1)
Chloride: 113 mmol/L — ABNORMAL HIGH (ref 98–107)
Co2: 20 mmol/L — ABNORMAL LOW (ref 21–32)
EGFR (Non-African Amer.): 51 — ABNORMAL LOW
GFR CALC AF AMER: 59 — AB
Glucose: 98 mg/dL (ref 65–99)
OSMOLALITY: 291 (ref 275–301)
POTASSIUM: 4.2 mmol/L (ref 3.5–5.1)
SODIUM: 142 mmol/L (ref 136–145)

## 2014-03-30 LAB — CBC WITH DIFFERENTIAL/PLATELET
BASOS ABS: 0.1 10*3/uL (ref 0.0–0.1)
BASOS PCT: 0.9 %
EOS ABS: 0.2 10*3/uL (ref 0.0–0.7)
Eosinophil %: 3.1 %
HCT: 24.6 % — AB (ref 40.0–52.0)
HGB: 7.8 g/dL — ABNORMAL LOW (ref 13.0–18.0)
LYMPHS ABS: 0.6 10*3/uL — AB (ref 1.0–3.6)
Lymphocyte %: 8 %
MCH: 25.1 pg — ABNORMAL LOW (ref 26.0–34.0)
MCHC: 31.6 g/dL — ABNORMAL LOW (ref 32.0–36.0)
MCV: 80 fL (ref 80–100)
MONO ABS: 0.8 x10 3/mm (ref 0.2–1.0)
Monocyte %: 10.7 %
Neutrophil #: 5.7 10*3/uL (ref 1.4–6.5)
Neutrophil %: 77.3 %
Platelet: 170 10*3/uL (ref 150–440)
RBC: 3.09 10*6/uL — ABNORMAL LOW (ref 4.40–5.90)
RDW: 18.2 % — ABNORMAL HIGH (ref 11.5–14.5)
WBC: 7.4 10*3/uL (ref 3.8–10.6)

## 2014-03-30 LAB — VANCOMYCIN, TROUGH: Vancomycin, Trough: 14 ug/mL (ref 10–20)

## 2014-03-31 LAB — CBC WITH DIFFERENTIAL/PLATELET
Basophil #: 0.1 10*3/uL (ref 0.0–0.1)
Basophil %: 0.9 %
EOS ABS: 0.2 10*3/uL (ref 0.0–0.7)
EOS PCT: 3.1 %
HCT: 23.9 % — ABNORMAL LOW (ref 40.0–52.0)
HGB: 7.3 g/dL — ABNORMAL LOW (ref 13.0–18.0)
LYMPHS PCT: 9.2 %
Lymphocyte #: 0.7 10*3/uL — ABNORMAL LOW (ref 1.0–3.6)
MCH: 24.6 pg — ABNORMAL LOW (ref 26.0–34.0)
MCHC: 30.5 g/dL — AB (ref 32.0–36.0)
MCV: 81 fL (ref 80–100)
MONOS PCT: 11 %
Monocyte #: 0.8 x10 3/mm (ref 0.2–1.0)
NEUTROS ABS: 5.5 10*3/uL (ref 1.4–6.5)
NEUTROS PCT: 75.8 %
PLATELETS: 178 10*3/uL (ref 150–440)
RBC: 2.96 10*6/uL — ABNORMAL LOW (ref 4.40–5.90)
RDW: 18.3 % — ABNORMAL HIGH (ref 11.5–14.5)
WBC: 7.2 10*3/uL (ref 3.8–10.6)

## 2014-03-31 LAB — BASIC METABOLIC PANEL
Anion Gap: 5 — ABNORMAL LOW (ref 7–16)
BUN: 38 mg/dL — ABNORMAL HIGH (ref 7–18)
CALCIUM: 7.9 mg/dL — AB (ref 8.5–10.1)
Chloride: 114 mmol/L — ABNORMAL HIGH (ref 98–107)
Co2: 21 mmol/L (ref 21–32)
Creatinine: 1.52 mg/dL — ABNORMAL HIGH (ref 0.60–1.30)
GFR CALC AF AMER: 60 — AB
GFR CALC NON AF AMER: 52 — AB
GLUCOSE: 74 mg/dL (ref 65–99)
Osmolality: 287 (ref 275–301)
Potassium: 4.3 mmol/L (ref 3.5–5.1)
SODIUM: 140 mmol/L (ref 136–145)

## 2014-04-01 ENCOUNTER — Telehealth (HOSPITAL_BASED_OUTPATIENT_CLINIC_OR_DEPARTMENT_OTHER): Payer: Self-pay | Admitting: Registered Nurse

## 2014-04-01 LAB — BASIC METABOLIC PANEL
Anion Gap: 5 — ABNORMAL LOW (ref 7–16)
BUN: 42 mg/dL — ABNORMAL HIGH (ref 7–18)
CALCIUM: 8.2 mg/dL — AB (ref 8.5–10.1)
CHLORIDE: 113 mmol/L — AB (ref 98–107)
Co2: 21 mmol/L (ref 21–32)
Creatinine: 1.69 mg/dL — ABNORMAL HIGH (ref 0.60–1.30)
EGFR (Non-African Amer.): 45 — ABNORMAL LOW
GFR CALC AF AMER: 53 — AB
Glucose: 146 mg/dL — ABNORMAL HIGH (ref 65–99)
OSMOLALITY: 291 (ref 275–301)
Potassium: 4.7 mmol/L (ref 3.5–5.1)
SODIUM: 139 mmol/L (ref 136–145)

## 2014-04-01 LAB — CBC WITH DIFFERENTIAL/PLATELET
Basophil #: 0.1 10*3/uL (ref 0.0–0.1)
Basophil %: 1 %
Eosinophil #: 0.2 10*3/uL (ref 0.0–0.7)
Eosinophil %: 2.9 %
HCT: 22.4 % — AB (ref 40.0–52.0)
HGB: 7 g/dL — ABNORMAL LOW (ref 13.0–18.0)
LYMPHS ABS: 0.6 10*3/uL — AB (ref 1.0–3.6)
Lymphocyte %: 8.1 %
MCH: 24.7 pg — ABNORMAL LOW (ref 26.0–34.0)
MCHC: 31 g/dL — ABNORMAL LOW (ref 32.0–36.0)
MCV: 80 fL (ref 80–100)
MONOS PCT: 10.8 %
Monocyte #: 0.7 x10 3/mm (ref 0.2–1.0)
NEUTROS PCT: 77.2 %
Neutrophil #: 5.3 10*3/uL (ref 1.4–6.5)
PLATELETS: 174 10*3/uL (ref 150–440)
RBC: 2.82 10*6/uL — AB (ref 4.40–5.90)
RDW: 18 % — ABNORMAL HIGH (ref 11.5–14.5)
WBC: 6.8 10*3/uL (ref 3.8–10.6)

## 2014-04-01 NOTE — Telephone Encounter (Signed)
Date: Mon, 01 Apr 2014 09:07:47 -0700 (PDT)  From: Gayleen Orem [email or web address]  To: JONATHAN D HARPER [email or web address]  Subject: pt who had Lap RP 05/08/13    Roger [name]   [id number]   Age: 53    FYI- and he has appt with YOU 06/17/14    DATE    PSA  03/23/14 0.15  01/03/14 0.1  09/08/13 <0.03  (before surgery 12/08/12 12.55)

## 2014-04-02 ENCOUNTER — Telehealth (HOSPITAL_BASED_OUTPATIENT_CLINIC_OR_DEPARTMENT_OTHER): Payer: Self-pay | Admitting: Registered Nurse

## 2014-04-02 ENCOUNTER — Other Ambulatory Visit (HOSPITAL_BASED_OUTPATIENT_CLINIC_OR_DEPARTMENT_OTHER): Payer: Self-pay | Admitting: Registered Nurse

## 2014-04-02 ENCOUNTER — Telehealth (HOSPITAL_BASED_OUTPATIENT_CLINIC_OR_DEPARTMENT_OTHER): Payer: Self-pay | Admitting: Urology

## 2014-04-02 DIAGNOSIS — C61 Malignant neoplasm of prostate: Secondary | ICD-10-CM

## 2014-04-02 LAB — DIGOXIN LEVEL: Digoxin: 0.82 ng/mL

## 2014-04-02 LAB — BASIC METABOLIC PANEL
Anion Gap: 7 (ref 7–16)
BUN: 41 mg/dL — AB (ref 7–18)
Calcium, Total: 8.3 mg/dL — ABNORMAL LOW (ref 8.5–10.1)
Chloride: 113 mmol/L — ABNORMAL HIGH (ref 98–107)
Co2: 20 mmol/L — ABNORMAL LOW (ref 21–32)
Creatinine: 1.55 mg/dL — ABNORMAL HIGH (ref 0.60–1.30)
EGFR (Non-African Amer.): 50 — ABNORMAL LOW
GFR CALC AF AMER: 58 — AB
Glucose: 117 mg/dL — ABNORMAL HIGH (ref 65–99)
Osmolality: 291 (ref 275–301)
Potassium: 4.3 mmol/L (ref 3.5–5.1)
SODIUM: 140 mmol/L (ref 136–145)

## 2014-04-02 LAB — CBC WITH DIFFERENTIAL/PLATELET
BASOS ABS: 0.1 10*3/uL (ref 0.0–0.1)
BASOS PCT: 1.3 %
EOS PCT: 2.4 %
Eosinophil #: 0.2 10*3/uL (ref 0.0–0.7)
HCT: 25 % — AB (ref 40.0–52.0)
HGB: 8.1 g/dL — ABNORMAL LOW (ref 13.0–18.0)
Lymphocyte #: 0.6 10*3/uL — ABNORMAL LOW (ref 1.0–3.6)
Lymphocyte %: 8.1 %
MCH: 25.8 pg — ABNORMAL LOW (ref 26.0–34.0)
MCHC: 32.5 g/dL (ref 32.0–36.0)
MCV: 79 fL — AB (ref 80–100)
MONO ABS: 0.8 x10 3/mm (ref 0.2–1.0)
Monocyte %: 10.9 %
Neutrophil #: 5.7 10*3/uL (ref 1.4–6.5)
Neutrophil %: 77.3 %
Platelet: 178 10*3/uL (ref 150–440)
RBC: 3.14 10*6/uL — ABNORMAL LOW (ref 4.40–5.90)
RDW: 17.8 % — ABNORMAL HIGH (ref 11.5–14.5)
WBC: 7.4 10*3/uL (ref 3.8–10.6)

## 2014-04-02 NOTE — Telephone Encounter (Addendum)
CONFIRMED PHONE NUMBER: [phone]  CALLERS FIRST AND LAST NAME: Tilda Franco  FACILITY NAME: N/A TITLE: N/A  CALLERS RELATIONSHIP:Self  RETURN CALL: Detailed message on voicemail only     SUBJECT: Results Request   REASON FOR REQUEST: Patient had a PSA test is is looking for results    WHO ORDERED THE TEST: Dr. Ethelene Hal  TYPE OF TEST? Other PSA  WHERE WAS THE TEST DONE: [place]  WHEN WAS THE TEST DONE: 03/30/14       ADDENDUM- I called pt told him PSA results from 4/17, and told him Dr Lincoln Maxin suggests PSA in 49mo and f/u appt (as planned 06/17/14).  I put in order for PSA for any time after 06/09/14.   Discussed pt use of SILDENAFIL which he as tried 100mg  on a number of occassions & found it didn't work, has not had INTRACORP INJECTS work of late either.  PLAN- at pt request I emailed asking clinic staff to contact pt and make him RETURN APPT to review intracorp injection technique.

## 2014-04-02 NOTE — Telephone Encounter (Signed)
CONFIRMED PHONE NUMBER: [phone]  CALLERS FIRST AND LAST NAME: Tilda Franco  FACILITY NAME: n/a TITLE: n/a  CALLERS RELATIONSHIP:Self  RETURN CALL: Detailed message on voicemail only     SUBJECT: General Message   REASON FOR REQUEST: Patient is requesting a phone call from the clinic to go over PSA results and patient had medical questions that were personal.    MESSAGE: Please call the patient back to advise on this issue. Thank you.

## 2014-04-03 LAB — BASIC METABOLIC PANEL
Anion Gap: 4 — ABNORMAL LOW (ref 7–16)
BUN: 41 mg/dL — ABNORMAL HIGH (ref 7–18)
CREATININE: 1.57 mg/dL — AB (ref 0.60–1.30)
Calcium, Total: 8.3 mg/dL — ABNORMAL LOW (ref 8.5–10.1)
Chloride: 113 mmol/L — ABNORMAL HIGH (ref 98–107)
Co2: 22 mmol/L (ref 21–32)
EGFR (African American): 57 — ABNORMAL LOW
GFR CALC NON AF AMER: 50 — AB
Glucose: 77 mg/dL (ref 65–99)
OSMOLALITY: 286 (ref 275–301)
Potassium: 4.6 mmol/L (ref 3.5–5.1)
SODIUM: 139 mmol/L (ref 136–145)

## 2014-04-03 LAB — CBC WITH DIFFERENTIAL/PLATELET
BASOS PCT: 1 %
Basophil #: 0.1 10*3/uL (ref 0.0–0.1)
EOS ABS: 0.2 10*3/uL (ref 0.0–0.7)
EOS PCT: 2.4 %
HCT: 25.2 % — ABNORMAL LOW (ref 40.0–52.0)
HGB: 7.8 g/dL — AB (ref 13.0–18.0)
LYMPHS ABS: 0.6 10*3/uL — AB (ref 1.0–3.6)
Lymphocyte %: 9 %
MCH: 25 pg — ABNORMAL LOW (ref 26.0–34.0)
MCHC: 31 g/dL — ABNORMAL LOW (ref 32.0–36.0)
MCV: 81 fL (ref 80–100)
Monocyte #: 0.9 x10 3/mm (ref 0.2–1.0)
Monocyte %: 12.8 %
NEUTROS PCT: 74.8 %
Neutrophil #: 5 10*3/uL (ref 1.4–6.5)
Platelet: 190 10*3/uL (ref 150–440)
RBC: 3.13 10*6/uL — ABNORMAL LOW (ref 4.40–5.90)
RDW: 18 % — AB (ref 11.5–14.5)
WBC: 6.7 10*3/uL (ref 3.8–10.6)

## 2014-04-03 LAB — PATHOLOGY REPORT

## 2014-04-03 LAB — OCCULT BLOOD X 1 CARD TO LAB, STOOL: Occult Blood, Feces: POSITIVE

## 2014-04-03 NOTE — Telephone Encounter (Signed)
Roger [name] spoke with him about slight rise in PSA.  We will watch closely and repeat before he sees me in 3 months.  I think this message must have been before he called him?

## 2014-04-03 NOTE — Telephone Encounter (Signed)
The pt was called and informed that we will watch closely his PSA and repeat before his appt. in 3 months.   He will have his PSA done close to home and forward the result to our clinic. His PCP has placed the order. The pt is aware of his 06/17/14 appt. The pt  was appreciative of the call.

## 2014-04-04 LAB — PROTIME-INR
INR: 1.3
PROTHROMBIN TIME: 15.9 s — AB (ref 11.5–14.7)

## 2014-04-04 LAB — CBC WITH DIFFERENTIAL/PLATELET
BASOS ABS: 0.1 10*3/uL (ref 0.0–0.1)
Basophil %: 1.4 %
EOS ABS: 0.2 10*3/uL (ref 0.0–0.7)
EOS PCT: 2.4 %
HCT: 24.6 % — ABNORMAL LOW (ref 40.0–52.0)
HGB: 7.9 g/dL — ABNORMAL LOW (ref 13.0–18.0)
LYMPHS PCT: 9.3 %
Lymphocyte #: 0.6 10*3/uL — ABNORMAL LOW (ref 1.0–3.6)
MCH: 25.7 pg — AB (ref 26.0–34.0)
MCHC: 32.2 g/dL (ref 32.0–36.0)
MCV: 80 fL (ref 80–100)
MONO ABS: 0.7 x10 3/mm (ref 0.2–1.0)
Monocyte %: 11.1 %
NEUTROS ABS: 5.1 10*3/uL (ref 1.4–6.5)
NEUTROS PCT: 75.8 %
PLATELETS: 185 10*3/uL (ref 150–440)
RBC: 3.09 10*6/uL — AB (ref 4.40–5.90)
RDW: 18.1 % — ABNORMAL HIGH (ref 11.5–14.5)
WBC: 6.7 10*3/uL (ref 3.8–10.6)

## 2014-04-04 LAB — SEDIMENTATION RATE: Erythrocyte Sed Rate: 36 mm/hr — ABNORMAL HIGH (ref 0–20)

## 2014-04-04 LAB — WOUND CULTURE

## 2014-04-05 LAB — CBC WITH DIFFERENTIAL/PLATELET
Basophil #: 0.1 10*3/uL (ref 0.0–0.1)
Basophil %: 1.4 %
Eosinophil #: 0.2 10*3/uL (ref 0.0–0.7)
Eosinophil %: 2.2 %
HCT: 24.6 % — AB (ref 40.0–52.0)
HGB: 7.8 g/dL — ABNORMAL LOW (ref 13.0–18.0)
Lymphocyte #: 0.5 10*3/uL — ABNORMAL LOW (ref 1.0–3.6)
Lymphocyte %: 6.6 %
MCH: 25.3 pg — ABNORMAL LOW (ref 26.0–34.0)
MCHC: 31.7 g/dL — ABNORMAL LOW (ref 32.0–36.0)
MCV: 80 fL (ref 80–100)
Monocyte #: 0.8 x10 3/mm (ref 0.2–1.0)
Monocyte %: 9.9 %
Neutrophil #: 6.1 10*3/uL (ref 1.4–6.5)
Neutrophil %: 79.9 %
PLATELETS: 220 10*3/uL (ref 150–440)
RBC: 3.08 10*6/uL — AB (ref 4.40–5.90)
RDW: 18.8 % — AB (ref 11.5–14.5)
WBC: 7.6 10*3/uL (ref 3.8–10.6)

## 2014-04-05 LAB — BASIC METABOLIC PANEL
Anion Gap: 8 (ref 7–16)
BUN: 37 mg/dL — ABNORMAL HIGH (ref 7–18)
CALCIUM: 8.1 mg/dL — AB (ref 8.5–10.1)
Chloride: 111 mmol/L — ABNORMAL HIGH (ref 98–107)
Co2: 21 mmol/L (ref 21–32)
Creatinine: 1.53 mg/dL — ABNORMAL HIGH (ref 0.60–1.30)
EGFR (African American): 59 — ABNORMAL LOW
GFR CALC NON AF AMER: 51 — AB
Glucose: 74 mg/dL (ref 65–99)
Osmolality: 287 (ref 275–301)
POTASSIUM: 4.8 mmol/L (ref 3.5–5.1)
SODIUM: 140 mmol/L (ref 136–145)

## 2014-04-06 LAB — HEMATOCRIT: HCT: 23.8 % — ABNORMAL LOW (ref 40.0–52.0)

## 2014-04-09 LAB — PATHOLOGY REPORT

## 2014-04-10 ENCOUNTER — Encounter (INDEPENDENT_AMBULATORY_CARE_PROVIDER_SITE_OTHER): Payer: PPO | Admitting: Internal Medicine

## 2014-05-01 ENCOUNTER — Ambulatory Visit (INDEPENDENT_AMBULATORY_CARE_PROVIDER_SITE_OTHER): Payer: PPO | Admitting: Internal Medicine

## 2014-05-01 ENCOUNTER — Encounter (INDEPENDENT_AMBULATORY_CARE_PROVIDER_SITE_OTHER): Payer: Self-pay | Admitting: Internal Medicine

## 2014-05-01 VITALS — BP 143/81 | HR 110 | Temp 98.6°F | Resp 16 | Wt 195.0 lb

## 2014-05-01 DIAGNOSIS — M549 Dorsalgia, unspecified: Secondary | ICD-10-CM

## 2014-05-01 DIAGNOSIS — N058 Unspecified nephritic syndrome with other morphologic changes: Secondary | ICD-10-CM

## 2014-05-01 DIAGNOSIS — M25569 Pain in unspecified knee: Secondary | ICD-10-CM

## 2014-05-01 DIAGNOSIS — E1129 Type 2 diabetes mellitus with other diabetic kidney complication: Secondary | ICD-10-CM

## 2014-05-01 DIAGNOSIS — E1121 Type 2 diabetes mellitus with diabetic nephropathy: Secondary | ICD-10-CM

## 2014-05-01 DIAGNOSIS — M25561 Pain in right knee: Secondary | ICD-10-CM

## 2014-05-01 MED ORDER — METFORMIN HCL 500 MG OR TABS
500.0000 mg | ORAL_TABLET | Freq: Every day | ORAL | Status: DC
Start: 2014-05-01 — End: 2014-06-06

## 2014-05-01 MED ORDER — OXYCODONE-ACETAMINOPHEN 5-325 MG OR TABS
1.0000 | ORAL_TABLET | Freq: Four times a day (QID) | ORAL | Status: DC | PRN
Start: 2014-05-01 — End: 2015-10-16

## 2014-05-01 NOTE — Progress Notes (Signed)
CHIEF COMPLAINT:  Roger [name] is a 53 year old male here to discuss knee pain.      HISTORY OF PRESENT ILLNESS OR INTERVAL HISTORY:  Pt with new onset knee pain  2 months ago.  It was sudden in onset one morning.  Roger [name] has pain with steps up and down.  Roger [name] has not tried a brace.  Advil was not helpful.  It was initially swollen, but now it is.      A1C is 5.5%    Review of Systems   Constitutional: Negative.    Cardiovascular: Negative.    Musculoskeletal:        Knee pain   Psychiatric/Behavioral: Negative.        PROBLEM LIST:  Reviewed and updated in the electronic medical record under Problem List.    This includes pertinent past medical and surgical history.    Patient Active Problem List    Diagnosis Date Noted   . Prostate cancer [185] 05/16/2013   . Anxiety [300.00] 04/29/2012   . 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 FAMILY HX GI MALIGNANCY [V16.0] 05/01/2002   . HYPERCALCEMIA [275.42] 06/11/2001   . ELEV TRANSAMINASE/LDH [790.4] 06/11/2001   . HYPERTENSION NOS [401.9] 06/02/2001   . Erectile dysfunction [607.84] 10/08/2013       MEDICATIONS:  Medication reviewed and updated in chart.      ALLERGIES:  Allergies reviewed and updated in chart.    SOCIAL HISTORY:  Social history reviewed and updated in chart.      Physical Exam   Constitutional: Roger [name] is oriented to person, place, and time and well-developed, well-nourished, and in no distress. No distress.   Cardiovascular: Normal rate, regular rhythm and normal heart sounds.  Exam reveals no gallop and no friction rub.    No murmur heard.  Pulmonary/Chest: Effort normal and breath sounds normal. No respiratory distress. Roger [name] has no wheezes. Roger [name] has no rales.   Musculoskeletal:   Right knee with minimal effusion.  There does not appear to be any joint instability.  Roger [name] has tenderness in various locations during the exam that seems to move.  Roger [name] may have some fullness in the back of his knee.  Roger [name] had tenderness on the  medial aspect as well as the lateral aspect of the knee.  Roger [name] also had some tenderness near the patella.   Neurological: Roger [name] is alert and oriented to person, place, and time.   Skin: Roger [name] is not diaphoretic.         ASSESSMENT AND PLAN:  1. Right knee pain  Patient with acute right knee pain for the past month.  I suspect Roger [name] may have a ruptured Baker's cyst causing the pain.  It does not appear to be structural as there was not an inciting event and Roger [name] has pain in various areas of his knee that seems to move.  I have ordered an x-ray to evaluate for arthritis.  And I have recommended that Roger [name] start physical therapy.  I have offered steroid injection if Roger [name] does indeed have osteoarthritis.  If Roger [name] is not improved in a month, then I would recommend that Roger [name] see a specialist.  - X-RAY KNEE 3 VIEW RIGHT  - REFERRAL TO PHYSICAL THERAPY    2. Diabetes;  Last A1C 5.5 today  LDL 82 in December  ASA yes  Nephropathy:  Positive, on max lisinopril, creatinine 0.71  Neuropathy:  Negative today  Retinopathy:  Referral placed  Blood Pressure <140/90:  controlled  Tobacco use:  no

## 2014-05-07 ENCOUNTER — Other Ambulatory Visit (INDEPENDENT_AMBULATORY_CARE_PROVIDER_SITE_OTHER): Payer: PPO

## 2014-05-07 DIAGNOSIS — E1121 Type 2 diabetes mellitus with diabetic nephropathy: Secondary | ICD-10-CM

## 2014-05-07 DIAGNOSIS — M25561 Pain in right knee: Secondary | ICD-10-CM

## 2014-05-07 DIAGNOSIS — M25569 Pain in unspecified knee: Secondary | ICD-10-CM

## 2014-05-07 DIAGNOSIS — E109 Type 1 diabetes mellitus without complications: Secondary | ICD-10-CM

## 2014-05-07 NOTE — Progress Notes (Signed)
R knee x-ray done.

## 2014-05-08 LAB — ALBUMIN/CREATININE RATIO, RANDOM URINE
Albumin (Micro), URN: 2.3 mg/dL
Albumin/Creatinine Ratio, URN: 12.2 mg/g Creat (ref <30)
Creatinine/Unit, URN: 189 mg/dL

## 2014-05-09 ENCOUNTER — Telehealth (INDEPENDENT_AMBULATORY_CARE_PROVIDER_SITE_OTHER): Payer: Self-pay | Admitting: Internal Medicine

## 2014-05-09 DIAGNOSIS — M549 Dorsalgia, unspecified: Secondary | ICD-10-CM

## 2014-05-09 NOTE — Telephone Encounter (Signed)
CONFIRMED PHONE NUMBER: [phone]  CALLERS FIRST AND LAST NAME: Tilda Franco  FACILITY NAME: n/a TITLE: n/a  CALLERS RELATIONSHIP:Self  RETURN CALL: OK to leave detailed message with anyone that answers     SUBJECT: General Message   REASON FOR REQUEST: Refill and results    MESSAGE: Patient called requesting a refill of his Oxycodone and also is wondering if his results were available from his Xray that was done on 05-07-14. Patient is requesting a call back as soon as possible.

## 2014-05-09 NOTE — Telephone Encounter (Signed)
Xray was normal.  I do not fill oxycodone outside of clinic.  I can see him as an add on tomorrow for further eval if needed.

## 2014-05-09 NOTE — Telephone Encounter (Signed)
Forwarding to provider to advise on imaging results.    Medication Refill Documentation    Name of Medication: Oxycodone    Prescribing provider:  Hermina Barters, MD    Date last filled: 05/01/14    Date last seen for this issue:  05/01/14      BP Readings from Last 2 Encounters:   05/01/14 143/81   03/04/14 147/100       Next scheduled appointment date:  Visit date not found

## 2014-05-09 NOTE — Telephone Encounter (Signed)
Talked with pt and advised x-ray normal, offered appt tomorrow for Oxycodone, but he has to work, he'll give his knee time to see if improves, if not will make appt to discuss maybe an MRI for further evaluation. Not concerned with medication right now.

## 2014-05-13 ENCOUNTER — Ambulatory Visit
Admit: 2014-05-13 | Disposition: A | Discharge status: Home / Self Care | Payer: Self-pay | Attending: Nurse Practitioner | Admitting: Nurse Practitioner

## 2014-05-13 ENCOUNTER — Encounter (INDEPENDENT_AMBULATORY_CARE_PROVIDER_SITE_OTHER): Payer: PPO | Admitting: Internal Medicine

## 2014-05-13 ENCOUNTER — Encounter (INDEPENDENT_AMBULATORY_CARE_PROVIDER_SITE_OTHER): Payer: Self-pay | Admitting: Internal Medicine

## 2014-06-06 ENCOUNTER — Other Ambulatory Visit (INDEPENDENT_AMBULATORY_CARE_PROVIDER_SITE_OTHER): Payer: Self-pay | Admitting: Internal Medicine

## 2014-06-06 DIAGNOSIS — E1121 Type 2 diabetes mellitus with diabetic nephropathy: Secondary | ICD-10-CM

## 2014-06-06 DIAGNOSIS — I10 Essential (primary) hypertension: Secondary | ICD-10-CM

## 2014-06-06 MED ORDER — METFORMIN HCL 500 MG OR TABS
ORAL_TABLET | ORAL | Status: DC
Start: 2014-06-06 — End: 2015-10-16

## 2014-06-06 MED ORDER — AMLODIPINE BESYLATE 10 MG OR TABS
ORAL_TABLET | ORAL | Status: DC
Start: 2014-06-06 — End: 2015-06-19

## 2014-06-11 ENCOUNTER — Telehealth (HOSPITAL_BASED_OUTPATIENT_CLINIC_OR_DEPARTMENT_OTHER): Payer: Self-pay

## 2014-06-11 NOTE — Telephone Encounter (Signed)
Spoke to pt to remind him to have PSA done prior to his appt 06/17/14.

## 2014-06-13 ENCOUNTER — Other Ambulatory Visit (INDEPENDENT_AMBULATORY_CARE_PROVIDER_SITE_OTHER): Payer: PPO

## 2014-06-13 ENCOUNTER — Other Ambulatory Visit (HOSPITAL_BASED_OUTPATIENT_CLINIC_OR_DEPARTMENT_OTHER): Payer: PPO | Admitting: Registered Nurse

## 2014-06-13 ENCOUNTER — Ambulatory Visit
Admit: 2014-06-13 | Discharge: 2014-06-13 | Disposition: A | Discharge status: Home / Self Care | Payer: PPO | Attending: Registered Nurse | Admitting: Registered Nurse

## 2014-06-13 DIAGNOSIS — C61 Malignant neoplasm of prostate: Secondary | ICD-10-CM | POA: Insufficient documentation

## 2014-06-13 NOTE — Progress Notes (Signed)
Courtesy blood draw for Dr. Jodi Mourning at [facility]    Tests drawn: PSA

## 2014-06-14 LAB — PSA, DIAGNOSTIC/MONITORING: PSA, Diagnostic/Monitoring: 0.19 ng/mL (ref 0.00–4.00)

## 2014-06-17 ENCOUNTER — Encounter (HOSPITAL_BASED_OUTPATIENT_CLINIC_OR_DEPARTMENT_OTHER): Payer: PPO | Admitting: Urology

## 2014-06-17 ENCOUNTER — Telehealth (HOSPITAL_BASED_OUTPATIENT_CLINIC_OR_DEPARTMENT_OTHER): Payer: Self-pay | Admitting: Registered Nurse

## 2014-06-17 NOTE — Telephone Encounter (Signed)
To      : JONATHAN D HARPER [email or web address]  Cc      :  Attchmnt:  Subject : pt s/p RP 05/08/13 LapRP by you PSA continues rising, he has 07/30/14 appt w/you is that soon enough?  Leanna Sato  ----- Message Text -----  Lynita Lombard   [id number]     Age: [place]

## 2014-06-18 ENCOUNTER — Telehealth (HOSPITAL_BASED_OUTPATIENT_CLINIC_OR_DEPARTMENT_OTHER): Payer: Self-pay | Admitting: Urology

## 2014-06-18 ENCOUNTER — Telehealth (HOSPITAL_BASED_OUTPATIENT_CLINIC_OR_DEPARTMENT_OTHER): Payer: Self-pay | Admitting: Registered Nurse

## 2014-06-18 NOTE — Telephone Encounter (Signed)
Pt cancelled his 06/17/14 appt w/Dr Lincoln Maxin, noteline said was because of work conflict.  I called pt & left voicemail reminding him of 8/18/5 appt and asking him to call me today.

## 2014-06-18 NOTE — Telephone Encounter (Signed)
CONFIRMED PHONE NUMBER: [phone]  CALLERS FIRST AND LAST NAME: Tilda Franco  FACILITY NAME: na TITLE: na  CALLERS RELATIONSHIP:Self  RETURN CALL: Detailed message on voicemail only     SUBJECT: General Message   REASON FOR REQUEST: PSA results    MESSAGE: Patient would like a call back from the doctor or nurse regarding his PSA numbers.

## 2014-06-19 NOTE — Telephone Encounter (Signed)
He can wait until August. I will refer him to rad onc as well.  He cancelled his appt with me two days ago the day of.

## 2014-06-20 ENCOUNTER — Telehealth (HOSPITAL_BASED_OUTPATIENT_CLINIC_OR_DEPARTMENT_OTHER): Payer: Self-pay | Admitting: Urology

## 2014-06-20 NOTE — Telephone Encounter (Signed)
CONFIRMED PHONE QQIWLN:[phone]  CALLERS FIRST AND LAST NAME: Roger [name]    FACILITY NAME: na TITLE: na  CALLERS RELATIONSHIP:Self  RETURN CALL: Detailed message on voicemail only     SUBJECT: Appointment Request   REASON FOR REQUEST: Patient is currently scheduled for Tuesday 8.18, as a return with Dr Jodi Mourning and is requesting to be scheduled instead for any Monday, as that is his day off. Please phone to advise.     REQUEST APPOINTMENT WITH: Jodi Mourning   REFERRING PROVIDER: n/a  REQUESTED DATE: any Monday before 8.18  REQUESTED TIME: speak with patient   UNABLE TO APPOINT: Schedule appears full

## 2014-06-21 NOTE — Telephone Encounter (Signed)
Called patient and we agreed to put him on a wait list.

## 2014-07-01 ENCOUNTER — Ambulatory Visit: Payer: PPO | Attending: Radiation Oncology | Admitting: Radiation Oncology

## 2014-07-01 ENCOUNTER — Encounter (HOSPITAL_BASED_OUTPATIENT_CLINIC_OR_DEPARTMENT_OTHER): Payer: Self-pay | Admitting: Radiation Oncology

## 2014-07-01 VITALS — BP 135/90 | HR 115 | Temp 98.3°F | Ht 68.0 in | Wt 189.6 lb

## 2014-07-01 DIAGNOSIS — C61 Malignant neoplasm of prostate: Secondary | ICD-10-CM | POA: Insufficient documentation

## 2014-07-04 NOTE — Progress Notes (Signed)
SERVICE DATE:  07/01/2014    ID:  [name] is a 53 year-old, African American gentleman with a history of prostate cancer s/p robotic assisted radical prostatectomy w/ pelvic lymph node dissection in 04/2013  [id number] Gleason 3+4, PSA 12.55 with initially undetectable postop PSA now found to be rising to 0.19. He is seen at the request of Dr. Arbie [name] for consideration of salvage radiotherapy.     HPI:    [name] pre-radical prostectomy (RP) PSA was 12.55. He had a robotic assisted RP w/ R sided nerve sparing and pelvic lymph node dissection on May 2014. Pathology showed adenocarcinoma w/ bilateral involvement, tumor volume < 1cc, no LVSI or PNI, Gleason 3+4=7, no ECE or SVI and surgical margins negative. Nodes were negative 0/4.   His PSA in September 2014 was undetectable. His PSA has been rising, however, from 0.1 (12/2013) to 0.15 (03/2014) to 0.19 (06/13/14).     Symptomatically, [name] has been doing well overall since the prostatectomy.  He reports minimal urinary symptoms with baseline nocturia x2 and no irritations, just rare incontinence,no  urgency, or frequency.  Has regular bowel movements with no bleeding.  Energy level is fair. He has been receiving weekly-biweekly penile rehabilitations with penile injections with fair results.  He has significant erectile dysfunction but is able to achieve functional erections with penile injections. He had an accidental fall 8 months ago, where he injured his lower back but denies fracture and the pain has been improving over time. Denies any new bone pain.     [name] otherwise denies fever, chills, nausea, vomiting, unintentional weight loss, abdominal pain, or lumps/bumps.    ROS: Negative except per HPI    PMH/PSH:  1. Asthma  2. DM II  3. Radical prostatectomy    ALLERGY: NKA    MEDICATIONS:  1. Alprostadil, 73mcg  2. ASA 81mg   3. Multivitamin  4. Tadalafil    SOCIAL HISTORY:  Lives with wife. Works for in the [employer] in [place].  Denies tobacco history.  Admits daily drinking of 4-5 shots due to stress - denies withdrawals in the past.  Denies illicit substance use.    FAMILY HISTORY:  Brother had either prostate or colon cancer that metastasized in his 72s.    VSS: 98.7F, BP135/90, HR115, 98% on RA. Weight 189.6lbs.    PHYSICAL EXAMS:  General: Comfortable. AAO3    HEENT: Atraumatic, normocephalic. Moist oral cavity without lesions.    Neck: No palpable cervical lympadenopathy    CV: RRR. no MRG     Resp: CTAB. No crackles/wheeze    Abdomen: Obese. Soft, nontender, nondistended.    Lower extremity: No clubbing, cyanosis, edema.    NeuroMSK: CN II - XII grossly intact. +5 Upper/lower extremities bilaterally.    DRE: No palpable masses/nodules, smooth flat prostatic fossa.    IMAGING: No recent images for review    PATHOLOGY: As per HPI    ASSESSMENT/PLAN:    [name] is a 53 year-old, African American gentleman with a history of prostate cancer s/p robotic assisted radical prostatectomy w/ pelvic lymph node dissection in 02/2013  [id number] Gleason 3+4, PSA 12.55 with initially undetectable postop PSA now found to be rising to 0.19. He is seen at the request of Dr. Arbie [name] for consideration of salvage radiotherapy.    The steady consistent rise in his PSA is suggestive most likely of low volume disease recurrence. We will request a staging bone scan and CT A/P but  discussed that with the low range of his PSA that staging studies are generally unrevealing. We discussed options of expectant management alone vs eventual ADT alone vs salvage radiotherapy +/- the addition of short course ADT. Given his young age, we'd favor being more aggressive and feel he is a reasonable candidate for considering salvage RT. We discussed prognostic factors impacting on success of salvage RT in this setting. Unfortunately, his negative margin status is a more adverse factor, but the fact his PSA did reach undetectable initially and his low PSA  at this point are favorable factors. Predictive nomograms would place his likelihood of biochemical control with salvage RT around 55%. We discussed that there are two randomized trials now (RTOG and GETUG) that suggest further improvement in biochemical control in this setting with the addition of androgen blockade with radiation, but it is still unclear whether this will translate into any improvement in OS. This could potentially increase his likelihood of biochemical control with salvage therapy to around 75%.    We reviewed logistics of radiation approached with an IMRT plan w/ CT planning encompassing the prostate fossa +/- elective coverage of the pelvis and the need for fiducial markers in the prostate fossa for daily image guidance. We reviewed the potential acute side effects of treatment including but not limited to fatigue, urinary frequency and urgency, dysuria, hemorrhoidal irritation, proctitis, loose stools and diarrhea. We reviewed the potential late risks of impotence, ejaculatory duct obstruction, rectal damage/bleeding, chronic change in bowel or urinary habits, rectal fistula, urethral stricture, bladder contracture, incontinence, cystitis, hematuria, and secondary malignancy. In general, treatment is very well-tolerated with most patients experiencing mild to moderate irritative voiding symptoms that increase during treatment, then resolve gradually in the weeks following treatment. Patients in the post-prostatectomy setting are likely at increased risk of erectile dysfunction, urinary incontinence and urethral stricture compared to patients treated de novo with primary radiotherapy.    We discussed the potential side effects associated with hormonal therapy including fatigue, hot flashes, weight gain, mood changes, gynecomastia, decreased libido, impotence, predisposition to osteoporosis, muscle weakness, and metabolic syndrome with altered cardiac lipid profile, predisposition to diabetes and  potential small risk of earlier cardiac complications such as MI.    [name] expressed understanding and all of his questions were answered to his satisfaction. He has a return visit with his urologist in 07/20/2014. We encouraged him to follow up. Meanwhile, we will proceed with the imaging studies. He seems inclined to be aggressive and was leaning toward proceeding with salvage RT and short course ADT.    Thank you for involving [place] in his care.    I saw and evaluated the patient and agree with Dr. Julianne Rice note. 60  minutes were spent in face to face time with the patient, the majority of which was spent in counseling regarding prognostics factors relating to his prostate cancer, treatment options including expectant management alone vs salvage RT, and logistics and potential side effects of radiotherapy in this setting.

## 2014-07-11 ENCOUNTER — Other Ambulatory Visit (HOSPITAL_BASED_OUTPATIENT_CLINIC_OR_DEPARTMENT_OTHER): Payer: Self-pay

## 2014-07-11 ENCOUNTER — Ambulatory Visit (HOSPITAL_BASED_OUTPATIENT_CLINIC_OR_DEPARTMENT_OTHER): Payer: Self-pay

## 2014-07-11 ENCOUNTER — Other Ambulatory Visit (HOSPITAL_BASED_OUTPATIENT_CLINIC_OR_DEPARTMENT_OTHER): Payer: PPO

## 2014-07-13 ENCOUNTER — Ambulatory Visit
Admit: 2014-07-13 | Disposition: A | Discharge status: Home / Self Care | Payer: Self-pay | Attending: Nurse Practitioner | Admitting: Nurse Practitioner

## 2014-07-15 ENCOUNTER — Other Ambulatory Visit (HOSPITAL_BASED_OUTPATIENT_CLINIC_OR_DEPARTMENT_OTHER): Payer: Self-pay | Admitting: Radiation Oncology

## 2014-07-15 ENCOUNTER — Other Ambulatory Visit (INDEPENDENT_AMBULATORY_CARE_PROVIDER_SITE_OTHER): Payer: Self-pay | Admitting: Radiation Oncology

## 2014-07-15 ENCOUNTER — Ambulatory Visit (HOSPITAL_BASED_OUTPATIENT_CLINIC_OR_DEPARTMENT_OTHER): Payer: PPO

## 2014-07-15 ENCOUNTER — Ambulatory Visit: Payer: PPO | Attending: Radiation Oncology

## 2014-07-15 DIAGNOSIS — M949 Disorder of cartilage, unspecified: Secondary | ICD-10-CM | POA: Insufficient documentation

## 2014-07-15 DIAGNOSIS — C61 Malignant neoplasm of prostate: Secondary | ICD-10-CM | POA: Insufficient documentation

## 2014-07-15 DIAGNOSIS — M899 Disorder of bone, unspecified: Secondary | ICD-10-CM

## 2014-07-15 DIAGNOSIS — K7689 Other specified diseases of liver: Secondary | ICD-10-CM

## 2014-07-15 LAB — CREATININE BY I_STAT (POC), ~~LOC~~: Creatinine (POC): 0.7 mg/dL (ref 0.51–1.18)

## 2014-07-30 ENCOUNTER — Ambulatory Visit: Payer: PPO | Attending: Urology | Admitting: Urology

## 2014-07-30 ENCOUNTER — Encounter (HOSPITAL_BASED_OUTPATIENT_CLINIC_OR_DEPARTMENT_OTHER): Payer: Self-pay | Admitting: Urology

## 2014-07-30 VITALS — BP 140/100 | HR 98 | Resp 12 | Ht 68.0 in | Wt 189.0 lb

## 2014-07-30 DIAGNOSIS — C61 Malignant neoplasm of prostate: Secondary | ICD-10-CM | POA: Insufficient documentation

## 2014-07-30 DIAGNOSIS — Q6102 Congenital multiple renal cysts: Secondary | ICD-10-CM

## 2014-07-30 DIAGNOSIS — Q618 Other cystic kidney diseases: Secondary | ICD-10-CM | POA: Insufficient documentation

## 2014-07-30 NOTE — Progress Notes (Signed)
Urology Follow-up     SUBJECTIVE:    Chief Complaint   Patient presents with   . Follow-Up      elevated PSA      History of Present Illness:  Roger [name] is a 53 year old male who returns to me for prostate cancer.  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   Margins negative.   Now with biochemical recurrence.  Otherwise feels well.  He denies urinary incontinence.  He does penile injections.  He recently saw Dr. Corlis Leak for consideration of salvage radiation therapy.  He is interested in this.  He recently had a CT and bone scan.  The bone scan showed some uptake in her rib which may have been secondary to her prior rib fracture after a fall with the last several months.      Review of Systems  Const: Neg for fever, chills, weight loss    Past Medical Hx:    Past Medical History   Diagnosis Date   . Unspecified essential hypertension 1996     on 3 drugs, some side effects from medications.    . Family history of colon cancer      brother died in 15s of colon cancer    . Type II or unspecified type diabetes mellitus without mention of complication, not stated as uncontrolled 2013   . History of erectile dysfunction        Past Surgical Hx:    Past Surgical History   Procedure Laterality Date   . Colonoscopy stoma dx including collj spec spx  2003     [facility], normal   . Colonoscopy stoma dx including collj spec spx  2011   . Prostatectomy         Active Meds:    Outpatient Prescriptions Marked as Taking for the 07/30/14 encounter (Office Visit) with Edilia Bo   Medication Sig Dispense Refill   . AmLODIPine Besylate 10 MG Oral Tab TAKE 1 TABLET BY MOUTH DAILY 90 tablet 3   . Aspirin 81 MG Oral Tab Take 1 tablet (81 mg) by mouth daily. 30 tablet    . Blood Glucose Monitoring Suppl (BLOOD GLUCOSE MONITOR KIT) Does not apply Kit one meter for testing 1-3 times per day 1 Kit 11   . Glucose Blood In Vitro Strip Use to check blood sugar up to 3 times daily 300 strip 3   . Lisinopril 40  MG Oral Tab Take 1 tablet (40 mg) by mouth daily. 90 tablet 3   . MetFORMIN HCl 500 MG Oral Tab TAKE 1 TABLET BY MOUTH ONCE DAILY 90 tablet 3   . Multiple Vitamins-Minerals (MULTIVITAMIN & MINERAL OR) 1 tab a day     . ONETOUCH DELICA LANCETS FINE Does not apply Misc Use to check blood sugar up to 3 times daily 300 each 3   . Oxycodone-Acetaminophen 5-325 MG Oral Tab Take 1 tablet by mouth every 6 hours as needed for pain. For Pain. 20 tablet 0   . Sildenafil Citrate 20 MG Oral Tab TAKE FIVE 9m tablets (1068m ONE HOUR prior to sexual activity. 50 tablet 5       Allergies:    Allergies as of 07/30/2014 - Elta Guadeloupes Reviewed 07/30/2014   Allergen Reaction Noted   . Atenolol  12/19/2001       OBJECTIVE:  Physical Exam:  BP 140/100 mmHg  Pulse 98  Resp 12  Ht 5' 8"  (1.727 m)  Wt 189 lb (85.73 kg)  BMI 28.74 kg/m2  Constitutional: WDWN, Pleasant and appropriate affect and No acute distress  Extremities: No peripheral edema    I reviewed his PET CT and bone scan.  There is no obvious metastatic disease.  Bone scan finding was mentioned above.  He does have bilateral renal cysts including liver cyst.  These do not appear concerning and are unrelated to prostate cancer.  We would like to get a baseline renal ultrasound.      IMPRESSION AND PLAN     1. 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  Prostatectomy May 2014.  Biochemical recurrence.  Tentative plans for salvage radiation with hormones.  2. ED.  Penile rehab.  Able to get erections for intercourse.  3. Diabetes.  4. Hypertension.  5. Obesity.  He has lost weight with activity and now his BMI is down to 28.7  6. Multiple bilateral renal cyst.  We'll obtain a baseline renal ultrasound.

## 2014-08-05 ENCOUNTER — Encounter (HOSPITAL_BASED_OUTPATIENT_CLINIC_OR_DEPARTMENT_OTHER): Payer: PPO

## 2014-08-12 ENCOUNTER — Encounter (HOSPITAL_BASED_OUTPATIENT_CLINIC_OR_DEPARTMENT_OTHER): Payer: PPO

## 2014-08-13 ENCOUNTER — Ambulatory Visit
Admit: 2014-08-13 | Disposition: A | Discharge status: Home / Self Care | Payer: Self-pay | Attending: Nurse Practitioner | Admitting: Nurse Practitioner

## 2014-08-26 ENCOUNTER — Ambulatory Visit: Payer: PPO | Attending: Urology

## 2014-08-26 DIAGNOSIS — Q618 Other cystic kidney diseases: Secondary | ICD-10-CM

## 2014-09-09 ENCOUNTER — Telehealth (HOSPITAL_BASED_OUTPATIENT_CLINIC_OR_DEPARTMENT_OTHER): Payer: Self-pay | Admitting: Radiation Oncology

## 2014-09-09 NOTE — Telephone Encounter (Signed)
CONFIRMED PHONE NUMBER: [phone]  CALLERS FIRST AND LAST NAME: Tilda Franco  FACILITY NAME: N/A TITLE: N/A  CALLERS RELATIONSHIP:Self  RETURN CALL: General message OK     SUBJECT: General Message   REASON FOR REQUEST: Patient states that he was suppose to start radiation treatments per Dr. Corlis Leak but hasn't heard anything since last appointment with Dr. Asking for a call back to coordinate getting orders sent to a clinic closer to where the patient lives in [place]: please advise

## 2014-09-11 NOTE — Telephone Encounter (Signed)
RN contacted Dr. Corlis Leak who will call patient and get him scheduled. Elzie Rings RN

## 2014-10-04 ENCOUNTER — Encounter (INDEPENDENT_AMBULATORY_CARE_PROVIDER_SITE_OTHER): Payer: Self-pay | Admitting: Internal Medicine

## 2014-10-04 ENCOUNTER — Ambulatory Visit (INDEPENDENT_AMBULATORY_CARE_PROVIDER_SITE_OTHER): Payer: PPO | Admitting: Internal Medicine

## 2014-10-04 ENCOUNTER — Encounter (INDEPENDENT_AMBULATORY_CARE_PROVIDER_SITE_OTHER): Payer: PPO | Admitting: Internal Medicine

## 2014-10-04 VITALS — BP 130/92 | HR 114 | Temp 98.1°F | Wt 193.4 lb

## 2014-10-04 DIAGNOSIS — R109 Unspecified abdominal pain: Secondary | ICD-10-CM

## 2014-10-04 NOTE — Patient Instructions (Signed)
I'm not certain what is going on, but I think you may have a partial bowel obstruction.    The ER is the best place to get more information quickly to get a clear diagnosis and to see how sick you are and whether you need to be admitted to the hospital.

## 2014-10-04 NOTE — Progress Notes (Signed)
Roger [name] is a 53 year old male.   Chief Complaint   Patient presents with   . Nausea     pt c/o nausea, some vomiting, excessive fatigue       History of Present Illness:   Roger [name] presents with several days abdominal pain, bloating, vomiting and dry heaves.   He moved his bowels last yesterday.   He is having some fatigue.     Past Medical History   Diagnosis Date   . Unspecified essential hypertension 1996     on 3 drugs, some side effects from medications.    . Family history of colon cancer      brother died in 16s of colon cancer    . Type II or unspecified type diabetes mellitus without mention of complication, not stated as uncontrolled 2013   . History of erectile dysfunction        Past Surgical History   Procedure Laterality Date   . Colonoscopy stoma dx including collj spec spx  2003     [facility], normal   . Colonoscopy stoma dx including collj spec spx  2011   . Prostatectomy         Patient Active Problem List   Diagnosis   . HYPERTENSION NOS   . HYPERCALCEMIA   . ELEV TRANSAMINASE/LDH   . FAMILY HX GI MALIGNANCY   . Diabetes mellitus with nephropathy   . Anxiety   . Prostate cancer   . Erectile dysfunction       Current Outpatient Prescriptions   Medication Sig Dispense Refill   . Alprostadil, Vasodilator, (CAVERJECT) 20 MCG IntraCAVernous Recon Soln 0.4 mL by IntraCAVernous route 3 times a week. No more than 1 injection/day 10 each 3   . AmLODIPine Besylate 10 MG Oral Tab TAKE 1 TABLET BY MOUTH DAILY 90 tablet 3   . Aspirin 81 MG Oral Tab Take 1 tablet (81 mg) by mouth daily. 30 tablet    . Blood Glucose Monitoring Suppl (BLOOD GLUCOSE MONITOR KIT) Does not apply Kit one meter for testing 1-3 times per day 1 Kit 11   . Glucose Blood In Vitro Strip Use to check blood sugar up to 3 times daily 300 strip 3   . Lisinopril 40 MG Oral Tab Take 1 tablet (40 mg) by mouth daily. 90 tablet 3   . MetFORMIN HCl 500 MG Oral Tab TAKE 1 TABLET BY MOUTH ONCE DAILY 90 tablet 3   . Multiple  Vitamins-Minerals (MULTIVITAMIN & MINERAL OR) 1 tab a day     . ONETOUCH DELICA LANCETS FINE Does not apply Misc Use to check blood sugar up to 3 times daily 300 each 3   . Oxycodone-Acetaminophen 5-325 MG Oral Tab Take 1 tablet by mouth every 6 hours as needed for pain. For Pain. 20 tablet 0   . Sildenafil Citrate 20 MG Oral Tab TAKE FIVE 71m tablets (1059m ONE HOUR prior to sexual activity. 50 tablet 5     No current facility-administered medications for this visit.       Family History   Problem Relation Age of Onset   . Hypertension Mother      stroke   . Cancer Brother      colon cancer, died age 54        History   Substance Use Topics   . Smoking status: Never Smoker    . Smokeless tobacco: Never Used   . Alcohol Use: 6.0 oz/week  Comment: 2 fifths of liquor a week - 12/08/12       Allergies:Atenolol      ROS:  Const; no fever  GU: currently being treated for biochemical recurrence of prostate cancer   GI;see HPI    Exam:  BP 130/92 mmHg  Pulse 114  Temp(Src) 98.1 F (36.7 C)  Wt 193 lb 6.4 oz (87.726 kg)  SpO2 97%  Gen: healthy, alert, no distress  HEENT:anicteric sclerae   Abd: moderately distended, tympanitic to percussion, decreased bowel sounds, no focal tenderness, reducible umbilical hernia   Exam otherwise deferred.    IMPRESSION/PLAN:    (R10.9) Abdominal pain, unspecified abdominal location  (primary encounter diagnosis)  Plan: exam suggestive of obstruction or ileus -recommended he go to ER for further evaluation and consideration of imaging

## 2014-10-04 NOTE — Progress Notes (Signed)
Reason for visit: nausea, vomiting, excessive fatigue    Have you seen a specialist since your last visit: NO          04/30/14 Forecasted     Due  for DM Eye exam      04/30/2014 - Last diabetes related labs:  CHOLESTEROL (LDL)  Date Value Ref Range Status   12/04/2013 82   ----------  HEMOGLOBIN A1C BY HPLC  Date Value Ref Range Status   05/09/2013 6.1* 4.0 - 6.0 %   ----------  HEMOGLOBIN A1C, RAPID  Date Value Ref Range Status   03/29/2014 5.5 4.0 - 6.0 % Corrected   ----------  ALBUMIN (MICRO), URN  Date Value Ref Range Status   04/21/2012 19.10 mg/dL   ----------    - Last diabetes related exams:  Last foot exam: 05/23/13  Last scanned eye exam: Due need referral    Lynnae January, MA    Health Maintenance   Topic Date Due    INFLUENZA VACCINE (1) 08/13/2014    TETANUS BOOSTER  11/11/2014    COLONOSCOPY Q 5 YEARS  03/11/2015    CHOLESTEROL- MALE  12/04/2018       Future Appointments  Date Time Provider Foster   10/04/2014 2:00 PM Azen, Mort Sawyers [facility]

## 2014-10-04 NOTE — Progress Notes (Signed)
Roger [name] is a 53 year old male.   Chief Complaint   Patient presents with   . Nausea     pt c/o nausea, some vomiting, excessive fatigue       History of Present Illness:   Roger [name] presents with several days abdominal pain, bloating, vomiting and dry heaves.   He moved his bowels last yesterday.   He is having some fatigue.     Past Medical History   Diagnosis Date   . Unspecified essential hypertension 1996     on 3 drugs, some side effects from medications.    . Family history of colon cancer      brother died in 59s of colon cancer    . Type II or unspecified type diabetes mellitus without mention of complication, not stated as uncontrolled 2013   . History of erectile dysfunction        Past Surgical History   Procedure Laterality Date   . Colonoscopy stoma dx including collj spec spx  2003     [facility], normal   . Colonoscopy stoma dx including collj spec spx  2011   . Prostatectomy         Patient Active Problem List   Diagnosis   . HYPERTENSION NOS   . HYPERCALCEMIA   . ELEV TRANSAMINASE/LDH   . FAMILY HX GI MALIGNANCY   . Diabetes mellitus with nephropathy   . Anxiety   . Prostate cancer   . Erectile dysfunction       Current Outpatient Prescriptions   Medication Sig Dispense Refill   . Alprostadil, Vasodilator, (CAVERJECT) 20 MCG IntraCAVernous Recon Soln 0.4 mL by IntraCAVernous route 3 times a week. No more than 1 injection/day 10 each 3   . AmLODIPine Besylate 10 MG Oral Tab TAKE 1 TABLET BY MOUTH DAILY 90 tablet 3   . Aspirin 81 MG Oral Tab Take 1 tablet (81 mg) by mouth daily. 30 tablet    . Blood Glucose Monitoring Suppl (BLOOD GLUCOSE MONITOR KIT) Does not apply Kit one meter for testing 1-3 times per day 1 Kit 11   . Glucose Blood In Vitro Strip Use to check blood sugar up to 3 times daily 300 strip 3   . Lisinopril 40 MG Oral Tab Take 1 tablet (40 mg) by mouth daily. 90 tablet 3   . MetFORMIN HCl 500 MG Oral Tab TAKE 1 TABLET BY MOUTH ONCE DAILY 90 tablet 3   . Multiple  Vitamins-Minerals (MULTIVITAMIN & MINERAL OR) 1 tab a day     . ONETOUCH DELICA LANCETS FINE Does not apply Misc Use to check blood sugar up to 3 times daily 300 each 3   . Oxycodone-Acetaminophen 5-325 MG Oral Tab Take 1 tablet by mouth every 6 hours as needed for pain. For Pain. 20 tablet 0   . Sildenafil Citrate 20 MG Oral Tab TAKE FIVE 38m tablets (1069m ONE HOUR prior to sexual activity. 50 tablet 5     No current facility-administered medications for this visit.       Family History   Problem Relation Age of Onset   . Hypertension Mother      stroke   . Cancer Brother      colon cancer, died age 54        History   Substance Use Topics   . Smoking status: Never Smoker    . Smokeless tobacco: Never Used   . Alcohol Use: 6.0 oz/week  Comment: 2 fifths of liquor a week - 12/08/12       Allergies:Atenolol      ROS:  Const; no fever  GU: currently being treated for biochemical recurrence of prostate cancer   GI;see HPI    Exam:  BP 130/92 mmHg  Pulse 114  Temp(Src) 98.1 F (36.7 C)  Wt 193 lb 6.4 oz (87.726 kg)  SpO2 97%  Gen: healthy, alert, no distress  HEENT:anicteric sclerae   Abd: moderately distended, tympanitic to percussion, decreased bowel sounds, no focal tenderness, reducible umbilical hernia   Exam otherwise deferred.    IMPRESSION/PLAN:    (R10.9) Abdominal pain, unspecified abdominal location  (primary encounter diagnosis)  Plan: exam suggestive of obstruction or ileus -recommended he go to ER for further evaluation and consideration of imaging

## 2014-10-09 ENCOUNTER — Telehealth (HOSPITAL_BASED_OUTPATIENT_CLINIC_OR_DEPARTMENT_OTHER): Payer: Self-pay | Admitting: Registered Nurse

## 2014-10-09 NOTE — Telephone Encounter (Signed)
PT left voicemail message about ED tx, I tried back at [phone] but had to leave voicemail message asking to call me back.

## 2014-10-14 IMAGING — CT CT OF THE RIGHT FOOT WITHOUT CONTRAST
3 of 4 series · 16 of 33 positions shown, 19 images · non-contrast
Comparison: DG FOOT COMPLETE 3+V*R* dated 03/23/2014

CLINICAL DATA: Lateral forefoot diabetic skin ulcer. Probable fifth
metatarsal osteomyelitis on radiographs.

EXAM:
CT OF THE RIGHT FOOT WITHOUT CONTRAST
TECHNIQUE: Multidetector CT imaging was performed according to the standard
protocol. Multiplanar CT image reconstructions were also generated.

[Series 5: foot · axial · 0.33mm/px · z∈[+76,+324]mm · 8 of 198 slices shown, 10 images]
[im 16/198  soft-tissue]
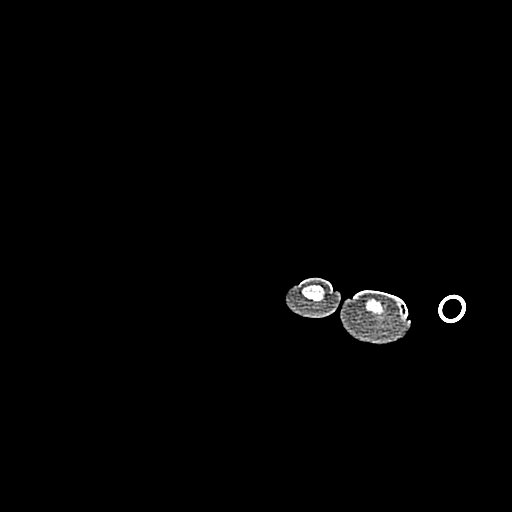
[im 16/198  bone]
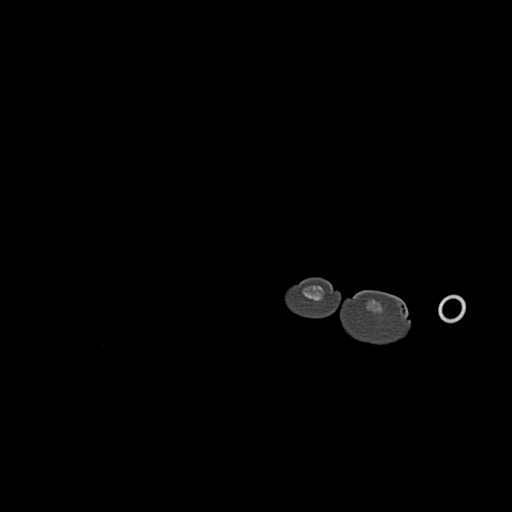
[im 46/198  bone]
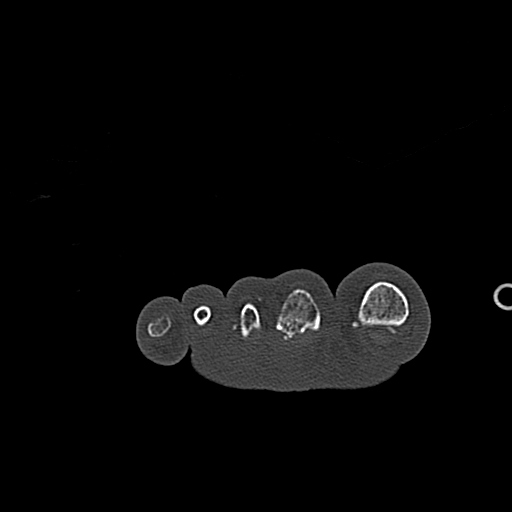
[im 61/198  bone]
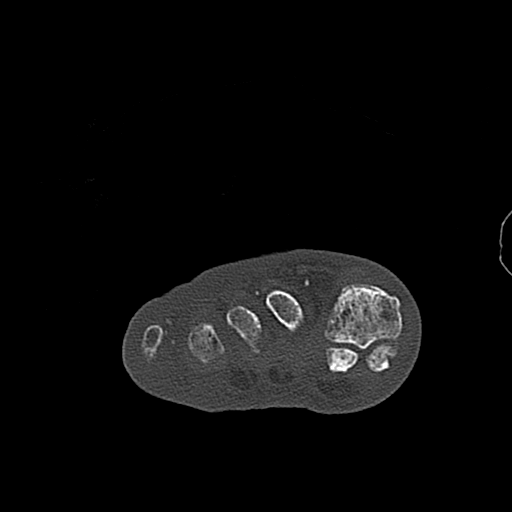
[im 91/198  bone]
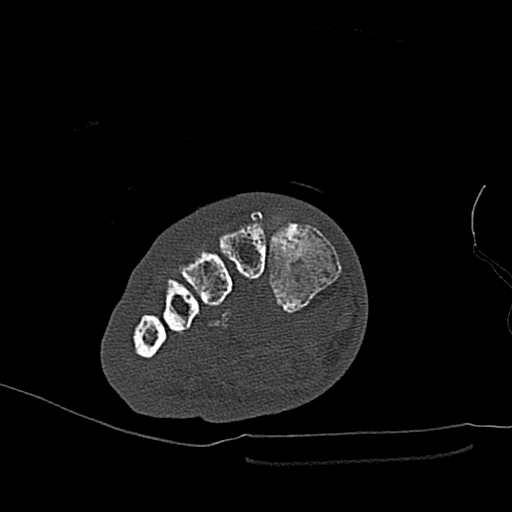
[im 107/198  soft-tissue]
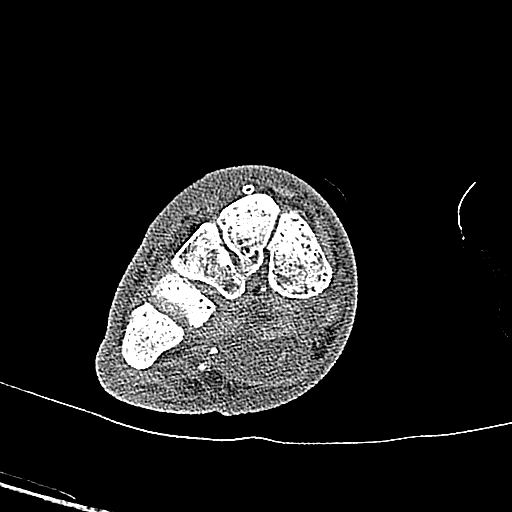
[im 107/198  bone]
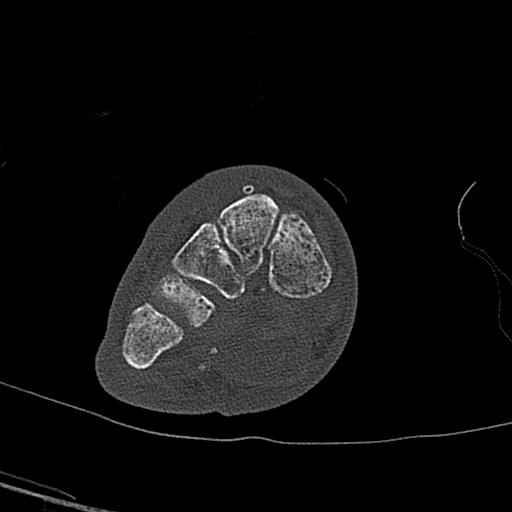
[im 137/198  bone]
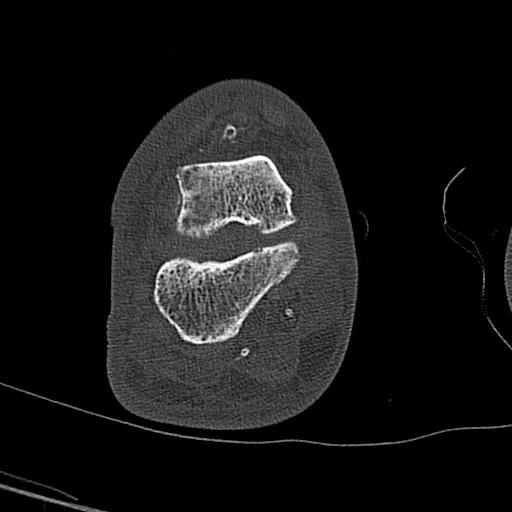
[im 152/198  bone]
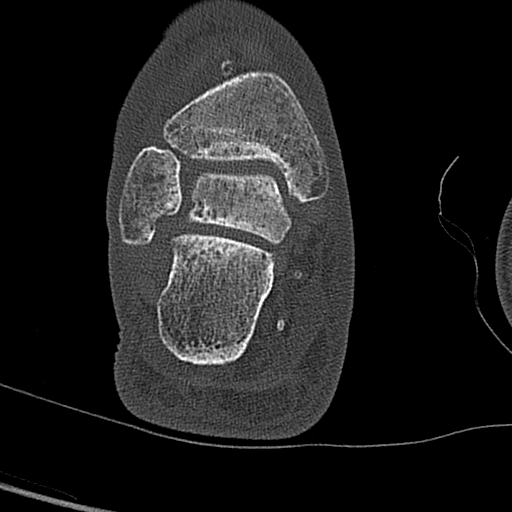
[im 182/198  bone]
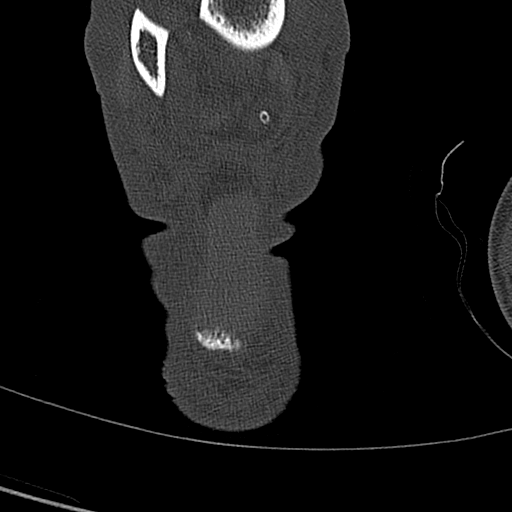

[Series 602: cor bone · coronal · 0.58mm/px · 3 of 70 slices shown]
[im 5/70  bone]
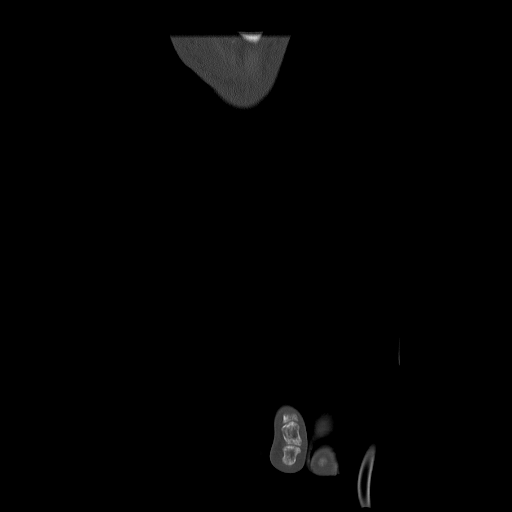
[im 36/70  bone]
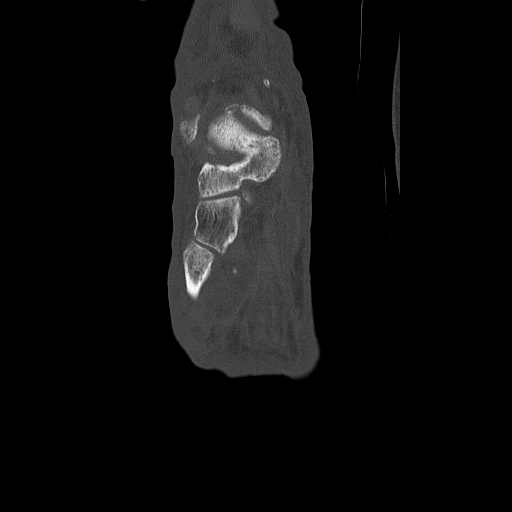
[im 68/70  bone]
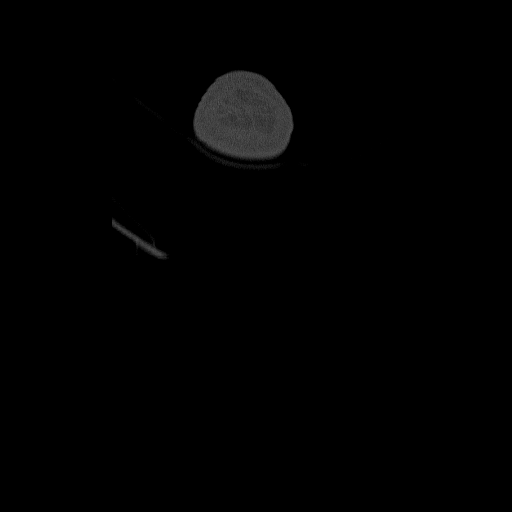

[Series 605: sag st · sagittal · 0.58mm/px · 5 of 58 slices shown, 6 images]
[im 20/58  bone]
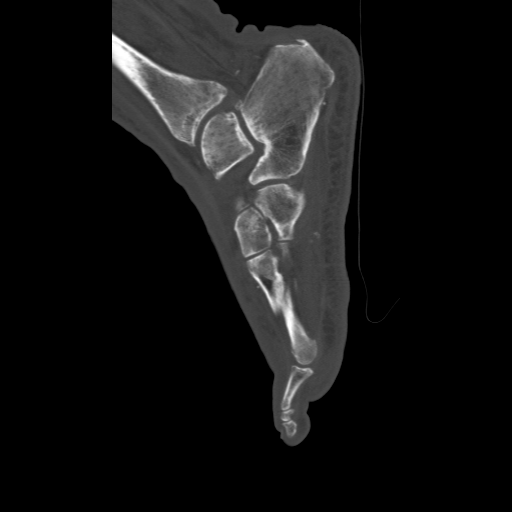
[im 24/58  bone]
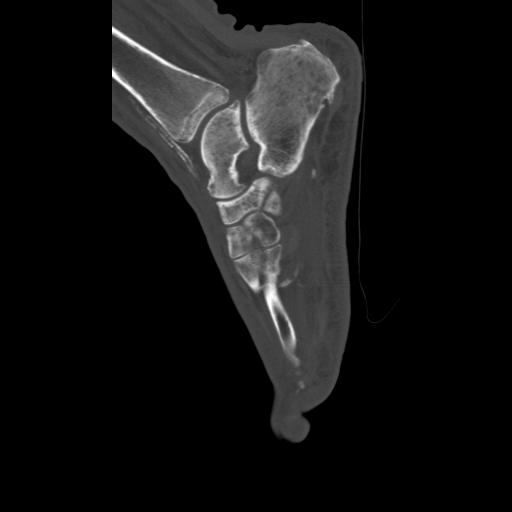
[im 29/58  soft-tissue]
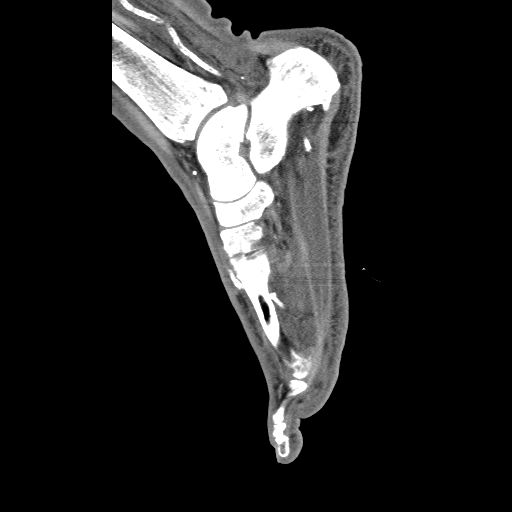
[im 29/58  bone]
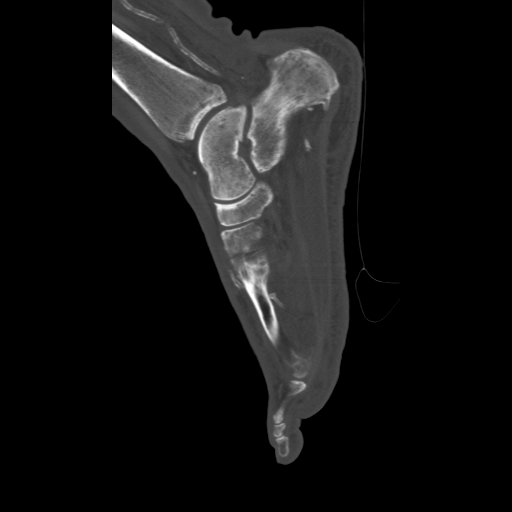
[im 34/58  bone]
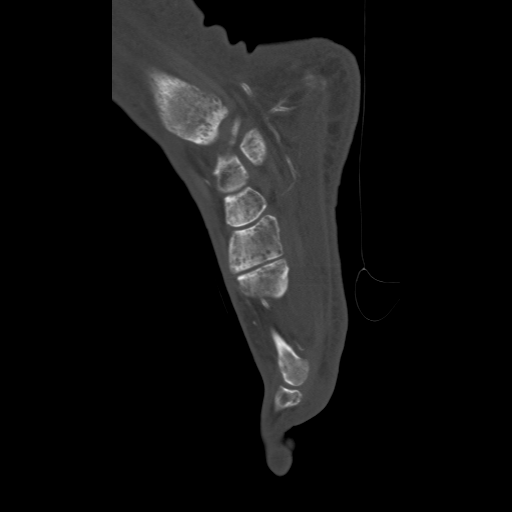
[im 39/58  bone]
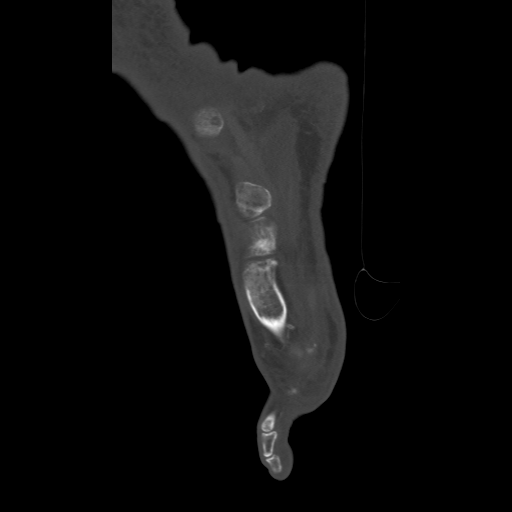

[16 of 33 positions shown; findings below may reference images not displayed]

FINDINGS: As demonstrated radiographically, there is skin and soft tissue
ulceration lateral and dorsal to the fifth metatarsal head. There is
an associated sinus tract containing air versus soft tissue
emphysema, extending nearly to the cortex. The underlying cortex of
the metatarsal head and neck is disrupted dorsally and laterally,
worrisome for osteomyelitis.

The additional metatarsals and toes appear intact. The tibial
sesamoid of the first metatarsal is bipartite. The alignment is
normal at the Lisfranc joint. The bones are diffusely demineralized
with multiple mottled lucencies, likely related to disuse
osteoporosis.

There is generalized subcutaneous and muscular edema throughout the
ankle and foot. No focal fluid collections are identified. Diffuse
vascular calcifications are noted.
IMPRESSION: CT confirms the presence of cortical disruption and irregular
erosion of the fifth metatarsal head adjacent to a prominent skin
ulcer, remaining concerning for osteomyelitis. There is generalized
soft tissue edema throughout the foot and ankle.

## 2014-10-28 ENCOUNTER — Telehealth (HOSPITAL_BASED_OUTPATIENT_CLINIC_OR_DEPARTMENT_OTHER): Payer: Self-pay | Admitting: Registered Nurse

## 2014-10-28 DIAGNOSIS — N5231 Erectile dysfunction following radical prostatectomy: Secondary | ICD-10-CM

## 2014-10-28 MED ORDER — ALPROSTADIL (VASODILATOR) 20 MCG IC SOLR
INTRACAVERNOUS | Status: AC
Start: 2014-10-28 — End: ?

## 2014-10-28 MED ORDER — SILDENAFIL CITRATE 20 MG OR TABS
ORAL_TABLET | ORAL | Status: DC
Start: 2014-10-28 — End: 2015-10-16

## 2014-10-29 NOTE — Telephone Encounter (Signed)
See other note

## 2014-11-11 ENCOUNTER — Other Ambulatory Visit: Payer: Self-pay | Admitting: Registered Nurse

## 2014-11-11 DIAGNOSIS — N521 Erectile dysfunction due to diseases classified elsewhere: Secondary | ICD-10-CM

## 2014-11-11 NOTE — Telephone Encounter (Signed)
Not on the med list  The patient last received this medication at the requesting pharmacy on               03/17/14

## 2014-11-12 MED ORDER — TADALAFIL 5 MG OR TABS
5.0000 mg | ORAL_TABLET | Freq: Every day | ORAL | Status: DC | PRN
Start: 2014-11-12 — End: 2015-10-16

## 2014-11-12 NOTE — Telephone Encounter (Signed)
Clarification needed prior to refill approval or denial:    Tadalafil 5mg  requires your authorization because it was discontinued off medication list on 12/04/13 but patient is now requesting a refill.    Reason for discontinuation: none provided but pt is currently on Viagra.     Do you want to switch pt to Cialis or is pt to remain on Viagra?  If patient is to remain on Viagra please note 10/28/14 Viagra RX is listed as "normal". No E-RX was sent to [facility].  Was RX given to patient, faxed in or phoned in? [facility] will either need a new RX or they will need to contact [facility] for RX transfer.      If medication is DISCONTINUED please "refuse all" medication requests and have your staff contact the patient.  If medication is to be continued please add refills, sign order and close encounter.

## 2014-11-16 ENCOUNTER — Other Ambulatory Visit (INDEPENDENT_AMBULATORY_CARE_PROVIDER_SITE_OTHER): Payer: Self-pay | Admitting: Internal Medicine

## 2014-11-16 DIAGNOSIS — I10 Essential (primary) hypertension: Secondary | ICD-10-CM

## 2014-11-19 MED ORDER — LISINOPRIL 40 MG OR TABS
ORAL_TABLET | ORAL | Status: DC
Start: 2014-11-19 — End: 2015-03-10

## 2015-01-13 ENCOUNTER — Other Ambulatory Visit: Payer: Self-pay | Admitting: Internal Medicine

## 2015-01-13 DIAGNOSIS — E1121 Type 2 diabetes mellitus with diabetic nephropathy: Secondary | ICD-10-CM

## 2015-01-15 MED ORDER — ONETOUCH ULTRA BLUE VI STRP
ORAL_STRIP | Status: DC
Start: 2015-01-15 — End: 2015-10-24

## 2015-02-24 ENCOUNTER — Emergency Department: Admit: 2015-02-25 | Payer: MEDICAID | Primary: Family Medicine

## 2015-02-24 DIAGNOSIS — F10231 Alcohol dependence with withdrawal delirium: Secondary | ICD-10-CM

## 2015-02-24 NOTE — ED Notes (Signed)
Patient to ct.

## 2015-02-24 NOTE — ED Notes (Addendum)
Pt arrives via [facility] from home for possible dts. Pt admits to daily etoh however last drink earlier today however not normal dose according to friends at home when ems arrived. [facility] arrived to find pt confused, unable to ambulate and incontinent of urine and feces. Pt a/o x2 on arrival. Unable to verbalize year. Denies pain on arrival. Admits to "a couple beers" daily, states been drinking daily for "long time". Pt arrives with tremors noted.

## 2015-02-25 ENCOUNTER — Inpatient Hospital Stay
Admit: 2015-02-25 | Discharge: 2015-03-17 | Disposition: A | Discharge status: Skilled Nursing Facility | Payer: MEDICAID | Attending: Internal Medicine | Admitting: Internal Medicine

## 2015-02-25 ENCOUNTER — Telehealth (HOSPITAL_BASED_OUTPATIENT_CLINIC_OR_DEPARTMENT_OTHER): Payer: Self-pay | Admitting: Registered Nurse

## 2015-02-25 LAB — CBC WITH AUTOMATED DIFF
ABS. BASOPHILS: 0 10*3/uL (ref 0.0–0.2)
ABS. EOSINOPHILS: 0.1 10*3/uL (ref 0.0–0.8)
ABS. IMM. GRANS.: 0 10*3/uL (ref 0.0–0.5)
ABS. LYMPHOCYTES: 0.6 10*3/uL (ref 0.5–4.6)
ABS. MONOCYTES: 0.6 10*3/uL (ref 0.1–1.3)
ABS. NEUTROPHILS: 5.7 10*3/uL (ref 1.7–8.2)
BASOPHILS: 0 % (ref 0.0–2.0)
EOSINOPHILS: 1 % (ref 0.5–7.8)
HCT: 42.4 % (ref 41.1–50.3)
HGB: 15.4 g/dL (ref 13.6–17.2)
IMMATURE GRANULOCYTES: 0.1 % (ref 0.0–5.0)
LYMPHOCYTES: 9 % — ABNORMAL LOW (ref 13–44)
MCH: 35.7 PG — ABNORMAL HIGH (ref 26.1–32.9)
MCHC: 36.3 g/dL — ABNORMAL HIGH (ref 31.4–35.0)
MCV: 98.4 FL — ABNORMAL HIGH (ref 79.6–97.8)
MONOCYTES: 9 % (ref 4.0–12.0)
MPV: 10.7 FL — ABNORMAL LOW (ref 10.8–14.1)
NEUTROPHILS: 81 % — ABNORMAL HIGH (ref 43–78)
PLATELET: 149 10*3/uL — ABNORMAL LOW (ref 150–450)
RBC: 4.31 M/uL (ref 4.23–5.67)
RDW: 11.7 % — ABNORMAL LOW (ref 11.9–14.6)
WBC: 7 10*3/uL (ref 4.3–11.1)

## 2015-02-25 LAB — EKG, 12 LEAD, INITIAL
Atrial Rate: 340 {beats}/min
Calculated R Axis: 73 degrees
Calculated T Axis: 81 degrees
Q-T Interval: 342 ms
QRS Duration: 92 ms
QTC Calculation (Bezet): 434 ms
Ventricular Rate: 97 {beats}/min

## 2015-02-25 LAB — METABOLIC PANEL, COMPREHENSIVE
A-G Ratio: 0.6 — ABNORMAL LOW (ref 1.2–3.5)
ALT (SGPT): 106 U/L — ABNORMAL HIGH (ref 12–65)
AST (SGOT): 178 U/L — ABNORMAL HIGH (ref 15–37)
Albumin: 3 g/dL — ABNORMAL LOW (ref 3.5–5.0)
Alk. phosphatase: 78 U/L (ref 50–136)
Anion gap: 13 mmol/L (ref 7–16)
BUN: 4 MG/DL — ABNORMAL LOW (ref 6–23)
Bilirubin, total: 1.2 MG/DL — ABNORMAL HIGH (ref 0.2–1.1)
CO2: 19 mmol/L — ABNORMAL LOW (ref 21–32)
Calcium: 7.9 MG/DL — ABNORMAL LOW (ref 8.3–10.4)
Chloride: 94 mmol/L — ABNORMAL LOW (ref 98–107)
Creatinine: 0.64 MG/DL — ABNORMAL LOW (ref 0.8–1.5)
GFR est AA: >60 mL/min/{1.73_m2} (ref >60)
GFR est non-AA: >60 mL/min/{1.73_m2} (ref >60)
Globulin: 4.7 g/dL — ABNORMAL HIGH (ref 2.3–3.5)
Glucose: 124 mg/dL — ABNORMAL HIGH (ref 65–100)
Potassium: 3.8 mmol/L (ref 3.5–5.1)
Protein, total: 7.7 g/dL (ref 6.3–8.2)
Sodium: 126 mmol/L — ABNORMAL LOW (ref 136–145)

## 2015-02-25 LAB — DRUG SCREEN, URINE
AMPHETAMINES: NEGATIVE
BARBITURATES: NEGATIVE
BENZODIAZEPINES: NEGATIVE
COCAINE: NEGATIVE
METHADONE: NEGATIVE
OPIATES: NEGATIVE
PCP(PHENCYCLIDINE): NEGATIVE
THC (TH-CANNABINOL): POSITIVE

## 2015-02-25 LAB — ETHYL ALCOHOL: ALCOHOL(ETHYL),SERUM: 10 MG/DL

## 2015-02-25 LAB — MRSA SCREEN - PCR (NASAL)

## 2015-02-25 LAB — MAGNESIUM: Magnesium: 1.8 mg/dL (ref 1.8–2.4)

## 2015-02-25 MED ORDER — METOPROLOL TARTRATE 25 MG TAB
25 mg | Freq: Two times a day (BID) | ORAL | Status: DC
Start: 2015-02-25 — End: 2015-02-26
  Administered 2015-02-25 – 2015-02-26 (×3): via ORAL

## 2015-02-25 MED ORDER — CHLORDIAZEPOXIDE 25 MG CAP
25 mg | Freq: Three times a day (TID) | ORAL | Status: DC
Start: 2015-02-25 — End: 2015-03-01
  Administered 2015-02-25 – 2015-03-01 (×13): via ORAL

## 2015-02-25 MED ORDER — LORAZEPAM 2 MG/ML IJ SOLN
2 mg/mL | INTRAMUSCULAR | Status: AC | PRN
Start: 2015-02-25 — End: 2015-02-25

## 2015-02-25 MED ORDER — ENOXAPARIN 40 MG/0.4 ML SUB-Q SYRINGE
40 mg/0.4 mL | SUBCUTANEOUS | Status: DC
Start: 2015-02-25 — End: 2015-03-17
  Administered 2015-02-25 – 2015-03-17 (×21): via SUBCUTANEOUS

## 2015-02-25 MED ORDER — LEVETIRACETAM 500 MG TAB
500 mg | Freq: Two times a day (BID) | ORAL | Status: DC
Start: 2015-02-25 — End: 2015-03-01
  Administered 2015-02-25 – 2015-03-01 (×9): via ORAL

## 2015-02-25 MED ORDER — MVI,ADULT NO.4,VIT K 3300 UNIT-150 MCG/10 ML INTRAVENOUS SOLUTION
3300 unit- 150 mcg/10 mL | Freq: Every day | INTRAVENOUS | Status: AC
Start: 2015-02-25 — End: 2015-02-27
  Administered 2015-02-26 – 2015-02-27 (×2): via INTRAVENOUS

## 2015-02-25 MED ORDER — FOLIC ACID 1 MG TAB
1 mg | Freq: Every day | ORAL | Status: DC
Start: 2015-02-25 — End: 2015-02-25

## 2015-02-25 MED ORDER — FOLIC ACID 1 MG TAB
1 mg | Freq: Every day | ORAL | Status: DC
Start: 2015-02-25 — End: 2015-03-02
  Administered 2015-02-28 – 2015-03-01 (×2): via ORAL

## 2015-02-25 MED ORDER — LORAZEPAM 2 MG/ML IJ SOLN
2 mg/mL | INTRAMUSCULAR | Status: DC | PRN
Start: 2015-02-25 — End: 2015-03-12
  Administered 2015-02-26 – 2015-03-08 (×21): via INTRAVENOUS

## 2015-02-25 MED ORDER — HALOPERIDOL LACTATE 5 MG/ML IJ SOLN
5 mg/mL | INTRAMUSCULAR | Status: DC | PRN
Start: 2015-02-25 — End: 2015-03-03
  Administered 2015-02-26 – 2015-03-02 (×7): via INTRAVENOUS

## 2015-02-25 MED ORDER — SODIUM CHLORIDE 0.9 % IJ SYRG
INTRAMUSCULAR | Status: DC | PRN
Start: 2015-02-25 — End: 2015-03-17

## 2015-02-25 MED ORDER — SODIUM CHLORIDE 0.9 % IV
INTRAVENOUS | Status: AC
Start: 2015-02-25 — End: 2015-03-01
  Administered 2015-02-25 – 2015-02-28 (×4): via INTRAVENOUS

## 2015-02-25 MED ORDER — LORAZEPAM 2 MG/ML IJ SOLN
2 mg/mL | INTRAMUSCULAR | Status: AC
Start: 2015-02-25 — End: 2015-02-24
  Administered 2015-02-25: 04:00:00 via INTRAVENOUS

## 2015-02-25 MED ORDER — SODIUM CHLORIDE 0.9 % IJ SYRG
Freq: Three times a day (TID) | INTRAMUSCULAR | Status: DC
Start: 2015-02-25 — End: 2015-03-05
  Administered 2015-02-25 – 2015-03-05 (×22): via INTRAVENOUS

## 2015-02-25 MED ORDER — THIAMINE HCL 100 MG TAB
100 mg | Freq: Every day | ORAL | Status: DC
Start: 2015-02-25 — End: 2015-02-25

## 2015-02-25 MED ORDER — MULTIVITS,STRESS FORMULA-ZINC TAB
Freq: Every day | ORAL | Status: DC
Start: 2015-02-25 — End: 2015-02-25

## 2015-02-25 MED ORDER — THIAMINE HCL 100 MG TAB
100 mg | Freq: Every day | ORAL | Status: DC
Start: 2015-02-25 — End: 2015-03-02
  Administered 2015-02-28 – 2015-03-01 (×2): via ORAL

## 2015-02-25 MED ORDER — ALBUTEROL SULFATE 0.083 % (0.83 MG/ML) SOLN FOR INHALATION
2.5 mg /3 mL (0.083 %) | RESPIRATORY_TRACT | Status: DC | PRN
Start: 2015-02-25 — End: 2015-03-06
  Administered 2015-02-25 – 2015-03-01 (×2): via RESPIRATORY_TRACT

## 2015-02-25 MED ORDER — THIAMINE 100 MG/ML INJECTION
100 mg/mL | Freq: Once | INTRAMUSCULAR | Status: AC
Start: 2015-02-25 — End: 2015-02-25
  Administered 2015-02-25: 04:00:00 via INTRAVENOUS

## 2015-02-25 MED ORDER — MULTIVITS,STRESS FORMULA-ZINC TAB
Freq: Every day | ORAL | Status: DC
Start: 2015-02-25 — End: 2015-03-03
  Administered 2015-02-28 – 2015-03-01 (×2): via ORAL

## 2015-02-25 MED FILL — CHLORDIAZEPOXIDE 25 MG CAP: 25 mg | ORAL | Qty: 1

## 2015-02-25 MED FILL — LEVETIRACETAM 500 MG TAB: 500 mg | ORAL | Qty: 1

## 2015-02-25 MED FILL — SODIUM CHLORIDE 0.9 % IV: INTRAVENOUS | Qty: 1000

## 2015-02-25 MED FILL — ALBUTEROL SULFATE 0.083 % (0.83 MG/ML) SOLN FOR INHALATION: 2.5 mg /3 mL (0.083 %) | RESPIRATORY_TRACT | Qty: 1

## 2015-02-25 MED FILL — METOPROLOL TARTRATE 25 MG TAB: 25 mg | ORAL | Qty: 1

## 2015-02-25 MED FILL — LOVENOX 40 MG/0.4 ML SUBCUTANEOUS SYRINGE: 40 mg/0.4 mL | SUBCUTANEOUS | Qty: 0.4

## 2015-02-25 MED FILL — LORAZEPAM 2 MG/ML IJ SOLN: 2 mg/mL | INTRAMUSCULAR | Qty: 1

## 2015-02-25 NOTE — Telephone Encounter (Signed)
Just had 2 months of radiation and returned to work 01/28/15; had caverject in freezer but it's now cloudy and seems ineffective; the cialis 5mg  daily is ineffective as well as sildenafil 100mg ; wants something stronger, the injections are painful; pt has not been here for one year; recommended he come in to see ivan and discuss a plan; there may be other options including surgical.

## 2015-02-25 NOTE — Telephone Encounter (Signed)
Left patient a message that Roger [name] is currently out of town.  I suggested that he call our clinic using option 8 and ask to speak to a nurse if he has a medical question.

## 2015-02-25 NOTE — Telephone Encounter (Signed)
CONFIRMED PHONE NUMBER: [phone]  CALLERS FIRST AND LAST NAME: [name]  FACILITY NAME: na TITLE: na  CALLERS RELATIONSHIP:Self  RETURN CALL: General message OK     [facility] only) Texting is an option for this clinic. If you would like [place] to use this option, which mobile phone number should we text to if we are unable to reach you?    SUBJECT: General Message   REASON FOR REQUEST: Consultation.    MESSAGE: Patient requested to speak with Dr. Gayleen Orem. I asked if I could let the doctor know what the call is regarding, and patient said, "No, I just need to talk to him about some men's health issues." Please contact patient. Thank you.

## 2015-02-25 NOTE — ED Notes (Signed)
Report called to CCU.

## 2015-02-25 NOTE — Progress Notes (Signed)
Accessing pt chart for pending admission.

## 2015-02-25 NOTE — Other (Deleted)
Please clarify if this patient is being treated/managed for:    UNDERWEIGHT.   BMI  17.41     Ht  5'8"      Wt   114#        =>Other Explanation of clinical findings  =>Unable to Determine (no explanation of clinical findings)        Please clarify and document your clinical opinion in the progress notes and discharge summary including the definitive and/or presumptive diagnosis, (suspected or probable), related to the above clinical findings. Please include clinical findings supporting your diagnosis.    Thank you,  Terance HartJackie Bradley RN  [phone]

## 2015-02-25 NOTE — Progress Notes (Signed)
Change of shift bedside report received from Luron, RN. Patient and chart reviewed.

## 2015-02-25 NOTE — Progress Notes (Signed)
Patient is cooperative and pleasant but somewhat impulsive at times.  He is up to toilet with one person assist.

## 2015-02-25 NOTE — Progress Notes (Signed)
2 mg's of ativan given IV. Will continue to monitor.

## 2015-02-25 NOTE — Progress Notes (Signed)
Bedside and Verbal shift change report given to Renae FicklePaul, Charity fundraiserN (oncoming nurse) by Anne FuLuron, RN Physiological scientist(offgoing nurse). Report included the following information SBAR, Kardex, Intake/Output, MAR, Accordion and Recent Results.

## 2015-02-25 NOTE — Consults (Signed)
LEAP[facility]OG PROTOCOL NOTE    Brett Becker  02/25/2015    The patient is currently in the critical care setting managed by hospitalist service with DTs.  The patient's chart is reviewed and the patient is discussed with the staff.  Patient is currently hemodynamically stable.  Patient has no needs identified for Intensivist management in the critical care setting at this time.  Please notify us if can be of assistance.  No charge billed to the patient.  Thank you.    [name], NP

## 2015-02-25 NOTE — Progress Notes (Signed)
Spiritual Care visit. Initial visit.    Spoke with patient about his hospitalization. He seemed to be anxious about leaving, so chaplain urged him to comply with doctors orders/recommendations, which he said he would do.    Prayed for his recovery.    Visit by Arelia Sneddonon Watson, M.Ed., Th.B. ,Staff Chaplain

## 2015-02-25 NOTE — ED Provider Notes (Signed)
HPI Comments: Patient can give limited history.  He evidently was poorly responsive at home and this was witnessed by people in his place of residence.  Patient has no recall of any of this.  Right now he states that he feels fine.  He is a daily heavy consumer of alcohol.  He states he had 6-8 beers tonight though I'm not certain whether this history is valid.  He has no other stated other health problems once again reliability is of concern.  [facility]. Patient has history of DTs in the past  No chest pain or palpitations that we are aware of.  No history of recent trauma that we are aware of    Patient is a 54 y.o. male presenting with delirium tremens. The history is provided by the patient and the EMS personnel.   Delirium Tremens (DTS)  Primary symptoms include: confusion, loss of consciousness and seizures.  There areno agitation present at this time.  This is a new problem. The current episode started 3 to 5 hours ago. The problem has been resolved. Suspected agents include alcohol. Associated symptoms include bladder incontinence and bowel incontinence. Pertinent negatives include no fever and no injury.        Past Medical History:   Diagnosis Date   ??? Delirium tremens ([place]) September, 2014     Admitted with DTs and seizure activity.  Found to have GI bleeding secondary to gastric ullcers.   ??? Gastric ulcer September, 2014     Found at EGD to have major gastric ulcers.       Past Surgical History:   Procedure Laterality Date   ??? Pr neurological procedure unlisted     ??? Hx back surgery       x 2   ??? Hx endoscopy  September, 2014     Major gastric ulcer         Family History:   Problem Relation Age of Onset   ??? Heart Disease Mother      CHF       History     Social History   ??? Marital Status: SINGLE     Spouse Name: N/A   ??? Number of Children: N/A   ??? Years of Education: N/A     Occupational History   ??? Disabled Not Employed      disabled secondary to back injury     Social History Main Topics   ??? Smoking status: Current Every Day Smoker -- 1.00 packs/day for 40 years     Types: Cigarettes   ??? Smokeless tobacco: Never Used   ??? Alcohol Use: Yes   ??? Drug Use: 2.00 per week      Comment: marijuana    ??? Sexual Activity: Not on file     Other Topics Concern   ??? Not on file     Social History Narrative    Lives with roommate    Previously employed in tree cutting - currently on disability with history of back injury and surgery x 2                       ALLERGIES: Codeine      Review of Systems   Constitutional: Negative for fever and chills.        Quality of information is poor   Respiratory: Negative.    Gastrointestinal: Positive for bowel incontinence.  Genitourinary: Positive for bladder incontinence.   Musculoskeletal: Negative.    Neurological: Positive for seizures, loss of consciousness and syncope.        Possible seizure at home   Psychiatric/Behavioral: Positive for confusion. Negative for behavioral problems and agitation.   All other systems reviewed and are negative.      Filed Vitals:    02/24/15 2254   BP: 162/90   Pulse: 97   Temp: 97.6 ??F (36.4 ??C)   Resp: 20   Height: '5\' 8"'  (3.532 m)   Weight: 68.04 kg (150 lb)   SpO2: 94%            Physical Exam   Constitutional: He appears well-developed and well-nourished. No distress.   Somewhat disheveled cooperative but unable give a very accurate history   HENT:   Head: Atraumatic.   Right Ear: External ear normal.   Left Ear: External ear normal.   Nose: Nose normal.   Eyes: Conjunctivae and EOM are normal. Pupils are equal, round, and reactive to light. No scleral icterus.   Neck: Neck supple.   Cardiovascular: Normal rate, regular rhythm, normal heart sounds and intact distal pulses.    Pulmonary/Chest: Effort normal. No respiratory distress. He has no wheezes.   Abdominal: Soft. There is no tenderness. There is no rebound.    Musculoskeletal: Normal range of motion. He exhibits no edema or tenderness.   Neurological: He exhibits normal muscle tone. Coordination normal.   Skin: Skin is warm and dry.   Psychiatric: Thought content normal.   Flat affect   Nursing note and vitals reviewed.       MDM  Number of Diagnoses or Management Options  Alcohol withdrawal seizure, uncomplicated [facility]):   DTs (delirium tremens) [facility]):   Diagnosis management comments: H with withdrawal seizure at home.  Manifestations of mild DTs in our department.  He is a long-term alcohol abuser with a blood alcohol of only 10 in our department.  He is had prior admissions for DTs in the past       Amount and/or Complexity of Data Reviewed  Clinical lab tests: ordered and reviewed  Tests in the radiology section of CPT??: ordered and reviewed  Decide to obtain previous medical records or to obtain history from someone other than the patient: yes  Obtain history from someone other than the patient: yes (EMS)  Discuss the patient with other providers: yes (hospitalist)    Risk of Complications, Morbidity, and/or Mortality  Presenting problems: high  Diagnostic procedures: moderate  Management options: moderate        Procedures      Recent Results (from the past 12 hour(s))   CBC WITH AUTOMATED DIFF    Collection Time: 02/24/15 11:30 PM   Result Value Ref Range    WBC 7.0 4.3 - 11.1 K/uL    RBC 4.31 4.23 - 5.67 M/uL    HGB 15.4 13.6 - 17.2 g/dL    HCT 42.4 41.1 - 50.3 %    MCV 98.4 (H) 79.6 - 97.8 FL    MCH 35.7 (H) 26.1 - 32.9 PG    MCHC 36.3 (H) 31.4 - 35.0 g/dL    RDW 11.7 (L) 11.9 - 14.6 %    PLATELET 149 (L) 150 - 450 K/uL    MPV 10.7 (L) 10.8 - 14.1 FL    DF AUTOMATED      NEUTROPHILS 81 (H) 43 - 78 %    LYMPHOCYTES 9 (L) 13 - 44 %  MONOCYTES 9 4.0 - 12.0 %    EOSINOPHILS 1 0.5 - 7.8 %    BASOPHILS 0 0.0 - 2.0 %    IMMATURE GRANULOCYTES 0.1 0.0 - 5.0 %    ABS. NEUTROPHILS 5.7 1.7 - 8.2 K/UL    ABS. LYMPHOCYTES 0.6 0.5 - 4.6 K/UL     ABS. MONOCYTES 0.6 0.1 - 1.3 K/UL    ABS. EOSINOPHILS 0.1 0.0 - 0.8 K/UL    ABS. BASOPHILS 0.0 0.0 - 0.2 K/UL    ABS. IMM. GRANS. 0.0 0.0 - 0.5 K/UL   METABOLIC PANEL, COMPREHENSIVE    Collection Time: 02/24/15 11:30 PM   Result Value Ref Range    Sodium 126 (L) 136 - 145 mmol/L    Potassium 3.8 3.5 - 5.1 mmol/L    Chloride 94 (L) 98 - 107 mmol/L    CO2 19 (L) 21 - 32 mmol/L    Anion gap 13 7 - 16 mmol/L    Glucose 124 (H) 65 - 100 mg/dL    BUN 4 (L) 6 - 23 MG/DL    Creatinine 0.64 (L) 0.8 - 1.5 MG/DL    GFR est AA >60 >60 ml/min/1.77m    GFR est non-AA >60 >60 ml/min/1.741m   Calcium 7.9 (L) 8.3 - 10.4 MG/DL    Bilirubin, total 1.2 (H) 0.2 - 1.1 MG/DL    ALT 106 (H) 12 - 65 U/L    AST 178 (H) 15 - 37 U/L    Alk. phosphatase 78 50 - 136 U/L    Protein, total 7.7 6.3 - 8.2 g/dL    Albumin 3.0 (L) 3.5 - 5.0 g/dL    Globulin 4.7 (H) 2.3 - 3.5 g/dL    A-G Ratio 0.6 (L) 1.2 - 3.5     ETHYL ALCOHOL    Collection Time: 02/24/15 11:30 PM   Result Value Ref Range    ALCOHOL(ETHYL),SERUM 10 MG/DL

## 2015-02-25 NOTE — Progress Notes (Signed)
Upon initial Shift Assessment patient's CIWA=8.  HE C/O SOB.  Breathing treatment initiated.

## 2015-02-25 NOTE — Progress Notes (Signed)
Bedside report given to Luron, RN.

## 2015-02-25 NOTE — H&P (Addendum)
HOSPITALIST H&P    NAME:  Brett Becker   Age:  54 y.o.  DOB:   [birth year]   MRN:   [id number]  PCP: Lacey Jensen, MD  Chief complaint: AMS    Subjective:     Patient is a 54 y.o. white  male who presents with altered mental status.  Pt has prior admission by me for DT's in 2014.  He was brought to the ED by a friend due to diminished responsiveness and seizure at home.  No family or friends present at this time.  Pt able to give limited history due to lethargy.  He denies ever having had a seizure in his life, though he has had documented seizures previously.  Pt states he normally drinks 6-8 beers per day and hadn't been drinking as much today.  He denies chest pain, dyspnea, nausea, vomiting, abdominal pain.  He reports normally having a tremor, but it is worse at the present moment.  He also reports to me that he saw a red ball bouncing around his house today, but knew that it wasn't actually there.    Past Medical History   Diagnosis Date   ??? Delirium tremens ([place]) September, 2014     Admitted with DTs and seizure activity.  Found to have GI bleeding secondary to gastric ullcers.   ??? Gastric ulcer September, 2014     Found at EGD to have major gastric ulcers.       Past Surgical History   Procedure Laterality Date   ??? Pr neurological procedure unlisted     ??? Hx back surgery       x 2   ??? Hx endoscopy  September, 2014     Major gastric ulcer       No current facility-administered medications on file prior to encounter.     No current outpatient prescriptions on file prior to encounter.       Allergies   Allergen Reactions   ??? Codeine Rash       History   Substance Use Topics   ??? Smoking status: Current Every Day Smoker -- 1.00 packs/day for 40 years     Types: Cigarettes   ??? Smokeless tobacco: Never Used   ??? Alcohol Use: Yes       Family History   Problem Relation Age of Onset   ??? Heart Disease Mother      CHF       I have personally reviewed and reconciled patients history.    Review of Systems   A comprehensive 12 point review of systems is negative other than what is listed above.    Objective:     Patient Vitals for the past 24 hrs:   BP Temp Pulse Resp SpO2 Height Weight   02/25/15 0100 145/88 mmHg 97.6 ??F (36.4 ??C) (!) 101 16 96 % - -   02/25/15 0035 158/83 mmHg - 98 18 96 % - -   02/24/15 2330 (!) 155/94 mmHg - (!) 105 29 93 % - -   02/24/15 2300 155/81 mmHg - 96 21 93 % - -   02/24/15 2254 162/90 mmHg 97.6 ??F (36.4 ??C) 97 20 94 % _0  (1.727 m) 68.04 kg (150 lb)   02/24/15 2252 - - 99 22 93 % - -   02/24/15 2251 162/90 mmHg - - - - - -       Exam:  General: awake, alert, no apparent distress  Eyes: PERRL, anicteric  Neck:  Supple, trachea midline, no palpable lymphadenopathy  Lungs: Clear to auscultation bilaterally.  No rales, wheezes, or rhonchi  Heart: Regular rhythm and tachycardic.  No appreciable murmur.  Abdomen: Soft, nontender, nondistended.  Bowel sounds normal.  No rebound tenderness, guarding, or rigidity.  Extremities:  No LE edema.  Skin: Warm/dry. No rashes or lesions.  Neurologic: CN II-XII grossly intact bilaterally.  Bilateral UE tremors worse with movement.  No focal deficits.  Psych: AOx3.  Flat affect.      Data Review (Labs):   Recent Results (from the past 24 hour(s))   CBC WITH AUTOMATED DIFF    Collection Time: 02/24/15 11:30 PM   Result Value Ref Range    WBC 7.0 4.3 - 11.1 K/uL    RBC 4.31 4.23 - 5.67 M/uL    HGB 15.4 13.6 - 17.2 g/dL    HCT 42.4 41.1 - 50.3 %    MCV 98.4 (H) 79.6 - 97.8 FL    MCH 35.7 (H) 26.1 - 32.9 PG    MCHC 36.3 (H) 31.4 - 35.0 g/dL    RDW 11.7 (L) 11.9 - 14.6 %    PLATELET 149 (L) 150 - 450 K/uL    MPV 10.7 (L) 10.8 - 14.1 FL    DF AUTOMATED      NEUTROPHILS 81 (H) 43 - 78 %    LYMPHOCYTES 9 (L) 13 - 44 %    MONOCYTES 9 4.0 - 12.0 %    EOSINOPHILS 1 0.5 - 7.8 %    BASOPHILS 0 0.0 - 2.0 %    IMMATURE GRANULOCYTES 0.1 0.0 - 5.0 %    ABS. NEUTROPHILS 5.7 1.7 - 8.2 K/UL    ABS. LYMPHOCYTES 0.6 0.5 - 4.6 K/UL    ABS. MONOCYTES 0.6 0.1 - 1.3 K/UL     ABS. EOSINOPHILS 0.1 0.0 - 0.8 K/UL    ABS. BASOPHILS 0.0 0.0 - 0.2 K/UL    ABS. IMM. GRANS. 0.0 0.0 - 0.5 K/UL   METABOLIC PANEL, COMPREHENSIVE    Collection Time: 02/24/15 11:30 PM   Result Value Ref Range    Sodium 126 (L) 136 - 145 mmol/L    Potassium 3.8 3.5 - 5.1 mmol/L    Chloride 94 (L) 98 - 107 mmol/L    CO2 19 (L) 21 - 32 mmol/L    Anion gap 13 7 - 16 mmol/L    Glucose 124 (H) 65 - 100 mg/dL    BUN 4 (L) 6 - 23 MG/DL    Creatinine 0.64 (L) 0.8 - 1.5 MG/DL    GFR est AA >60 >60 ml/min/1.7m    GFR est non-AA >60 >60 ml/min/1.765m   Calcium 7.9 (L) 8.3 - 10.4 MG/DL    Bilirubin, total 1.2 (H) 0.2 - 1.1 MG/DL    ALT 106 (H) 12 - 65 U/L    AST 178 (H) 15 - 37 U/L    Alk. phosphatase 78 50 - 136 U/L    Protein, total 7.7 6.3 - 8.2 g/dL    Albumin 3.0 (L) 3.5 - 5.0 g/dL    Globulin 4.7 (H) 2.3 - 3.5 g/dL    A-G Ratio 0.6 (L) 1.2 - 3.5     ETHYL ALCOHOL    Collection Time: 02/24/15 11:30 PM   Result Value Ref Range    ALCOHOL(ETHYL),SERUM 10 MG/DL       Assessment:   Principal Problem:    Delirium tremens ([place](02/25/2015)    Active Problems:    Alcohol  abuse (08/28/2013)      HTN (hypertension) (08/30/2013)      Hepatitis C (08/31/2013)      Hyponatremia (02/25/2015)        Plan:     Admit to ICU  Ativan PRN per [facility] protocol  IVF  Add on Mg  Thiamine, folic acid, MVI  Scheduled Librium    DVT prophylaxis: Lovenox  Code Status: FULL    Plan of care discussed with: patient  Time spent on patient care: 30 minutes  Anticipated date of discharge: 3 days    Tod Persia, DO

## 2015-02-25 NOTE — Other (Signed)
Interdisciplinary team rounds were held 02/25/2015 with the following team members:Nursing, Nurse Practitioner, Nutrition, Palliative Care, Pastoral Care, Pharmacy, Physician, Respiratory Therapy, Wound Care and Clinical Coordinator and the patient.    Plan of care discussed. See clinical pathway and/or care plan for interventions and desired outcomes.

## 2015-02-25 NOTE — Progress Notes (Signed)
Hospitalist Follow Up     02/25/2015       NAME: Brett Becker   DOB:  01-27-1961   MRN:  [id number]   Attending: Caffie PintoAlexander M Drew, DO  PCP:  Sol BlazingPhys Other, MD  Treatment Team: Attending Provider: Caffie PintoAlexander M Drew, DO    Patient admitted after midnight    BP 140/93 mmHg   Pulse 100   Temp(Src) 98.5 ??F (36.9 ??C)   Resp 25   Ht 5\' 8"  (1.727 m)   Wt 51.937 kg (114 lb 8 oz)   BMI 17.41 kg/m2   SpO2 97%  Active Hospital Problems    Diagnosis Date Noted   ??? 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 Hyponatremia 02/25/2015   ??? Hepatitis C 08/31/2013   ??? HTN (hypertension) 08/30/2013   ??? Alcohol abuse 08/28/2013     Plan:   Add NS @ 100 cc/hr.  Monitor Na levels  Continue DT precautions   Add metoprolol 25 mg bid, monitor bp and HR  Reported possible seizure activity.  Reviewed CT head without acute abnormalities.  Likely related to hyponatremia and alcohol abuse.  Monitor, started keppra.    Matilde HaymakerWilliam T Anelia Carriveau, MD

## 2015-02-25 NOTE — Progress Notes (Signed)
TRANSFER - IN REPORT:    Verbal report received from TimberlaneBrandy, RN on Brett Becker  being received from ED for routine progression of care      Report consisted of patient???s Situation, Background, Assessment and   Recommendations(SBAR).     Information from the following report(s) SBAR, Intake/Output, MAR, Recent Results, Med Rec Status and Cardiac Rhythm ST was reviewed with the receiving nurse.    Opportunity for questions and clarification was provided.      Assessment completed upon patient???s arrival to unit and care assumed.

## 2015-02-25 NOTE — Progress Notes (Signed)
Dual skin assessment completed with Renae FicklePaul, RN. Skin is warm and dry. Multiple scattered abrasions noted all over bue, ble, abdomen and sacrum. Raised rash noted to sacrum, alevyn placed. Dry flaky skin noted to heels.     Patient arrived to unit via [place], Charity fundraiserN. Alert and oriented to person, place, and time; did not know the year. Pt noted to be shivering and reports being cold. 98.6 temp, ST on monitor, 94% on room air. Expiratory wheezing noted bilaterally. Pt states he uses an inhaler at home and needs it. Denies pain at this time.

## 2015-02-26 ENCOUNTER — Inpatient Hospital Stay: Admit: 2015-02-26 | Payer: MEDICAID | Primary: Family Medicine

## 2015-02-26 LAB — METABOLIC PANEL, BASIC
Anion gap: 12 mmol/L (ref 7–16)
BUN: 9 MG/DL (ref 6–23)
CO2: 22 mmol/L (ref 21–32)
Calcium: 8.1 MG/DL — ABNORMAL LOW (ref 8.3–10.4)
Chloride: 100 mmol/L (ref 98–107)
Creatinine: 0.58 MG/DL — ABNORMAL LOW (ref 0.8–1.5)
GFR est AA: >60 mL/min/{1.73_m2} (ref >60)
GFR est non-AA: >60 mL/min/{1.73_m2} (ref >60)
Glucose: 82 mg/dL (ref 65–100)
Potassium: 3.7 mmol/L (ref 3.5–5.1)
Sodium: 134 mmol/L — ABNORMAL LOW (ref 136–145)

## 2015-02-26 LAB — CBC WITH AUTOMATED DIFF
ABS. BASOPHILS: 0 10*3/uL (ref 0.0–0.2)
ABS. EOSINOPHILS: 0.1 10*3/uL (ref 0.0–0.8)
ABS. IMM. GRANS.: 0 10*3/uL (ref 0.0–0.5)
ABS. LYMPHOCYTES: 1.3 10*3/uL (ref 0.5–4.6)
ABS. MONOCYTES: 0.7 10*3/uL (ref 0.1–1.3)
ABS. NEUTROPHILS: 4.7 10*3/uL (ref 1.7–8.2)
BASOPHILS: 0 % (ref 0.0–2.0)
EOSINOPHILS: 2 % (ref 0.5–7.8)
HCT: 41 % — ABNORMAL LOW (ref 41.1–50.3)
HGB: 14.4 g/dL (ref 13.6–17.2)
IMMATURE GRANULOCYTES: 0.1 % (ref 0.0–5.0)
LYMPHOCYTES: 19 % (ref 13–44)
MCH: 35.2 PG — ABNORMAL HIGH (ref 26.1–32.9)
MCHC: 35.1 g/dL — ABNORMAL HIGH (ref 31.4–35.0)
MCV: 100.2 FL — ABNORMAL HIGH (ref 79.6–97.8)
MONOCYTES: 10 % (ref 4.0–12.0)
MPV: 10.5 FL — ABNORMAL LOW (ref 10.8–14.1)
NEUTROPHILS: 69 % (ref 43–78)
PLATELET: 139 10*3/uL — ABNORMAL LOW (ref 150–450)
RBC: 4.09 M/uL — ABNORMAL LOW (ref 4.23–5.67)
RDW: 12 % (ref 11.9–14.6)
WBC: 6.9 10*3/uL (ref 4.3–11.1)

## 2015-02-26 LAB — MAGNESIUM: Magnesium: 1.6 mg/dL — ABNORMAL LOW (ref 1.8–2.4)

## 2015-02-26 MED ORDER — MAGNESIUM SULFATE 4 GRAM/100 ML IV PIGGY BACK
4 gram/100 mL ( %) | Freq: Once | INTRAVENOUS | Status: AC
Start: 2015-02-26 — End: 2015-03-06
  Administered 2015-02-26: 15:00:00 via INTRAVENOUS

## 2015-02-26 MED ORDER — IPRATROPIUM-ALBUTEROL 2.5 MG-0.5 MG/3 ML NEB SOLUTION
2.5 mg-0.5 mg/3 ml | RESPIRATORY_TRACT | Status: DC
Start: 2015-02-26 — End: 2015-02-27
  Administered 2015-02-26 – 2015-02-27 (×3): via RESPIRATORY_TRACT

## 2015-02-26 MED ORDER — METHYLPREDNISOLONE (PF) 40 MG/ML IJ SOLR
40 mg/mL | Freq: Three times a day (TID) | INTRAMUSCULAR | Status: DC
Start: 2015-02-26 — End: 2015-02-28
  Administered 2015-02-27 – 2015-02-28 (×5): via INTRAVENOUS

## 2015-02-26 MED ORDER — CLONIDINE 0.2 MG TAB
0.2 mg | Freq: Three times a day (TID) | ORAL | Status: DC
Start: 2015-02-26 — End: 2015-03-03
  Administered 2015-02-26 – 2015-03-01 (×7): via ORAL

## 2015-02-26 MED ORDER — METHYLPREDNISOLONE (PF) 40 MG/ML IJ SOLR
40 mg/mL | Freq: Three times a day (TID) | INTRAMUSCULAR | Status: DC
Start: 2015-02-26 — End: 2015-02-26
  Administered 2015-02-26: 21:00:00 via INTRAVENOUS

## 2015-02-26 MED ORDER — METOPROLOL TARTRATE 25 MG TAB
25 mg | Freq: Two times a day (BID) | ORAL | Status: DC
Start: 2015-02-26 — End: 2015-02-26

## 2015-02-26 MED ORDER — METHYLPREDNISOLONE (PF) 125 MG/2 ML IJ SOLR
125 mg/2 mL | Freq: Once | INTRAMUSCULAR | Status: AC
Start: 2015-02-26 — End: 2015-02-26
  Administered 2015-02-26: 21:00:00 via INTRAVENOUS

## 2015-02-26 MED ORDER — HYDRALAZINE 20 MG/ML IJ SOLN
20 mg/mL | Freq: Four times a day (QID) | INTRAMUSCULAR | Status: DC | PRN
Start: 2015-02-26 — End: 2015-03-17
  Administered 2015-03-02 – 2015-03-03 (×3): via INTRAVENOUS

## 2015-02-26 MED ORDER — MAGNESIUM OXIDE 400 MG TAB
400 mg | Freq: Two times a day (BID) | ORAL | Status: DC
Start: 2015-02-26 — End: 2015-03-03
  Administered 2015-02-27 – 2015-03-01 (×5): via ORAL

## 2015-02-26 MED ORDER — NICOTINE 21 MG/24 HR DAILY PATCH
21 mg/24 hr | Freq: Every day | TRANSDERMAL | Status: DC
Start: 2015-02-26 — End: 2015-03-17
  Administered 2015-03-07: 13:00:00 via TRANSDERMAL

## 2015-02-26 MED FILL — LOVENOX 40 MG/0.4 ML SUBCUTANEOUS SYRINGE: 40 mg/0.4 mL | SUBCUTANEOUS | Qty: 0.4

## 2015-02-26 MED FILL — LORAZEPAM 2 MG/ML IJ SOLN: 2 mg/mL | INTRAMUSCULAR | Qty: 1

## 2015-02-26 MED FILL — CLONIDINE 0.2 MG TAB: 0.2 mg | ORAL | Qty: 1

## 2015-02-26 MED FILL — LEVETIRACETAM 500 MG TAB: 500 mg | ORAL | Qty: 1

## 2015-02-26 MED FILL — NICOTINE 21 MG/24 HR DAILY PATCH: 21 mg/24 hr | TRANSDERMAL | Qty: 1

## 2015-02-26 MED FILL — HALOPERIDOL LACTATE 5 MG/ML IJ SOLN: 5 mg/mL | INTRAMUSCULAR | Qty: 1

## 2015-02-26 MED FILL — IPRATROPIUM-ALBUTEROL 2.5 MG-0.5 MG/3 ML NEB SOLUTION: 2.5 mg-0.5 mg/3 ml | RESPIRATORY_TRACT | Qty: 3

## 2015-02-26 MED FILL — SOLU-MEDROL (PF) 125 MG/2 ML SOLUTION FOR INJECTION: 125 mg/2 mL | INTRAMUSCULAR | Qty: 2

## 2015-02-26 MED FILL — MAGNESIUM SULFATE 4 GRAM/100 ML IV PIGGY BACK: 4 gram/100 mL ( %) | INTRAVENOUS | Qty: 100

## 2015-02-26 MED FILL — METOPROLOL TARTRATE 25 MG TAB: 25 mg | ORAL | Qty: 1

## 2015-02-26 MED FILL — DEXTROSE 5%-1/2 NORMAL SALINE IV: INTRAVENOUS | Qty: 1000

## 2015-02-26 MED FILL — CHLORDIAZEPOXIDE 25 MG CAP: 25 mg | ORAL | Qty: 1

## 2015-02-26 NOTE — Progress Notes (Signed)
Report given to RanierKaty, Charity fundraiserN. Patient and chart reviewed.

## 2015-02-26 NOTE — Progress Notes (Signed)
Late charting/entry due to [facility] downtime from 0100-0500.

## 2015-02-26 NOTE — Progress Notes (Signed)
Pt trying to climb out of bed. He is yelling at staff and is unable to be redirected. Pt is screaming profanities and illogical thoughts. He is kicking his legs and refusing to calm down. Pt given haldol and ativan per PRN order. Will continue to monitor.

## 2015-02-26 NOTE — Progress Notes (Signed)
TRANSFER - OUT REPORT:    Verbal report given to Cres[place], RN on Brett Becker  being transferred to 718 for routine progression of care       Report consisted of patient???s Situation, Background, Assessment and   Recommendations(SBAR).     Information from the following report(s) SBAR, Kardex, ED Summary, MAR, Recent Results and Cardiac Rhythm NSR was reviewed with the receiving nurse.    Lines:   Peripheral IV 09/02/13 Right Forearm (Active)       Peripheral IV 02/24/15 Left Forearm (Active)   Site Assessment Clean, dry, & intact 02/25/2015  7:00 PM   Phlebitis Assessment 0 02/25/2015  7:00 PM   Infiltration Assessment 0 02/25/2015  7:00 PM   Dressing Status Clean, dry, & intact 02/25/2015  7:00 PM   Dressing Type Transparent 02/25/2015  7:00 PM   Hub Color/Line Status Pink;Infusing 02/25/2015  7:00 PM   Action Taken Open ports on tubing capped 02/25/2015  7:00 PM   Alcohol Cap Used Yes 02/25/2015  7:00 PM        Opportunity for questions and clarification was provided.      Patient transported with:   Monitor  O2 @ 2 liters  Registered Nurse              Assessment completed upon patient???s arrival to unit and care assumed.

## 2015-02-26 NOTE — Progress Notes (Signed)
Problem: Self Care Deficits Care Plan (Adult)  Goal: *Acute Goals and Plan of Care (Insert Text)  1. Patient will feed self entire meal with set-up.   2. Patient will complete grooming with set-up.   3. Patient will tolerate 15 minutes of OT treatment with no rest breaks to increase activity tolerance for ADLs.   4. Patient will complete functional transfers with minimal assistance and adaptive equipment as needed.   5. Patient will demonstrate fair standing balance to increase safety during standing ADLs.     Timeframe: 7 visits   OCCUPATIONAL THERAPY: INITIAL ASSESSMENT, DAILY NOTE AND TREATMENT DAY: 1ST  INPATIENT: Medicaid : Hospital Day: 3    NAME/AGE/GENDER: Brett Becker is a 54 y.o. male  DATE: 02/26/2015  PRIMARY DIAGNOSIS: Delirium tremens ([place])  Delirium tremens ([place])  Delirium tremens ([place])        ICD-10: Treatment Diagnosis:   Unspecified lack of coordination (R27.9)  Dizziness and giddiness (R42)  Muscle weakness (generalized) (M62.81)      INTERDISCIPLINARY COLLABORATION: Physical Therapist, Occupational Therapist, Registered Nurse and Certified Nursing Assistant/Patient Care Technician  ASSESSMENT:   Brett Becker is a 54 year old male admitted with confusion and tremors. History of ETOH abuse. Patient supine in bed, restrained. Oriented to person only. Confused, but able to follow commands. States that he lives with a friend and he is typically independent with ADLs. He uses a bike for transportation. Required minimal assistance for supine to sit. Demonstrates poor sitting balance at edge of bed with posterior lean. Required minimal to moderate assistance for self feeding due to BUE weakness and decreased coordination. He was able to stand with moderate assistance x2 and again, had a strong posterior lean that he was unable to correct on his own. He required total assistance for bladder incontinence and maximal assistance to don hospital gown. Patient continued to be  disoriented to place and situation despite several attempts by therapists to reorient. He is currently requiring assistance with all ADLs and is greatly limited by cognition, decreased balance, and overall generalized weakness. Will plan to follow for acute OT as patient is able to participate.       ????????This section established at most recent assessment??????????  PROBLEM LIST (Impairments causing functional limitations):  1. Decreased Strength effecting function  2. Decreased ADL/Functional Activities  3. Decreased Transfer Abilities  4. Decreased Ambulation Ability/Technique  5. Decreased Balance  6. Decreased Activity Tolerance  7. Impaired balance affecting function  8. Impaired coordination affecting function  REHABILITATION POTENTIAL FOR STATED GOALS: GUARDED  PLAN OF CARE:  INTERVENTIONS PLANNED: (Benefits and precautions of occupational therapy have been discussed with the patient.)  1. Activities of daily living training  2. Adaptive equipment training  3. Cognitive training  4. Donning&doffing training  5. Group therapy  6. Theraputic activity  7. Theraputic exercise  FREQUENCY/DURATION: Follow patient 1-2 times per day/3-5 days per week until goals are met in order to address above goals.    RECOMMENDED REHABILITATION/EQUIPMENT: (at time of discharge pending progress):   Continue OT.  SUBJECTIVE:   "I drive my bike."    History of Present Injury/Illness: Per H&P, "Patient is a 55 y.o. white  male who presents with altered mental status.  Pt has prior admission by me for DT's in 2014.  He was brought to the ED by a friend due to diminished responsiveness and seizure at home.  No family or friends present at this time.  Pt able to give limited history due to  lethargy.  He denies ever having had a seizure in his life, though he has had documented seizures previously.  Pt states he normally drinks 6-8 beers per day and hadn't been drinking as much today.  He denies chest pain,  dyspnea, nausea, vomiting, abdominal pain.  He reports normally having a tremor, but it is worse at the present moment.  He also reports to me that he saw a red ball bouncing around his house today, but knew that it wasn't actually there."  Present Symptoms: confusion       Prior Level of Function/Home Situation: Patient is a poor historian but reports that he lives with his friend. He says he is independent with ADLs. He uses a bike for communication and does not drive.    Home Environment: Private residence  One/Two Story Residence: One story  Living Alone: Yes  Support Systems: Friends \\ neighbors  Patient Expects to be Discharged to:: Apartment  Current DME Used/Available at Home: Cane, straight, Environmental consultant, rolling  OBJECTIVE/TREATMENT:   (In addition to Assessment/Re-Assessment sessions the following treatments were rendered)                                                  [facility]	[place] [facility]-PACTM "6 Clicks"                                                       Daily Activity Inpatient Short Form  How much help from another person does the patient currently need... Total A Lot A Little None   1.  Putting on and taking off regular lower body clothing?   _0  1   _1  2   _2  3   _3  4   2.  Bathing (including washing, rinsing, drying)?   _4  1   _5  2   _6  3   _7  4   3.  Toileting, which includes using toilet, bedpan or urinal?   _8  1   _9  2   _10  3   _11  4   4.  Putting on and taking off regular upper body clothing?   _12  1   _13  2   _14  3   _15  4   5.  Taking care of personal grooming such as brushing teeth?   _16  1   _17  2   _18  3   _19  4   6.  Eating meals?   _20  1   _21  2   _22  3   _23  4   ?? 2007, [employer], under license to [employer]. All rights reserved       Score:  Initial: 9 Most Recent: X (Date: -- )   Interpretation of Tool:  Represents clinically-significant functional categories (i.e.Activities of daily living).  Score 24 23 22-20 19-14 13-9 8-7 6    Modifier CH CI CJ CK CL CM CN       ?? Self Care:              [phone] - CURRENT STATUS:           CL -  60%-79% impaired, limited or restricted              [id number] - GOAL STATUS:                   CK - 40%-59% impaired, limited or restricted              O1308 - D/C STATUS:                       ---------------To be determined---------------  Payor: ADVICARE MEDICAID / Plan: [place]ICARE MEDICA[place] / Product Type: Managed Care Medicaid /      Self Care: (8 minutes): Procedure(s) (per grid) utilized to improve and/or restore self-care/home management as related to dressing, toileting and self feeding. Required maximal verbal and   cueing to facilitate activities of daily living skills.    Balance  Sitting: Impaired  Sitting - Static: Poor (constant support)  Sitting - Dynamic: Poor (constant support)  Standing: Impaired  Standing - Static: Poor  Standing - Dynamic : Poor       Patient Vitals for the past 6 hrs:    BP BP Patient Position SpO2 Pulse   02/26/15 1158 126/86 mmHg At rest 91 % 80       Gross Assessment: Yes  Gross Assessment  AROM: Generally decreased, functional (BUE)  Strength: Generally decreased, functional (BUE)  Coordination: Generally decreased, functional (BUE)                        Most Recent Physical Functioning:   Gross Assessment:  AROM: Generally decreased, functional (BUE)  Strength: Generally decreased, functional (BUE)  Coordination: Generally decreased, functional (BUE)               Posture:     Balance:  Sitting: Impaired  Sitting - Static: Poor (constant support)  Sitting - Dynamic: Poor (constant support)  Standing: Impaired  Standing - Static: Poor  Standing - Dynamic : Poor  Bed Mobility:  Rolling: Minimum assistance  Supine to Sit: Minimum assistance;Assist x2  Scooting: Minimum assistance  Wheelchair Mobility:     Transfers:  Sit to Stand: Moderate assistance;Assist x2  Stand to Sit: Moderate assistance;Assist x2                      Mental Status  Neurologic State: Alert;Confused   Orientation Level: Oriented to person;Disoriented to time;Disoriented to situation;Disoriented to place  Cognition: Decreased command following;Decreased attention/concentration;Poor safety awareness  Perception: Appears intact  Perseveration: Perseverates during conversation  Safety/Judgement: Fall prevention;Decreased awareness of need for safety;Decreased awareness of need for assistance;Decreased awareness of environment      Basic ADLs (From Assessment) Complex ADLs (From Assessment)   Basic ADL  Feeding: Moderate assistance  Oral Facial Hygiene/Grooming: Moderate assistance  Bathing: Maximum assistance  Upper Body Dressing: Maximum assistance  Lower Body Dressing: Maximum assistance  Toileting: Total assistance Instrumental ADL  Meal Preparation: Total assistance  Homemaking: Total assistance  Medication Management: Total assistance  Financial Management: Total assistance   Grooming/Bathing/Dressing Activities of Daily Living     Cognitive Retraining  Safety/Judgement: Fall prevention;Decreased awareness of need for safety;Decreased awareness of need for assistance;Decreased awareness of environment   Upper Body Bathing  Bathing Assistance: Maximum assistance Feeding  Food to Mouth: Minimum assistance  Drink to Mouth: Moderate assistance     Toileting  Toileting Assistance: Total assistance(dependent)  Clothing Management: Total assistance (dependent)  Bed/Mat Mobility  Rolling: Minimum assistance  Supine to Sit: Minimum assistance;Assist x2  Sit to Stand: Moderate assistance;Assist x2  Scooting: Minimum assistance          Braces/Orthotics/Lines/Other:   ?? Hand Dominance: unknown  ?? IV  ?? O2 Device: Nasal cannula    Safety:   After treatment position/precautions:  ?? Supine in bed  ?? Bed in low position  ?? Call light within reach  ?? RN notified  ?? Restraints    Progression/Medical Necessity:   ?? Patient demonstrates fair rehab potential due to higher previous functional level.   Compliance with Program/Exercises: noncompliant some of the time.   Reason for Continuation of Services/Other Comments:  ?? Patient continues to require present interventions due to patient's inability to care for self without assistance.  Recommendations/Intent for next treatment session: Treatment next visit will focus on advancements to more challenging activities and reduction in assistance provided.  Total Treatment Duration:  Time In: 1040  Time Out: [place]field Banks, OTR/L

## 2015-02-26 NOTE — Progress Notes (Signed)
Patient asleep in bed. No distress noted. Stable vitals.

## 2015-02-26 NOTE — Progress Notes (Addendum)
Beside report received from Rock[place]Paul, [place]. Patient resting in bed. Alert/confused. Oriented to self, disoriented to time,place, situation. Agitated and impulsive. VSS. NSR on monitor. O2 sat 100% on 2 L NC. Bilat lung sounds coarse. Strong, hacking cough. Skin intact. Tattoo to LUE. Abrasion/scabs scattered over body,  No signs or symptoms of distress. Voids to urinal. Will continue to monitor.

## 2015-02-26 NOTE — Progress Notes (Signed)
Blood pressures elevated and respirations 34. Patient's lungs noted to be coarse. Patient is very agitated at this moment. Dr. Kenard Gowerrew paged and aware. Up to floor to see patient. Instructed to continue with just the vitamin bag infusion or fluids but not to be infused simultaneously. New orders to be placed.

## 2015-02-26 NOTE — Progress Notes (Signed)
Patient discharged from room before vital signs could be validated [facility] and new patient admission into room [facility]305

## 2015-02-26 NOTE — Progress Notes (Signed)
Outreach RN called and informed of MEWS score of 4. Stated they will see patient. Updated on situation and medications. Will continue to monitor.

## 2015-02-26 NOTE — Progress Notes (Addendum)
HOSPITALIST Progress Note    NAME:  Drayce Tawil   Age:  54 y.o.  DOB:   03/25/1961   MRN:   [id number]  PCP: Sol Blazing, MD  Chief complaint: AMS    Subjective:     Patient is a 54 y.o. white  male who presents with altered mental status.  Pt has prior admission by me for DT's in 2014.  He was brought to the ED by a friend due to diminished responsiveness and seizure at home.  No family or friends present at this time.  Pt able to give limited history due to lethargy.  He denies ever having had a seizure in his life, though he has had documented seizures previously.  Pt states he normally drinks 6-8 beers per day and hadn't been drinking as much today.  He denies chest pain, dyspnea, nausea, vomiting, abdominal pain.  He reports normally having a tremor, but it is worse at the present moment.  He also reports to me that he saw a red ball bouncing around his house today, but knew that it wasn't actually there.    3/16 - Patient in bed with restraints, looks agitated, stating he wants to a knife to but the restraints. Does not threaten staff though, I have stressed that he needs to relax and rest. Mild tremors this am, CIWA protocol.      Past Medical History   Diagnosis Date   ??? 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  Found to have GI bleeding secondary to gastric ullcers.   ??? Gastric ulcer September, 2014     Found at EGD to have major gastric ulcers.       Past Surgical History   Procedure Laterality Date   ??? Pr neurological procedure unlisted     ??? Hx back surgery       x 2   ??? Hx endoscopy  September, 2014     Major gastric ulcer       No current facility-administered medications on file prior to encounter.     No current outpatient prescriptions on file prior to encounter.       Allergies   Allergen Reactions   ??? Codeine Rash       History   Substance Use Topics   ??? Smoking status: Current Every Day Smoker -- 1.00 packs/day for 40 years     Types: Cigarettes    ??? Smokeless tobacco: Never Used   ??? Alcohol Use: Yes       Family History   Problem Relation Age of Onset   ??? Heart Disease Mother      CHF       I have personally reviewed and reconciled patients history.    Review of Systems  A comprehensive 12 point review of systems is negative other than what is listed above.    Objective:     Patient Vitals for the past 24 hrs:   BP Temp Pulse Resp SpO2   02/26/15 0731 148/88 mmHg 97.6 ??F (36.4 ??C) 70 21 95 %   02/26/15 0100 - 97.6 ??F (36.4 ??C) - - -   02/26/15 0000 101/72 mmHg 97.9 ??F (36.6 ??C) 75 25 96 %   02/25/15 2300 127/86 mmHg - 80 24 97 %   02/25/15 2202 (!) 147/96 mmHg - (!) 105 26 (!) 88 %   02/25/15 2100 (!) 129/98 mmHg - 85 25 94 %   02/25/15  2000 129/86 mmHg - 86 23 97 %   02/25/15 1900 139/90 mmHg 97.4 ??F (36.3 ??C) 93 26 98 %   02/25/15 1728 (!) 133/91 mmHg - 99 (!) 32 93 %   02/25/15 1232 - 98.5 ??F (36.9 ??C) - - -   02/25/15 1231 142/88 mmHg - (!) 111 21 96 %   02/25/15 1159 (!) 161/100 mmHg - (!) 112 (!) 43 94 %   02/25/15 1129 (!) 152/100 mmHg - 95 (!) 48 97 %   02/25/15 1059 125/84 mmHg - (!) 108 26 96 %   02/25/15 1029 (!) 140/93 mmHg - 100 25 97 %   02/25/15 0959 132/90 mmHg - (!) 101 21 94 %   02/25/15 0929 126/81 mmHg - (!) 105 24 95 %   02/25/15 0859 (!) 141/105 mmHg - (!) 107 27 95 %       Exam:  General: awake, alert, no apparent distress  Eyes: PERRL, anicteric  Neck: Supple, trachea midline, no palpable lymphadenopathy  Lungs: Clear to auscultation bilaterally.  No rales, wheezes, or rhonchi  Heart: Regular rhythm and tachycardic.  No appreciable murmur.  Abdomen: Soft, nontender, nondistended.  Bowel sounds normal.  No rebound tenderness, guarding, or rigidity.  Extremities:  No LE edema.  Skin: Warm/dry. No rashes or lesions.  Neurologic: CN II-XII grossly intact bilaterally.  Bilateral UE tremors worse with movement.  No focal deficits.  Psych: AOx3.  Flat affect.      Data Review (Labs):   Recent Results (from the past 24 hour(s))    METABOLIC PANEL, BASIC    Collection Time: 02/26/15  6:35 AM   Result Value Ref Range    Sodium 134 (L) 136 - 145 mmol/L    Potassium 3.7 3.5 - 5.1 mmol/L    Chloride 100 98 - 107 mmol/L    CO2 22 21 - 32 mmol/L    Anion gap 12 7 - 16 mmol/L    Glucose 82 65 - 100 mg/dL    BUN 9 6 - 23 MG/DL    Creatinine 1.61 (L) 0.8 - 1.5 MG/DL    GFR est AA >09 >60 AV/WUJ/8.11B1    GFR est non-AA >60 >60 ml/min/1.26m2    Calcium 8.1 (L) 8.3 - 10.4 MG/DL   MAGNESIUM    Collection Time: 02/26/15  6:35 AM   Result Value Ref Range    Magnesium 1.6 (L) 1.8 - 2.4 mg/dL   CBC WITH AUTOMATED DIFF    Collection Time: 02/26/15  6:35 AM   Result Value Ref Range    WBC 6.9 4.3 - 11.1 K/uL    RBC 4.09 (L) 4.23 - 5.67 M/uL    HGB 14.4 13.6 - 17.2 g/dL    HCT 47.8 (L) 29.5 - 50.3 %    MCV 100.2 (H) 79.6 - 97.8 FL    MCH 35.2 (H) 26.1 - 32.9 PG    MCHC 35.1 (H) 31.4 - 35.0 g/dL    RDW 62.1 30.8 - 65.7 %    PLATELET 139 (L) 150 - 450 K/uL    MPV 10.5 (L) 10.8 - 14.1 FL    DF AUTOMATED      NEUTROPHILS 69 43 - 78 %    LYMPHOCYTES 19 13 - 44 %    MONOCYTES 10 4.0 - 12.0 %    EOSINOPHILS 2 0.5 - 7.8 %    BASOPHILS 0 0.0 - 2.0 %    IMMATURE GRANULOCYTES 0.1 0.0 - 5.0 %  ABS. NEUTROPHILS 4.7 1.7 - 8.2 K/UL    ABS. LYMPHOCYTES 1.3 0.5 - 4.6 K/UL    ABS. MONOCYTES 0.7 0.1 - 1.3 K/UL    ABS. EOSINOPHILS 0.1 0.0 - 0.8 K/UL    ABS. BASOPHILS 0.0 0.0 - 0.2 K/UL    ABS. IMM. GRANS. 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

## 2015-02-26 NOTE — Progress Notes (Signed)
MEWS score of 4, MD already aware.

## 2015-02-26 NOTE — Progress Notes (Signed)
Bedside report received from TrentKaty, [place]. Pt arrived from unit via wheelchair at 0325. He is agitated and confused upon arrival. Pt given ativan IV per PRN order at 0330 by Orpha BurKaty, RN and placed in wrist restraints. Pt yelling and screaming profanities at stuff. Will continue to monitor.

## 2015-02-26 NOTE — Progress Notes (Signed)
Problem: Mobility Impaired (Adult and Pediatric)  Goal: *Acute Goals and Plan of Care (Insert Text)  Discharge Goals:  (1.)[name] will move from supine to sit and sit to supine , scoot up and down and roll side to side with INDEPENDENT within 7 day(s).   (2.)[name] will transfer from bed to chair and chair to bed with MODIFIED INDEPENDENCE using the least restrictive device within 7 day(s).   (3.)[name] will ambulate with MODIFIED INDEPENDENCE for 150+ feet with the least restrictive device within 7 day(s).   (4.)[name] will tolerate 25+ minutes of therapeutic activity/exercise while maintaining stable vitals to improve functional strength and endurance within 7 day(s).  (5.)[name] will demonstrate good dynamic standing balance to safely perform functional mobility and ambulation within 7 day(s).  ________________________________________________________________________________________________  PHYSICAL THERAPY: INITIAL ASSESSMENT, TREATMENT DAY: DAY OF ASSESSMENT AND AM  INPATIENT: Medicaid : Hospital Day: 3    NAME/AGE/GENDER: Brett Becker is a 54 y.o. male  DATE: 02/26/2015  PRIMARY DIAGNOSIS: Delirium tremens ([place])  Delirium tremens ([place])  Delirium tremens ([place])        ICD-10: Treatment Diagnosis: Unsteadiness on feet; Unspecified lack of coordination   INTERDISCIPLINARY COLLABORATION: Physical Therapist, Occupational Therapist, Registered Nurse and Certified Nursing Assistant/Patient Care Technician  ASSESSMENT:   Mr. [name] is a 55 year old male admitted with delirium tremens and seen this AM for initial assessment. Patient presents supine in bed, oriented to person/birthday, and disoriented to time/place/situation. Mr. [name] lives alone in a single story residence. At baseline, patient is independent with ADLs and ambulates with a straight cane. Today, B LE ROM WDL (hamstring tightness appreciated bilaterally), B LE strength functionally 4-/5, and unable to perform formal sensory or coordination  assessment secondary to decreased command following/attention/concentration. Removed B UE restraints and patient performed bed mobility with minimal assistance and demonstrated poor static and dynamic sitting balance with strong posterior lean despite verbal/tactile/and manual cueing. Transitioned sit to stand and took 3 steps toward head of bed with moderate assistance x2. Patient demonstrated a slow, step-to gait pattern with decreased step length and step clearance appreciated bilaterally. Poor static and dynamic standing balance throughout with strong posterior lean. Required verbal/visual/manual cues for weight shifts, gait mechanics, and standing balance. Transitioned back into supine with minimal assistance and B UE restraints re-applied with RN at bedside. Mr. Narula presents with decreased functional safety, coordination, and balance/gait status from baseline. Will follow and progress toward stated goals during acute stay to optimize functional independence and safety.      ????????This section established at most recent assessment??????????  PROBLEM LIST (Impairments causing functional limitations):  1. Decreased Strength effecting function  2. Decreased ADL/Functional Activities  3. Decreased Transfer Abilities  4. Decreased Ambulation Ability/Technique  5. Decreased Balance  6. Decreased Activity Tolerance  7. Increased Fatigue effecting function  8. Increased Shortness of Breath affecting function  9. Decreased Knowledge of precautions  10. Decreased Independence with Home Exercise Program  REHABILITATION POTENTIAL FOR STATED GOALS: GOOD      PLAN OF CARE:   INTERVENTIONS PLANNED: (Benefits and precautions of physical therapy have been discussed with the patient.)  1. balance exercise  2. bed mobility  3. family education  4. gait training  5. neuromuscular re-education/strengthening  6. range of motion: active/assisted/passive  7. therapeutic activities  8. therapeutic exercise/strengthening   9. transfer training  FREQUENCY/DURATION: Follow patient 1-2 times per day/4-7 days per week until goals are met in order to address above goals.  RECOMMENDED REHABILITATION/EQUIPMENT: (at time of discharge pending progress):   TBD pending patient progress. Continue skilled acute PT services..  SUBJECTIVE:   "Brett Becker."    Present Symptoms: Endorses 0/10 pain. Oriented to person/birthday only.   Pain Intensity 1: 0     History of Present Injury/Illness: Per chart:  Patient is a 54 y.o. white  male who presents with altered mental status.  Pt has prior admission by me for DT's in 2014.  He was brought to the ED by a friend due to diminished responsiveness and seizure at home.  No family or friends present at this time.  Pt able to give limited history due to lethargy.  He denies ever having had a seizure in his life, though he has had documented seizures previously.  Pt states he normally drinks 6-8 beers per day and hadn't been drinking as much today.  He denies chest pain, dyspnea, nausea, vomiting, abdominal pain.  He reports normally having a tremor, but it is worse at the present moment.  He also reports to me that he saw a red ball bouncing around his house today, but knew that it wasn't actually there.      Past Medical History    Diagnosis  Date    ???  Delirium tremens ([name])  September, 2014        Admitted with DTs and seizure activity.  Found to have GI bleeding secondary to gastric ullcers.    ???  Gastric ulcer  September, 2014        Found at EGD to have major gastric ulcers.        Past Surgical History    Procedure  Laterality  Date    ???  Pr neurological procedure unlisted        ???  Hx back surgery            x 2  ???  Hx endoscopy    September, 2014              Prior Level of Function/Home Situation:   History provided by patient/chart; patient is a poor historian at time of initial assessment.  Patient live alone in a private residence/apartment.   At baseline, independent with ADLs and ambulates with a straight cane.  Home Environment: Private residence  One/Two Story Residence: One story  Living Alone: Yes  Support Systems: Friends \\ neighbors  Patient Expects to be Discharged to:: Apartment  Current DME Used/Available at Home: Cane, straight, Environmental consultant, rolling  OBJECTIVE/TREATMENT:   (In addition to Assessment/Re-Assessment sessions the following treatments were rendered)                                      [facility]??? ???6 Clicks???                                          Basic Mobility Inpatient Short Form  How much difficulty does the patient currently have... Unable A Lot A Little None   1.  Turning over in bed (including adjusting bedclothes, sheets and blankets)?   '[ ]'  1   '[ ]'  2   '[X]'  3   '[ ]'  4   2.  Sitting down on and standing up from a chair with arms ( e.g., wheelchair, bedside commode, etc.)   '[ ]'   1   '[X]'  2   '[ ]'  3   '[ ]'  4   3.  Moving from lying on back to sitting on the side of the bed?   '[ ]'  1   '[ ]'  2   '[X]'  3   '[ ]'  4               How much help from another person does the patient currently need... Total A Lot A Little None   4.  Moving to and from a bed to a chair (including a wheelchair)?   '[ ]'  1   '[X]'  2   '[ ]'  3   '[ ]'  4   5.  Need to walk in hospital room?   '[ ]'  1   '[X]'  2   '[ ]'  3   '[ ]'  4   6.  Climbing 3-5 steps with a railing?   '[ ]'  1   '[X]'  2   '[ ]'  3   '[ ]'  4   ?? 2007, [employer], under license to [employer]. All rights reserved       Score:  Initial: 14 Most Recent: 14 (Date: 02/25/14 )   Interpretation of Tool:  Represents activities that are increasingly more difficult (i.e. Bed mobility, Transfers, Gait).  Score 24 23 22-20 19-15 14-10 9-7 6   Modifier CH CI CJ CK CL CM CN       ?? Mobility - Walking and Moving Around:              E2800 - CURRENT STATUS:       CL - 60%-79% impaired, limited or restricted              L4917 - GOAL STATUS:               CJ - 20%-39% impaired, limited or restricted               H1505 - D/C STATUS:                    ---------------To be determined---------------  Payor: ADVICARE MEDICAID / Plan: [place]IC[place] MEDICA[place] / Product Type: Managed Care Medicaid /    Most Recent Physical Functioning:   Gross Assessment:  AROM: Within functional limits (B LE)  Strength: Generally decreased, functional (4-/5 B LE functionally)  Coordination: Generally decreased, functional               Posture:  Posture (WDL): Exceptions to WDL  Posture Assessment: Forward head, Rounded shoulders, Kyphosis  Balance:  Sitting: Impaired  Sitting - Static: Poor (constant support)  Sitting - Dynamic: Poor (constant support)  Standing: Impaired  Standing - Static: Poor  Standing - Dynamic : Poor  Bed Mobility:  Rolling: Minimum assistance  Supine to Sit: Minimum assistance  Scooting: Minimum assistance  Wheelchair Mobility:     Transfers:  Sit to Stand: Moderate assistance;Assist x2  Stand to Sit: Moderate assistance;Assist x2  Gait:     Base of Support: Narrowed;Center of gravity altered  Speed/Cadence: Shuffled;Slow  Step Length: Left shortened;Right shortened  Gait Abnormalities: Decreased step clearance;Shuffling gait;Step to gait;Trunk sway increased;Path deviations  Distance (ft): 3 Feet (ft)  Ambulation - Level of Assistance: Moderate assistance;Assist x2  Interventions: Safety awareness training;Tactile cues;Verbal cues      Initial Evaluation  Therapeutic Activity: (    8 Minutes):  Therapeutic activities including bed mobility, transfer training, balance training, safety awareness training, ambulation  on level ground x26f, and patient education to improve mobility, strength, balance and coordination.  Required minimal to moderate assistance with mobility as well as Safety awareness training;Tactile cues;Verbal cues to promote static and dynamic balance in standing and promote coordination of bilateral, lower extremity(s).     Braces/Orthotics/Lines/Etc:   ?? O2 Device: Nasal cannula  Safety:    After treatment position/precautions:  ?? Supine in bed  ?? Bed alarm/tab alert on  ?? Bed in low position  ?? Call light within reach  ?? RN notified  ?? Nurse at bedside  ?? Restraints  ?? Side rails x 3  Progression/Medical Necessity:   ?? Patient demonstrates good rehab potential due to higher previous functional level.  Compliance with Program/Exercises: Will assess as treatment progresses.   Reason for Continuation of Services/Other Comments:  ?? Patient continues to require skilled intervention due to decreased functional mobility, coordination, and balance/gait status from baseline..  Recommendations/Intent for next treatment session: Treatment next visit will focus on advancements to more challenging activities and reduction in assistance provided.    Total Treatment Duration:  Time In: 1040  Time Out: 1[place]

## 2015-02-26 NOTE — Progress Notes (Signed)
Patient attempting to climb out of bed, peripheral IV ripped out, oxygen taken off. Patient very agitated, yelling, grabbing RN's arms stating "I'm not in no hospital, leave me alone." Patient talked down, placed back in bed, new IV started in right forearm.

## 2015-02-27 MED ORDER — IPRATROPIUM-ALBUTEROL 2.5 MG-0.5 MG/3 ML NEB SOLUTION
2.5 mg-0.5 mg/3 ml | Freq: Four times a day (QID) | RESPIRATORY_TRACT | Status: DC | PRN
Start: 2015-02-27 — End: 2015-03-06

## 2015-02-27 MED FILL — IPRATROPIUM-ALBUTEROL 2.5 MG-0.5 MG/3 ML NEB SOLUTION: 2.5 mg-0.5 mg/3 ml | RESPIRATORY_TRACT | Qty: 3

## 2015-02-27 MED FILL — LORAZEPAM 2 MG/ML IJ SOLN: 2 mg/mL | INTRAMUSCULAR | Qty: 1

## 2015-02-27 MED FILL — NICOTINE 21 MG/24 HR DAILY PATCH: 21 mg/24 hr | TRANSDERMAL | Qty: 1

## 2015-02-27 MED FILL — SOLU-MEDROL (PF) 40 MG/ML SOLUTION FOR INJECTION: 40 mg/mL | INTRAMUSCULAR | Qty: 1

## 2015-02-27 MED FILL — DEXTROSE 5%-1/2 NORMAL SALINE IV: INTRAVENOUS | Qty: 1000

## 2015-02-27 MED FILL — MAGNESIUM OXIDE 400 MG TAB: 400 mg | ORAL | Qty: 1

## 2015-02-27 MED FILL — HALOPERIDOL LACTATE 5 MG/ML IJ SOLN: 5 mg/mL | INTRAMUSCULAR | Qty: 1

## 2015-02-27 MED FILL — CHLORDIAZEPOXIDE 25 MG CAP: 25 mg | ORAL | Qty: 1

## 2015-02-27 MED FILL — CLONIDINE 0.2 MG TAB: 0.2 mg | ORAL | Qty: 1

## 2015-02-27 MED FILL — LEVETIRACETAM 500 MG TAB: 500 mg | ORAL | Qty: 1

## 2015-02-27 MED FILL — LOVENOX 40 MG/0.4 ML SUBCUTANEOUS SYRINGE: 40 mg/0.4 mL | SUBCUTANEOUS | Qty: 0.4

## 2015-02-27 NOTE — Progress Notes (Deleted)
Patient states that he gets extremely jittery after every Albuterol nebulizer treatment. Will switch to xopenex to see if it alleviates his side effects. No other distress noted.

## 2015-02-27 NOTE — Progress Notes (Signed)
Patient has redness on right wrist where he has been resisting against restraints. Reddened area on wrist non blanchable. Will continue to monitor.

## 2015-02-27 NOTE — Progress Notes (Addendum)
HOSPITALIST Progress Note    NAME:  Brett Becker   Age:  54 y.o.  DOB:   1961/03/13   MRN:   [id number]  PCP: Sol BlazingPhys Other, MD  Chief complaint: AMS    Subjective:     Patient is a 54 y.o. white  male who presents with altered mental status.  Pt has prior admission by me for DT's in 2014.  He was brought to the ED by a friend due to diminished responsiveness and seizure at home.  No family or friends present at this time.  Pt able to give limited history due to lethargy.  He denies ever having had a seizure in his life, though he has had documented seizures previously.  Pt states he normally drinks 6-8 beers per day and hadn't been drinking as much today.  He denies chest pain, dyspnea, nausea, vomiting, abdominal pain.  He reports normally having a tremor, but it is worse at the present moment.  He also reports to me that he saw a red ball bouncing around his house today, but knew that it wasn't actually there.    3/16 - Patient in bed with restraints, looks agitated, stating he wants to a knife to but the restraints. Does not threaten staff though, I have stressed that he needs to relax and rest. Mild tremors this am, CIWA protocol.      3/17 - Resting comfortably this am, decrease in BL Wheezing on exam, Sats improved    Past Medical History   Diagnosis Date   ??? 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  Found to have GI bleeding secondary to gastric ullcers.   ??? Gastric ulcer September, 2014     Found at EGD to have major gastric ulcers.       Past Surgical History   Procedure Laterality Date   ??? Pr neurological procedure unlisted     ??? Hx back surgery       x 2   ??? Hx endoscopy  September, 2014     Major gastric ulcer       No current facility-administered medications on file prior to encounter.     No current outpatient prescriptions on file prior to encounter.       Allergies   Allergen Reactions   ??? Codeine Rash       History   Substance Use Topics    ??? Smoking status: Current Every Day Smoker -- 1.00 packs/day for 40 years     Types: Cigarettes   ??? Smokeless tobacco: Never Used   ??? Alcohol Use: Yes       Family History   Problem Relation Age of Onset   ??? Heart Disease Mother      CHF       I have personally reviewed and reconciled patients history.    Review of Systems  A comprehensive 12 point review of systems is negative other than what is listed above.    Objective:     Patient Vitals for the past 24 hrs:   BP Temp Pulse Resp SpO2   02/27/15 0812 - - - - 94 %   02/27/15 0702 129/69 mmHg 98 ??F (36.7 ??C) 79 22 98 %   02/27/15 0625 114/74 mmHg 97.5 ??F (36.4 ??C) 81 24 98 %   02/27/15 0028 - - - - 93 %   02/26/15 2201 (!) 149/100 mmHg - (!) 107 30 -   02/26/15  2154 (!) 155/102 mmHg - (!) 125 30 (!) 68 %   02/26/15 2152 (!) 157/99 mmHg 97.6 ??F (36.4 ??C) (!) 107 30 92 %   02/26/15 1957 - - - - 94 %   02/26/15 1647 (!) 155/96 mmHg 97.5 ??F (36.4 ??C) (!) 102 (!) 36 -   02/26/15 1514 (!) 178/107 mmHg 97.5 ??F (36.4 ??C) (!) 104 (!) 34 91 %   02/26/15 1158 126/86 mmHg 97.5 ??F (36.4 ??C) 80 21 91 %       Exam:  General: awake, alert, no apparent distress  Eyes: PERRL, anicteric  Neck: Supple, trachea midline, no palpable lymphadenopathy  Lungs: Clear to auscultation bilaterally.  No rales, wheezes, or rhonchi  Heart: Regular rhythm and tachycardic.  No appreciable murmur.  Abdomen: Soft, nontender, nondistended.  Bowel sounds normal.  No rebound tenderness, guarding, or rigidity.  Extremities:  No LE edema.  Skin: Warm/dry. No rashes or lesions.  Neurologic: CN II-XII grossly intact bilaterally.  Bilateral UE tremors worse with movement.  No focal deficits.  Psych: AOx3.  Flat affect.      Data Review (Labs):   No results found for this or any previous visit (from the past 24 hour(s)).    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

## 2015-02-28 ENCOUNTER — Inpatient Hospital Stay: Admit: 2015-02-28 | Payer: MEDICAID | Primary: Family Medicine

## 2015-02-28 LAB — CBC WITH AUTOMATED DIFF
ABS. BASOPHILS: 0 10*3/uL (ref 0.0–0.2)
ABS. EOSINOPHILS: 0 10*3/uL (ref 0.0–0.8)
ABS. IMM. GRANS.: 0 10*3/uL (ref 0.0–0.5)
ABS. LYMPHOCYTES: 0.5 10*3/uL (ref 0.5–4.6)
ABS. MONOCYTES: 0.7 10*3/uL (ref 0.1–1.3)
ABS. NEUTROPHILS: 7.7 10*3/uL (ref 1.7–8.2)
BASOPHILS: 0 % (ref 0.0–2.0)
EOSINOPHILS: 0 % — ABNORMAL LOW (ref 0.5–7.8)
HCT: 41 % — ABNORMAL LOW (ref 41.1–50.3)
HGB: 14.9 g/dL (ref 13.6–17.2)
IMMATURE GRANULOCYTES: 0.3 % (ref 0.0–5.0)
LYMPHOCYTES: 5 % — ABNORMAL LOW (ref 13–44)
MCH: 35.5 PG — ABNORMAL HIGH (ref 26.1–32.9)
MCHC: 36.3 g/dL — ABNORMAL HIGH (ref 31.4–35.0)
MCV: 97.6 FL (ref 79.6–97.8)
MONOCYTES: 8 % (ref 4.0–12.0)
MPV: 10.8 FL (ref 10.8–14.1)
NEUTROPHILS: 87 % — ABNORMAL HIGH (ref 43–78)
PLATELET: 201 10*3/uL (ref 150–450)
RBC: 4.2 M/uL — ABNORMAL LOW (ref 4.23–5.67)
RDW: 11.7 % — ABNORMAL LOW (ref 11.9–14.6)
WBC: 9 10*3/uL (ref 4.3–11.1)

## 2015-02-28 LAB — METABOLIC PANEL, COMPREHENSIVE
A-G Ratio: 0.6 — ABNORMAL LOW (ref 1.2–3.5)
ALT (SGPT): 97 U/L — ABNORMAL HIGH (ref 12–65)
AST (SGOT): 159 U/L — ABNORMAL HIGH (ref 15–37)
Albumin: 2.9 g/dL — ABNORMAL LOW (ref 3.5–5.0)
Alk. phosphatase: 62 U/L (ref 50–136)
Anion gap: 13 mmol/L (ref 7–16)
BUN: 16 MG/DL (ref 6–23)
Bilirubin, total: 1.1 MG/DL (ref 0.2–1.1)
CO2: 22 mmol/L (ref 21–32)
Calcium: 8 MG/DL — ABNORMAL LOW (ref 8.3–10.4)
Chloride: 99 mmol/L (ref 98–107)
Creatinine: 0.68 MG/DL — ABNORMAL LOW (ref 0.8–1.5)
GFR est AA: >60 mL/min/{1.73_m2} (ref >60)
GFR est non-AA: >60 mL/min/{1.73_m2} (ref >60)
Globulin: 4.9 g/dL — ABNORMAL HIGH (ref 2.3–3.5)
Glucose: 126 mg/dL — ABNORMAL HIGH (ref 65–100)
Potassium: 3.9 mmol/L (ref 3.5–5.1)
Protein, total: 7.8 g/dL (ref 6.3–8.2)
Sodium: 134 mmol/L — ABNORMAL LOW (ref 136–145)

## 2015-02-28 LAB — MAGNESIUM: Magnesium: 2.4 mg/dL (ref 1.8–2.4)

## 2015-02-28 LAB — AMMONIA: Ammonia, plasma: 25 umol/L (ref 11–32)

## 2015-02-28 MED ORDER — PREDNISONE 20 MG TAB
20 mg | Freq: Every day | ORAL | Status: DC
Start: 2015-02-28 — End: 2015-03-01
  Administered 2015-03-01: 12:00:00 via ORAL

## 2015-02-28 MED FILL — NICOTINE 21 MG/24 HR DAILY PATCH: 21 mg/24 hr | TRANSDERMAL | Qty: 1

## 2015-02-28 MED FILL — VITAMIN B-1 100 MG TABLET: 100 mg | ORAL | Qty: 1

## 2015-02-28 MED FILL — CLONIDINE 0.2 MG TAB: 0.2 mg | ORAL | Qty: 1

## 2015-02-28 MED FILL — STRESS FORMULA WITH ZINC TABLET: ORAL | Qty: 1

## 2015-02-28 MED FILL — LEVETIRACETAM 500 MG TAB: 500 mg | ORAL | Qty: 1

## 2015-02-28 MED FILL — SOLU-MEDROL (PF) 40 MG/ML SOLUTION FOR INJECTION: 40 mg/mL | INTRAMUSCULAR | Qty: 1

## 2015-02-28 MED FILL — CHLORDIAZEPOXIDE 25 MG CAP: 25 mg | ORAL | Qty: 1

## 2015-02-28 MED FILL — LOVENOX 40 MG/0.4 ML SUBCUTANEOUS SYRINGE: 40 mg/0.4 mL | SUBCUTANEOUS | Qty: 0.4

## 2015-02-28 MED FILL — LORAZEPAM 2 MG/ML IJ SOLN: 2 mg/mL | INTRAMUSCULAR | Qty: 1

## 2015-02-28 MED FILL — FOLIC ACID 1 MG TAB: 1 mg | ORAL | Qty: 1

## 2015-02-28 MED FILL — MAGNESIUM OXIDE 400 MG TAB: 400 mg | ORAL | Qty: 1

## 2015-02-28 NOTE — Other (Signed)
Interdisciplinary team rounds were held 02/28/2015 with the following team members:Care Management, Nursing, Pastoral Care and Physician    Plan of care discussed. See clinical pathway and/or care plan for interventions and desired outcomes.    Pt walked with PT today, remains confused, CT scan ordered, continues on Librium and Ativan.

## 2015-02-28 NOTE — Progress Notes (Signed)
Problem: Mobility Impaired (Adult and Pediatric)  Goal: *Acute Goals and Plan of Care (Insert Text)  Discharge Goals:  (1.)Brett Becker will move from supine to sit and sit to supine , scoot up and down and roll side to side with INDEPENDENT within 7 day(s).   (2.)Brett Becker will transfer from bed to chair and chair to bed with MODIFIED INDEPENDENCE using the least restrictive device within 7 day(s).   (3.)Brett Becker will ambulate with MODIFIED INDEPENDENCE for 150+ feet with the least restrictive device within 7 day(s).   (4.)Brett Becker will tolerate 25+ minutes of therapeutic activity/exercise while maintaining stable vitals to improve functional strength and endurance within 7 day(s).  (5.)Brett Becker will demonstrate good dynamic standing balance to safely perform functional mobility and ambulation within 7 day(s).  ________________________________________________________________________________________________  PHYSICAL THERAPY: Daily Note, Treatment Day: 1st and AM  INPATIENT: Medicaid : Hospital Day: 5    NAME/AGE/GENDER: Brett Becker is a 54 y.o. male  DATE: 02/28/2015  PRIMARY DIAGNOSIS: Delirium tremens ([place])  Delirium tremens ([place])  Delirium tremens ([place])        ICD-10: Treatment Diagnosis: Unsteadiness on feet; Unspecified lack of coordination   INTERDISCIPLINARY COLLABORATION: Physical Therapy Assistant, Certified Occupational Therapy Assistant and Registered Nurse  ASSESSMENT:   Brett Becker is supine in bed, eyes closed in wrist restraints.  He was slow to respond and difficult to understand due to secretions in his throat.  We sat him up on the EOB with total assist and until we got his bottom on the edge of the bed he required a strong push forward to maintain sitting balance.  Once he was situated right on the edge his balance was good.  He stood with moderate assist and with HHA x2 and assistance to move forward he was able to walk around the bed to the other side.  His gait was poor,  festering with very short steps but he did it.  We left him in modified chair position back in restraints.  Progress made.      ????????This section established at most recent assessment??????????  PROBLEM LIST (Impairments causing functional limitations):  1. Decreased Strength effecting function  2. Decreased ADL/Functional Activities  3. Decreased Transfer Abilities  4. Decreased Ambulation Ability/Technique  5. Decreased Balance  6. Decreased Activity Tolerance  7. Increased Fatigue effecting function  8. Increased Shortness of Breath affecting function  9. Decreased Knowledge of precautions  10. Decreased Independence with Home Exercise Program  REHABILITATION POTENTIAL FOR STATED GOALS: GOOD      PLAN OF CARE:   INTERVENTIONS PLANNED: (Benefits and precautions of physical therapy have been discussed with the patient.)  1. balance exercise  2. bed mobility  3. family education  4. gait training  5. neuromuscular re-education/strengthening  6. range of motion: active/assisted/passive  7. therapeutic activities  8. therapeutic exercise/strengthening  9. transfer training  FREQUENCY/DURATION: Follow patient 1-2 times per day/4-7 days per week until goals are met in order to address above goals.    RECOMMENDED REHABILITATION/EQUIPMENT: (at time of discharge pending progress):   TBD pending patient progress. Continue skilled acute PT services..  SUBJECTIVE:   "I'm not a morning person"    Present Symptoms:no complaints   Pain Intensity 1: 0     History of Present Injury/Illness: Per chart:  Patient is a 54 y.o. white  male who presents with altered mental status.  Pt has prior admission by me for DT's in 2014.  He was brought to the ED by  a friend due to diminished responsiveness and seizure at home.  No family or friends present at this time.  Pt able to give limited history due to lethargy.  He denies ever having had a seizure in his life, though  he has had documented seizures previously.  Pt states he normally drinks 6-8 beers per day and hadn't been drinking as much today.  He denies chest pain, dyspnea, nausea, vomiting, abdominal pain.  He reports normally having a tremor, but it is worse at the present moment.  He also reports to me that he saw a red ball bouncing around his house today, but knew that it wasn't actually there.      Past Medical History    Diagnosis  Date    ???  Delirium tremens ([place])  September, 2014        Admitted with DTs and seizure activity.  Found to have GI bleeding secondary to gastric ullcers.    ???  Gastric ulcer  September, 2014        Found at EGD to have major gastric ulcers.          Past Surgical History    Procedure  Laterality  Date    ???  Pr neurological procedure unlisted        ???  Hx back surgery            x 2    ???  Hx endoscopy    September, 2014              Prior Level of Function/Home Situation:   History provided by patient/chart; patient is a poor historian at time of initial assessment.  Patient live alone in a private residence/apartment.  At baseline, independent with ADLs and ambulates with a straight cane.  Home Environment: Private residence  One/Two Story Residence: One story  Living Alone: Yes  Support Systems: Friends _0  neighbors  Patient Expects to be Discharged to:: Apartment  Current DME Used/Available at Home: Cane, straight, Environmental consultant, rolling  OBJECTIVE/TREATMENT:   (In addition to Assessment/Re-Assessment sessions the following treatments were rendered)                                      [facility]??? ???6 Clicks???                                          Basic Mobility Inpatient Short Form  How much difficulty does the patient currently have... Unable A Lot A Little None   1.  Turning over in bed (including adjusting bedclothes, sheets and blankets)?   [ ] 1   [ ] 2   [X] 3   [ ] 4   2.  Sitting down on and standing up from a chair with arms ( e.g.,  wheelchair, bedside commode, etc.)   [ ] 1   [X] 2   [ ] 3   [ ] 4   3.  Moving from lying on back to sitting on the side of the bed?   [ ] 1   [ ] 2   [X] 3   [ ] 4               How much help from another person does the patient currently need... Total A  Lot A Little None   4.  Moving to and from a bed to a chair (including a wheelchair)?   [ ] 1   [X] 2   [ ] 3   [ ] 4   5.  Need to walk in hospital room?   [ ] 1   [X] 2   [ ] 3   [ ] 4   6.  Climbing 3-5 steps with a railing?   [ ] 1   [X] 2   [ ] 3   [ ] 4   ?? 2007, [employer], under license to [employer]. All rights reserved       Score:  Initial: 14 Most Recent: 14 (Date: 02/25/14 )   Interpretation of Tool:  Represents activities that are increasingly more difficult (i.e. Bed mobility, Transfers, Gait).  Score 24 23 22-20 19-15 14-10 9-7 6   Modifier CH CI CJ CK CL CM CN       ?? Mobility - Walking and Moving Around:              [id number] - CURRENT STATUS:       CL - 60%-79% impaired, limited or restricted              V2919 - GOAL STATUS:               CJ - 20%-39% impaired, limited or restricted              T6606 - D/C STATUS:                    ---------------To be determined---------------  Payor: ADVICARE MEDICAID / Plan: [place]VIC[place] MEDICAID / Product Type: Managed Care Medicaid /    Most Recent Physical Functioning:   Gross Assessment:                  Posture:  Posture (WDL): Exceptions to WDL  Posture Assessment: Forward head, Rounded shoulders, Kyphosis  Balance:  Standing - Static: Fair;Poor  Standing - Dynamic : Poor  Bed Mobility:  Supine to Sit: Maximum assistance;Assist x2  Sit to Supine: Moderate assistance  Wheelchair Mobility:     Transfers:  Sit to Stand: Moderate assistance  Gait:     Base of Support: Widened  Speed/Cadence: Shuffled;Slow  Step Length: Left shortened;Right shortened  Gait Abnormalities: Decreased step clearance;Festinating gait;Shuffling gait;Steppage gait  Distance (ft): 10 Feet (ft) (around bed)   Assistive Device:  (HHA x2)  Ambulation - Level of Assistance: Minimal assistance;Moderate assistance;Assist x2      Therapeutic Activity: (    15 minutes):  Therapeutic activities including Bed transfers and sitting balance to improve mobility, strength and balance.  Required moderate   to promote static balance in sitting at times but if situated just right he maintains balance fine.  He cannot correct  Himself.    Gait Training (  10 minutes):  Gait training to improve and/or restore physical functioning as related to mobility, strength, balance and coordination.  Ambulated 10 Feet (ft) (around bed) with Minimal assistance;Moderate assistance;Assist x2 using a  (HHA x2) and moderate   related to their stride length to promote proper body alignment, promote proper body posture and promote proper body mechanics.       Braces/Orthotics/Lines/Etc:   ?? IV  Safety:   After treatment position/precautions:  ?? Supine in bed, Bed in low position, Call light within reach, RN notified, Restraints and Side rails  x 3  Progression/Medical Necessity:   ?? Patient demonstrates good rehab potential due to higher previous functional level.  Compliance with Program/Exercises: Will assess as treatment progresses.   Reason for Continuation of Services/Other Comments:  ?? Patient continues to require skilled intervention due to decreased functional mobility, coordination, and balance/gait status from baseline..  Recommendations/Intent for next treatment session: Treatment next visit will focus on advancements to more challenging activities and reduction in assistance provided.    Total Treatment Duration:  Time In: 7026  Time Out: [address], PTA

## 2015-02-28 NOTE — Progress Notes (Addendum)
Sat patient upright, HOB up 90 degree to give medicines, patient alert, oriented to person, denies any pain at this time, request water.  Meds given in apple sauce with no problems, sip of water given and patient starting coughing a lot.  Will make patient NPO until speech can evaluate patient.

## 2015-02-28 NOTE — Progress Notes (Addendum)
HOSPITALIST Progress Note    NAME:  Brett Becker   Age:  54 y.o.  DOB:   1961/07/04   MRN:   [id number]  PCP: Lacey Jensen, MD  Chief complaint: AMS    Subjective:     Patient is a 54 y.o. white  male who presents with altered mental status.  Pt has prior admission by me for DT's in 2014.  He was brought to the ED by a friend due to diminished responsiveness and seizure at home.  No family or friends present at this time.  Pt able to give limited history due to lethargy.  He denies ever having had a seizure in his life, though he has had documented seizures previously.  Pt states he normally drinks 6-8 beers per day and hadn't been drinking as much today.  He denies chest pain, dyspnea, nausea, vomiting, abdominal pain.  He reports normally having a tremor, but it is worse at the present moment.  He also reports to me that he saw a red ball bouncing around his house today, but knew that it wasn't actually there.    3/16 - Patient in bed with restraints, looks agitated, stating he wants to a knife to but the restraints. Does not threaten staff though, I have stressed that he needs to relax and rest. Mild tremors this am, CIWA protocol.      3/17 - Resting comfortably this am, decrease in BL Wheezing on exam, Sats improved    3/18 - Patient more confused as prior days I don't appreciate any focal deficits at this time, will obtain a CT brain w/o contrast, and ammonia levels. Continue CIWA protocol, LFTs trending down.     Past Medical History   Diagnosis Date   ??? Delirium tremens ([place]) September, 2014     Admitted with DTs and seizure activity.  Found to have GI bleeding secondary to gastric ullcers.   ??? Gastric ulcer September, 2014     Found at EGD to have major gastric ulcers.       Past Surgical History   Procedure Laterality Date   ??? Pr neurological procedure unlisted     ??? Hx back surgery       x 2   ??? Hx endoscopy  September, 2014     Major gastric ulcer        No current facility-administered medications on file prior to encounter.     No current outpatient prescriptions on file prior to encounter.       Allergies   Allergen Reactions   ??? Codeine Rash       History   Substance Use Topics   ??? Smoking status: Current Every Day Smoker -- 1.00 packs/day for 40 years     Types: Cigarettes   ??? Smokeless tobacco: Never Used   ??? Alcohol Use: Yes       Family History   Problem Relation Age of Onset   ??? Heart Disease Mother      CHF       I have personally reviewed and reconciled patients history.    Review of Systems  A comprehensive 12 point review of systems is negative other than what is listed above.    Objective:     Patient Vitals for the past 24 hrs:   BP Temp Pulse Resp SpO2   02/28/15 0652 146/88 mmHg 98 ??F (36.7 ??C) 78 19 96 %   02/28/15 0343 153/90 mmHg 97.4 ??F (36.3 ??C) 83 20  92 %   02/27/15 2239 139/80 mmHg 97.7 ??F (36.5 ??C) 81 20 92 %   02/27/15 2054 142/87 mmHg 97.5 ??F (36.4 ??C) 87 18 91 %   02/27/15 1633 155/84 mmHg 98 ??F (36.7 ??C) 100 22 97 %   02/27/15 1233 156/72 mmHg 98 ??F (36.7 ??C) (!) 110 24 95 %   02/27/15 0812 - - - - 94 %       Exam:  General: awake, alert, no apparent distress  Eyes: PERRL, anicteric  Neck: Supple, trachea midline, no palpable lymphadenopathy  Lungs: Clear to auscultation bilaterally.  No rales, wheezes, or rhonchi  Heart: Regular rhythm and tachycardic.  No appreciable murmur.  Abdomen: Soft, nontender, nondistended.  Bowel sounds normal.  No rebound tenderness, guarding, or rigidity.  Extremities:  No LE edema.  Skin: Warm/dry. No rashes or lesions.  Neurologic: CN II-XII grossly intact bilaterally.  Bilateral UE tremors worse with movement.  No focal deficits.  Psych: AOx3.  Flat affect.      Data Review (Labs):   Recent Results (from the past 24 hour(s))   CBC WITH AUTOMATED DIFF    Collection Time: 02/28/15  5:16 AM   Result Value Ref Range    WBC 9.0 4.3 - 11.1 K/uL    RBC 4.20 (L) 4.23 - 5.67 M/uL    HGB 14.9 13.6 - 17.2 g/dL     HCT 41.0 (L) 41.1 - 50.3 %    MCV 97.6 79.6 - 97.8 FL    MCH 35.5 (H) 26.1 - 32.9 PG    MCHC 36.3 (H) 31.4 - 35.0 g/dL    RDW 11.7 (L) 11.9 - 14.6 %    PLATELET 201 150 - 450 K/uL    MPV 10.8 10.8 - 14.1 FL    DF AUTOMATED      NEUTROPHILS 87 (H) 43 - 78 %    LYMPHOCYTES 5 (L) 13 - 44 %    MONOCYTES 8 4.0 - 12.0 %    EOSINOPHILS 0 (L) 0.5 - 7.8 %    BASOPHILS 0 0.0 - 2.0 %    IMMATURE GRANULOCYTES 0.3 0.0 - 5.0 %    ABS. NEUTROPHILS 7.7 1.7 - 8.2 K/UL    ABS. LYMPHOCYTES 0.5 0.5 - 4.6 K/UL    ABS. MONOCYTES 0.7 0.1 - 1.3 K/UL    ABS. EOSINOPHILS 0.0 0.0 - 0.8 K/UL    ABS. BASOPHILS 0.0 0.0 - 0.2 K/UL    ABS. IMM. GRANS. 0.0 0.0 - 0.5 K/UL   METABOLIC PANEL, COMPREHENSIVE    Collection Time: 02/28/15  5:16 AM   Result Value Ref Range    Sodium 134 (L) 136 - 145 mmol/L    Potassium 3.9 3.5 - 5.1 mmol/L    Chloride 99 98 - 107 mmol/L    CO2 22 21 - 32 mmol/L    Anion gap 13 7 - 16 mmol/L    Glucose 126 (H) 65 - 100 mg/dL    BUN 16 6 - 23 MG/DL    Creatinine 0.68 (L) 0.8 - 1.5 MG/DL    GFR est AA >60 >60 ml/min/1.34m    GFR est non-AA >60 >60 ml/min/1.770m   Calcium 8.0 (L) 8.3 - 10.4 MG/DL    Bilirubin, total 1.1 0.2 - 1.1 MG/DL    ALT 97 (H) 12 - 65 U/L    AST 159 (H) 15 - 37 U/L    Alk. 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

## 2015-02-28 NOTE — Progress Notes (Signed)
Problem: Self Care Deficits Care Plan (Adult)  Goal: *Acute Goals and Plan of Care (Insert Text)  1. Patient will feed self entire meal with set-up.   2. Patient will complete grooming with set-up.   3. Patient will tolerate 15 minutes of OT treatment with no rest breaks to increase activity tolerance for ADLs.   4. Patient will complete functional transfers with minimal assistance and adaptive equipment as needed.   5. Patient will demonstrate fair standing balance to increase safety during standing ADLs.     Timeframe: 7 visits   OCCUPATIONAL THERAPY: Daily Note, Treatment Day: 2nd and AM  INPATIENT: Medicaid : Hospital Day: 5    NAME/AGE/GENDER: Brett Becker is a 54 y.o. male  DATE: 02/28/2015  PRIMARY DIAGNOSIS: Delirium tremens ([place])  Delirium tremens ([place])  Delirium tremens ([place])        ICD-10: Treatment Diagnosis:   Unspecified lack of coordination (R27.9)  Dizziness and giddiness (R42)  Muscle weakness (generalized) (M62.81)      INTERDISCIPLINARY COLLABORATION: Physical Therapy Assistant, Certified Occupational Therapy Assistant and Registered Nurse  ASSESSMENT:   Brett Becker was admitted for the above diagnosis. Pt presents supine upon arrival with restraints in place. Pt sat at edge of bed and performed grooming task with min assist at times and cueing. Pt displays decreased attention and concentration at this time requiring cueing for initiation of task. Pt was able to follow commands about 50% of the session. Pt dependent for donning socks. Pt oriented to person and place at this time. Pt hard to understand due to gargled speech. Returned back to bed with restraints. Will continue to benefit from skilled OT during stay.    ????????This section established at most recent assessment??????????  PROBLEM LIST (Impairments causing functional limitations):  1. Decreased Strength effecting function  2. Decreased ADL/Functional Activities  3. Decreased Transfer Abilities  4. Decreased Ambulation Ability/Technique   5. Decreased Balance  6. Decreased Activity Tolerance  7. Impaired balance affecting function  8. Impaired coordination affecting function  REHABILITATION POTENTIAL FOR STATED GOALS: GUARDED    INTERVENTIONS PLANNED: (Benefits and precautions of occupational therapy have been discussed with the patient.)  1. Activities of daily living training  2. Adaptive equipment training  3. Cognitive training  4. Donning&doffing training  5. Group therapy  6. Theraputic activity  7. Theraputic exercise  FREQUENCY/DURATION: Follow patient 1-2 times per day/3-5 days per week until goals are met in order to address above goals.    RECOMMENDED REHABILITATION/EQUIPMENT: (at time of discharge pending progress):   Continue OT.  SUBJECTIVE:   "They call me Brett Becker"    History of Present Injury/Illness: Per H&P, "Patient is a 54 y.o. white  male who presents with altered mental status.  Pt has prior admission by me for DT's in 2014.  He was brought to the ED by a friend due to diminished responsiveness and seizure at home.  No family or friends present at this time.  Pt able to give limited history due to lethargy.  He denies ever having had a seizure in his life, though he has had documented seizures previously.  Pt states he normally drinks 6-8 beers per day and hadn't been drinking as much today.  He denies chest pain, dyspnea, nausea, vomiting, abdominal pain.  He reports normally having a tremor, but it is worse at the present moment.  He also reports to me that he saw a red ball bouncing around his house today, but knew that it wasn't actually  there."  Present Symptoms: confusion       Prior Level of Function/Home Situation: Patient is a poor historian but reports that he lives with his friend. He says he is independent with ADLs. He uses a bike for communication and does not drive.    Home Environment: Private residence  One/Two Story Residence: One story  Living Alone: Yes  Support Systems: Friends \\ neighbors   Patient Expects to be Discharged to:: Apartment  Current DME Used/Available at Home: Cane, straight, Environmental consultant, rolling  OBJECTIVE/TREATMENT:   Self Care: (10 minutes): Procedure(s) (per grid) utilized to improve and/or restore self-care/home management as related to dressing and grooming. Required moderate verbal, manual and tactile cueing to facilitate activities of daily living skills.    Cognitive Skills Development: (15 minutes): Procedure(s) (per grid) utilized to improve and/or restore cognitive functioning as related to ability to follow simple commands, attention to tasks and self awareness. Required minimal verbal cueing to facilitate improve recall of information.                                                      [facility]	[place] [facility] "6 Clicks"                                                       Daily Activity Inpatient Short Form  How much help from another person does the patient currently need... Total A Lot A Little None   1.  Putting on and taking off regular lower body clothing?   '[X]'  1   '[ ]'  2   '[ ]'  3   '[ ]'  4   2.  Bathing (including washing, rinsing, drying)?   '[X]'  1   '[ ]'  2   '[ ]'  3   '[ ]'  4   3.  Toileting, which includes using toilet, bedpan or urinal?   '[X]'  1   '[ ]'  2   '[ ]'  3   '[ ]'  4   4.  Putting on and taking off regular upper body clothing?   '[ ]'  1   '[X]'  2   '[ ]'  3   '[ ]'  4   5.  Taking care of personal grooming such as brushing teeth?   '[ ]'  1   '[X]'  2   '[ ]'  3   '[ ]'  4   6.  Eating meals?   '[ ]'  1   '[X]'  2   '[ ]'  3   '[ ]'  4   ?? 2007, [employer], under license to [employer]. All rights reserved       Score:  Initial: 9 Most Recent: X (Date: -- )   Interpretation of Tool:  Represents clinically-significant functional categories (i.e.Activities of daily living).  Score 24 23 22-20 19-14 13-9 8-7 6   Modifier CH CI CJ CK CL CM CN       ?? Self Care:              [phone] - CURRENT STATUS:           CL - 60%-79% impaired, limited or restricted  [id number] - GOAL STATUS:                   CK - 40%-59% impaired, limited or restricted              [id number] - D/C STATUS:                       ---------------To be determined---------------  Payor: ADVICARE MEDICAID / Plan: [place] / Product Type: Managed Care Medicaid /      Balance  Sitting: Impaired  Sitting - Static: Fair (occasional)  Sitting - Dynamic: Fair (occasional)  Standing: Impaired  Standing - Static: Fair;Poor  Standing - Dynamic : Poor       Patient Vitals for the past 6 hrs:   BP BP Patient Position SpO2 Pulse   02/28/15 1057 153/82 mmHg At rest 94 % (!) 104                           Most Recent Physical Functioning:   Gross Assessment:  AROM: Generally decreased, functional (BUE)  Strength: Generally decreased, functional (BUE)  Coordination: Generally decreased, functional (BUE)               Posture:     Balance:  Sitting: Impaired  Sitting - Static: Fair (occasional)  Sitting - Dynamic: Fair (occasional)  Standing: Impaired  Standing - Static: Fair;Poor  Standing - Dynamic : Poor  Bed Mobility:  Supine to Sit: Maximum assistance;Assist x2  Sit to Supine: Moderate assistance  Wheelchair Mobility:     Transfers:  Sit to Stand: Moderate assistance     Mental Status  Neurologic State: Alert  Orientation Level: Oriented to person;Oriented to place  Cognition: Decreased attention/concentration;Decreased command following  Perception: Appears intact  Perseveration: No perseveration noted  Safety/Judgement: Decreased awareness of environment;Fall prevention      Basic ADLs (From Assessment) Complex ADLs (From Assessment)   Basic ADL  Feeding: Moderate assistance  Oral Facial Hygiene/Grooming: Moderate assistance  Bathing: Maximum assistance  Upper Body Dressing: Maximum assistance  Lower Body Dressing: Maximum assistance  Toileting: Total assistance Instrumental ADL  Meal Preparation: Total assistance  Homemaking: Total assistance  Medication Management: Total assistance   Financial Management: Total assistance   Grooming/Bathing/Dressing Activities of Daily Living   Grooming  Washing Face: Minimum assistance Cognitive Retraining  Orientation Retraining: Awareness of environment;Person;Place  Attention to Task: Single task  Maintains Attention For (Time): 2 minutes  Following Commands: Follows one step commands/directions  Safety/Judgement: Decreased awareness of environment;Fall prevention                     Lower Body Dressing Assistance  Socks: Total assistance (dependent) Bed/Mat Mobility  Supine to Sit: Maximum assistance;Assist x2  Sit to Supine: Moderate assistance  Sit to Stand: Moderate assistance          Braces/Orthotics/Lines/Other:   ?? Hand Dominance: unknown  ?? IV  ?? O2 Device: Nasal cannula    Safety:   After treatment position/precautions:  ?? Supine in bed, Bed/Chair-wheels locked, Bed in low position, Call light within reach and Restraints    Progression/Medical Necessity:   ?? Patient demonstrates fair rehab potential due to higher previous functional level.  Compliance with Program/Exercises: noncompliant some of the time.   Reason for Continuation of Services/Other Comments:  ?? Patient continues to require present interventions due to patient's inability to care for self without assistance.  Recommendations/Intent for next treatment session: Treatment next visit will focus on advancements to more challenging activities and reduction in assistance provided.  Total Treatment Duration:  Time In: 0955  Time Out: [place] Shark, COTA

## 2015-03-01 MED ORDER — FAT EMULSION 20 % IV EMUL
20 % | Freq: Every evening | INTRAVENOUS | Status: DC
Start: 2015-03-01 — End: 2015-03-02
  Administered 2015-03-01: 21:00:00 via INTRAVENOUS

## 2015-03-01 MED ORDER — AMINO ACID 4.25% DEXTROSE 5% INFUSION
4.25 % | Freq: Every evening | INTRAVENOUS | Status: DC
Start: 2015-03-01 — End: 2015-03-01

## 2015-03-01 MED ORDER — POTASSIUM CHLORIDE 2 MEQ/ML IV SOLN
2 mEq/mL | INTRAVENOUS | Status: DC
Start: 2015-03-01 — End: 2015-03-01
  Administered 2015-03-01: 18:00:00 via INTRAVENOUS

## 2015-03-01 MED ORDER — METHYLPREDNISOLONE (PF) 125 MG/2 ML IJ SOLR
125 mg/2 mL | Freq: Three times a day (TID) | INTRAMUSCULAR | Status: DC
Start: 2015-03-01 — End: 2015-03-01
  Administered 2015-03-01: 14:00:00 via INTRAVENOUS

## 2015-03-01 MED ORDER — NUTRITIONAL PHARMACY SUPPORT ORDERS
Status: DC | PRN
Start: 2015-03-01 — End: 2015-03-17

## 2015-03-01 MED ORDER — CHLORDIAZEPOXIDE 10 MG CAP
10 mg | Freq: Three times a day (TID) | ORAL | Status: DC
Start: 2015-03-01 — End: 2015-03-17
  Administered 2015-03-01 – 2015-03-17 (×33): via ORAL

## 2015-03-01 MED ORDER — SODIUM CHLORIDE 4 MEQ/ML IV
4 mEq/mL | Freq: Every evening | INTRAVENOUS | Status: DC
Start: 2015-03-01 — End: 2015-03-02
  Administered 2015-03-01: 21:00:00 via INTRAVENOUS

## 2015-03-01 MED ORDER — LORAZEPAM 2 MG/ML IJ SOLN
2 mg/mL | INTRAMUSCULAR | Status: DC | PRN
Start: 2015-03-01 — End: 2015-03-01

## 2015-03-01 MED ORDER — SODIUM CHLORIDE 0.9 % IV
500 mg/5 mL | Freq: Two times a day (BID) | INTRAVENOUS | Status: DC
Start: 2015-03-01 — End: 2015-03-13
  Administered 2015-03-02 – 2015-03-13 (×24): via INTRAVENOUS

## 2015-03-01 MED ORDER — IPRATROPIUM-ALBUTEROL 2.5 MG-0.5 MG/3 ML NEB SOLUTION
2.5 mg-0.5 mg/3 ml | Freq: Four times a day (QID) | RESPIRATORY_TRACT | Status: DC
Start: 2015-03-01 — End: 2015-03-03
  Administered 2015-03-01 – 2015-03-03 (×7): via RESPIRATORY_TRACT

## 2015-03-01 MED ORDER — METHYLPREDNISOLONE (PF) 125 MG/2 ML IJ SOLR
125 mg/2 mL | Freq: Three times a day (TID) | INTRAMUSCULAR | Status: DC
Start: 2015-03-01 — End: 2015-03-02
  Administered 2015-03-01 – 2015-03-02 (×3): via INTRAVENOUS

## 2015-03-01 MED ORDER — FAT EMULSION 20 % IV EMUL
20 % | INTRAVENOUS | Status: DC
Start: 2015-03-01 — End: 2015-03-01

## 2015-03-01 MED FILL — IPRATROPIUM-ALBUTEROL 2.5 MG-0.5 MG/3 ML NEB SOLUTION: 2.5 mg-0.5 mg/3 ml | RESPIRATORY_TRACT | Qty: 3

## 2015-03-01 MED FILL — LEVETIRACETAM 500 MG/5 ML IV SOLN: 500 mg/5 mL | INTRAVENOUS | Qty: 5

## 2015-03-01 MED FILL — PREDNISONE 20 MG TAB: 20 mg | ORAL | Qty: 1

## 2015-03-01 MED FILL — CHLORDIAZEPOXIDE 25 MG CAP: 25 mg | ORAL | Qty: 1

## 2015-03-01 MED FILL — STRESS FORMULA WITH ZINC TABLET: ORAL | Qty: 1

## 2015-03-01 MED FILL — CLONIDINE 0.2 MG TAB: 0.2 mg | ORAL | Qty: 1

## 2015-03-01 MED FILL — SOLU-MEDROL (PF) 125 MG/2 ML SOLUTION FOR INJECTION: 125 mg/2 mL | INTRAMUSCULAR | Qty: 2

## 2015-03-01 MED FILL — NUTRITIONAL PHARMACY SUPPORT ORDERS: Qty: 1

## 2015-03-01 MED FILL — INTRALIPID 20 % INTRAVENOUS EMULSION: 20 % | INTRAVENOUS | Qty: 250

## 2015-03-01 MED FILL — VITAMIN B-1 100 MG TABLET: 100 mg | ORAL | Qty: 1

## 2015-03-01 MED FILL — LORAZEPAM 2 MG/ML IJ SOLN: 2 mg/mL | INTRAMUSCULAR | Qty: 1

## 2015-03-01 MED FILL — MAGNESIUM OXIDE 400 MG TAB: 400 mg | ORAL | Qty: 1

## 2015-03-01 MED FILL — CLINIMIX 4.25 % IN 5 % DEXTROSE SULFITE FREE INTRAVENOUS SOLUTION: 4.25 % | INTRAVENOUS | Qty: 2000

## 2015-03-01 MED FILL — LOVENOX 40 MG/0.4 ML SUBCUTANEOUS SYRINGE: 40 mg/0.4 mL | SUBCUTANEOUS | Qty: 0.4

## 2015-03-01 MED FILL — NICOTINE 21 MG/24 HR DAILY PATCH: 21 mg/24 hr | TRANSDERMAL | Qty: 1

## 2015-03-01 MED FILL — LEVETIRACETAM 500 MG TAB: 500 mg | ORAL | Qty: 1

## 2015-03-01 MED FILL — FOLIC ACID 1 MG TAB: 1 mg | ORAL | Qty: 1

## 2015-03-01 MED FILL — CHLORDIAZEPOXIDE 10 MG CAP: 10 mg | ORAL | Qty: 1

## 2015-03-01 MED FILL — ALBUTEROL SULFATE 0.083 % (0.83 MG/ML) SOLN FOR INHALATION: 2.5 mg /3 mL (0.083 %) | RESPIRATORY_TRACT | Qty: 1

## 2015-03-01 NOTE — Rehab Care Plan (Signed)
Speech language pathology: bedside swallow note: Initial Assessment    NAME/AGE/GENDER: Brett Becker is a 54 y.o. male  DATE: 03/01/2015  PRIMARY DIAGNOSIS: Delirium tremens ([facility])       ICD-10: Treatment Diagnosis: Dysphgia  INTERDISCIPLINARY COLLABORATION: Registered Nurse  PRECAUTIONS/ALLERGIES: [place] ASSESSMENT:Based on the objective data described below, Brett Becker presents with dysphagia with signs and symptoms of aspiration with all PO trials.  Patient will benefit from skilled intervention to address the below impairments.????????This section established at most recent assessment??????????  PROBLEM LIST (Impairments causing functional limitations):  1. Pharyngeal Dysphgia  2. Oral Dysphgia  3. Decreased lingual strength  4. Decreased pharyngeal strength  REHABILITATION POTENTIAL FOR STATED GOALS: Fair  PLAN OF CARE:   Patient will benefit from skilled intervention to address the following impairments.  RECOMMENDATIONS AND PLANNED INTERVENTIONS (Benefits and precautions of therapy have been discussed with the patient.):  ?? NPO with alternative means of nutrition  ?? MBS  MEDICATIONS:  ?? Non-oral  ?? With Thickened Liquid  COMPENSATORY STRATEGIES/MODIFICATIONS INCLUDING:  ?? None  OTHER RECOMMENDATIONS (including follow up treatment recommendations):   ?? MBS  RECOMMENDED DIET MODIFICATIONS DISCUSSED WITH:  ?? Nursing  ?? Patient  FREQUENCY/DURATION: Continue to follow patient 1 time a week for duration of hospital stay to address above goals.RECOMMENDED REHABILITATION/EQUIPMENT: (at time of discharge pending progress):   None.SUBJECTIVE:   "Patient rambling with no intelligible speech"  History of Present Injury/Illness: Brett Becker  has a past medical history of Delirium tremens [facility]) (September, 2014) and Gastric ulcer (September, 2014).Marland Kitchen  He also  has past surgical history that includes neurological procedure unlisted; back surgery; and endoscopy (September, 2014).    Present Symptoms: Aspiration with liquids and applesauce   Pain Intensity 1: 0  Current Medications:   No current facility-administered medications on file prior to encounter.     No current outpatient prescriptions on file prior to encounter.     Current Dietary Status:  io      History of reflux:  NO   ? Reflux medication:  Social History/Home Situation:    Home Environment: Private residence  One/Two Story Residence: One story  Living Alone: Yes  Support Systems: Friends \\ neighbors  Patient Expects to be Discharged to:: Apartment  Current DME Used/Available at Home: Cane, straight, Environmental consultant, rolling  OBJECTIVE:   Respiratory Status:  Nasal cannula  2 l/min  CXR Results:  MRI/CT Results:  Oral Motor Structure/Speech:  Oral-Motor Structure/Motor Speech  Labial: Decreased seal  Lingual: Decreased strength, Decreased rate  Mandible: No impairment    Cognitive and Communication Status:  Neurologic State: Lethargic  Orientation Level: Disoriented to place;Disoriented to time;Disoriented to situation  Cognition: Decreased command following;Poor safety awareness     Perseveration: No perseveration noted  Safety/Judgement: Decreased awareness of environment    BEDSIDE SWALLOW EVALUATION  Oral Assessment:  Oral Assessment  Labial: Decreased seal  Lingual: Decreased strength;Decreased rate  Mandible: No impairment  Gag Reflex: No impairment  P.O. Trials:  Patient Position: patient sitting upright at 90 degrees in bed with restraints intact    The patient was given 1 tsp amounts of the following:   Consistency Presented: Thin liquid;Puree  How Presented: SLP-fed/presented    ORAL PHASE:  Bolus Acceptance: Impaired  Bolus Formation/Control: Impaired  Propulsion: Delayed (# of seconds)  Type of Impairment: Delayed;Lip closure  Oral Residue: None    PHARYNGEAL PHASE:  Initiation of Swallow: Delayed (# of seconds)  Laryngeal Elevation: Decreased  Aspiration Signs/Symptoms: Strong cough;Change vocal quality;Watery  eyes   Vocal Quality: Breathy;Fatigue;Wet  Cues for Modifications: None  Effective Modifications: None  Comments: patient unable to follow cues and commands for compensatory strategies  Pharyngeal Phase Characteristics: Altered vocal quality    OTHER OBSERVATIONS:  Rate/bite size: Impaired   Endurance:  Impaired   Coments: Patient with wet labored breathing before PO trials -- immediate coughing and shortness of breath with all PO trials  Tool Used: Dysphagia Outcome and Severity Scale (DOSS)    Score Comments   Normal Diet   7 With no strategies or extra time needed   Functional Swallow   6 May have mild oral or pharyngeal delay       Mild Dysphagia     5 Which may require one diet consistency restricted (those who demonstrate penetration which is entirely cleared on MBS would be included)   Mild-Moderate Dysphagia   4 With 1-2 diet consistencies restricted       Moderate Dysphagia   3 With 2 or more diet consistencies restricted       Moderately Severe Dysphagia   2 With partial PO strategies (trials with ST only)       Severe Dysphagia   1 With inability to tolerate any PO safely          Score:  Initial: 1 Most Recent: X (Date: -- )   Interpretation of Tool: The Dysphagia Outcome and Severity Scale (DOSS) is a simple, easy-to-use, 7-point scale developed to systematically rate the functional severity of dysphagia based on objective assessment and make recommendations for diet level, independence level, and type of nutrition.     Score 7 6 5 4 3 2 1    Modifier CH CI CJ CK CL CM CN   ? Swallowing:    [id number] - CURRENT STATUS: CN - 100% impaired, limited or restricted   [id number] - GOAL STATUS:  CK - 40%-59% impaired, limited or restricted   [id number] - D/C STATUS:  CI - 1%-19% impaired, limited or restricted  Payor: ADVICARE MEDICAID / Plan: [place] / Product Type: Managed Care Medicaid /     TREATMENT:    (In addition to Assessment/Re-Assessment sessions the following treatments were rendered)   Dysphagia Activities: Activities/Procedures listed utilized to improve progress in swallow strength, swallow timeliness, swallow function, diet tolerance and swallow safety. Required moderate cueing to improve swallow safety, work toward diet advancement and decrease aspiration risk.  Assessment/Reassessment only, no treatment provided today  MODALITIES:                                                                    ORAL MOTOR  EXERCISES:  LARYNGEAL / PHARYNGEAL EXERCISES:                                                                                                                                     __________________________________________________________________________________________________  Safety:   After treatment position/precautions:  ?? Restraints  ?? Upright in Bed  Treatment Assessment:  Patient with signs and symptoms bedside due to oral and pharyngeal dysphagia - recommend MBS.  Progression/Medical Necessity:   ?? Patient is expected to demonstrate progress in swallow strength, swallow timeliness, swallow function, diet tolerance and swallow safety to improve swallow safety, work toward diet advancement and decrease aspiration risk.  Compliance with Program/Exercises: Will assess as treatment progresses.   Reason for Continuation of Services/Other Comments:  ?? Patient's improvement in swallow strength, swallow timeliness, swallow function, diet tolerance and swallow safety has impacted their ability to swallow by phonation/respiration coordination  Recommendations/Intent for next treatment session: "Treatment next visit will focus on advancements to more challenging activities".    Total Treatment Duration:          Karin Golden, SLP

## 2015-03-01 NOTE — Progress Notes (Addendum)
Problem: Nutrition Deficit  Goal: *Optimize nutritional status  Nutrition  Reason for assessment: Telephone order (Dr. Trudi Idarew-hospitalist) for Management of TPN.    Assessment:   Diet order(s): NPO  Food/Nutrition Patient History:  Pt admitted with DT's. Pt exhibited s/s of aspiration with po trial today and ST recommended NPO status. MD ordered TPN and PICC line to meet nutritional needs until MBS can be completed. Since PICC is currently not in place, will need to order peripheral solution in interim.  Anthropometrics:Height: 5\' 8"  (172.7 cm),  Weight: 51.937 kg (114 lb 8 oz), Weight Source: Bed, Body mass index is 17.41 kg/(m^2). BMI class of underweight.   Pertinent Labs:  No labs available on 3/19, Labs reviewed from 3/18 and fairly stable  Pertinent Meds:  NS at 25 mL/hr, Solu-Medrol  Po meds that were administered this AM prior to NPO status: Folvite (1 mg), Mag-Ox, Stress Tab with Zinc, Thiamine 100 mg. MD did not place Thiamine and Folic Acid in MD TPN order and since pt already received these orally today will defer adding to TPN on 3/19.   Macronutrient needs:  EER:  1557-1816 kcal /day (30-35 kcal/kg Actual BW)  EPR:  62-72 grams protein/day (1.2-1.4 grams/kg IBW)  Max CHO: 299 g CHO/day (4 mg/kg Actual BW/min)  Fluid: 1 mL/kcal or per MD  Intake/Comparative Standards: Pt currently NPO not meeting estimated needs.    Nutrition Diagnosis: Difficulty swallowing r/t pharyngeal dysphagia aeb ST recommending NPO status and Modified Barium Swallow.    Intervention:  1. Meals and snacks: NPO.  2. Parenteral Nutrition: Begin 1.68 L of DEX 5%/4.25% AA TPN solution with 250 mL daily lipids. This supplies 1071 kcals (69% of needs), 71 g protein (100% of needs), 84 gm dextrose (does not exceed max limit) and 1680 mL fluid (100% of needs). This solution can infuse through either peripheral or central line. Goal solution to be determined based on tolerance. RD ordered necessary lab work for TPN monitoring.   3. IV Fluids: Discontinue NS when TPN begins at 1800 on 3/19.  4. Coordination of Nutrition Care: Discussed with MD (Dr. Kenard Gowerrew), 7th floor RN's and Pharmacy.    Su LeyErin Willey, MS,RD/LD [phone]

## 2015-03-01 NOTE — Progress Notes (Signed)
During bedside clinical evaluation patient presented with aspiration / penetration with think liquids and puree.  Recommend NPO with alternate nutrition with an MBS to further assess.  Karin GoldenLORI A MCKITRICK, SLP

## 2015-03-01 NOTE — Progress Notes (Signed)
HOSPITALIST Progress Note    NAME:  Brett Becker   Age:  54 y.o.  DOB:   July 23, 1961   MRN:   [id number]  PCP: Sol Blazing, MD  Chief complaint: AMS    Subjective:     Patient is a 54 y.o. white  male who presents with altered mental status.  Pt has prior admission by me for DT's in 2014.  He was brought to the ED by a friend due to diminished responsiveness and seizure at home.  No family or friends present at this time.  Pt able to give limited history due to lethargy.  He denies ever having had a seizure in his life, though he has had documented seizures previously.  Pt states he normally drinks 6-8 beers per day and hadn't been drinking as much today.  He denies chest pain, dyspnea, nausea, vomiting, abdominal pain.  He reports normally having a tremor, but it is worse at the present moment.  He also reports to me that he saw a red ball bouncing around his house today, but knew that it wasn't actually there.    3/16 - Patient in bed with restraints, looks agitated, stating he wants to a knife to but the restraints. Does not threaten staff though, I have stressed that he needs to relax and rest. Mild tremors this am, CIWA protocol.      3/17 - Resting comfortably this am, decrease in BL Wheezing on exam, Sats improved    3/18 - Patient more confused as prior days I don't appreciate any focal deficits at this time, will obtain a CT brain w/o contrast, and ammonia levels. Continue CIWA protocol, LFTs trending down.     3/19 - Patient knocking off his NC, will increased Systemic steroids as he did not tolerate titration. Maintain o2 sat>90%. Sedated this am, and restraints orders renewed.     Past Medical History   Diagnosis Date   ??? 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  Found to have GI bleeding secondary to gastric ullcers.   ??? Gastric ulcer September, 2014     Found at EGD to have major gastric ulcers.       Past Surgical History   Procedure Laterality Date    ??? Pr neurological procedure unlisted     ??? Hx back surgery       x 2   ??? Hx endoscopy  September, 2014     Major gastric ulcer       No current facility-administered medications on file prior to encounter.     No current outpatient prescriptions on file prior to encounter.       Allergies   Allergen Reactions   ??? Codeine Rash       History   Substance Use Topics   ??? Smoking status: Current Every Day Smoker -- 1.00 packs/day for 40 years     Types: Cigarettes   ??? Smokeless tobacco: Never Used   ??? Alcohol Use: Yes       Family History   Problem Relation Age of Onset   ??? Heart Disease Mother      CHF       I have personally reviewed and reconciled patients history.    Review of Systems  A comprehensive 12 point review of systems is negative other than what is listed above.    Objective:     Patient Vitals for the past 24 hrs:  BP Temp Pulse Resp SpO2   03/01/15 0834 - - - - 90 %   03/01/15 0722 (!) 144/94 mmHg 97.4 ??F (36.3 ??C) 73 20 92 %   03/01/15 0421 137/87 mmHg 96.8 ??F (36 ??C) 79 24 94 %   03/01/15 0004 138/83 mmHg 96.7 ??F (35.9 ??C) 89 19 93 %   02/28/15 2041 121/82 mmHg - - - -   02/28/15 2013 121/82 mmHg 96.7 ??F (35.9 ??C) 91 19 93 %   02/28/15 1515 150/87 mmHg 97.4 ??F (36.3 ??C) 100 19 94 %   02/28/15 1057 153/82 mmHg 98.5 ??F (36.9 ??C) (!) 104 20 94 %       Exam:  General: awake, alert, no apparent distress  Eyes: PERRL, anicteric  Neck: Supple, trachea midline, no palpable lymphadenopathy  Lungs: Clear to auscultation bilaterally.  No rales, wheezes, or rhonchi  Heart: Regular rhythm and tachycardic.  No appreciable murmur.  Abdomen: Soft, nontender, nondistended.  Bowel sounds normal.  No rebound tenderness, guarding, or rigidity.  Extremities:  No LE edema.  Skin: Warm/dry. No rashes or lesions.  Neurologic: CN II-XII grossly intact bilaterally.  Bilateral UE tremors worse with movement.  No focal deficits.  Psych: AOx3.  Flat affect.      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

## 2015-03-01 NOTE — Progress Notes (Addendum)
Patient suctioned due to increase secretions, weak coughing and difficulty clearing throat.  Moderate Thick off white secretions suctioned.  Sats 94% on room air, HOB up, lungs sounds with light wheezing and congestion.  Mouth care given. Made patient NPO earlier due to difficulty with water.

## 2015-03-02 LAB — PHOSPHORUS: Phosphorus: 3.2 MG/DL (ref 2.5–4.5)

## 2015-03-02 LAB — METABOLIC PANEL, BASIC
Anion gap: 9 mmol/L (ref 7–16)
BUN: 21 MG/DL (ref 6–23)
CO2: 22 mmol/L (ref 21–32)
Calcium: 8.3 MG/DL (ref 8.3–10.4)
Chloride: 104 mmol/L (ref 98–107)
Creatinine: 0.66 MG/DL — ABNORMAL LOW (ref 0.8–1.5)
GFR est AA: >60 mL/min/{1.73_m2} (ref >60)
GFR est non-AA: >60 mL/min/{1.73_m2} (ref >60)
Glucose: 155 mg/dL — ABNORMAL HIGH (ref 65–100)
Potassium: 3.5 mmol/L (ref 3.5–5.1)
Sodium: 135 mmol/L — ABNORMAL LOW (ref 136–145)

## 2015-03-02 LAB — TRIGLYCERIDE: Triglyceride: 102 MG/DL (ref 35–150)

## 2015-03-02 LAB — MAGNESIUM: Magnesium: 1.9 mg/dL (ref 1.8–2.4)

## 2015-03-02 MED ORDER — HEPARIN, PORCINE (PF) 100 UNIT/ML IV SYRINGE
100 unit/mL | INTRAVENOUS | Status: DC | PRN
Start: 2015-03-02 — End: 2015-03-13

## 2015-03-02 MED ORDER — HEPARIN, PORCINE (PF) 100 UNIT/ML IV SYRINGE
100 unit/mL | Freq: Three times a day (TID) | INTRAVENOUS | Status: DC
Start: 2015-03-02 — End: 2015-03-13
  Administered 2015-03-02 – 2015-03-13 (×33)

## 2015-03-02 MED ORDER — TRACE ELEMENTS-ADULT CR-CU-MN-SE-ZN 0.01MG-1MG-0.5MG-60MCG-5MG/ML IV SOLN
10-1-0.5-60-5 mcg-1 mg-0.5 mg-60mcg-5mg/mL | Freq: Every evening | INTRAVENOUS | Status: DC
Start: 2015-03-02 — End: 2015-03-03
  Administered 2015-03-02: 22:00:00 via INTRAVENOUS

## 2015-03-02 MED ORDER — FAT EMULSION 20 % IV EMUL
20 % | Freq: Every evening | INTRAVENOUS | Status: DC
Start: 2015-03-02 — End: 2015-03-05
  Administered 2015-03-02 – 2015-03-04 (×3): via INTRAVENOUS

## 2015-03-02 MED ORDER — MORPHINE 2 MG/ML INJECTION
2 mg/mL | Freq: Once | INTRAMUSCULAR | Status: AC
Start: 2015-03-02 — End: 2015-03-02
  Administered 2015-03-02: 08:00:00 via INTRAVENOUS

## 2015-03-02 MED ORDER — METHYLPREDNISOLONE (PF) 40 MG/ML IJ SOLR
40 mg/mL | Freq: Three times a day (TID) | INTRAMUSCULAR | Status: DC
Start: 2015-03-02 — End: 2015-03-11
  Administered 2015-03-03 – 2015-03-11 (×26): via INTRAVENOUS

## 2015-03-02 MED ORDER — SODIUM CHLORIDE 0.9 % IJ SYRG
Freq: Three times a day (TID) | INTRAMUSCULAR | Status: DC
Start: 2015-03-02 — End: 2015-03-05
  Administered 2015-03-02 – 2015-03-05 (×7)

## 2015-03-02 MED ORDER — SODIUM CHLORIDE 0.9 % IJ SYRG
INTRAMUSCULAR | Status: DC | PRN
Start: 2015-03-02 — End: 2015-03-13
  Administered 2015-03-10 – 2015-03-13 (×3)

## 2015-03-02 MED FILL — MONOJECT PREFILL ADVANCED (PF) 100 UNIT/ML INTRAVENOUS SYRINGE: 100 unit/mL | INTRAVENOUS | Qty: 6

## 2015-03-02 MED FILL — CLINIMIX 4.25 % IN 10 % DEXTROSE SULFITE FREE INTRAVENOUS SOLUTION: 4.25 % | INTRAVENOUS | Qty: 2000

## 2015-03-02 MED FILL — STRESS FORMULA WITH ZINC TABLET: ORAL | Qty: 1

## 2015-03-02 MED FILL — IPRATROPIUM-ALBUTEROL 2.5 MG-0.5 MG/3 ML NEB SOLUTION: 2.5 mg-0.5 mg/3 ml | RESPIRATORY_TRACT | Qty: 3

## 2015-03-02 MED FILL — VITAMIN B-1 100 MG TABLET: 100 mg | ORAL | Qty: 1

## 2015-03-02 MED FILL — NICOTINE 21 MG/24 HR DAILY PATCH: 21 mg/24 hr | TRANSDERMAL | Qty: 1

## 2015-03-02 MED FILL — LORAZEPAM 2 MG/ML IJ SOLN: 2 mg/mL | INTRAMUSCULAR | Qty: 1

## 2015-03-02 MED FILL — SOLU-MEDROL (PF) 125 MG/2 ML SOLUTION FOR INJECTION: 125 mg/2 mL | INTRAMUSCULAR | Qty: 2

## 2015-03-02 MED FILL — MAGNESIUM OXIDE 400 MG TAB: 400 mg | ORAL | Qty: 1

## 2015-03-02 MED FILL — SOLU-MEDROL (PF) 40 MG/ML SOLUTION FOR INJECTION: 40 mg/mL | INTRAMUSCULAR | Qty: 1

## 2015-03-02 MED FILL — INTRALIPID 20 % INTRAVENOUS EMULSION: 20 % | INTRAVENOUS | Qty: 250

## 2015-03-02 MED FILL — CHLORDIAZEPOXIDE 10 MG CAP: 10 mg | ORAL | Qty: 1

## 2015-03-02 MED FILL — FOLIC ACID 1 MG TAB: 1 mg | ORAL | Qty: 1

## 2015-03-02 MED FILL — HYDRALAZINE 20 MG/ML IJ SOLN: 20 mg/mL | INTRAMUSCULAR | Qty: 1

## 2015-03-02 MED FILL — LEVETIRACETAM 500 MG/5 ML IV SOLN: 500 mg/5 mL | INTRAVENOUS | Qty: 5

## 2015-03-02 MED FILL — LOVENOX 40 MG/0.4 ML SUBCUTANEOUS SYRINGE: 40 mg/0.4 mL | SUBCUTANEOUS | Qty: 0.4

## 2015-03-02 MED FILL — CLONIDINE 0.2 MG TAB: 0.2 mg | ORAL | Qty: 1

## 2015-03-02 MED FILL — MORPHINE 2 MG/ML INJECTION: 2 mg/mL | INTRAMUSCULAR | Qty: 1

## 2015-03-02 MED FILL — HALOPERIDOL LACTATE 5 MG/ML IJ SOLN: 5 mg/mL | INTRAMUSCULAR | Qty: 1

## 2015-03-02 NOTE — Progress Notes (Signed)
PICC PLACEMENT NOTE    PRE-PROCEDURE VERIFICATION    Correct Patient: Yes (Time out preformed)LISA COBB RN IN AGREEMENT WITH TIME OUT.  Consent Procedure: Yes  Appropriate Site: Yes    Temperature: Temp: 97.3 ??F (36.3 ??C), Temperature Source: Temp Source: Oral    Recent Labs      03/02/15   0625  02/28/15   0516   BUN  21  16   CREA  0.66*  0.68*   PLT   --   201   WBC   --   9.0       Allergies: Codeine    Education materials for PICC Care given to family: yes. See Patient Education activity for further details.  PICC Booklet placed on bedside table.    PROCEDURE DETAIL  A double lumen PICC line was started for vascular access. The following documentation is in addition to the PICC properties in the lines/airways flowsheet :  Lot #: [id number]  xylocaine used: yes  Mid-Arm Circumference: 24 (cm)  Internal Catheter Length: 38 (cm)  Internal Catheter Total Length: 38 (cm)  Vein Selection for PICC:right basilic  Central Line Bundle followed yes  Complication Related to Insertion: none  Ports flushed with positive blood return in each port.  Guidewires removed intact.    The placement was verified by ecg technology: yes. The ecg technology results state the tip location is on the right side and the tip overlies the lower  superior vena cava. Primary nurse notified.    Line is okay to use: yes      Herbert l Robert Bellowsbey Jr, RN

## 2015-03-02 NOTE — Progress Notes (Signed)
HOSPITALIST Progress Note    NAME:  Brett Becker   Age:  54 y.o.  DOB:   July 17, 1961   MRN:   [id number]  PCP: Sol Blazing, MD  Chief complaint: AMS    Subjective:     Patient is a 54 y.o. white  male who presents with altered mental status.  Pt has prior admission by me for DT's in 2014.  He was brought to the ED by a friend due to diminished responsiveness and seizure at home.  No family or friends present at this time.  Pt able to give limited history due to lethargy.  He denies ever having had a seizure in his life, though he has had documented seizures previously.  Pt states he normally drinks 6-8 beers per day and hadn't been drinking as much today.  He denies chest pain, dyspnea, nausea, vomiting, abdominal pain.  He reports normally having a tremor, but it is worse at the present moment.  He also reports to me that he saw a red ball bouncing around his house today, but knew that it wasn't actually there.    3/16 - Patient in bed with restraints, looks agitated, stating he wants to a knife to but the restraints. Does not threaten staff though, I have stressed that he needs to relax and rest. Mild tremors this am, CIWA protocol.      3/17 - Resting comfortably this am, decrease in BL Wheezing on exam, Sats improved    3/18 - Patient more confused as prior days I don't appreciate any focal deficits at this time, will obtain a CT brain w/o contrast, and ammonia levels. Continue CIWA protocol, LFTs trending down.     3/19 - Patient knocking off his NC, will increased Systemic steroids as he did not tolerate titration. Maintain o2 sat>90%. Sedated this am, and restraints orders renewed.     3/20 - Patient more alert today than previous 2 days, again has taken his NC off.     Past Medical History   Diagnosis Date   ??? 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  Found to have GI bleeding secondary to gastric ullcers.   ??? Gastric ulcer September, 2014      Found at EGD to have major gastric ulcers.       Past Surgical History   Procedure Laterality Date   ??? Pr neurological procedure unlisted     ??? Hx back surgery       x 2   ??? Hx endoscopy  September, 2014     Major gastric ulcer       No current facility-administered medications on file prior to encounter.     No current outpatient prescriptions on file prior to encounter.       Allergies   Allergen Reactions   ??? Codeine Rash       History   Substance Use Topics   ??? Smoking status: Current Every Day Smoker -- 1.00 packs/day for 40 years     Types: Cigarettes   ??? Smokeless tobacco: Never Used   ??? Alcohol Use: Yes       Family History   Problem Relation Age of Onset   ??? Heart Disease Mother      CHF       I have personally reviewed and reconciled patients history.    Review of Systems  A comprehensive 12 point review of systems is negative other than what  is listed above.    Objective:     Patient Vitals for the past 24 hrs:   BP Temp Pulse Resp SpO2   03/02/15 0717 149/81 mmHg 97.5 ??F (36.4 ??C) 83 18 92 %   03/02/15 0520 (!) 152/95 mmHg 98.3 ??F (36.8 ??C) 70 17 98 %   03/02/15 0338 (!) 142/92 mmHg - - - -   03/02/15 0201 - - - - 90 %   03/02/15 0145 (!) 174/98 mmHg - - - -   03/02/15 0107 (!) 172/98 mmHg 98.5 ??F (36.9 ??C) 61 17 96 %   03/01/15 1949 147/88 mmHg 98.6 ??F (37 ??C) 68 18 96 %   03/01/15 1914 - - - - 92 %   03/01/15 1434 122/71 mmHg 97.5 ??F (36.4 ??C) (!) 109 20 95 %   03/01/15 1409 - - - - 91 %   03/01/15 1108 143/86 mmHg 97.4 ??F (36.3 ??C) 77 20 93 %   03/01/15 0834 - - - - 90 %       Exam:  General: awake, alert, no apparent distress  Eyes: PERRL, anicteric  Neck: Supple, trachea midline, no palpable lymphadenopathy  Lungs: Clear to auscultation bilaterally.  No rales, wheezes, or rhonchi  Heart: Regular rhythm and tachycardic.  No appreciable murmur.  Abdomen: Soft, nontender, nondistended.  Bowel sounds normal.  No rebound tenderness, guarding, or rigidity.  Extremities:  No LE edema.   Skin: Warm/dry. No rashes or lesions.  Neurologic: CN II-XII grossly intact bilaterally.  Bilateral UE tremors worse with movement.  No focal deficits.  Psych: AOx3.  Flat affect.      Data Review (Labs):   Recent Results (from the past 24 hour(s))   METABOLIC PANEL, BASIC    Collection Time: 03/02/15  6:25 AM   Result Value Ref Range    Sodium 135 (L) 136 - 145 mmol/L    Potassium 3.5 3.5 - 5.1 mmol/L    Chloride 104 98 - 107 mmol/L    CO2 22 21 - 32 mmol/L    Anion gap 9 7 - 16 mmol/L    Glucose 155 (H) 65 - 100 mg/dL    BUN 21 6 - 23 MG/DL    Creatinine 1.300.66 (L) 0.8 - 1.5 MG/DL    GFR est AA >86>60 >57>60 QI/ONG/2.95M8ml/min/1.73m2    GFR est non-AA >60 >60 ml/min/1.3773m2    Calcium 8.3 8.3 - 10.4 MG/DL   MAGNESIUM    Collection Time: 03/02/15  6:25 AM   Result Value Ref Range    Magnesium 1.9 1.8 - 2.4 mg/dL   PHOSPHORUS    Collection Time: 03/02/15  6:25 AM   Result Value Ref Range    Phosphorus 3.2 2.5 - 4.5 MG/DL   TRIGLYCERIDE    Collection Time: 03/02/15  6:25 AM   Result Value Ref Range    Triglyceride 102 35 - 150 MG/DL       Assessment:   Principal Problem:    Delirium tremens (HCC) (02/25/2015)    Active Problems:    Alcohol abuse (08/28/2013)      HTN (hypertension) (08/30/2013)      Hepatitis C (08/31/2013)      Hyponatremia (02/25/2015)    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

## 2015-03-02 NOTE — Other (Signed)
B/P 178/98 PRN Hydralazine given. Will continue to monitor blood pressure.

## 2015-03-02 NOTE — Progress Notes (Addendum)
Problem: Nutrition Deficit  Goal: *Optimize nutritional status  Nutrition F/U: Management of TPN (Dr. Trudi Idarew-hospitalist)    Assessment:    Diet order(s): NPO  Food/Nutrition Patient History:  Pt did not get a PICC placed last night so PPN began however pt has pulled out his Peripheral IV several times and is currently without a line per RN. RN reports PICC line will likely be placed in next hour per Vascular Access team.  Anthropometrics:Height: 5\' 8"  (172.7 cm),  Weight: 51.937 kg (114 lb 8 oz), Weight Source: Bed, Body mass index is 17.41 kg/(m^2). BMI class of underweight.    Pertinent Labs:  - K low normal value of 3.5- Current PPN contains ~50 mEq K/day and pt would benefit from increase in TPN.  - BMP BS of 155 mg/dL-pt remains on Solu-Medrol. Will defer SQBS and SSI until BMP BS consistently over 180 mg/dL    Pertinent Meds:  Po Thiamine, MVI and Folic Acid being held due to NPO status- RD will place in TPN bag.  Macronutrient needs:  EER:  1557-1816 kcal /day (30-35 kcal/kg Actual BW)  EPR:  62-72 grams protein/day (1.2-1.4 grams/kg IBW)  Max CHO: 299 g CHO/day (4 mg/kg Actual BW/min)  Fluid: 1 mL/kcal or per MD  Intake/Comparative Standards: Pt currently NPO not meeting estimated needs.    Nutrition Diagnosis: Difficulty swallowing r/t pharyngeal dysphagia aeb ST recommending NPO status and Modified Barium Swallow.    Intervention:  1. Meals and snacks: NPO.  2. Parenteral Nutrition: Change order to central solution. Begin DEX 10%/4.25% AA at 70 mL/hr with 250 mL daily lipids. This supplies 1357 kcals (87% of needs), 71 g protein (100% of needs), 168 g dextrose (does not exceed max limit) and 1930 mL fluid (100% of needs). Increase Na in TPN to 150 mEq/L (100 mEq/L NaCl and 50 mEq/L NaAcetate), Increase K to 50 mEq/L (15 mEq/L KPhos and 35 mEq/L KCl). Increase Magnesium to 8 mEq/L. Add 1 mg Folic Acid and 100 mg Thiamine to TPN bag as pt is not able to take orally. TPN in process of advancing to goal.   3. Coordination of Nutrition Care: Discussed with RN Trixie Deis(Kelly Johnson)    Su LeyErin Willey, MS,RD/LD [phone]

## 2015-03-02 NOTE — Progress Notes (Signed)
Attempted PT treatment at 1000 this morning and RN suggested checking back in PM.  Restrained and a bit agitated right now.  KAREN L CULUMOVIC, PT

## 2015-03-02 NOTE — Progress Notes (Signed)
Patient unhooked his IV twice, both times resulting in excess amounts of blood in bed and on floor.  PICC line ordered by Dr Kenard Gowerrew.  Will administer IV meds when PICC is placed

## 2015-03-02 NOTE — Progress Notes (Signed)
Emmie NiemannNotify Simpson, MD patient c/o hand pain related to IV infiltration. [facility]

## 2015-03-02 NOTE — Progress Notes (Signed)
Reviewed notes prior to visit  Patient restrained  No visitors  Provided calming presence  Spiritual concerns voiced: none  Signed by Farrel Gobbleandy Brook[place], MDiv, [facility], staff chaplain

## 2015-03-02 NOTE — Other (Signed)
Patient pulled out IV and sat up on the side of the bed after lab tech removed left soft wrist restraint. Patient refused lab and demanded "apple juice!!" Re-orientated patient on plan of care. CC nurse drawn morning labs.

## 2015-03-03 ENCOUNTER — Inpatient Hospital Stay: Admit: 2015-03-03 | Payer: MEDICAID | Primary: Family Medicine

## 2015-03-03 ENCOUNTER — Encounter (INDEPENDENT_AMBULATORY_CARE_PROVIDER_SITE_OTHER): Payer: PPO | Admitting: Internal Medicine

## 2015-03-03 LAB — METABOLIC PANEL, COMPREHENSIVE
A-G Ratio: 0.6 — ABNORMAL LOW (ref 1.2–3.5)
ALT (SGPT): 175 U/L — ABNORMAL HIGH (ref 12–65)
AST (SGOT): 120 U/L — ABNORMAL HIGH (ref 15–37)
Albumin: 2.8 g/dL — ABNORMAL LOW (ref 3.5–5.0)
Alk. phosphatase: 54 U/L (ref 50–136)
Anion gap: 10 mmol/L (ref 7–16)
BUN: 24 MG/DL — ABNORMAL HIGH (ref 6–23)
Bilirubin, total: 0.6 MG/DL (ref 0.2–1.1)
CO2: 23 mmol/L (ref 21–32)
Calcium: 8.3 MG/DL (ref 8.3–10.4)
Chloride: 108 mmol/L — ABNORMAL HIGH (ref 98–107)
Creatinine: 0.76 MG/DL — ABNORMAL LOW (ref 0.8–1.5)
GFR est AA: >60 mL/min/{1.73_m2} (ref >60)
GFR est non-AA: >60 mL/min/{1.73_m2} (ref >60)
Globulin: 4.4 g/dL — ABNORMAL HIGH (ref 2.3–3.5)
Glucose: 135 mg/dL — ABNORMAL HIGH (ref 65–100)
Potassium: 3.4 mmol/L — ABNORMAL LOW (ref 3.5–5.1)
Protein, total: 7.2 g/dL (ref 6.3–8.2)
Sodium: 141 mmol/L (ref 136–145)

## 2015-03-03 LAB — MAGNESIUM: Magnesium: 1.9 mg/dL (ref 1.8–2.4)

## 2015-03-03 LAB — PHOSPHORUS: Phosphorus: 4 MG/DL (ref 2.5–4.5)

## 2015-03-03 MED ORDER — BARIUM SULFATE 40 % (W/V), 30% (W/W) ORAL PASTE
40 % (w/v), 30% (w/w) | Freq: Once | ORAL | Status: AC
Start: 2015-03-03 — End: 2015-03-03
  Administered 2015-03-03: 16:00:00 via ORAL

## 2015-03-03 MED ORDER — POTASSIUM CHLORIDE 40 MEQ/100 ML IV PIGGY BACK
40 mEq/100 mL | Freq: Once | INTRAVENOUS | Status: AC
Start: 2015-03-03 — End: 2015-03-06
  Administered 2015-03-03: 17:00:00 via INTRAVENOUS

## 2015-03-03 MED ORDER — BARIUM SULFATE 40 % (W/V), 29% (W/W) (3,000 CPS) ORAL SUSPENSION
40 % (w/v) 29% (w/w) | Freq: Once | ORAL | Status: AC
Start: 2015-03-03 — End: 2015-03-03
  Administered 2015-03-03: 16:00:00 via ORAL

## 2015-03-03 MED ORDER — BARIUM SULFATE 40 % (W/V), 30 % (W/W) ORAL SUSPENSION
40 % (w/v) | Freq: Once | ORAL | Status: AC
Start: 2015-03-03 — End: 2015-03-03
  Administered 2015-03-03: 16:00:00 via ORAL

## 2015-03-03 MED ORDER — TUBERCULIN PPD 5 UNIT/0.1 ML INTRADERMAL
5 tub. unit /0.1 mL | Freq: Once | INTRADERMAL | Status: AC
Start: 2015-03-03 — End: 2015-03-03
  Administered 2015-03-03: 19:00:00 via INTRADERMAL

## 2015-03-03 MED ORDER — HALOPERIDOL LACTATE 5 MG/ML IJ SOLN
5 mg/mL | Freq: Four times a day (QID) | INTRAMUSCULAR | Status: DC | PRN
Start: 2015-03-03 — End: 2015-03-07

## 2015-03-03 MED ORDER — CLONIDINE 0.2 MG/24 HR WEEKLY TRANSDERM PATCH
0.2 mg/24 hr | TRANSDERMAL | Status: DC
Start: 2015-03-03 — End: 2015-03-17
  Administered 2015-03-10 (×2): via TRANSDERMAL

## 2015-03-03 MED ORDER — POTASSIUM PHOSPHATE DIBASIC 3 MMOLE/ML IV
3 mmol/mL | Freq: Every evening | INTRAVENOUS | Status: DC
Start: 2015-03-03 — End: 2015-03-05
  Administered 2015-03-03 – 2015-03-04 (×2): via INTRAVENOUS

## 2015-03-03 MED ORDER — BARIUM SULFATE 98 % ORAL SUSP, RECON
98 % | Freq: Once | ORAL | Status: AC
Start: 2015-03-03 — End: 2015-03-03
  Administered 2015-03-03: 16:00:00 via ORAL

## 2015-03-03 MED FILL — LORAZEPAM 2 MG/ML IJ SOLN: 2 mg/mL | INTRAMUSCULAR | Qty: 1

## 2015-03-03 MED FILL — TUBERSOL 5 TUB. UNIT/0.1 ML INTRADERMAL INJECTION SOLUTION: 5 tub. unit /0.1 mL | INTRADERMAL | Qty: 1

## 2015-03-03 MED FILL — NICOTINE 21 MG/24 HR DAILY PATCH: 21 mg/24 hr | TRANSDERMAL | Qty: 1

## 2015-03-03 MED FILL — SOLU-MEDROL (PF) 40 MG/ML SOLUTION FOR INJECTION: 40 mg/mL | INTRAMUSCULAR | Qty: 1

## 2015-03-03 MED FILL — INTRALIPID 20 % INTRAVENOUS EMULSION: 20 % | INTRAVENOUS | Qty: 250

## 2015-03-03 MED FILL — TAGITOL V 40 % (W/V), 30 % (W/W) ORAL SUSPENSION: 40 % (w/v) | ORAL | Qty: 60

## 2015-03-03 MED FILL — VARIBAR THIN HONEY 40 % (W/V), 29 % (W/W) (1,500 CPS) ORAL SUSPENSION: 40 %(w/v), 29% (w/w)(1500 CPS) | ORAL | Qty: 15

## 2015-03-03 MED FILL — VARIBAR PUDDING 40 % (W/V), 30% (W/W) ORAL PASTE: 40 % (w/v), 30% (w/w) | ORAL | Qty: 15

## 2015-03-03 MED FILL — E-Z-HD BARIUM 98 % ORAL SUSPENSION: 98 % | ORAL | Qty: 45

## 2015-03-03 MED FILL — HYDRALAZINE 20 MG/ML IJ SOLN: 20 mg/mL | INTRAMUSCULAR | Qty: 1

## 2015-03-03 MED FILL — LEVETIRACETAM 500 MG/5 ML IV SOLN: 500 mg/5 mL | INTRAVENOUS | Qty: 5

## 2015-03-03 MED FILL — LOVENOX 40 MG/0.4 ML SUBCUTANEOUS SYRINGE: 40 mg/0.4 mL | SUBCUTANEOUS | Qty: 0.4

## 2015-03-03 MED FILL — IPRATROPIUM-ALBUTEROL 2.5 MG-0.5 MG/3 ML NEB SOLUTION: 2.5 mg-0.5 mg/3 ml | RESPIRATORY_TRACT | Qty: 3

## 2015-03-03 MED FILL — CLINIMIX 5 % IN 15 % DEXTROSE SULFITE FREE INTRAVENOUS SOLUTION: 5 % | INTRAVENOUS | Qty: 2000

## 2015-03-03 MED FILL — MONOJECT PREFILL ADVANCED (PF) 100 UNIT/ML INTRAVENOUS SYRINGE: 100 unit/mL | INTRAVENOUS | Qty: 6

## 2015-03-03 MED FILL — CLONIDINE 0.2 MG/24 HR WEEKLY TRANSDERM PATCH: 0.2 mg/24 hr | TRANSDERMAL | Qty: 1

## 2015-03-03 MED FILL — POTASSIUM CHLORIDE 40 MEQ/100 ML IV PIGGY BACK: 40 mEq/100 mL | INTRAVENOUS | Qty: 100

## 2015-03-03 NOTE — Progress Notes (Addendum)
HOSPITALIST Progress Note    NAME:  Brett Becker   Age:  54 y.o.  DOB:   [birth year]   MRN:   [id number]  PCP: Lacey Jensen, MD  Chief complaint: AMS    Subjective:     Patient is a 54 y.o. white  male who presents with altered mental status.  Pt has prior admission by me for DT's in 2014.  He was brought to the ED by a friend due to diminished responsiveness and seizure at home.  No family or friends present at this time.  Pt able to give limited history due to lethargy.  He denies ever having had a seizure in his life, though he has had documented seizures previously.  Pt states he normally drinks 6-8 beers per day and hadn't been drinking as much today.  He denies chest pain, dyspnea, nausea, vomiting, abdominal pain.  He reports normally having a tremor, but it is worse at the present moment.  He also reports to me that he saw a red ball bouncing around his house today, but knew that it wasn't actually there.    3/16 - Patient in bed with restraints, looks agitated, stating he wants to a knife to but the restraints. Does not threaten staff though, I have stressed that he needs to relax and rest. Mild tremors this am, CIWA protocol.      3/17 - Resting comfortably this am, decrease in BL Wheezing on exam, Sats improved    3/18 - Patient more confused as prior days I don't appreciate any focal deficits at this time, will obtain a CT brain w/o contrast, and ammonia levels. Continue CIWA protocol, LFTs trending down.     3/19 - Patient knocking off his NC, will increased Systemic steroids as he did not tolerate titration. Maintain o2 sat>90%. Sedated this am, and restraints orders renewed.     3/20 - Patient more alert today than previous 2 days, again has taken his NC off.     3/21- TPN, MDS today via SW, less agitated this am, continue CIWA protocol. Failed MDS, switched meds to IV or topical many were already    Past Medical History   Diagnosis Date   ??? Delirium tremens ([place]) September, 2014      Admitted with DTs and seizure activity.  Found to have GI bleeding secondary to gastric ullcers.   ??? Gastric ulcer September, 2014     Found at EGD to have major gastric ulcers.       Past Surgical History   Procedure Laterality Date   ??? Pr neurological procedure unlisted     ??? Hx back surgery       x 2   ??? Hx endoscopy  September, 2014     Major gastric ulcer       No current facility-administered medications on file prior to encounter.     No current outpatient prescriptions on file prior to encounter.       Allergies   Allergen Reactions   ??? Codeine Rash       History   Substance Use Topics   ??? Smoking status: Current Every Day Smoker -- 1.00 packs/day for 40 years     Types: Cigarettes   ??? Smokeless tobacco: Never Used   ??? Alcohol Use: Yes       Family History   Problem Relation Age of Onset   ??? Heart Disease Mother      CHF  I have personally reviewed and reconciled patients history.    Review of Systems  A comprehensive 12 point review of systems is negative other than what is listed above.    Objective:     Patient Vitals for the past 24 hrs:   BP Temp Pulse Resp SpO2   03/03/15 0825 - - - - 91 %   03/03/15 0743 (!) 176/94 mmHg 97.5 ??F (36.4 ??C) 98 22 90 %   03/03/15 0350 150/85 mmHg 97.8 ??F (36.6 ??C) 89 20 90 %   03/03/15 0122 - - - - 94 %   03/02/15 2252 (!) 153/99 mmHg 97.6 ??F (36.4 ??C) (!) 103 20 93 %   03/02/15 1912 (!) 158/94 mmHg 97.5 ??F (36.4 ??C) 86 20 97 %   03/02/15 1900 - - - - 95 %   03/02/15 1453 (!) 171/97 mmHg 97.3 ??F (36.3 ??C) 91 20 100 %   03/02/15 1450 - - - - 95 %   03/02/15 1129 142/83 mmHg 97.2 ??F (36.2 ??C) 97 20 94 %       Exam:  General: awake, alert, no apparent distress  Eyes: PERRL, anicteric  Neck: Supple, trachea midline, no palpable lymphadenopathy  Lungs: Clear to auscultation bilaterally.  No rales, wheezes, or rhonchi  Heart: Regular rhythm and tachycardic.  No appreciable murmur.  Abdomen: Soft, nontender, nondistended.  Bowel sounds normal.  No rebound  tenderness, guarding, or rigidity.  Extremities:  No LE edema.  Skin: Warm/dry. No rashes or lesions.  Neurologic: CN II-XII grossly intact bilaterally.  Bilateral UE tremors worse with movement.  No focal deficits.  Psych: AOx3.  Flat affect.      Data Review (Labs):   Recent Results (from the past 24 hour(s))   MAGNESIUM    Collection Time: 03/03/15  5:05 AM   Result Value Ref Range    Magnesium 1.9 1.8 - 2.4 mg/dL   PHOSPHORUS    Collection Time: 03/03/15  5:05 AM   Result Value Ref Range    Phosphorus 4.0 2.5 - 4.5 MG/DL   METABOLIC PANEL, COMPREHENSIVE    Collection Time: 03/03/15  5:05 AM   Result Value Ref Range    Sodium 141 136 - 145 mmol/L    Potassium 3.4 (L) 3.5 - 5.1 mmol/L    Chloride 108 (H) 98 - 107 mmol/L    CO2 23 21 - 32 mmol/L    Anion gap 10 7 - 16 mmol/L    Glucose 135 (H) 65 - 100 mg/dL    BUN 24 (H) 6 - 23 MG/DL    Creatinine 0.76 (L) 0.8 - 1.5 MG/DL    GFR est AA >60 >60 ml/min/1.23m    GFR est non-AA >60 >60 ml/min/1.741m   Calcium 8.3 8.3 - 10.4 MG/DL    Bilirubin, total 0.6 0.2 - 1.1 MG/DL    ALT 175 (H) 12 - 65 U/L    AST 120 (H) 15 - 37 U/L    Alk. 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

## 2015-03-03 NOTE — Progress Notes (Signed)
Problem: Mobility Impaired (Adult and Pediatric)  Goal: *Acute Goals and Plan of Care (Insert Text)  Discharge Goals:  (1.)Brett Becker will move from supine to sit and sit to supine , scoot up and down and roll side to side with INDEPENDENT within 7 day(s).   (2.)Brett Becker will transfer from bed to chair and chair to bed with MODIFIED INDEPENDENCE using the least restrictive device within 7 day(s).   (3.)Brett Becker will ambulate with MODIFIED INDEPENDENCE for 150+ feet with the least restrictive device within 7 day(s).   (4.)Brett Becker will tolerate 25+ minutes of therapeutic activity/exercise while maintaining stable vitals to improve functional strength and endurance within 7 day(s).  (5.)Brett Becker will demonstrate good dynamic standing balance to safely perform functional mobility and ambulation within 7 day(s).  ________________________________________________________________________________________________  PHYSICAL THERAPY: Daily Note, Treatment Day: 2nd and AM  INPATIENT: Medicaid : Hospital Day: 8    NAME/AGE/GENDER: Brett Becker is a 54 y.o. male  DATE: 03/03/2015  PRIMARY DIAGNOSIS: Delirium tremens ([place])  Delirium tremens ([place])  Delirium tremens ([place])        ICD-10: Treatment Diagnosis: Unsteadiness on feet; Unspecified lack of coordination   INTERDISCIPLINARY COLLABORATION: Physical Therapy Assistant, Occupational Therapist and Registered Nurse  ASSESSMENT:   Brett Becker presents supine in bed, oriented to time, full name, birthday and intermittently place but perseverating on I am a "confederate soldier." Patient performed supine to sitting with minimal to moderate assistance, scooting with moderate assistance, and sit to stand x5 with moderate assistance x2. Patient demonstrate improved sitting balance today with fair statically and dynamic with improved posterior lean. Strong posterior lean continues in standing with poor dynamic balance despite  verbal and manual cues for posture and unable to initiate stepping. Tolerated sitting edge of bed x30+ minutes to address dynamic sitting balance activity as well as postural training with reaching outside of base of support. Left supine in bed, UE restraints intact, and head of bed elevated 75+ degrees and positioned to comfort. Good progress today, overall requiring less assistance with mobility. Continues to demonstrate decreased functional strength, safety, and mobility from baseline. Will continue to follow and progress toward stated goals.     ????????This section established at most recent assessment??????????  PROBLEM LIST (Impairments causing functional limitations):  1. Decreased Strength effecting function  2. Decreased ADL/Functional Activities  3. Decreased Transfer Abilities  4. Decreased Ambulation Ability/Technique  5. Decreased Balance  6. Decreased Activity Tolerance  7. Increased Fatigue effecting function  8. Increased Shortness of Breath affecting function  9. Decreased Knowledge of precautions  10. Decreased Independence with Home Exercise Program  REHABILITATION POTENTIAL FOR STATED GOALS: GOOD      PLAN OF CARE:   INTERVENTIONS PLANNED: (Benefits and precautions of physical therapy have been discussed with the patient.)  1. balance exercise  2. bed mobility  3. family education  4. gait training  5. neuromuscular re-education/strengthening  6. range of motion: active/assisted/passive  7. therapeutic activities  8. therapeutic exercise/strengthening  9. transfer training  FREQUENCY/DURATION: Follow patient 1-2 times per day/4-7 days per week until goals are met in order to address above goals.    RECOMMENDED REHABILITATION/EQUIPMENT: (at time of discharge pending progress):   TBD pending patient progress. Continue skilled acute PT services..  SUBJECTIVE:   "I am a confederate soldier."    Present Symptoms: endorses "no" 0/10 pain   Pain Intensity 1: 0      History of Present Injury/Illness: Per chart:  Patient is a 54 y.o.  white  male who presents with altered mental status.  Pt has prior admission by me for DT's in 2014.  He was brought to the ED by a friend due to diminished responsiveness and seizure at home.  No family or friends present at this time.  Pt able to give limited history due to lethargy.  He denies ever having had a seizure in his life, though he has had documented seizures previously.  Pt states he normally drinks 6-8 beers per day and hadn't been drinking as much today.  He denies chest pain, dyspnea, nausea, vomiting, abdominal pain.  He reports normally having a tremor, but it is worse at the present moment.  He also reports to me that he saw a red ball bouncing around his house today, but knew that it wasn't actually there.      Past Medical History    Diagnosis  Date    ???  Delirium tremens ([place])  September, 2014        Admitted with DTs and seizure activity.  Found to have GI bleeding secondary to gastric ullcers.    ???  Gastric ulcer  September, 2014        Found at EGD to have major gastric ulcers.          Past Surgical History    Procedure  Laterality  Date    ???  Pr neurological procedure unlisted        ???  Hx back surgery            x 2    ???  Hx endoscopy    September, 2014              Prior Level of Function/Home Situation:   History provided by patient/chart; patient is a poor historian at time of initial assessment.  Patient live alone in a private residence/apartment.  At baseline, independent with ADLs and ambulates with a straight cane.  Home Environment: Private residence  One/Two Story Residence: One story  Living Alone: Yes  Support Systems: Friends \\ neighbors  Patient Expects to be Discharged to:: Apartment  Current DME Used/Available at Home: Cane, straight, Environmental consultant, rolling  OBJECTIVE/TREATMENT:   (In addition to Assessment/Re-Assessment sessions the following treatments were rendered)                                       [facility]??? ???6 Clicks???                                          Basic Mobility Inpatient Short Form  How much difficulty does the patient currently have... Unable A Lot A Little None   1.  Turning over in bed (including adjusting bedclothes, sheets and blankets)?   '[ ]'  1   '[ ]'  2   '[X]'  3   '[ ]'  4   2.  Sitting down on and standing up from a chair with arms ( e.g., wheelchair, bedside commode, etc.)   '[ ]'  1   '[X]'  2   '[ ]'  3   '[ ]'  4   3.  Moving from lying on back to sitting on the side of the bed?   '[ ]'  1   '[ ]'  2   '[X]'  3   '[ ]'  4  How much help from another person does the patient currently need... Total A Lot A Little None   4.  Moving to and from a bed to a chair (including a wheelchair)?   '[ ]'  1   '[X]'  2   '[ ]'  3   '[ ]'  4   5.  Need to walk in hospital room?   '[ ]'  1   '[X]'  2   '[ ]'  3   '[ ]'  4   6.  Climbing 3-5 steps with a railing?   '[ ]'  1   '[X]'  2   '[ ]'  3   '[ ]'  4   ?? 2007, [employer], under license to [employer]. All rights reserved       Score:  Initial: 14 Most Recent: 14 (Date: 02/25/14 )   Interpretation of Tool:  Represents activities that are increasingly more difficult (i.e. Bed mobility, Transfers, Gait).  Score 24 23 22-20 19-15 14-10 9-7 6   Modifier CH CI CJ CK CL CM CN       ?? Mobility - Walking and Moving Around:              K1601 - CURRENT STATUS:       CL - 60%-79% impaired, limited or restricted              U9323 - GOAL STATUS:               CJ - 20%-39% impaired, limited or restricted              F5732 - D/C STATUS:                    ---------------To be determined---------------  Payor: ADVICARE MEDICAID / Plan: [place] ADVICARE MEDICA[place] / Product Type: Managed Care Medicaid /    Most Recent Physical Functioning:   Gross Assessment:  AROM: Within functional limits (B LE)  Strength: Generally decreased, functional (4-/5 B LE functionally)  Coordination: Generally decreased, functional               Posture:   Posture (WDL): Exceptions to WDL  Posture Assessment: Forward head, Rounded shoulders, Kyphosis  Balance:  Sitting: Impaired  Sitting - Static: Fair (occasional)  Sitting - Dynamic: Fair (occasional)  Standing: Impaired  Standing - Static: Poor  Standing - Dynamic : Poor  Bed Mobility:  Rolling: Moderate assistance  Supine to Sit: Moderate assistance  Sit to Supine: Minimum assistance  Interventions: Safety awareness training;Tactile cues;Verbal cues;Visual cues  Wheelchair Mobility:     Transfers:  Sit to Stand: Moderate assistance;Assist x2  Stand to Sit: Moderate assistance;Assist x2  Gait:            Therapeutic Activity: (    40 minutes):  Therapeutic activities including bed mobility, supine to sit and sit to stand transfer training, static and dynamic sitting balance training, static standing balance training, safety awareness training, and patient education to improve mobility, strength and balance.  Required minimal to moderate assistance with mobility as well as verbal/tactile and manual cueing   to promote static balance in sitting .    Braces/Orthotics/Lines/Etc:   ?? PICC  Safety:   After treatment position/precautions:  ?? Supine in bed, Bed in low position, Call light within reach, RN notified, Restraints and Side rails x 3  Progression/Medical Necessity:   ?? Patient demonstrates good rehab potential due to higher previous functional level.  Compliance with Program/Exercises: Will assess as treatment  progresses.   Reason for Continuation of Services/Other Comments:  ?? Patient continues to require skilled intervention due to decreased functional mobility, coordination, and balance/gait status from baseline..  Recommendations/Intent for next treatment session: Treatment next visit will focus on advancements to more challenging activities and reduction in assistance provided.    Total Treatment Duration:  Time In: 0920  Time Out: [place], PT

## 2015-03-03 NOTE — Progress Notes (Addendum)
Problem: Dysphagia (Adult)  Goal: *Acute Goals and Plan of Care (Insert Text)  STG: Pt will complete laryngeal exercises x10 with 80% accuracy (goal revised 03/03/15)  STG: Pt will participate with modified barium swallow study x1 (goal met 03/03/15)  LTG: Pt will tolerate the least restrictive diet at discharge without respiratory compromise    SPEECH LANGUAGE PATHOLOGY: MODIFIED BARIUM SWALLOW STUDY: INITIAL ASSESSMENT    NAME/AGE/GENDER: Brett Becker is a 54 y.o. male  DATE: 03/03/2015  PRIMARY DIAGNOSIS: Delirium tremens ([place])       ICD-10: Treatment Diagnosis: dysphagia; oropharyngeal R13.12  INTERDISCIPLINARY COLLABORATION: raddiologist  PRECAUTIONS/ALLERGIES: Codeine   ASSESSMENT/PLAN OF CARE:   Based on the objective data described below, Mr. Blough presents with severe dysphagia.  Pooling to the pyriform sinuses with all consistencies.  Aspiration prior to the swallow with a trace amount of thin barium from the spoon and aspiration again during the swallow with the same bolus.  Aspiration during the swallow with nectar and honey by spoon.  Delayed, ineffective cough elicited.  The patient had large amount of vallecular residue after the initial swallow of pudding and he did not follow directions to elicit a second swallow to clear.  Additional trials deferred.  Recommend strict NPO.  Will follow for dysphagia tx.      Patient will benefit from skilled intervention to address the below impairments.  ????????This section established at most recent assessment??????????  RECOMMENDATIONS AND PLANNED INTERVENTIONS (Benefits and precautions of therapy have been discussed with the patient.):  ?? NPO with alternative means of nutrition  MEDICATIONS:  ?? Non-oral  COMPENSATORY STRATEGIES/MODIFICATIONS INCLUDING:  ?? N/a  OTHER RECOMMENDATIONS (including follow up treatment recommendations):   ?? Tongue based expressions  ?? Family training/education  ?? Laryngeal exercises  ?? Patient education   FREQUENCY/DURATION: Continue to follow patient 3-5x a week to address above goals.   RECOMMENDED REHABILITATION/EQUIPMENT: (at time of discharge pending progress):   to be determined.      SUBJECTIVE:   Oriented to person only.  History of Present Injury/Illness: Brett Becker  has a past medical history of Delirium tremens [facility]) (September, 2014) and Gastric ulcer (September, 2014).  He also  has past surgical history that includes neurological procedure unlisted; back surgery; and endoscopy (September, 2014).  Present Symptoms: cough with po  Pain Intensity 1: 0  Pain Location 1: Hand  Pain Orientation 1: Left, Right  Pain Intervention(s) 1: MD notified (comment) (paging MD)    Current Dietary Status:  NPO           ?? History of reflux:  no  Social History/Home Situation: home alone  Home Environment: Private residence  One/Two Story Residence: One story  Living Alone: Yes  Support Systems: Friends \\ neighbors  Patient Expects to be Discharged to:: Apartment  Current DME Used/Available at Home: Cane, straight, Environmental consultant, rolling      OBJECTIVE:       Cognitive/Communication Status:  Mental Status  Neurologic State: Alert  Orientation Level: Oriented to person, Disoriented to place, Disoriented to situation, Disoriented to time  Cognition: Decreased attention/concentration  Perception: Appears intact  Perseveration: No perseveration noted  Safety/Judgement: Decreased awareness of environment, Decreased insight into deficits    Oral Assessment:  Oral Assessment  Labial: No impairment  Dentition: Limited, Natural  Lingual: Decreased rate  Mandible: No impairment  Gag Reflex: No impairment    Vocal Quality: WFL    Patient Viewed: Patient Position: upright in bed  Film Views: Lateral, Fluoro  Oral Prepatory:  The patient was given the following: Consistency Presented: Thin liquid, Nectar thick liquid, Honey thick liquid, Pudding  How Presented: Self-fed/presented, Spoon    Oral Phase:  Bolus Acceptance: Impaired   Bolus Formation/Control: Impaired  Propulsion: Delayed (# of seconds), Discoordination  Type of Impairment: Delayed, Premature spillage     Initiation of Swallow: Triggered at pyriform sinus(es)  Oral Phase Severity: Mild-moderate    Pharyngeal Phase:  Timing: Prolonged pooling 11-29 sec, Response >30 sec, Pooling 6-10 sec  Decreased Tongue Base Retraction?: Yes  Laryngeal Elevation: Inadequate epiglottic inversion, Incomplete laryngeal closure, Reduced excursion with laryngeal vestibule gap     Aspiration/Timing: Repeated, During, Before  Aspiration/Penetration Score: 7 (Aspiration-Contrast passes below the cords/, but is not ejected despite attempt)  Pharyngeal Symmetry: Not assessed  Pharyngeal Dysfunction: Decreased pharyngeal wall constriction, Decreased elevation/closure, Decreased tongue base retraction  Pharyngeal Phase Severity: Severe  Pharyngeal-Esophageal Segment: No impairment    Assessment/Reassessment only, no treatment provided today    Tool Used: Dysphagia Outcome and Severity Scale (DOSS)     Score Comments   Normal Diet  '[ ]'  7 With no strategies or extra time needed        Functional Swallow  '[ ]'  6 May have mild oral or pharyngeal delay        Mild Dysphagia     '[ ]'  5 Which may require one diet consistency restricted (those who demonstrate penetration which is entirely cleared on MBS would be included)   Mild-Moderate Dysphagia  '[ ]'  4 With 1-2 diet consistencies restricted        Moderate Dysphagia  '[ ]'  3 With 2 or more diet consistencies restricted        Moderately Severe Dysphagia  '[ ]'  2 With partial PO strategies (trials with ST only)        Severe Dysphagia  '[X]'  1 With inability to tolerate any PO safely           Score:  Initial: 1 Most Recent: X (Date: -- )   Interpretation of Tool: The Dysphagia Outcome and Severity Scale (DOSS) is a simple, easy-to-use, 7-point scale developed to systematically rate the functional severity of dysphagia based on objective assessment and make  recommendations for diet level, independence level, and type of nutrition.       Score '7 6 5 4 3 2 1   ' Modifier CH CI CJ CK CL CM CN   ?? Swallowing:              H0623 - CURRENT STATUS:           CN - 100% impaired, limited or restricted              J6283 - GOAL STATUS:                   CL - 60%-79% impaired, limited or restricted              T5176 - D/C STATUS:                       ---------------To be determined---------------  Payor: ADVICARE MEDICAID / Plan: [place] ADVICARE MEDICA[place] / Product Type: Managed Care Medicaid /   __________________________________________________________________________________________________  Safety:   After treatment position/precautions:  ?? Upright in Bed  Recommendations for treatment: laryngeal exercises  Total Treatment Duration:  Time In: 1140   Time Out: 1210     Serita Grammes MS, CCC-SLP

## 2015-03-03 NOTE — Progress Notes (Signed)
SPEECH PATHOLOGY NOTE:    Modified barium swallow study completed.  Aspiration of thin, nectar, and honey by spoon.  Recommend NPO.  Full report to follow.    Reginia NaasShannon Ferrill MS, CCC-SLP

## 2015-03-03 NOTE — Progress Notes (Signed)
Care Management Interventions  Physical Therapy Consult: Yes  Occupational Therapy Consult: Yes  Speech Therapy Consult: Yes  Current Support Network: Own Home, Lives Alone, Family Lives Nearby (pt has a friend that lives next door that helps sometimes)  Discharge Location  Discharge Placement: Skilled nursing facility    Pt is a [age] yo male admitted due to a seizure and DT's.  SW spoke with Brett Becker, pt's aunt, via phone.  She informed SW that PTA pt lived alone in his own apartment and had done so for about 6 years.  She stated that he was independent but has a neighbor who would check in on him.  Pt has a daughter, Brett Becker, but she does not have her contact number.  She stated she would try to get the number and call SW back with it or will have the pt's dtr to contact SW directly.  Pt has a medicaid HMO that will require pre-authorization for a SNF placement.  Pt will also require CLTC/Medicaid level of care approval for SNF as well.  Once SW speaks with pt's dtr, SW will make referrals to SNF's.  PPD placed today. SW following.

## 2015-03-03 NOTE — Progress Notes (Signed)
Problem: Nutrition Deficit  Goal: *Optimize nutritional status  Nutrition F/U: Management of TPN (Dr. Trudi Idarew-hospitalist)   Assessment:   ?? Remains NPO per MBS this AM. Abdomen WDL, date of last BM 3-18. Therefore could use GI tract for nutrition if able to obtain and maintain access given his AMS  ?? Currently receiving TPN via PICC line  ?? K 3.4-K increase in TPN last night from ~50 meq/d to ~84 meq/d last night. Would benefit from bolus today and increased K in TPN  ?? Na 141-WNL, Na increased to 150 meq/L of TPN last night  ?? BMP BS of 135 mg/dL-improved with decreased dose of Solu-Medrol. Will defer SQBS and SSI until BMP BS consistently over 180 mg/dL Pertinent Meds: PO Stress tab with zinc amd Mg oxide-being held d/t NPO status. Current TPN contains Mg, MVI and MTE  Macronutrient needs:   EER: 1557-1816 kcal /day (30-35 kcal/kg Actual BW)   EPR: 62-72 grams protein/day (1.2-1.4 grams/kg IBW)   Max CHO: 299 g CHO/day (4 mg/kg Actual BW/min)   Fluid: 1 mL/kcal or per MD   Intake/Comparative Standards: Current TPN:DEX 10%/4.25% AA at 70 mL/hr with 250 mL daily lipids provides 1357 kcals (87% of needs), 71 g protein (100% of needs), 168 g dextrose (does not exceed max limit) and 1930 mL fluid (>100% of needs).  Intervention:   1. Meals and snacks: NPO.  2. Nutrition supplement therapy: Electrolyte protocols are active on MAR. Supplement K per protocols.  3. EN: Consider placement of NGT/TF for access for TF.  4. Parenteral Nutrition: Discontinue po stress tab with zinc since TPN contains MVI and MTE. Discontinue MgOxide.   Change TPN to DEX 15%/5% AA at 60 mL/hr with 250 mL daily lipids. This supplies 1522 kcals (98% of needs), 72 g protein (100% of needs), 216 g dextrose (does not exceed max limit) and 1690 mL fluid (100% of needs).   Increase K to 65 mEq/L or ~94 meq/d   Remove addtional Folic Acid and 100 mg Thiamine from TPN as pt has received for > 3 days.    Harlow MaresAmy Idonna Heeren, RD, LD, CNSC [phone]

## 2015-03-03 NOTE — Progress Notes (Signed)
Problem: Self Care Deficits Care Plan (Adult)  Goal: *Acute Goals and Plan of Care (Insert Text)  1. Patient will feed self entire meal with set-up.   2. Patient will complete grooming with set-up.   3. Patient will tolerate 15 minutes of OT treatment with no rest breaks to increase activity tolerance for ADLs.   4. Patient will complete functional transfers with minimal assistance and adaptive equipment as needed.   5. Patient will demonstrate fair standing balance to increase safety during standing ADLs.     Timeframe: 7 visits   OCCUPATIONAL THERAPY: Daily Note and Treatment Day: 3rd  INPATIENT: Medicaid : Hospital Day: 8    NAME/AGE/GENDER: Brett Becker is a 54 y.o. 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 Patient supine in bed upon arrival asleep. 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 Attempted to reorient patient to place, situation, and time. He needs additional time and cues to follow commands due to slowed processing. We worked on donning socks, brushing hair, and toileting/ brief change. See below for assistance required. BUE are weak and patient needs proximal support for shoulder flexion during functional tasks like hair brushing. Also needs some hand over hand assistance due to impaired coordination. Attempted standing several times this session and patient continues to have strong posterior lean. Slow progress. Still requiring maximal to total assistance for ADLs. Will continue efforts.        ????????This section established at most recent assessment??????????  PROBLEM LIST (Impairments causing functional limitations):  1. Decreased Strength effecting function  2. Decreased ADL/Functional Activities  3. Decreased Transfer Abilities  4. Decreased Ambulation Ability/Technique  5. Decreased Balance  6. Decreased Activity Tolerance  7. Impaired balance affecting function  8. Impaired coordination affecting function  REHABILITATION POTENTIAL FOR STATED GOALS: GUARDED  PLAN OF CARE:  INTERVENTIONS PLANNED: (Benefits and precautions of occupational therapy have been discussed with the patient.)  1. Activities of daily living training  2. Adaptive equipment training  3. Cognitive training  4. Donning&doffing training  5. Group therapy  6. Theraputic activity  7. Theraputic exercise  FREQUENCY/DURATION: Follow patient 1-2 times per day/3-5 days per week until goals are met in order to address above goals.    RECOMMENDED REHABILITATION/EQUIPMENT: (at time of discharge pending progress):   Continue OT.  SUBJECTIVE:   "I'm a confederate soldier."    History of Present Injury/Illness: Per H&P, "Patient is a 54 y.o. white  male who presents with altered mental status.  Pt has prior admission by me for DT's in 2014.  He was brought to the ED by a friend due to diminished responsiveness and seizure at home.  No family or friends present at this time.  Pt able to give limited history due to lethargy.  He denies ever having had a seizure in his life, though he has had documented seizures previously.  Pt states he normally drinks 6-8 beers per day and hadn't been drinking as much today.  He denies chest pain, dyspnea, nausea, vomiting, abdominal pain.  He reports normally having a tremor, but it is worse  at the present moment.  He also reports to me that he saw a red ball bouncing around his house today, but knew that it wasn't actually there."  Present Symptoms: confusion    Pain Intensity 1: 0   Prior Level of Function/Home Situation: Patient is a poor historian but reports that he lives with his friend. He says he is independent with ADLs. He uses a bike for communication and does not drive.    Home Environment: Private residence  One/Two Story Residence: One story  Living Alone: Yes  Support Systems: Friends \\ neighbors  Patient Expects to be Discharged to:: Apartment  Current DME Used/Available at Home: Cane, straight, Environmental consultant, rolling  OBJECTIVE/TREATMENT:   Self Care: (25 minutes): Procedure(s) (per grid) utilized to improve and/or restore self-care/home management as related to dressing, toileting and grooming. Required maximal verbal and manual cueing to facilitate activities of daily living skills and proximal support and hand over hand for fine motor assist.  Cognitive Skills Development: (13 minutes): Procedure(s) (per grid) utilized to improve and/or restore cognitive functioning as related to ability to follow simple commands, attention to tasks, problem solving skills, self awareness and orientation to place, situation, and time.Marland Kitchen Required minimal to moderate verbal cueing to facilitate attention to task.                                                    [facility]	[place] [facility] "6 Clicks"                                                       Daily Activity Inpatient Short Form  How much help from another person does the patient currently need... Total A Lot A Little None   1.  Putting on and taking off regular lower body clothing?   '[X]'  1   '[ ]'  2   '[ ]'  3   '[ ]'  4   2.  Bathing (including washing, rinsing, drying)?   '[X]'  1   '[ ]'  2   '[ ]'  3   '[ ]'  4   3.  Toileting, which includes using toilet, bedpan or urinal?   '[X]'  1   '[ ]'  2   '[ ]'  3   '[ ]'  4   4.  Putting on and taking off regular upper body clothing?   '[ ]'  1   '[X]'  2   '[ ]'  3   '[ ]'  4   5.  Taking care of personal grooming such as brushing teeth?   '[ ]'  1   '[X]'  2   '[ ]'  3   '[ ]'  4   6.  Eating meals?   '[ ]'  1   '[X]'  2   '[ ]'  3   '[ ]'  4    ?? 2007, [employer], under license to [employer]. All rights reserved       Score:  Initial: 9 Most Recent: X (Date: -- )   Interpretation of Tool:  Represents clinically-significant functional categories (i.e.Activities of daily living).  Score 24 23 22-20 19-14 13-9 8-7 6   Modifier CH CI CJ CK CL CM CN       ??  Self Care:              [phone] - CURRENT STATUS:           CL - 60%-79% impaired, limited or restricted              S0630 - GOAL STATUS:                   CK - 40%-59% impaired, limited or restricted              Z6010 - D/C STATUS:                       ---------------To be determined---------------  Payor: ADVICARE MEDICAID / Plan: [place]ICA[place] / Product Type: Managed Care Medicaid /      Balance  Sitting: Impaired  Sitting - Static: Fair (occasional)  Sitting - Dynamic: Fair (occasional)  Standing: Impaired  Standing - Static: Poor  Standing - Dynamic : Poor       Patient Vitals for the past 6 hrs:   BP BP Patient Position SpO2 O2 Flow Rate (L/min) Pulse   03/03/15 0825 - - 91 % 0 l/min 81   03/03/15 0920 - - 92 % - -   03/03/15 1159 177/90 mmHg At rest 92 % - 99                           Most Recent Physical Functioning:   Gross Assessment:  AROM: Generally decreased, functional (BUE)  Strength: Generally decreased, functional (BUE)  Coordination: Generally decreased, functional (BUE)               Posture:     Balance:     Bed Mobility:     Wheelchair Mobility:     Transfers:        Mental Status  Neurologic State: Alert  Orientation Level: Oriented to person;Disoriented to place;Disoriented to situation;Disoriented to time  Cognition: Decreased attention/concentration  Perseveration: No perseveration noted  Safety/Judgement: Decreased awareness of environment;Decreased insight into deficits      Basic ADLs (From Assessment) Complex ADLs (From Assessment)   Basic ADL  Feeding: Moderate assistance  Oral Facial Hygiene/Grooming: Moderate assistance   Bathing: Maximum assistance  Upper Body Dressing: Maximum assistance  Lower Body Dressing: Maximum assistance  Toileting: Total assistance Instrumental ADL  Meal Preparation: Total assistance  Homemaking: Total assistance  Medication Management: Total assistance  Financial Management: Total assistance   Grooming/Bathing/Dressing Activities of Daily Living   Grooming  Grooming Assistance: Maximum assistance  Brushing/Combing Hair: Maximum assistance;Proximal stability;Training to use affected extremity as a fine motor assistance Cognitive Retraining  Orientation Retraining: Reorienting;Place;Person;Situation;Time  Problem Solving: Deductive reason  Organizing/Sequencing: Breaking task down  Attention to Task: Distractibility  Following Commands: Follows one step commands/directions (needs additional time and cueing)  Safety/Judgement: Decreased awareness of environment;Decreased insight into deficits  Cues: Tactile cues provided;Verbal cues provided;Visual cues provided           Toileting  Toileting Assistance: Maximum assistance  Bladder Hygiene: Minimum assistance  Clothing Management: Maximum assistance         Lower Body Dressing Assistance  Underpants: Maximum assistance  Socks: Maximum assistance;Proximal stability;Training to use affected extremity as a fine motor assistance  Leg Crossed Method Used: No  Position Performed: Seated edge of bed  Cues: Don;Doff;Physical assistance;Verbal cues provided;Tactile cues provided Bed/Mat Mobility  Rolling: Moderate assistance  Supine to Sit: Moderate assistance  Sit to Supine: Minimum assistance  Sit to Stand: Moderate assistance;Assist x2          Braces/Orthotics/Lines/Other:   ?? Hand Dominance: unknown  ?? IV  ?? O2 Device: Nasal cannula    Safety:   After treatment position/precautions:  ?? Supine in bed, Bed alarm/tab alert on, Bed/Chair-wheels locked, Bed in low position, Call light within reach, RN notified and Restraints    Progression/Medical Necessity:    ?? Patient demonstrates fair rehab potential due to higher previous functional level.  Compliance with Program/Exercises: noncompliant some of the time.   Reason for Continuation of Services/Other Comments:  ?? Patient continues to require present interventions due to patient's inability to care for self without assistance.  Recommendations/Intent for next treatment session: Treatment next visit will focus on advancements to more challenging activities and reduction in assistance provided.  Total Treatment Duration:  Time In: 0920  Time Out: [address] [place], OTR/L

## 2015-03-04 ENCOUNTER — Inpatient Hospital Stay: Admit: 2015-03-04 | Payer: MEDICAID | Primary: Family Medicine

## 2015-03-04 ENCOUNTER — Inpatient Hospital Stay: Payer: MEDICAID | Primary: Family Medicine

## 2015-03-04 ENCOUNTER — Encounter (INDEPENDENT_AMBULATORY_CARE_PROVIDER_SITE_OTHER): Payer: Self-pay | Admitting: Internal Medicine

## 2015-03-04 LAB — METABOLIC PANEL, BASIC
Anion gap: 6 mmol/L — ABNORMAL LOW (ref 7–16)
BUN: 24 MG/DL — ABNORMAL HIGH (ref 6–23)
CO2: 26 mmol/L (ref 21–32)
Calcium: 8.3 MG/DL (ref 8.3–10.4)
Chloride: 109 mmol/L — ABNORMAL HIGH (ref 98–107)
Creatinine: 0.64 MG/DL — ABNORMAL LOW (ref 0.8–1.5)
GFR est AA: >60 mL/min/{1.73_m2} (ref >60)
GFR est non-AA: >60 mL/min/{1.73_m2} (ref >60)
Glucose: 145 mg/dL — ABNORMAL HIGH (ref 65–100)
Potassium: 3.9 mmol/L (ref 3.5–5.1)
Sodium: 141 mmol/L (ref 136–145)

## 2015-03-04 LAB — PLEASE READ & DOCUMENT PPD TEST IN 24 HRS
PPD: NEGATIVE Negative
mm Induration: 0 mm

## 2015-03-04 MED ORDER — LIP PROTECTANT 0.6 %-0.5 %-1 %-0.5 % OINTMENT
CUTANEOUS | Status: DC | PRN
Start: 2015-03-04 — End: 2015-03-17

## 2015-03-04 MED ORDER — LORAZEPAM 2 MG/ML IJ SOLN
2 mg/mL | Freq: Three times a day (TID) | INTRAMUSCULAR | Status: DC
Start: 2015-03-04 — End: 2015-03-12
  Administered 2015-03-04 – 2015-03-12 (×25): via INTRAVENOUS

## 2015-03-04 MED FILL — MONOJECT PREFILL ADVANCED (PF) 100 UNIT/ML INTRAVENOUS SYRINGE: 100 unit/mL | INTRAVENOUS | Qty: 6

## 2015-03-04 MED FILL — LORAZEPAM 2 MG/ML IJ SOLN: 2 mg/mL | INTRAMUSCULAR | Qty: 1

## 2015-03-04 MED FILL — NICOTINE 21 MG/24 HR DAILY PATCH: 21 mg/24 hr | TRANSDERMAL | Qty: 1

## 2015-03-04 MED FILL — LIP PROTECTANT 0.6 %-0.5 %-1 %-0.5 % OINTMENT: CUTANEOUS | Qty: 1

## 2015-03-04 MED FILL — LEVETIRACETAM 500 MG/5 ML IV SOLN: 500 mg/5 mL | INTRAVENOUS | Qty: 5

## 2015-03-04 MED FILL — CLINIMIX 5 % IN 15 % DEXTROSE SULFITE FREE INTRAVENOUS SOLUTION: 5 % | INTRAVENOUS | Qty: 2000

## 2015-03-04 MED FILL — SOLU-MEDROL (PF) 40 MG/ML SOLUTION FOR INJECTION: 40 mg/mL | INTRAMUSCULAR | Qty: 1

## 2015-03-04 MED FILL — INTRALIPID 20 % INTRAVENOUS EMULSION: 20 % | INTRAVENOUS | Qty: 250

## 2015-03-04 MED FILL — LOVENOX 40 MG/0.4 ML SUBCUTANEOUS SYRINGE: 40 mg/0.4 mL | SUBCUTANEOUS | Qty: 0.4

## 2015-03-04 NOTE — Progress Notes (Signed)
Problem: Dysphagia (Adult)  Goal: *Acute Goals and Plan of Care (Insert Text)  STG: Pt will complete laryngeal exercises x10 with 80% accuracy (goal revised 03/03/15)  STG: Pt will participate with modified barium swallow study x1 (goal met 03/03/15)  LTG: Pt will tolerate the least restrictive diet at discharge without respiratory compromise    SPEECH LANGUAGE PATHOLOGY: BEDSIDE SWALLOW STUDY: Daily Note 1    NAME/AGE/GENDER: Brett Becker is a 54 y.o. 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  Limited ability to participate.  Restraints have been removed.  Confused.  Completed falsettos x5 and hard glottal attacks x5.  Patient with limited awareness of swallowing impairment stating "it's too early.  I need my coffee."  Re-educated on results of modified barium swallow study (MBS) and risks with po intake right now.  No attempts to complete exercises requiring a dry swallow.  Wet vocal quality with strong coughing episode on his saliva during the session.  Currently on TPN right now.  Recommend continue NPO due to severe dysphagia.  Will attempt dysphagia treatment but current cognitive status is a major barrier.      Patient will benefit from skilled intervention to address the below impairments.  ????????This section established at most recent assessment??????????  RECOMMENDATIONS AND PLANNED INTERVENTIONS (Benefits and precautions of therapy have been discussed with the patient.):  ?? NPO with alternative means of nutrition  MEDICATIONS:  ?? Non-oral  COMPENSATORY STRATEGIES/MODIFICATIONS INCLUDING:  ?? N/a  OTHER RECOMMENDATIONS (including follow up treatment recommendations):   ?? Tongue based expressions  ?? Family training/education  ?? Laryngeal exercises  ?? Patient education   FREQUENCY/DURATION: Continue to follow patient 3-5x a week to address above goals.   RECOMMENDED REHABILITATION/EQUIPMENT: (at time of discharge pending progress):   to be determined.      SUBJECTIVE:   Decreased command following.  History of Present Injury/Illness: Brett Becker  has a past medical history of Delirium tremens [facility]) (September, 2014) and Gastric ulcer (September, 2014).  He also  has past surgical history that includes neurological procedure unlisted; back surgery; and endoscopy (September, 2014).  Present Symptoms: cough with po  Pain Intensity 1: 0  Pain Location 1: Hand  Pain Orientation 1: Left, Right  Pain Intervention(s) 1: MD notified (comment) (paging MD)    Current Dietary Status:  NPO           ?? History of reflux:  no  Social History/Home Situation: home alone  Home Environment: Private residence  One/Two Story Residence: One story  Living Alone: Yes  Support Systems: Friends \\ neighbors  Patient Expects to be Discharged to:: Apartment  Current DME Used/Available at Home: Cane, straight, Environmental consultant, rolling      OBJECTIVE:       Cognitive/Communication Status:  Mental Status  Neurologic State: Confused, Alert  Orientation Level: Disoriented to place, Disoriented to situation, Oriented to person, Oriented to time  Cognition: Impaired decision making, Decreased attention/concentration  Perception: Appears intact  Perseveration: No perseveration noted  Safety/Judgement: Decreased awareness of environment, Decreased insight into deficits    Oral Assessment:  Oral Assessment  Labial: No impairment  Dentition: Limited, Natural  Lingual: Decreased rate  Mandible: No impairment  Gag Reflex: No impairment  LARYNGEAL / PHARYNGEAL EXERCISES:           Hard Glottal  Attack: Yes  Reps : 5  Sets : 1                               Sing "EEE": Yes  Reps : 5  Sets : 1                            Dysphagia Activities: Activities/Procedures listed utilized to improve  progress in swallow function. Required maximal cueing to complete laryngeal exercises.    Tool Used: Dysphagia Outcome and Severity Scale (DOSS)     Score Comments   Normal Diet  '[ ]'  7 With no strategies or extra time needed        Functional Swallow  '[ ]'  6 May have mild oral or pharyngeal delay        Mild Dysphagia     '[ ]'  5 Which may require one diet consistency restricted (those who demonstrate penetration which is entirely cleared on MBS would be included)   Mild-Moderate Dysphagia  '[ ]'  4 With 1-2 diet consistencies restricted        Moderate Dysphagia  '[ ]'  3 With 2 or more diet consistencies restricted        Moderately Severe Dysphagia  '[ ]'  2 With partial PO strategies (trials with ST only)        Severe Dysphagia  '[X]'  1 With inability to tolerate any PO safely           Score:  Initial: 1 Most Recent: X (Date: -- )   Interpretation of Tool: The Dysphagia Outcome and Severity Scale (DOSS) is a simple, easy-to-use, 7-point scale developed to systematically rate the functional severity of dysphagia based on objective assessment and make recommendations for diet level, independence level, and type of nutrition.       Score '7 6 5 4 3 2 1   ' Modifier CH CI CJ CK CL CM CN   ?? Swallowing:              Z6109 - CURRENT STATUS:           CN - 100% impaired, limited or restricted              U0454 - GOAL STATUS:                   CL - 60%-79% impaired, limited or restricted              U9811 - D/C STATUS:                       ---------------To be determined---------------  Payor: ADVICARE MEDICAID / Plan: [place] ADVICARE MEDICA[place] / Product Type: Managed Care Medicaid /   __________________________________________________________________________________________________  Safety:   After treatment position/precautions:  ?? Upright in Bed  Recommendations for treatment: laryngeal exercises  Total Treatment Duration:  Time In: 0853   Time Out: 0910     Serita Grammes MS, CCC-SLP

## 2015-03-04 NOTE — Progress Notes (Signed)
SW spoke with pt's dtr, Alonna Bucklerita Nill [phone]) via phone.  Updated her on pt's progress and options at discharge that are being considered, i.e. SNF vs LTACH.  She is in agreement with whatever is recommended by the team and did not express a preference of facility. If pt is to remain on TPN this may limit pt's options as most SNF's do not take pt's on TPN.  LTACH may be the best option.  SW to continue to follow to assist as needed.

## 2015-03-04 NOTE — Progress Notes (Signed)
Problem: Self Care Deficits Care Plan (Adult)  Goal: *Acute Goals and Plan of Care (Insert Text)  1. Patient will feed self entire meal with set-up.   2. Patient will complete grooming with set-up.   3. Patient will tolerate 15 minutes of OT treatment with no rest breaks to increase activity tolerance for ADLs.   4. Patient will complete functional transfers with minimal assistance and adaptive equipment as needed.   5. Patient will demonstrate fair standing balance to increase safety during standing ADLs.     Timeframe: 7 visits   OCCUPATIONAL THERAPY: Daily Note, Treatment Day: 4th and AM  INPATIENT: Medicaid : Hospital Day: 9    NAME/AGE/GENDER: Brett Becker is a 54 y.o. male  DATE: 03/04/2015  PRIMARY DIAGNOSIS: Delirium tremens ([place])  Delirium tremens ([place])  Delirium tremens ([place])        ICD-10: Treatment Diagnosis:   Unspecified lack of coordination (R27.9)  Dizziness and giddiness (R42)  Muscle weakness (generalized) (M62.81)      INTERDISCIPLINARY COLLABORATION: Physical Therapist, Certified Occupational Therapy Assistant and Registered Nurse  ASSESSMENT:   Brett Becker was admitted for the above diagnosis. Pt presents supine upon arrival. Pt was able to complete bed mobility with mod assist today. At edge of bed, patient demonstrates poor sitting balance. Pt able to perform grooming tasks with min assist. Pt able to follow commands better today. Pt was brought to therapy gym to participate in group exercises (in grid below) to increase UE strength and activity tolerance to perform mobility and ADLs. Pt required assistance and cueing to complete exercises. Pt tolerated session well. Returned to room by recliner. Will continue to benefit from skilled OT during stay.  ????????This section established at most recent assessment??????????  PROBLEM LIST (Impairments causing functional limitations):  1. Decreased Strength effecting function  2. Decreased ADL/Functional Activities  3. Decreased Transfer Abilities   4. Decreased Ambulation Ability/Technique  5. Decreased Balance  6. Decreased Activity Tolerance  7. Impaired balance affecting function  8. Impaired coordination affecting function  REHABILITATION POTENTIAL FOR STATED GOALS: GUARDED  PLAN OF CARE:  INTERVENTIONS PLANNED: (Benefits and precautions of occupational therapy have been discussed with the patient.)  1. Activities of daily living training  2. Adaptive equipment training  3. Cognitive training  4. Donning&doffing training  5. Group therapy  6. Theraputic activity  7. Theraputic exercise  FREQUENCY/DURATION: Follow patient 1-2 times per day/3-5 days per week until goals are met in order to address above goals.    RECOMMENDED REHABILITATION/EQUIPMENT: (at time of discharge pending progress):   Continue OT.  SUBJECTIVE:   "I will try"    History of Present Injury/Illness: Per H&P, "Patient is a 54 y.o. white  male who presents with altered mental status.  Pt has prior admission by me for DT's in 2014.  He was brought to the ED by a friend due to diminished responsiveness and seizure at home.  No family or friends present at this time.  Pt able to give limited history due to lethargy.  He denies ever having had a seizure in his life, though he has had documented seizures previously.  Pt states he normally drinks 6-8 beers per day and hadn't been drinking as much today.  He denies chest pain, dyspnea, nausea, vomiting, abdominal pain.  He reports normally having a tremor, but it is worse at the present moment.  He also reports to me that he saw a red ball bouncing around his house today, but knew that it  wasn't actually there."  Present Symptoms: confusion    Pain Intensity 1: 0  Prior Level of Function/Home Situation: Patient is a poor historian but reports that he lives with his friend. He says he is independent with ADLs. He uses a bike for communication and does not drive.    Home Environment: Private residence  One/Two Story Residence: One story   Living Alone: Yes  Support Systems: Friends \\ neighbors  Patient Expects to be Discharged to:: Apartment  Current DME Used/Available at Home: Cane, straight, Environmental consultant, rolling  OBJECTIVE/TREATMENT:   TEFL teacher: (17 minutes): Procedure(s) (per grid) utilized to improve and/or restore cognitive functioning as related to ability to follow simple commands, attention to tasks, problem solving skills, self awareness and orientation to place, situation, and time.Marland Kitchen Required minimal to moderate verbal cueing to facilitate attention to task.    Group Therapeutic Exercise: (20 minutes):  Exercises per grid below to improve mobility, strength, coordination and activity tolerance.  Required moderate visual, verbal and tactile cues to promote proper body alignment and promote proper body mechanics.  Progressed resistance and repetitions as indicated.    Exercises completed with yellow theraband Date:  03/04/2015 Date:   Date:     Activity/Exercise Parameters Parameters Parameters   Horizontal Shoulder Abd/Adduction 15 reps     Shoulder Flexion 10 reps     Elbow Flexion 15 reps     Punches 10 reps                                                                         [place] AM-PACTM "6 Clicks"                                                       Daily Activity Inpatient Short Form  How much help from another person does the patient currently need... Total A Lot A Little None   1.  Putting on and taking off regular lower body clothing?   _0  1   _1  2   _2  3   _3  4   2.  Bathing (including washing, rinsing, drying)?   _4  1   _5  2   _6  3   _7  4   3.  Toileting, which includes using toilet, bedpan or urinal?   _8  1   _9  2   _10  3   _11  4   4.  Putting on and taking off regular upper body clothing?   _12  1   _13  2   _14  3   _15  4   5.  Taking care of personal grooming such as brushing teeth?   _16  1   _17  2   _18  3   _19  4   6.  Eating meals?   _20  1   _21  2   _22  3   _23  4    ?? 2007, [employer], under license to [employer]. All rights reserved  Score:  Initial: 9 Most Recent: X (Date: -- )   Interpretation of Tool:  Represents clinically-significant functional categories (i.e.Activities of daily living).  Score 24 23 22-20 19-14 13-9 8-7 6   Modifier CH CI CJ CK CL CM CN       ?? Self Care:              [phone] - CURRENT STATUS:           CL - 60%-79% impaired, limited or restricted              G2542 - GOAL STATUS:                   CK - 40%-59% impaired, limited or restricted              H0623 - D/C STATUS:                       ---------------To be determined---------------  Payor: ADVICARE MEDICAID / Plan: [place]ICARE MEDICA[place] / Product Type: Managed Care Medicaid /      Balance  Sitting: Impaired  Sitting - Static: Poor (constant support)  Sitting - Dynamic: Poor (constant support)  Standing: Impaired  Standing - Static: Poor  Standing - Dynamic : Poor       Patient Vitals for the past 6 hrs:   BP BP Patient Position SpO2 Pulse   03/04/15 1211 (!) 138/96 mmHg Sitting 97 % 80                           Most Recent Physical Functioning:   Gross Assessment:  AROM: Generally decreased, functional (BUE)  Strength: Generally decreased, functional (BUE)  Coordination: Generally decreased, functional (BUE)               Posture:     Balance:  Sitting: Impaired  Sitting - Static: Poor (constant support)  Sitting - Dynamic: Poor (constant support)  Bed Mobility:  Supine to Sit: Moderate assistance  Scooting: Moderate assistance  Wheelchair Mobility:     Transfers:  Sit to Stand: Moderate assistance;Assist x2  Bed to Chair: Maximum assistance;Assist x2     Mental Status  Neurologic State: Alert  Orientation Level: Oriented to person  Cognition: Follows commands  Perception: Appears intact  Perseveration: No perseveration noted  Safety/Judgement: Decreased awareness of environment;Fall prevention      Basic ADLs (From Assessment) Complex ADLs (From Assessment)    Basic ADL  Feeding: Moderate assistance  Oral Facial Hygiene/Grooming: Moderate assistance  Bathing: Maximum assistance  Upper Body Dressing: Maximum assistance  Lower Body Dressing: Maximum assistance  Toileting: Total assistance Instrumental ADL  Meal Preparation: Total assistance  Homemaking: Total assistance  Medication Management: Total assistance  Financial Management: Total assistance   Grooming/Bathing/Dressing Activities of Daily Living   Grooming  Washing Face: Minimum assistance Cognitive Retraining  Safety/Judgement: Decreased awareness of environment;Fall prevention                       Bed/Mat Mobility  Supine to Sit: Moderate assistance  Sit to Stand: Moderate assistance;Assist x2  Bed to Chair: Maximum assistance;Assist x2  Scooting: Moderate assistance          Braces/Orthotics/Lines/Other:   ?? Hand Dominance: unknown  ?? IV  ?? O2 Device: Nasal cannula    Safety:   After treatment position/precautions:  ?? Up in chair, Bed alarm/tab alert on, Bed/Chair-wheels  locked and RN notified    Progression/Medical Necessity:   ?? Patient demonstrates fair rehab potential due to higher previous functional level.  Compliance with Program/Exercises: noncompliant some of the time.   Reason for Continuation of Services/Other Comments:  ?? Patient continues to require present interventions due to patient's inability to care for self without assistance.  Recommendations/Intent for next treatment session: Treatment next visit will focus on advancements to more challenging activities and reduction in assistance provided.  Total Treatment Duration:  Time In: 2725 (1050)  Time Out: 1010 (1110)  [employer] Shark, COTA

## 2015-03-04 NOTE — Progress Notes (Signed)
Hospitalist Progress Note    03/04/2015  Admit Date: 02/24/2015 10:47 PM   NAME: Brett Becker   DOB:  1961-10-05   MRN:  [id number]   Attending: Caffie PintoAlexander M Drew, DO  PCP:  Phys Other, MD    SUBJECTIVE:   Patient   Patient is a 54 y.o. white?? male who presents with altered mental status.?? Pt has prior admission by me for DT's in 2014.?? He was brought to the ED by a friend due to diminished responsiveness and seizure at home.?? No family or friends present at this time.?? Pt able to give limited history due to lethargy.?? He denies ever having had a seizure in his life, though he has had documented seizures previously.?? Pt states he normally drinks 6-8 beers per day and hadn't been drinking as much today.?? He denies chest pain, dyspnea, nausea, vomiting, abdominal pain.?? He reports normally having a tremor, but it is worse at the present moment.?? He also reports to me that he saw a red ball bouncing around his house today, but knew that it wasn't actually there.    3/16 - Patient in bed with restraints, looks agitated, stating he wants to a knife to but the restraints. Does not threaten staff though, I have stressed that he needs to relax and rest. Mild tremors this am, CIWA protocol.??     3/17 - Resting comfortably this am, decrease in BL Wheezing on exam, Sats improved    3/18 - Patient more confused as prior days I don't appreciate any focal deficits at this time, will obtain a CT brain w/o contrast, and ammonia levels. Continue CIWA protocol, LFTs trending down.     3/19 - Patient knocking off his NC, will increased Systemic steroids as he did not tolerate titration. Maintain o2 sat>90%. Sedated this am, and restraints orders renewed.     3/20 - Patient more alert today than previous 2 days, again has taken his NC off.     3/21- TPN, MDS today via SW, less agitated this am, continue CIWA protocol. Failed MDS, switched meds to IV or topical many were already  ????   03/22 - seen. Still tremulous; not tolerating po as per Speech & swallow;     Review of Systems negative with exception of pertinent positives noted above  PHYSICAL EXAM   BP 158/98 mmHg   Pulse 72   Temp(Src) 97.8 ??F (36.6 ??C)   Resp 19   Ht 5\' 8"  (1.727 m)   Wt 51.937 kg (114 lb 8 oz)   BMI 17.41 kg/m2   SpO2 94%   Temp (24hrs), Avg:98 ??F (36.7 ??C), Min:97.7 ??F (36.5 ??C), Max:98.4 ??F (36.9 ??C)    Oxygen Therapy  O2 Sat (%): 94 % (03/04/15 1317)  Pulse via Oximetry: 81 beats per minute (03/03/15 0825)  O2 Device: Room air (03/04/15 1317)  O2 Flow Rate (L/min): 0 l/min (03/03/15 0825)    Intake/Output Summary (Last 24 hours) at 03/04/15 2023  Last data filed at 03/04/15 1438   Gross per 24 hour   Intake      0 ml   Output      0 ml   Net      0 ml      General: No acute distress????  Lungs:  CTA Bilaterally.   Heart:  Regular rate and rhythm,?? No murmur, rub, or gallop  Abdomen: Soft, Non distended, Non tender, Positive bowel sounds  Extremities: No cyanosis, clubbing or edema  Neurologic:?? No focal deficits  ASSESSMENT      Active Hospital Problems    Diagnosis Date Noted   ??? 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 Hyponatremia 02/25/2015   ??? Hepatitis C 08/31/2013   ??? HTN (hypertension) 08/30/2013   ??? Alcohol abuse 08/28/2013     Plan:  Continue IVFL;   Continue TPN   Pt is NPO -   Follow up speech & swallow eval  MRI brain to rule acute infarct  Further management as w/up dictates  IV Ativan     DVT prophylaxis: Lovenox  Code Status: FULL    Plan of care discussed with: patient  Time spent on patient care: 30 minutes  Anticipated date of discharge: 3 days      Francoise Schaumann, MD

## 2015-03-04 NOTE — Progress Notes (Signed)
Problem: Mobility Impaired (Adult and Pediatric)  Goal: *Acute Goals and Plan of Care (Insert Text)  Discharge Goals:  (1.)Brett Becker will move from supine to sit and sit to supine , scoot up and down and roll side to side with INDEPENDENT within 7 day(s).   (2.)Brett Becker will transfer from bed to chair and chair to bed with MODIFIED INDEPENDENCE using the least restrictive device within 7 day(s).   (3.)Brett Becker will ambulate with MODIFIED INDEPENDENCE for 150+ feet with the least restrictive device within 7 day(s).   (4.)Brett Becker will tolerate 25+ minutes of therapeutic activity/exercise while maintaining stable vitals to improve functional strength and endurance within 7 day(s).  (5.)Brett Becker will demonstrate good dynamic standing balance to safely perform functional mobility and ambulation within 7 day(s).  ________________________________________________________________________________________________  PHYSICAL THERAPY: Daily Note, Treatment Day: 3rd and AM  INPATIENT: Medicaid : Hospital Day: 9    NAME/AGE/GENDER: Brett Becker is a 54 y.o. 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 Patient performed supine to sitting with minimal to moderate assistance, scooting with moderate assistance, and sit to stand with moderate assistance x2. Patient demonstrating R lateral lean with sitting balance today and seems to be upset. Strong posterior lean continues in standing with poor dynamic balance despite verbal and manual cues for posture and initiated stepping with LLE, however difficulty with continuation. Ambulated to chair with  mod/max assist x2 and tab alert placed. Going to group therapy with COTA later this AM. Slow progress today, may be due to emotional distress. Continues to demonstrate decreased functional strength, safety, and mobility from baseline. Will continue to follow and progress toward stated goals.     ????????This section established at most recent assessment??????????  PROBLEM LIST (Impairments causing functional limitations):  1. Decreased Strength effecting function  2. Decreased ADL/Functional Activities  3. Decreased Transfer Abilities  4. Decreased Ambulation Ability/Technique  5. Decreased Balance  6. Decreased Activity Tolerance  7. Increased Fatigue effecting function  8. Increased Shortness of Breath affecting function  9. Decreased Knowledge of precautions  10. Decreased Independence with Home Exercise Program  REHABILITATION POTENTIAL FOR STATED GOALS: GOOD      PLAN OF CARE:   INTERVENTIONS PLANNED: (Benefits and precautions of physical therapy have been discussed with the patient.)  1. balance exercise  2. bed mobility  3. family education  4. gait training  5. neuromuscular re-education/strengthening  6. range of motion: active/assisted/passive  7. therapeutic activities  8. therapeutic exercise/strengthening  9. transfer training  FREQUENCY/DURATION: Follow patient 1-2 times per day/4-7 days per week until goals are met in order to address above goals.    RECOMMENDED REHABILITATION/EQUIPMENT: (at time of discharge pending progress):   TBD pending patient progress. Continue skilled acute PT services..  SUBJECTIVE:   Nonverbal today    Present Symptoms: endorses "no" 0/10 pain   Pain Intensity 1: 0     History of Present Injury/Illness: Per chart:  Patient is a 54 y.o. white  male who presents with altered mental status.  Pt has prior admission by me for DT's in 2014.  He was brought to the ED by a friend due to diminished responsiveness and seizure  at home.  No  family or friends present at this time.  Pt able to give limited history due to lethargy.  He denies ever having had a seizure in his life, though he has had documented seizures previously.  Pt states he normally drinks 6-8 beers per day and hadn't been drinking as much today.  He denies chest pain, dyspnea, nausea, vomiting, abdominal pain.  He reports normally having a tremor, but it is worse at the present moment.  He also reports to me that he saw a red ball bouncing around his house today, but knew that it wasn't actually there.      Past Medical History    Diagnosis  Date    ???  Delirium tremens ([place])  September, 2014        Admitted with DTs and seizure activity.  Found to have GI bleeding secondary to gastric ullcers.    ???  Gastric ulcer  September, 2014        Found at EGD to have major gastric ulcers.          Past Surgical History    Procedure  Laterality  Date    ???  Pr neurological procedure unlisted        ???  Hx back surgery            x 2    ???  Hx endoscopy    September, 2014              Prior Level of Function/Home Situation:   History provided by patient/chart; patient is a poor historian at time of initial assessment.  Patient live alone in a private residence/apartment.  At baseline, independent with ADLs and ambulates with a straight cane.  Home Environment: Private residence  One/Two Story Residence: One story  Living Alone: Yes  Support Systems: Friends \\ neighbors  Patient Expects to be Discharged to:: Apartment  Current DME Used/Available at Home: Cane, straight, Environmental consultant, rolling  OBJECTIVE/TREATMENT:   (In addition to Assessment/Re-Assessment sessions the following treatments were rendered)                                      [facility]??? ???6 Clicks???                                          Basic Mobility Inpatient Short Form  How much difficulty does the patient currently have... Unable A Lot A Little None   1.  Turning over in bed (including adjusting bedclothes, sheets and  blankets)?   '[ ]'  1   '[ ]'  2   '[X]'  3   '[ ]'  4   2.  Sitting down on and standing up from a chair with arms ( e.g., wheelchair, bedside commode, etc.)   '[ ]'  1   '[X]'  2   '[ ]'  3   '[ ]'  4   3.  Moving from lying on back to sitting on the side of the bed?   '[ ]'  1   '[ ]'  2   '[X]'  3   '[ ]'  4               How much help from another person does the patient currently need... Total A Lot A Little None   4.  Moving to and from a bed to a chair (including a wheelchair)?   '[ ]'  1   '[X]'  2   '[ ]'  3   '[ ]'  4   5.  Need to walk in hospital room?   '[ ]'  1   '[X]'  2   '[ ]'  3   '[ ]'  4   6.  Climbing 3-5 steps with a railing?   '[ ]'  1   '[X]'  2   '[ ]'  3   '[ ]'  4   ?? 2007, [employer], under license to [place]. All rights reserved       Score:  Initial: 14 Most Recent: 14 (Date: 02/25/14 )   Interpretation of Tool:  Represents activities that are increasingly more difficult (i.e. Bed mobility, Transfers, Gait).  Score 24 23 22-20 19-15 14-10 9-7 6   Modifier CH CI CJ CK CL CM CN       ?? Mobility - Walking and Moving Around:              W4132 - CURRENT STATUS:       CL - 60%-79% impaired, limited or restricted              G4010 - GOAL STATUS:               CJ - 20%-39% impaired, limited or restricted              U7253 - D/C STATUS:                    ---------------To be determined---------------  Payor: ADVICARE MEDICAID / Plan: [place]IC[place] / Product Type: Managed Care Medicaid /    Most Recent Physical Functioning:   Gross Assessment:                  Posture:  Posture (WDL): Exceptions to WDL  Posture Assessment: Forward head, Rounded shoulders, Kyphosis  Balance:  Sitting: Impaired  Sitting - Static: Poor (constant support)  Sitting - Dynamic: Poor (constant support)  Standing: Impaired  Standing - Static: Poor  Standing - Dynamic : Poor  Bed Mobility:  Supine to Sit: Moderate assistance  Scooting: Moderate assistance  Wheelchair Mobility:     Transfers:  Sit to Stand: Moderate assistance;Assist x2   Stand to Sit: Moderate assistance;Assist x2  Bed to Chair: Maximum assistance;Assist x2  Interventions: Manual cues;Safety awareness training;Verbal cues  Gait:     Base of Support: Narrowed;Center of gravity altered  Speed/Cadence: Slow;Shuffled  Step Length: Right shortened;Left shortened  Gait Abnormalities: Decreased step clearance;Shuffling gait;Step to gait;Trunk sway increased;Path deviations  Distance (ft): 5 Feet (ft)  Assistive Device:  (HHA x2)  Ambulation - Level of Assistance: Moderate assistance;Maximum assistance;Assist x2  Interventions: Manual cues;Safety awareness training;Verbal cues      Therapeutic Activity: (    0 minutes):  Therapeutic activities including bed mobility, supine to sit and sit to stand transfer training, static and dynamic sitting balance training, static standing balance training, safety awareness training, and patient education to improve mobility, strength and balance.  Required minimal to moderate assistance with mobility as well as verbal/tactile and manual cueing Manual cues;Safety awareness training;Verbal cues to promote static balance in sitting .    Gait Training ( 14 minutes):  Gait training to improve and/or restore physical functioning as related to mobility, strength, balance and coordination.  Ambulated 5 Feet (ft) with maximal assistance and maximal visual, verbal and tactile cues  related to their stance phase, stride length and push offto promote proper body alignment, promote proper body posture and promote proper body mechanics.  Instruction in performance of stepping with BLE to correct stance phase, stride length and push off.    Assistive Device:  (HHA x2)  Ambulation - Level of Assistance: Moderate assistance, Maximum assistance, Assist x2  Distance (ft): 5 Feet (ft)  Interventions: Manual cues, Safety awareness training, Verbal cues      Braces/Orthotics/Lines/Etc:   ?? PICC  Safety:   After treatment position/precautions:   ?? Up in chair, Supine in bed, Bed alarm/tab alert on, Bed/Chair-wheels locked, Bed in low position, Call light within reach and RN notified  Progression/Medical Necessity:   ?? Patient demonstrates good rehab potential due to higher previous functional level.  Compliance with Program/Exercises: Will assess as treatment progresses.   Reason for Continuation of Services/Other Comments:  ?? Patient continues to require skilled intervention due to decreased functional mobility, coordination, and balance/gait status from baseline..  Recommendations/Intent for next treatment session: Treatment next visit will focus on advancements to more challenging activities and reduction in assistance provided.    Total Treatment Duration:  Time In: 1275  Time Out: Emily, DPT

## 2015-03-04 NOTE — Progress Notes (Signed)
Problem: Nutrition Deficit  Goal: *Optimize nutritional status  Nutrition F/U: Management of TPN (Dr. Trudi Idarew-hospitalist)   Assessment:   ?? Remains NPO per MBS 3-21. Abdomen WDL, date of last BM 3-19. Therefore could use GI tract for nutrition if able to obtain and maintain access given his AMS  ?? Currently receiving TPN via PICC line  ?? K 3.0-WNL after bolus yesterday and K increase in TPN last night from ~84 meq/d to ~94 meq/d last night.  ?? BMP BS of 145 mg/dL-continues to receive Solu-Medrol. Will defer SQBS and SSI until BMP BS consistently over 180 mg/dL  Macronutrient needs:   EER: 1557-1816 kcal /day (30-35 kcal/kg Actual BW)   EPR: 62-72 grams protein/day (1.2-1.4 grams/kg IBW)   Max CHO: 299 g CHO/day (4 mg/kg Actual BW/min) or 250 grams/d (55% of kcal)  Fluid: 1 mL/kcal or per MD   Intake/Comparative Standards: Current TPN: DEX 15%/5% AA at 60 mL/hr with 250 mL daily lipids. This supplies 1522 kcals (98% of needs), 72 g protein (100% of needs), 216 g dextrose (does not exceed max limit) and 1690 mL fluid (100% of needs).   Intervention:   1. Meals and snacks: NPO.  2. Nutrition supplement therapy: Electrolyte protocols are active on MAR.  EN: Suggest placement of NGT/TF and start TF of Jevity 1.2 at 60 ml/hr with 25 ml water flush every hr to provide 1728 kcals (100% of needs), 80 g protein (111% of needs), 243 g CHO (does not exceed max limit) and 1766 mL fluid (100% of needs).         3. Parenteral Nutrition: Continue current TPN unless TF is started then would discontinue TPN and lipids.        4. Coordination of nutrition care: Discussed with Dr Blain Paisartwright.    Harlow MaresAmy Kaly Mcquary, RD, LD, CNSC [phone]

## 2015-03-05 LAB — METABOLIC PANEL, BASIC
Anion gap: 5 mmol/L — ABNORMAL LOW (ref 7–16)
BUN: 27 MG/DL — ABNORMAL HIGH (ref 6–23)
CO2: 27 mmol/L (ref 21–32)
Calcium: 8 MG/DL — ABNORMAL LOW (ref 8.3–10.4)
Chloride: 110 mmol/L — ABNORMAL HIGH (ref 98–107)
Creatinine: 0.55 MG/DL — ABNORMAL LOW (ref 0.8–1.5)
GFR est AA: >60 mL/min/{1.73_m2} (ref >60)
GFR est non-AA: >60 mL/min/{1.73_m2} (ref >60)
Glucose: 134 mg/dL — ABNORMAL HIGH (ref 65–100)
Potassium: 4.2 mmol/L (ref 3.5–5.1)
Sodium: 142 mmol/L (ref 136–145)

## 2015-03-05 LAB — PLEASE READ & DOCUMENT PPD TEST IN 72 HRS

## 2015-03-05 LAB — LIPID PANEL
CHOL/HDL Ratio: 3.4
Cholesterol, total: 94 MG/DL (ref <200)
HDL Cholesterol: 28 MG/DL — ABNORMAL LOW (ref 40–60)
LDL, calculated: 56.2 MG/DL (ref <100)
Triglyceride: 49 MG/DL (ref 35–150)
VLDL, calculated: 9.8 MG/DL (ref 6.0–23.0)

## 2015-03-05 LAB — PHOSPHORUS: Phosphorus: 4.3 MG/DL (ref 2.5–4.5)

## 2015-03-05 LAB — MAGNESIUM: Magnesium: 2 mg/dL (ref 1.8–2.4)

## 2015-03-05 MED ORDER — POTASSIUM CHLORIDE 2 MEQ/ML IV SOLN
2 mEq/mL | Freq: Every evening | INTRAVENOUS | Status: DC
Start: 2015-03-05 — End: 2015-03-06
  Administered 2015-03-05: 22:00:00 via INTRAVENOUS

## 2015-03-05 MED FILL — LORAZEPAM 2 MG/ML IJ SOLN: 2 mg/mL | INTRAMUSCULAR | Qty: 1

## 2015-03-05 MED FILL — LEVETIRACETAM 500 MG/5 ML IV SOLN: 500 mg/5 mL | INTRAVENOUS | Qty: 5

## 2015-03-05 MED FILL — MONOJECT PREFILL ADVANCED (PF) 100 UNIT/ML INTRAVENOUS SYRINGE: 100 unit/mL | INTRAVENOUS | Qty: 6

## 2015-03-05 MED FILL — SOLU-MEDROL (PF) 40 MG/ML SOLUTION FOR INJECTION: 40 mg/mL | INTRAMUSCULAR | Qty: 1

## 2015-03-05 MED FILL — CLINIMIX 4.25 % IN 10 % DEXTROSE SULFITE FREE INTRAVENOUS SOLUTION: 4.25 % | INTRAVENOUS | Qty: 1000

## 2015-03-05 MED FILL — LOVENOX 40 MG/0.4 ML SUBCUTANEOUS SYRINGE: 40 mg/0.4 mL | SUBCUTANEOUS | Qty: 0.4

## 2015-03-05 MED FILL — NICOTINE 21 MG/24 HR DAILY PATCH: 21 mg/24 hr | TRANSDERMAL | Qty: 1

## 2015-03-05 NOTE — Progress Notes (Signed)
Problem: Interdisciplinary Rounds  Goal: Interdisciplinary Rounds  Outcome: Progressing Towards Goal  Interdisciplinary team rounds were held 03/06/2015 with the following team members:Care Management, Pastoral Care, Physician and Floor Supervisor.    Patient needs and NGT placed for bolus nutrition.  He also will need LT[facility] or [facility] placement.    Plan of care discussed. See clinical pathway and/or care plan for interventions and desired outcomes.

## 2015-03-05 NOTE — Progress Notes (Signed)
NG tube placed.  Pt tolerated procedure fair.

## 2015-03-05 NOTE — Progress Notes (Signed)
Hospitalist Progress Note    03/05/2015  Admit Date: 02/24/2015 10:47 PM   NAME: Brett Becker   DOB:  11/04/1961   MRN:  [id number]   Attending: Caffie Pinto, DO  PCP:  Phys Other, MD    SUBJECTIVE:     Patient is a 54 y.o. white?? male who presents with altered mental status.?? Pt has prior admission by me for DT's in 2014.?? He was brought to the ED by a friend due to diminished responsiveness and seizure at home.?? No family or friends present at this time.?? Pt able to give limited history due to lethargy.?? He denies ever having had a seizure in his life, though he has had documented seizures previously.?? Pt states he normally drinks 6-8 beers per day and hadn't been drinking as much today.?? He denies chest pain, dyspnea, nausea, vomiting, abdominal pain.?? He reports normally having a tremor, but it is worse at the present moment.?? He also reports to me that he saw a red ball bouncing around his house today, but knew that it wasn't actually there.    3/16 - Patient in bed with restraints, looks agitated, stating he wants to a knife to but the restraints. Does not threaten staff though, I have stressed that he needs to relax and rest. Mild tremors this am, CIWA protocol.??     3/17 - Resting comfortably this am, decrease in BL Wheezing on exam, Sats improved    3/18 - Patient more confused as prior days I don't appreciate any focal deficits at this time, will obtain a CT brain w/o contrast, and ammonia levels. Continue CIWA protocol, LFTs trending down.     3/19 - Patient knocking off his NC, will increased Systemic steroids as he did not tolerate titration. Maintain o2 sat>90%. Sedated this am, and restraints orders renewed.     3/20 - Patient more alert today than previous 2 days, again has taken his NC off.     3/21- TPN, MDS today via SW, less agitated this am, continue CIWA protocol. Failed MDS, switched meds to IV or topical many were already  ????   03/22 - seen. Still tremulous; not tolerating po as per Speech & swallow;     03/23- patient failed speech and swallow eval again today. He is more coherent and is very upset with staff because he cannot eat. 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 Speech advised NGT until Friday and then re-evaluate; pt will need placement    Review of Systems negative with exception of pertinent positives noted above  PHYSICAL EXAM   BP 148/93 mmHg   Pulse 74   Temp(Src) 98 ??F (36.7 ??C)   Resp 19   Ht  (1.727 m)   Wt 51.937 kg (114 lb 8 oz)   BMI 17.41 kg/m2   SpO2 97%   Temp (24hrs), Avg:97.8 ??F (36.6 ??C), Min:97.5 ??F (36.4 ??C), Max:98 ??F (36.7 ??C)    Oxygen Therapy  O2 Sat (%): 97 % (03/05/15 0424)  Pulse via Oximetry: 81 beats per minute (03/03/15 0825)  O2 Device: Room air (03/04/15 1317)  O2 Flow Rate (L/min): 0 l/min (03/03/15 0825)    Intake/Output Summary (Last 24 hours) at 03/05/15 0725  Last data filed at 03/04/15 1438   Gross per 24 hour   Intake      0 ml   Output  0 ml   Net      0 ml      General: No acute distress????  Lungs:  CTA Bilaterally.   Heart:  Regular rate and rhythm,?? No murmur, rub, or gallop  Abdomen: Soft, Non distended, Non tender, Positive bowel sounds  Extremities: No cyanosis, clubbing or edema  Neurologic:?? No focal deficits    ASSESSMENT      Active Hospital Problems    Diagnosis Date Noted   ??? 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 Hyponatremia 02/25/2015   ??? Hepatitis C 08/31/2013   ??? HTN (hypertension) 08/30/2013   ??? Alcohol abuse 08/28/2013     Plan:  ??   Plan:  Continue IVFL;   Continue TPN ??  Pt is NPO - failed speech and swallow eval again today; ??will place ngt temporarily for feeds; revaluate in 2 day  MRI brain no acute infarct  Further management as w/up dictates  Continue IV Ativan     DVT prophylaxis: Lovenox  Code Status: FULL    Dispo; placement    Francoise SchaumannMarissa S Lititia Sen, MD

## 2015-03-05 NOTE — Progress Notes (Signed)
Problem: Dysphagia (Adult)  Goal: *Acute Goals and Plan of Care (Insert Text)  STG: Pt will complete laryngeal exercises x10 with 80% accuracy (goal revised 03/03/15)  STG: Pt will participate with modified barium swallow study x1 (goal met 03/03/15)  LTG: Pt will tolerate the least restrictive diet at discharge without respiratory compromise    SPEECH LANGUAGE PATHOLOGY: BEDSIDE SWALLOW STUDY: Daily Note 2    NAME/AGE/GENDER: Brett Becker is a 54 y.o. male  DATE: 03/05/2015  PRIMARY DIAGNOSIS: Delirium tremens ([place])       ICD-10: Treatment Diagnosis: dysphagia; oropharyngeal R13.12  INTERDISCIPLINARY COLLABORATION: raddiologist  PRECAUTIONS/ALLERGIES: Codeine   ASSESSMENT/PLAN OF CARE:   Patient was able to participate more with laryngeal exercises this date.  Completed exercises listed below with 70-90% accuracy.  Patient still with confusion stating he has lived here for 20 years.  Re-oriented to place and situation.  Patient with frequent moist coughing throughout the session.  Encouraged to expectorate.  Patient angry at the end of the session stating that he wants his food now.  Currently on TPN.  Modified barium swallow study (MBS) completed 2 days ago with frank aspiration with all liquids and residue of pudding that was not cleared.  Discussed recommendations for continuing NPO with hospitalist.  Plan to place NG tube with re-assessment the end of the week.    Patient will benefit from skilled intervention to address the below impairments.  ????????This section established at most recent assessment??????????  RECOMMENDATIONS AND PLANNED INTERVENTIONS (Benefits and precautions of therapy have been discussed with the patient.):  ?? NPO with alternative means of nutrition  MEDICATIONS:  ?? Non-oral  COMPENSATORY STRATEGIES/MODIFICATIONS INCLUDING:  ?? N/a  OTHER RECOMMENDATIONS (including follow up treatment recommendations):   ?? Tongue based expressions  ?? Family training/education  ?? Laryngeal exercises   ?? Patient education  FREQUENCY/DURATION: Continue to follow patient 3-5x a week to address above goals.   RECOMMENDED REHABILITATION/EQUIPMENT: (at time of discharge pending progress):   to be determined.      SUBJECTIVE:   Patient initially cooperative but angry toward the end of the session.  History of Present Injury/Illness: Brett Becker  has a past medical history of Delirium tremens [facility]) (September, 2014) and Gastric ulcer (September, 2014).  He also  has past surgical history that includes neurological procedure unlisted; back surgery; and endoscopy (September, 2014).  Present Symptoms: cough with po  Pain Intensity 1: 0  Pain Location 1: Hand  Pain Orientation 1: Left, Right  Pain Intervention(s) 1: MD notified (comment) (paging MD)    Current Dietary Status:  NPO           ?? History of reflux:  no  Social History/Home Situation: home alone  Home Environment: Private residence  One/Two Story Residence: One story  Living Alone: Yes  Support Systems: Friends \\ neighbors  Patient Expects to be Discharged to:: Apartment  Current DME Used/Available at Home: Cane, straight, Environmental consultant, rolling      OBJECTIVE:       Cognitive/Communication Status:  Mental Status  Neurologic State: Alert  Orientation Level: Oriented to person, Oriented to situation  Cognition: Decreased attention/concentration, Decreased command following, Follows commands, Impaired decision making  Perception: Appears intact  Perseveration: No perseveration noted  Safety/Judgement: Decreased awareness of environment, Fall prevention    Oral Assessment:  Oral Assessment  Labial: No impairment  Dentition: Limited, Natural  Lingual: Decreased rate  Mandible: No impairment  Gag Reflex: No impairment  LARYNGEAL / PHARYNGEAL EXERCISES:  Hard Glottal Attack: Yes  Reps : 10  Sets : 1              Mendelsohn Maneuver: Yes  Reps : 10  Sets : 1          Sing "EEE": Yes     Sets : 1                             Dysphagia Activities: Activities/Procedures listed utilized to improve progress in swallow function. Required maximal cueing to complete laryngeal exercises.    Tool Used: Dysphagia Outcome and Severity Scale (DOSS)     Score Comments   Normal Diet  '[ ]'  7 With no strategies or extra time needed        Functional Swallow  '[ ]'  6 May have mild oral or pharyngeal delay        Mild Dysphagia     '[ ]'  5 Which may require one diet consistency restricted (those who demonstrate penetration which is entirely cleared on MBS would be included)   Mild-Moderate Dysphagia  '[ ]'  4 With 1-2 diet consistencies restricted        Moderate Dysphagia  '[ ]'  3 With 2 or more diet consistencies restricted        Moderately Severe Dysphagia  '[ ]'  2 With partial PO strategies (trials with ST only)        Severe Dysphagia  '[X]'  1 With inability to tolerate any PO safely           Score:  Initial: 1 Most Recent: X (Date: -- )   Interpretation of Tool: The Dysphagia Outcome and Severity Scale (DOSS) is a simple, easy-to-use, 7-point scale developed to systematically rate the functional severity of dysphagia based on objective assessment and make recommendations for diet level, independence level, and type of nutrition.       Score '7 6 5 4 3 2 1   ' Modifier CH CI CJ CK CL CM CN   ?? Swallowing:              R4854 - CURRENT STATUS:           CN - 100% impaired, limited or restricted              O2703 - GOAL STATUS:                   CL - 60%-79% impaired, limited or restricted              J0093 - D/C STATUS:                       ---------------To be determined---------------  Payor: ADVICARE MEDICAID / Plan: [place]ICA[place] / Product Type: Managed Care Medicaid /   __________________________________________________________________________________________________  Safety:   After treatment position/precautions:  ?? Upright in Bed  Recommendations for treatment: laryngeal exercises  Total Treatment Duration:  Time In: 0930   Time Out: [place] MS, CCC-SLP

## 2015-03-05 NOTE — Progress Notes (Signed)
[facility].  Pt has been accepted for admission pending insurance approval.  [facility] [employer] insurance [employer].  Awaiting insurance approval.

## 2015-03-05 NOTE — Progress Notes (Addendum)
Problem: Nutrition Deficit  Goal: *Optimize nutritional status  Nutrition F/U: Management of TPN (Dr. Trudi Idarew-hospitalist)    Assessment:    ?? Remains NPO per MBS 3-21. Abdomen WDL, date of last BM 3-19. MD in agreement to begin TF today however reports pt is currently agitated so may remove NG tube. Pt is to have repeat MBS at end of week.  ?? Currently receiving TPN via PICC line. Will plan to wean to 1 L today until tolerance to TF known.  ?? Phosphorus trending up to 4.3 on 3/23- pt would benefit from less phosphorus in TPN bag.  ?? BMP BS of 134 mg/dL-continues to receive Solu-Medrol. Will defer SQBS and SSI until BMP BS consistently over 180 mg/dL  Macronutrient needs:    EER: 1557-1816 kcal /day (30-35 kcal/kg Actual BW)    EPR: 62-72 grams protein/day (1.2-1.4 grams/kg IBW)    Max CHO: 299 g CHO/day (4 mg/kg Actual BW/min) or 250 grams/d (55% of kcal)  Fluid: 1 mL/kcal or per MD    Intake/Comparative Standards: Current TPN: DEX 15%/5% AA at 60 mL/hr with 250 mL daily lipids. This supplies 1522 kcals (98% of needs), 72 g protein (100% of needs), 216 g dextrose (does not exceed max limit) and 1690 mL fluid (100% of needs).      Intervention:    1. Meals and snacks: NPO.  2. Nutrition supplement therapy: Electrolyte protocols are active on MAR. No replacement needed today.  3. EN: After NG tube placed, begin Jevity 1.2 at 25 mL/hr with 25 mL/hr water flush. Advance TF rate by 10 mL in 4 hours to goal of 35 mL/hr with 25 mL/hr water flush. This supplies 1008 kcals, 47 g protein, 142 g CHO and 1280 mL fluid. Do not advance TF rate above 35 mL/hr on 3/23.  Eventual TF goal will be Jevity 1.2 at 60 ml/hr with 25 ml water flush every hr to provide 1728 kcals (100% of needs), 80 g protein (111% of needs), 243 g CHO (does not exceed max limit) and 1766 mL fluid (100% of needs).          3. Parenteral Nutrition: Wean TPN to 1 L of DEX 10%/4.25% AA solution. Discontinue daily lipids. Reduce K to 50 mEq/L (5 mEq KPhos/L,  20 mEq KCl/L and 25 mEq KAcetate/L) This will supply 510 kcals, 43 g protein, 100 g dextrose and 1000 mL fluid. If pt tolerates placement of  NG tube and TF will plan to discontinue TPN on 3/24.        4. Coordination of nutrition care: Discussed with Dr Blain Paisartwright.    Su LeyErin Willey, MS,RD/LD [phone]

## 2015-03-06 ENCOUNTER — Inpatient Hospital Stay: Admit: 2015-03-06 | Payer: MEDICAID | Primary: Family Medicine

## 2015-03-06 LAB — METABOLIC PANEL, BASIC
Anion gap: 4 mmol/L — ABNORMAL LOW (ref 7–16)
BUN: 26 MG/DL — ABNORMAL HIGH (ref 6–23)
CO2: 28 mmol/L (ref 21–32)
Calcium: 8.4 MG/DL (ref 8.3–10.4)
Chloride: 108 mmol/L — ABNORMAL HIGH (ref 98–107)
Creatinine: 0.53 MG/DL — ABNORMAL LOW (ref 0.8–1.5)
GFR est AA: >60 mL/min/{1.73_m2} (ref >60)
GFR est non-AA: >60 mL/min/{1.73_m2} (ref >60)
Glucose: 141 mg/dL — ABNORMAL HIGH (ref 65–100)
Potassium: 4 mmol/L (ref 3.5–5.1)
Sodium: 140 mmol/L (ref 136–145)

## 2015-03-06 MED ORDER — HEPARIN, PORCINE (PF) 100 UNIT/ML IV SYRINGE
100 unit/mL | Freq: Three times a day (TID) | INTRAVENOUS | Status: DC
Start: 2015-03-06 — End: 2015-03-06

## 2015-03-06 MED ORDER — SCOPOLAMINE (1.3-1.5) MG 72 HR TRANSDERM PATCH
1 mg over 3 days | TRANSDERMAL | Status: DC
Start: 2015-03-06 — End: 2015-03-17

## 2015-03-06 MED ORDER — SALINE PERIPHERAL FLUSH Q8H
Freq: Three times a day (TID) | INTRAMUSCULAR | Status: DC
Start: 2015-03-06 — End: 2015-03-09
  Administered 2015-03-06 – 2015-03-09 (×10)

## 2015-03-06 MED ORDER — HEPARIN, PORCINE (PF) 100 UNIT/ML IV SYRINGE
100 unit/mL | INTRAVENOUS | Status: DC | PRN
Start: 2015-03-06 — End: 2015-03-13
  Administered 2015-03-06: 16:00:00

## 2015-03-06 MED ORDER — IPRATROPIUM-ALBUTEROL 2.5 MG-0.5 MG/3 ML NEB SOLUTION
2.5 mg-0.5 mg/3 ml | Freq: Three times a day (TID) | RESPIRATORY_TRACT | Status: DC
Start: 2015-03-06 — End: 2015-03-07
  Administered 2015-03-06 – 2015-03-07 (×3): via RESPIRATORY_TRACT

## 2015-03-06 MED FILL — SOLU-MEDROL (PF) 40 MG/ML SOLUTION FOR INJECTION: 40 mg/mL | INTRAMUSCULAR | Qty: 1

## 2015-03-06 MED FILL — NICOTINE 21 MG/24 HR DAILY PATCH: 21 mg/24 hr | TRANSDERMAL | Qty: 1

## 2015-03-06 MED FILL — IPRATROPIUM-ALBUTEROL 2.5 MG-0.5 MG/3 ML NEB SOLUTION: 2.5 mg-0.5 mg/3 ml | RESPIRATORY_TRACT | Qty: 3

## 2015-03-06 MED FILL — MONOJECT PREFILL ADVANCED (PF) 100 UNIT/ML INTRAVENOUS SYRINGE: 100 unit/mL | INTRAVENOUS | Qty: 3

## 2015-03-06 MED FILL — MONOJECT PREFILL ADVANCED (PF) 100 UNIT/ML INTRAVENOUS SYRINGE: 100 unit/mL | INTRAVENOUS | Qty: 6

## 2015-03-06 MED FILL — LORAZEPAM 2 MG/ML IJ SOLN: 2 mg/mL | INTRAMUSCULAR | Qty: 1

## 2015-03-06 MED FILL — LEVETIRACETAM 500 MG/5 ML IV SOLN: 500 mg/5 mL | INTRAVENOUS | Qty: 5

## 2015-03-06 MED FILL — TRANSDERM-SCOP 1 MG OVER 3 DAYS TRANSDERMAL PATCH: 1 mg over 3 days | TRANSDERMAL | Qty: 1

## 2015-03-06 MED FILL — LOVENOX 40 MG/0.4 ML SUBCUTANEOUS SYRINGE: 40 mg/0.4 mL | SUBCUTANEOUS | Qty: 0.4

## 2015-03-06 NOTE — Progress Notes (Signed)
Hospitalist Progress Note    03/06/2015  Admit Date: 02/24/2015 10:47 PM   NAME: Brett Becker   DOB:  [birth year]   MRN:  [id number]   Attending: Caffie PintoAlexander M Drew, DO  PCP:  Phys Other, MD    SUBJECTIVE:     Patient is a 54 y.o. white?? male who presents with altered mental status.?? Pt has prior admission by me for DT's in 2014.?? He was brought to the ED by a friend due to diminished responsiveness and seizure at home.?? No family or friends present at this time.?? Pt able to give limited history due to lethargy.?? He denies ever having had a seizure in his life, though he has had documented seizures previously.?? Pt states he normally drinks 6-8 beers per day and hadn't been drinking as much today.?? He denies chest pain, dyspnea, nausea, vomiting, abdominal pain.?? He reports normally having a tremor, but it is worse at the present moment.?? He also reports to me that he saw a red ball bouncing around his house today, but knew that it wasn't actually there.    3/16 - Patient in bed with restraints, looks agitated, stating he wants to a knife to but the restraints. Does not threaten staff though, I have stressed that he needs to relax and rest. Mild tremors this am, CIWA protocol.??     3/17 - Resting comfortably this am, decrease in BL Wheezing on exam, Sats improved    3/18 - Patient more confused as prior days I don't appreciate any focal deficits at this time, will obtain a CT brain w/o contrast, and ammonia levels. Continue CIWA protocol, LFTs trending down.     3/19 - Patient knocking off his NC, will increased Systemic steroids as he did not tolerate titration. Maintain o2 sat>90%. Sedated this am, and restraints orders renewed.     3/20 - Patient more alert today than previous 2 days, again has taken his NC off.     3/21- TPN, MDS today via SW, less agitated this am, continue CIWA protocol. Failed MDS, switched meds to IV or topical many were already  ????   03/22 - seen. Still tremulous; not tolerating po as per Speech & swallow;     03/23- patient failed speech and swallow eval again today. He is more coherent and is very upset with staff because he cannot eat. 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 Speech advised NGT until Friday and then re-evaluate; pt will need placement    03/24- patient aggravated overnight- pulled out NGT. It was replaced. He is much more coherent and is very upset with not being able to eat; he has a lot of secretions; plan to repeat swallow eval; He wants to have a drink.    Review of Systems negative with exception of pertinent positives noted above  PHYSICAL EXAM   BP 145/90 mmHg   Pulse 72   Temp(Src) 97.7 ??F (36.5 ??C)   Resp 16   Ht 5\' 8"  (1.727 m)   Wt 51.937 kg (114 lb 8 oz)   BMI 17.41 kg/m2   SpO2 90%   Temp (24hrs), Avg:97.8 ??F (36.6 ??C), Min:97.3 ??F (36.3 ??C), Max:98.2 ??F (36.8 ??C)    Oxygen Therapy  O2 Sat (%): 90 % (03/06/15 0741)  Pulse via Oximetry: 81 beats per minute (03/03/15 0825)  O2 Device: Room air (03/04/15 1317)  O2  Flow Rate (L/min): 0 l/min (03/03/15 0825)    Intake/Output Summary (Last 24 hours) at 03/06/15 0802  Last data filed at 03/05/15 1816   Gross per 24 hour   Intake     50 ml   Output      0 ml   Net     50 ml      General: No acute distress????  Lungs:  CTA Bilaterally.   Heart:  Regular rate and rhythm,?? No murmur, rub, or gallop  Abdomen: Soft, Non distended, Non tender, Positive bowel sounds  Extremities: No cyanosis, clubbing or edema  Neurologic:?? No focal deficits    ASSESSMENT      Active Hospital Problems    Diagnosis Date Noted   ??? 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 Hyponatremia 02/25/2015   ??? Hepatitis C 08/31/2013   ??? HTN (hypertension) 08/30/2013   ??? Alcohol abuse 08/28/2013     Plan:  Continue IVFL;   TPN ??discontinued  Pt is NPO - failed speech and swallow eval again today; ??ngt placed  temporarily for feeds- pt feels this is not sufficient; revaluate mane  MRI brain no acute infarct  Further management as w/up dictates  Continue IV Ativan   Heavy secretions Scopolamine patch    DVT prophylaxis: Lovenox  Code Status: FULL      Dipso- patient will need placement    Francoise Schaumann, MD

## 2015-03-06 NOTE — Progress Notes (Signed)
Problem: Dysphagia (Adult)  Goal: *Acute Goals and Plan of Care (Insert Text)  STG: Pt will complete laryngeal exercises x10 with 80% accuracy (goal revised 03/03/15)  STG: Pt will participate with modified barium swallow study x1 (goal met 03/03/15)  LTG: Pt will tolerate the least restrictive diet at discharge without respiratory compromise    SPEECH LANGUAGE PATHOLOGY: BEDSIDE SWALLOW STUDY: Daily Note 3    NAME/AGE/GENDER: Brett Becker is a 54 y.o. 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  Neighbors were present.  Completed exercises listed below with 70-90% accuracy.  Moistened swabs provided between sets.  Required additional time to complete this session.  Coughing during completion of falsettos.  Patient with NG tube in place.  Requires consistent re-education on swallowing status and aspiration risks.  Plan for repeat modified barium swallow study (MBS) tomorrow am to determine if safe for any po diet at this time and determine plan of care.    Patient will benefit from skilled intervention to address the below impairments.  ????????This section established at most recent assessment??????????  RECOMMENDATIONS AND PLANNED INTERVENTIONS (Benefits and precautions of therapy have been discussed with the patient.):  ?? NPO with alternative means of nutrition  MEDICATIONS:  ?? Non-oral  COMPENSATORY STRATEGIES/MODIFICATIONS INCLUDING:  ?? N/a  OTHER RECOMMENDATIONS (including follow up treatment recommendations):   ?? Tongue based expressions  ?? Family training/education  ?? Laryngeal exercises  ?? Patient education  FREQUENCY/DURATION: Continue to follow patient 3-5x a week to address above goals.    RECOMMENDED REHABILITATION/EQUIPMENT: (at time of discharge pending progress):   to be determined.      SUBJECTIVE:   Patient was cooperative.   History of Present Injury/Illness: Brett Becker  has a past medical history of Delirium tremens [facility]) (September, 2014) and Gastric ulcer (September, 2014).  He also  has past surgical history that includes neurological procedure unlisted; back surgery; and endoscopy (September, 2014).  Present Symptoms: cough with po  Pain Intensity 1: 0  Pain Location 1: Hand  Pain Orientation 1: Left, Right  Pain Intervention(s) 1: MD notified (comment) (paging MD)    Current Dietary Status:  NPO           ?? History of reflux:  no  Social History/Home Situation: home alone  Home Environment: Private residence  One/Two Story Residence: One story  Living Alone: Yes  Support Systems: Friends \\ neighbors  Patient Expects to be Discharged to:: Apartment  Current DME Used/Available at Home: Cane, straight, Environmental consultant, rolling      OBJECTIVE:       Cognitive/Communication Status:  Mental Status  Neurologic State: Confused, Alert  Orientation Level: Oriented to person  Cognition: Decreased attention/concentration, Decreased command following, Poor safety awareness  Perception: Appears intact  Perseveration: No perseveration noted  Safety/Judgement: Decreased awareness of environment, Fall prevention    Oral Assessment:  Oral Assessment  Labial: No impairment  Dentition: Limited, Natural  Lingual: Decreased rate  Mandible: No impairment  Gag Reflex: No impairment  LARYNGEAL / PHARYNGEAL EXERCISES:        Sets : 1  Hard Glottal Attack: Yes  Reps : 10  Sets : 1              Mendelsohn Maneuver: Yes  Reps :  10  Sets : 1          Sing "EEE": Yes  Reps : 10  Sets : 1                            Dysphagia Activities: Activities/Procedures listed utilized to improve progress in swallow function. Required maximal cueing to complete laryngeal exercises.    Tool Used: Dysphagia Outcome and Severity Scale (DOSS)      Score Comments   Normal Diet  '[ ]'  7 With no strategies or extra time needed        Functional Swallow  '[ ]'  6 May have mild oral or pharyngeal delay        Mild Dysphagia     '[ ]'  5 Which may require one diet consistency restricted (those who demonstrate penetration which is entirely cleared on MBS would be included)   Mild-Moderate Dysphagia  '[ ]'  4 With 1-2 diet consistencies restricted        Moderate Dysphagia  '[ ]'  3 With 2 or more diet consistencies restricted        Moderately Severe Dysphagia  '[ ]'  2 With partial PO strategies (trials with ST only)        Severe Dysphagia  '[X]'  1 With inability to tolerate any PO safely           Score:  Initial: 1 Most Recent: X (Date: -- )   Interpretation of Tool: The Dysphagia Outcome and Severity Scale (DOSS) is a simple, easy-to-use, 7-point scale developed to systematically rate the functional severity of dysphagia based on objective assessment and make recommendations for diet level, independence level, and type of nutrition.       Score '7 6 5 4 3 2 1   ' Modifier CH CI CJ CK CL CM CN   ?? Swallowing:              C3762 - CURRENT STATUS:           CN - 100% impaired, limited or restricted              G3151 - GOAL STATUS:                   CL - 60%-79% impaired, limited or restricted              V6160 - D/C STATUS:                       ---------------To be determined---------------  Payor: ADVICARE MEDICAID / Plan: [place]IC[place]ICA[place] / Product Type: Managed Care Medicaid /   __________________________________________________________________________________________________  Safety:   After treatment position/precautions:  ?? Upright in Bed  Recommendations for treatment: laryngeal exercises  Total Treatment Duration:  Time In: 1015   Time Out: 1039     Serita Grammes MS, CCC-SLP

## 2015-03-06 NOTE — Progress Notes (Signed)
Patient removed NG tube this shift. New NG tube placed. Patient tolerated procedure well.

## 2015-03-06 NOTE — Progress Notes (Signed)
END OF SHIFT NOTE:    INTAKE/OUTPUT  03/23 0701 - 03/24 0700  In: 3948 [I.V.:3898]  Out: -   Voiding: YES  Catheter: NO  Color: clear  Drain:   Nasogastric Tube 03/05/15 (Active)   Site Assessment Clean, dry, & intact 03/06/2015  3:28 PM   Dressing Status Clean, dry, & intact 03/06/2015  3:28 PM   G Port Status Infusing 03/06/2015  3:28 PM   External Insertion Mark (cms) 65 cms 03/06/2015  3:28 PM   Action Taken Placement verified (comment);Retaped 03/06/2015  3:28 PM   Drainage Description Tan 03/06/2015  3:28 PM   Gastric Residual (mL) 70 ml 03/06/2015  3:28 PM   Tube Feeding/Formula Options Jevity 1.2 03/06/2015  3:28 PM   Modular Nutrients Fiber 03/05/2015  6:16 PM   Tube Feeding/Verify Rate (mL/hr) 50 03/06/2015  3:28 PM   Water Flush Volume (mL) 25 mL 03/06/2015  3:28 PM   Intake (ml) 176 ml 03/06/2015  3:28 PM               DIET  NPO    Flatus: Patient does have flatus present.    Stool:  1 occurrences.    Characteristics:  Stool Assessment  Stool Color: Yellow  Stool Appearance: Loose, Soft  Stool Amount: Large  Stool Source/Status: Rectum    Ambulating  NO    Emesis: 0 occurrences.    Characteristics:          VITAL SIGNS  Patient Vitals for the past 12 hrs:   Temp Pulse Resp BP SpO2   03/06/15 1545 98.8 ??F (37.1 ??C) 70 19 137/84 mmHg 90 %   03/06/15 1118 98.2 ??F (36.8 ??C) 81 16 (!) 145/92 mmHg 97 %   03/06/15 0741 97.7 ??F (36.5 ??C) 72 16 145/90 mmHg 90 %       Pain Assessment  Pain Intensity 1: 0 (03/06/15 1340)  Pain Location 1: Hand  Pain Intervention(s) 1: MD notified (comment) (paging MD)  Patient Stated Pain Goal: 0    Neurological ROS: no TIA or stroke symptoms        MEGAN Ivory BroadM KELLY, RN

## 2015-03-06 NOTE — Progress Notes (Signed)
Problem: Self Care Deficits Care Plan (Adult)  Goal: *Acute Goals and Plan of Care (Insert Text)  1. Patient will feed self entire meal with set-up.   2. Patient will complete grooming with set-up.   3. Patient will tolerate 15 minutes of OT treatment with no rest breaks to increase activity tolerance for ADLs.   4. Patient will complete functional transfers with minimal assistance and adaptive equipment as needed.   5. Patient will demonstrate fair standing balance to increase safety during standing ADLs.     Timeframe: 7 visits   OCCUPATIONAL THERAPY: Daily Note, Treatment Day: 5th and PM  INPATIENT: Medicaid : Hospital Day: 11    NAME/AGE/GENDER: Brett Becker is a 54 y.o. 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 Pt presents supine upon arrival. Pt was able to complete bed mobility with mod assist today. At edge of bed, patient demonstrates poor sitting balance. Pt able to perform self care tasks (in grid below) with min assist. New brief applied to patient. Pt following commands well today. Balance is still poor in standing. Sitting balance is fair. Pt agitated about feeding situation still. Will continue to benefit from skilled OT during stay.  ????????This section established at most recent assessment??????????  PROBLEM LIST (Impairments causing functional limitations):  1. Decreased Strength effecting function  2. Decreased ADL/Functional Activities  3. Decreased Transfer Abilities  4. Decreased Ambulation Ability/Technique  5. Decreased Balance  6. Decreased Activity Tolerance   7. Impaired balance affecting function  8. Impaired coordination affecting function  REHABILITATION POTENTIAL FOR STATED GOALS: GUARDED  PLAN OF CARE:  INTERVENTIONS PLANNED: (Benefits and precautions of occupational therapy have been discussed with the patient.)  1. Activities of daily living training  2. Adaptive equipment training  3. Cognitive training  4. Donning&doffing training  5. Group therapy  6. Theraputic activity  7. Theraputic exercise  FREQUENCY/DURATION: Follow patient 1-2 times per day/3-5 days per week until goals are met in order to address above goals.    RECOMMENDED REHABILITATION/EQUIPMENT: (at time of discharge pending progress):   Continue OT.  SUBJECTIVE:   "I want this out of my nose"    History of Present Injury/Illness: Per H&P, "Patient is a 53 y.o. white  male who presents with altered mental status.  Pt has prior admission by me for DT's in 2014.  He was brought to the ED by a friend due to diminished responsiveness and seizure at home.  No family or friends present at this time.  Pt able to give limited history due to lethargy.  He denies ever having had a seizure in his life, though he has had documented seizures previously.  Pt states he normally drinks 6-8 beers per day and hadn't been drinking as much today.  He denies chest pain, dyspnea, nausea, vomiting, abdominal pain.  He reports normally having a tremor, but it is worse at the present moment.  He also reports to me that he saw a red ball bouncing around his house today, but knew that it wasn't actually there."  Present Symptoms: confusion    Pain Intensity 1: 0  Prior  Level of Function/Home Situation: Patient is a poor historian but reports that he lives with his friend. He says he is independent with ADLs. He uses a bike for communication and does not drive.    Home Environment: Private residence  One/Two Story Residence: One story  Living Alone: Yes  Support Systems: Friends \\ neighbors   Patient Expects to be Discharged to:: Apartment  Current DME Used/Available at Home: Cane, straight, Environmental consultant, rolling  OBJECTIVE/TREATMENT:   Self Care: (10 minutes): Procedure(s) (per grid) utilized to improve and/or restore self-care/home management as related to bathing and grooming. Required minimal verbal and tactile cueing to facilitate activities of daily living skills.    Exercises completed with yellow theraband Date:  03/04/2015 Date:   Date:     Activity/Exercise Parameters Parameters Parameters   Horizontal Shoulder Abd/Adduction 15 reps     Shoulder Flexion 10 reps     Elbow Flexion 15 reps     Punches 10 reps                                                                         [place] AM-PACTM "6 Clicks"                                                       Daily Activity Inpatient Short Form  How much help from another person does the patient currently need... Total A Lot A Little None   1.  Putting on and taking off regular lower body clothing?   '[X]'  1   '[ ]'  2   '[ ]'  3   '[ ]'  4   2.  Bathing (including washing, rinsing, drying)?   '[X]'  1   '[ ]'  2   '[ ]'  3   '[ ]'  4   3.  Toileting, which includes using toilet, bedpan or urinal?   '[X]'  1   '[ ]'  2   '[ ]'  3   '[ ]'  4   4.  Putting on and taking off regular upper body clothing?   '[ ]'  1   '[X]'  2   '[ ]'  3   '[ ]'  4   5.  Taking care of personal grooming such as brushing teeth?   '[ ]'  1   '[X]'  2   '[ ]'  3   '[ ]'  4   6.  Eating meals?   '[ ]'  1   '[X]'  2   '[ ]'  3   '[ ]'  4   ?? 2007, [employer], under license to [employer]. All rights reserved       Score:  Initial: 9 Most Recent: X (Date: -- )   Interpretation of Tool:  Represents clinically-significant functional categories (i.e.Activities of daily living).  Score 24 23 22-20 19-14 13-9 8-7 6   Modifier CH CI CJ CK CL CM CN       ?? Self Care:              [phone] - CURRENT STATUS:  CL - 60%-79% impaired, limited or restricted               [id number] - GOAL STATUS:                   CK - 40%-59% impaired, limited or restricted              I7782 - D/C STATUS:                       ---------------To be determined---------------  Payor: ADVICARE MEDICAID / Plan: [place] / Product Type: Managed Care Medicaid /      Balance  Sitting: Impaired  Sitting - Static: Fair (occasional)  Sitting - Dynamic: Fair (occasional)  Standing: Impaired  Standing - Static: Poor  Standing - Dynamic : Poor       Patient Vitals for the past 6 hrs:   BP BP Patient Position SpO2 Pulse   03/06/15 1118 (!) 145/92 mmHg At rest 97 % 81   03/06/15 1545 137/84 mmHg At rest 90 % 70                           Most Recent Physical Functioning:   Gross Assessment:  AROM: Generally decreased, functional (BUE)  Strength: Generally decreased, functional (BUE)  Coordination: Generally decreased, functional (BUE)               Posture:     Balance:     Bed Mobility:     Wheelchair Mobility:     Transfers:        Mental Status  Neurologic State: Alert;Confused  Orientation Level: Oriented to person;Oriented to place  Cognition: Follows commands  Perception: Appears intact  Perseveration: No perseveration noted  Safety/Judgement: Decreased awareness of environment;Fall prevention      Basic ADLs (From Assessment) Complex ADLs (From Assessment)   Basic ADL  Feeding: Moderate assistance  Oral Facial Hygiene/Grooming: Moderate assistance  Bathing: Maximum assistance  Upper Body Dressing: Maximum assistance  Lower Body Dressing: Maximum assistance  Toileting: Total assistance Instrumental ADL  Meal Preparation: Total assistance  Homemaking: Total assistance  Medication Management: Total assistance  Financial Management: Total assistance   Grooming/Bathing/Dressing Activities of Daily Living   Grooming  Washing Face: Supervision/set-up  Cues: Verbal cues provided Cognitive Retraining  Safety/Judgement: Decreased awareness of environment;Fall prevention         Lower Body Bathing   Perineal  : Minimum assistance  Position Performed: Other (seated edge of bed)             Bed/Mat Mobility  Supine to Sit: Moderate assistance  Sit to Stand: Moderate assistance;Assist x2  Bed to Chair: Moderate assistance;Assist x2  Scooting: Moderate assistance          Braces/Orthotics/Lines/Other:   ?? Hand Dominance: unknown  ?? IV  ?? O2 Device: Nasal cannula    Safety:   After treatment position/precautions:  ?? Up in chair, Bed alarm/tab alert on, Bed/Chair-wheels locked and Call light within reach    Progression/Medical Necessity:   ?? Patient demonstrates fair rehab potential due to higher previous functional level.  Compliance with Program/Exercises: noncompliant some of the time.   Reason for Continuation of Services/Other Comments:  ?? Patient continues to require present interventions due to patient's inability to care for self without assistance.  Recommendations/Intent for next treatment session: Treatment next visit will focus on advancements to more challenging activities and reduction  in assistance provided.  Total Treatment Duration:  Time In: 1340  Time Out: [place], COTA

## 2015-03-06 NOTE — Progress Notes (Signed)
Pt pulled out NG tube, attempted to place new one and was unsuccessful.  MD notified. This RN asked for Dob Hoff placement and MD stated was ok.

## 2015-03-06 NOTE — Progress Notes (Signed)
Problem: Nutrition Deficit  Goal: *Optimize nutritional status  Nutrition F/U: Management of TF (Dr. Blain Paisartwright)      ?? TPN stopped this AM as pt had NG tube placed on 3/23 and has been tolerating TF thus far.  ?? Pt awake but does not converse this AM with RD. RN reports pt with minimal residuals (~10 mL). One BM recorded on 3/23.  ?? Labs reviewed and stable.  ?? Repeat MBS planned for later this week.    Macronutrient needs:     EER: 1557-1816 kcal /day (30-35 kcal/kg Actual BW)     EPR: 62-72 grams protein/day (1.2-1.4 grams/kg IBW)     Max CHO:  250 grams/d (55% of kcal)  Fluid: 1 mL/kcal or per MD     Intake/Comparative Standards: TF of Jevity 1.2 at 35 mL/hr with 25 mL/hr water flush supplies 1008 kcals (65% of needs), 47 g protein (76% of needs), 142 g CHO (does not exceed max limit) and 1280 mL fluid (82% of needs). TF in process of advancing to goal.    Intervention:     1. Meals and snacks: NPO. Follow repeat MBS results.  2. Nutrition supplement therapy: Electrolyte protocols are active on MAR. No replacement needed today.  3. EN: Advance TF rate to 40 mL/hr with 25 mL/hr water flush. Advance TF rate by 10 mL every 4 hours as tolerated to goal of Jevity 1.2 at 60 mL/hr with 25 mL/hr water flush.  This provides 1728 kcals (100% of needs), 80 g protein (111% of needs), 243 g CHO (does not exceed max limit) and 1766 mL fluid (100% of needs).        4. Coordination of nutrition care: Discussed with RN Antonietta Barcelona(Megan Kelly)    Brett LeyErin Willey, MS,RD/LD [phone]

## 2015-03-06 NOTE — Progress Notes (Signed)
Problem: Mobility Impaired (Adult and Pediatric)  Goal: *Acute Goals and Plan of Care (Insert Text)  Discharge Goals:  (1.)Brett Becker will move from supine to sit and sit to supine , scoot up and down and roll side to side with INDEPENDENT within 7 day(s).   (2.)Brett Becker will transfer from bed to chair and chair to bed with MODIFIED INDEPENDENCE using the least restrictive device within 7 day(s).   (3.)Brett Becker will ambulate with MODIFIED INDEPENDENCE for 150+ feet with the least restrictive device within 7 day(s).   (4.)Brett Becker will tolerate 25+ minutes of therapeutic activity/exercise while maintaining stable vitals to improve functional strength and endurance within 7 day(s).  (5.)Brett Becker will demonstrate good dynamic standing balance to safely perform functional mobility and ambulation within 7 day(s).  ________________________________________________________________________________________________  PHYSICAL THERAPY: Daily Note, Treatment Day: 4th and AM  INPATIENT: Medicaid : Hospital Day: 11    NAME/AGE/GENDER: Brett Becker is a 54 y.o. 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 presents supine in bed, oriented to person/situation, and agreeable to PT treatment session. [name] performed bed mobility with moderate assistance and demonstrated (improved) fair static and fair dynamic balance sitting edge of bed. Performed sit to stand x5 with moderate assist to assist with ADL activity with COTA. Ambulated 52f to bedside chair with moderate handhold assistance x2. Demonstrated a slow, shuffled, step-to gait pattern with poor dynamic balance and strong  posterior lean. Tactile/verbal cues for flexed posture. Tolerated sitting up in bedside chair x1 hour. Ambulated 551fback into bed with RW and maximal assistance x2. Patient required verbal cues for gait mechanics, upright posture and safe RW negotiation. Good progress this PM with patient tolerating a longer treatment session. Continues to demonstrate decreased functional mobility and balance/gait status from baseline. Recommend continued skilled acute PT services to optimize functional independence and safety. Will continue to follow and progress toward stated goals.    ????????This section established at most recent assessment??????????  PROBLEM LIST (Impairments causing functional limitations):  1. Decreased Strength effecting function  2. Decreased ADL/Functional Activities  3. Decreased Transfer Abilities  4. Decreased Ambulation Ability/Technique  5. Decreased Balance  6. Decreased Activity Tolerance  7. Increased Fatigue effecting function  8. Increased Shortness of Breath affecting function  9. Decreased Knowledge of precautions  10. Decreased Independence with Home Exercise Program  REHABILITATION POTENTIAL FOR STATED GOALS: GOOD      PLAN OF CARE:   INTERVENTIONS PLANNED: (Benefits and precautions of physical therapy have been discussed with the patient.)  1. balance exercise  2. bed mobility  3. family education  4. gait training  5. neuromuscular re-education/strengthening  6. range of motion: active/assisted/passive  7. therapeutic activities  8. therapeutic exercise/strengthening  9. transfer training  FREQUENCY/DURATION: Follow patient 1-2 times per day/4-7 days per week until goals are met in order to address above goals.    RECOMMENDED REHABILITATION/EQUIPMENT: (at time of discharge pending progress):   TBD pending patient progress. Continue skilled acute PT services..  SUBJECTIVE:   "I'm okay."    Present Symptoms: endorses "no" 0/10 pain   Pain Intensity 1: 0      History of Present Injury/Illness: Per chart:  Patient is a [age].o. white  male who presents with altered mental status.  Pt has prior admission by me for DT's in 2014.  He was brought to the ED by a friend due to diminished responsiveness and seizure at home.  No family or friends present at this time.  Pt able to give limited history due to lethargy.  He denies ever having had a seizure in his life, though he has had documented seizures previously.  Pt states he normally drinks 6-8 beers per day and hadn't been drinking as much today.  He denies chest pain, dyspnea, nausea, vomiting, abdominal pain.  He reports normally having a tremor, but it is worse at the present moment.  He also reports to me that he saw a red ball bouncing around his house today, but knew that it wasn't actually there.      Past Medical History    Diagnosis  Date    ???  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  Found to have GI bleeding secondary to gastric ullcers.    ???  Gastric ulcer  September, 2014        Found at EGD to have major gastric ulcers.        Past Surgical History    Procedure  Laterality  Date    ???  Pr neurological procedure unlisted        ???  Hx back surgery            x 2    ???  Hx endoscopy    September, 2014              Prior Level of Function/Home Situation:   History provided by patient/chart; patient is a poor historian at time of initial assessment.  Patient live alone in a private residence/apartment.  At baseline, independent with ADLs and ambulates with a straight cane.  Home Environment: Private residence  One/Two Story Residence: One story  Living Alone: Yes  Support Systems: Friends \\ neighbors  Patient Expects to be Discharged to:: Apartment  Current DME Used/Available at Home: Cane, straight, Environmental consultant, rolling  OBJECTIVE/TREATMENT:   (In addition to Assessment/Re-Assessment sessions the following treatments were rendered)                                       [facility]??? ???6 Clicks???                                          Basic Mobility Inpatient Short Form  How much difficulty does the patient currently have... Unable A Lot A Little None   1.  Turning over in bed (including adjusting bedclothes, sheets and blankets)?   '[ ]'  1   '[ ]'  2   '[X]'  3   '[ ]'  4   2.  Sitting down on and standing up from a chair with arms ( e.g., wheelchair, bedside commode, etc.)   '[ ]'  1   '[X]'  2   '[ ]'  3   '[ ]'  4   3.  Moving from lying on back to sitting on the side of the bed?   '[ ]'  1   '[ ]'  2   '[X]'  3   '[ ]'  4  How much help from another person does the patient currently need... Total A Lot A Little None   4.  Moving to and from a bed to a chair (including a wheelchair)?   '[ ]'  1   '[X]'  2   '[ ]'  3   '[ ]'  4   5.  Need to walk in hospital room?   '[ ]'  1   '[X]'  2   '[ ]'  3   '[ ]'  4   6.  Climbing 3-5 steps with a railing?   '[ ]'  1   '[X]'  2   '[ ]'  3   '[ ]'  4   ?? 2007, [employer], under license to [employer]. All rights reserved       Score:  Initial: 14 Most Recent: 14 (Date: 02/25/14 )   Interpretation of Tool:  Represents activities that are increasingly more difficult (i.e. Bed mobility, Transfers, Gait).  Score 24 23 22-20 19-15 14-10 9-7 6   Modifier CH CI CJ CK CL CM CN       ?? Mobility - Walking and Moving Around:              [id number] - CURRENT STATUS:       CL - 60%-79% impaired, limited or restricted              [id number] - GOAL STATUS:               CJ - 20%-39% impaired, limited or restricted              [id number] - D/C STATUS:                    ---------------To be determined---------------  Payor: ADVICARE MEDICAID / Plan: [place] ADVICARE MEDICAID / Product Type: Managed Care Medicaid /    Most Recent Physical Functioning:   Gross Assessment:  AROM: Within functional limits (B LE)  Strength: Generally decreased, functional (4-/5 B LE functionally)  Coordination: Generally decreased, functional               Posture:   Posture (WDL): Exceptions to WDL  Posture Assessment: Forward head, Rounded shoulders, Kyphosis  Balance:  Sitting: Impaired  Sitting - Static: Fair (occasional)  Sitting - Dynamic: Fair (occasional)  Standing: Impaired  Standing - Static: Poor  Standing - Dynamic : Poor  Bed Mobility:  Supine to Sit: Moderate assistance  Scooting: Moderate assistance  Wheelchair Mobility:     Transfers:  Sit to Stand: Moderate assistance;Assist x2  Stand to Sit: Moderate assistance;Assist x2  Bed to Chair: Moderate assistance;Assist x2  Gait:     Base of Support: Narrowed  Speed/Cadence: Shuffled;Slow  Step Length: Left shortened;Right shortened  Gait Abnormalities: Decreased step clearance;Shuffling gait;Step to gait;Trunk sway increased  Distance (ft): 10 Feet (ft) (2x5)  Assistive Device: Walker, rolling (B handhold)  Ambulation - Level of Assistance: Moderate assistance;Assist x2  Interventions: Safety awareness training;Tactile cues;Verbal cues       Therapeutic Activity (   25 Minutes):  Therapeutic activities including bed mobility, transfer training, balance training, safety awareness training, ambulation on level ground 2x71f, and patient education   below to improve mobility, strength, balance and coordination.  Required moderate to maximal assistance with mobility as well as  visual, verbal, manual and tactile cues to promote static and dynamic balance in standing and promote coordination of bilateral, lower extremity(s).      Braces/Orthotics/Lines/Etc:   ?? IV, nasogastric tube and  PICC  Safety:   After treatment position/precautions:  ?? Supine in bed, Bed alarm/tab alert on, Bed/Chair-wheels locked, Bed in low position, Call light within reach, RN notified and Nurse at bedside  Progression/Medical Necessity:   ?? Patient demonstrates good rehab potential due to higher previous functional level.  Compliance with Program/Exercises: Will assess as treatment progresses.   Reason for Continuation of Services/Other Comments:   ?? Patient continues to require skilled intervention due to decreased functional mobility, coordination, and balance/gait status from baseline..  Recommendations/Intent for next treatment session: Treatment next visit will focus on advancements to more challenging activities and reduction in assistance provided.    Total Treatment Duration:  Time In: 1340 (1450)  Time Out: 1400 (1503)  Harrietta Guardian, PT

## 2015-03-06 NOTE — Progress Notes (Signed)
Per Dr. Blain Paisartwright, TPN may be discontinued as pt is now receiving tube feedings per NGT.

## 2015-03-06 NOTE — Progress Notes (Signed)
Pt's daughter called for update. After she provided this RN with pt's security code, this RN informed her pt was back in the bed after sitting up in the recliner and that pt had been resting quietly throughout the day.

## 2015-03-07 ENCOUNTER — Inpatient Hospital Stay: Admit: 2015-03-07 | Payer: MEDICAID | Primary: Family Medicine

## 2015-03-07 LAB — METABOLIC PANEL, BASIC
Anion gap: 5 mmol/L — ABNORMAL LOW (ref 7–16)
BUN: 28 MG/DL — ABNORMAL HIGH (ref 6–23)
CO2: 28 mmol/L (ref 21–32)
Calcium: 8.2 MG/DL — ABNORMAL LOW (ref 8.3–10.4)
Chloride: 106 mmol/L (ref 98–107)
Creatinine: 0.65 MG/DL — ABNORMAL LOW (ref 0.8–1.5)
GFR est AA: >60 mL/min/{1.73_m2} (ref >60)
GFR est non-AA: >60 mL/min/{1.73_m2} (ref >60)
Glucose: 156 mg/dL — ABNORMAL HIGH (ref 65–100)
Potassium: 3.7 mmol/L (ref 3.5–5.1)
Sodium: 139 mmol/L (ref 136–145)

## 2015-03-07 LAB — PHOSPHORUS: Phosphorus: 4.2 MG/DL (ref 2.5–4.5)

## 2015-03-07 LAB — MAGNESIUM: Magnesium: 1.8 mg/dL (ref 1.8–2.4)

## 2015-03-07 MED ORDER — BARIUM SULFATE 40 % (W/V), 30% (W/W) ORAL PASTE
40 % (w/v), 30% (w/w) | Freq: Once | ORAL | Status: AC
Start: 2015-03-07 — End: 2015-03-07
  Administered 2015-03-07: 14:00:00 via ORAL

## 2015-03-07 MED ORDER — BARIUM SULFATE 98 % ORAL SUSP, RECON
98 % | Freq: Once | ORAL | Status: AC
Start: 2015-03-07 — End: 2015-03-07
  Administered 2015-03-07: 14:00:00 via ORAL

## 2015-03-07 MED ORDER — HALOPERIDOL LACTATE 5 MG/ML IJ SOLN
5 mg/mL | Freq: Four times a day (QID) | INTRAMUSCULAR | Status: DC | PRN
Start: 2015-03-07 — End: 2015-03-17
  Administered 2015-03-07: 22:00:00 via INTRAVENOUS

## 2015-03-07 MED ORDER — BARIUM SULFATE 40 % (W/V), 30 % (W/W) ORAL SUSPENSION
40 % (w/v) | Freq: Once | ORAL | Status: AC
Start: 2015-03-07 — End: 2015-03-07
  Administered 2015-03-07: 14:00:00 via ORAL

## 2015-03-07 MED ORDER — BARIUM SULFATE 40 % (W/V), 29% (W/W) (3,000 CPS) ORAL SUSPENSION
40 % (w/v) 29% (w/w) | Freq: Once | ORAL | Status: AC
Start: 2015-03-07 — End: 2015-03-07
  Administered 2015-03-07: 14:00:00 via ORAL

## 2015-03-07 MED ORDER — IPRATROPIUM-ALBUTEROL 2.5 MG-0.5 MG/3 ML NEB SOLUTION
2.5 mg-0.5 mg/3 ml | Freq: Four times a day (QID) | RESPIRATORY_TRACT | Status: DC | PRN
Start: 2015-03-07 — End: 2015-03-17

## 2015-03-07 MED FILL — CHLORDIAZEPOXIDE 10 MG CAP: 10 mg | ORAL | Qty: 1

## 2015-03-07 MED FILL — LOVENOX 40 MG/0.4 ML SUBCUTANEOUS SYRINGE: 40 mg/0.4 mL | SUBCUTANEOUS | Qty: 0.4

## 2015-03-07 MED FILL — LEVETIRACETAM 500 MG/5 ML IV SOLN: 500 mg/5 mL | INTRAVENOUS | Qty: 5

## 2015-03-07 MED FILL — HALOPERIDOL LACTATE 5 MG/ML IJ SOLN: 5 mg/mL | INTRAMUSCULAR | Qty: 1

## 2015-03-07 MED FILL — LORAZEPAM 2 MG/ML IJ SOLN: 2 mg/mL | INTRAMUSCULAR | Qty: 1

## 2015-03-07 MED FILL — TAGITOL V 40 % (W/V), 30 % (W/W) ORAL SUSPENSION: 40 % (w/v) | ORAL | Qty: 60

## 2015-03-07 MED FILL — VARIBAR PUDDING 40 % (W/V), 30% (W/W) ORAL PASTE: 40 % (w/v), 30% (w/w) | ORAL | Qty: 15

## 2015-03-07 MED FILL — E-Z-HD BARIUM 98 % ORAL SUSPENSION: 98 % | ORAL | Qty: 45

## 2015-03-07 MED FILL — MONOJECT PREFILL ADVANCED (PF) 100 UNIT/ML INTRAVENOUS SYRINGE: 100 unit/mL | INTRAVENOUS | Qty: 6

## 2015-03-07 MED FILL — SOLU-MEDROL (PF) 40 MG/ML SOLUTION FOR INJECTION: 40 mg/mL | INTRAMUSCULAR | Qty: 1

## 2015-03-07 MED FILL — IPRATROPIUM-ALBUTEROL 2.5 MG-0.5 MG/3 ML NEB SOLUTION: 2.5 mg-0.5 mg/3 ml | RESPIRATORY_TRACT | Qty: 3

## 2015-03-07 MED FILL — NICOTINE 21 MG/24 HR DAILY PATCH: 21 mg/24 hr | TRANSDERMAL | Qty: 1

## 2015-03-07 MED FILL — VARIBAR THIN HONEY 40 % (W/V), 29 % (W/W) (1,500 CPS) ORAL SUSPENSION: 40 %(w/v), 29% (w/w)(1500 CPS) | ORAL | Qty: 15

## 2015-03-07 NOTE — Progress Notes (Signed)
Hospitalist Progress Note    03/07/2015  Admit Date: 02/24/2015 10:47 PM   NAME: Brett Becker   DOB:  01/17/1961   MRN:  [id number]   Attending: Caffie Pinto, DO  PCP:  Phys Other, MD    SUBJECTIVE:   Patient is a 54 y.o. white?? male who presents with altered mental status.?? Pt has prior admission by me for DT's in 2014.?? He was brought to the ED by a friend due to diminished responsiveness and seizure at home.?? No family or friends present at this time.?? Pt able to give limited history due to lethargy.?? He denies ever having had a seizure in his life, though he has had documented seizures previously.?? Pt states he normally drinks 6-8 beers per day and hadn't been drinking as much today.?? He denies chest pain, dyspnea, nausea, vomiting, abdominal pain.?? He reports normally having a tremor, but it is worse at the present moment.?? He also reports to me that he saw a red ball bouncing around his house today, but knew that it wasn't actually there.    3/16 - Patient in bed with restraints, looks agitated, stating he wants to a knife to but the restraints. Does not threaten staff though, I have stressed that he needs to relax and rest. Mild tremors this am, CIWA protocol.??     3/17 - Resting comfortably this am, decrease in BL Wheezing on exam, Sats improved    3/18 - Patient more confused as prior days I don't appreciate any focal deficits at this time, will obtain a CT brain w/o contrast, and ammonia levels. Continue CIWA protocol, LFTs trending down.     3/19 - Patient knocking off his NC, will increased Systemic steroids as he did not tolerate titration. Maintain o2 sat>90%. Sedated this am, and restraints orders renewed.     3/20 - Patient more alert today than previous 2 days, again has taken his NC off.     3/21- TPN, MDS today via SW, less agitated this am, continue CIWA protocol. Failed MDS, switched meds to IV or topical many were already  ????   03/22 - seen. Still tremulous; not tolerating po as per Speech & swallow;     03/23- patient failed speech and swallow eval again today. He is more coherent and is very upset with staff because he cannot eat. 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 Speech advised NGT until Friday and then re-evaluate; pt will need placement    03/24- patient aggravated overnight- pulled out NGT. It was replaced. He is much more coherent and is very upset with not being able to eat; he has a lot of secretions; plan to repeat swallow eval; He wants to have a drink.    03/07/2015 - agitated overnight; pulled out ngt several times; pt had swallow eval -did better than last time; will dc NGT and give a trail of feeds;    Review of Systems negative with exception of pertinent positives noted above  PHYSICAL EXAM   BP 119/79 mmHg   Pulse 73   Temp(Src) 97.6 ??F (36.4 ??C)   Resp 19   Ht  (1.727 m)   Wt 51.937 kg (114 lb 8 oz)   BMI 17.41 kg/m2   SpO2 91%   Temp (24hrs), Avg:97.9 ??F (36.6 ??C), Min:97.4 ??F (36.3 ??C), Max:98.8 ??F (37.1 ??C)    Oxygen  Therapy  O2 Sat (%): 91 % (03/07/15 0348)  Pulse via Oximetry: 79 beats per minute (03/07/15 0136)  O2 Device: Nasal cannula (03/07/15 0136)  O2 Flow Rate (L/min): 2 l/min (03/07/15 0136)    Intake/Output Summary (Last 24 hours) at 03/07/15 0631  Last data filed at 03/06/15 1930   Gross per 24 hour   Intake   1132 ml   Output      0 ml   Net   1132 ml      General: No acute distress??; thin emaciated; frail  Lungs:  CTA Bilaterally.   Heart:  Regular rate and rhythm,?? No murmur, rub, or gallop  Abdomen: Soft, Non distended, Non tender, Positive bowel sounds  Extremities: No cyanosis, clubbing or edema  Neurologic:?? No focal deficits    ASSESSMENT      Active Hospital Problems    Diagnosis Date Noted   ??? 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 Hyponatremia 02/25/2015   ??? Hepatitis C 08/31/2013    ??? HTN (hypertension) 08/30/2013   ??? Alcohol abuse 08/28/2013     Plan:  ASSESSMENT ??   ????  Active Hospital Problems??   ?? Diagnosis?? Date Noted??   ????? Delirium tremens ([facility])?? 02/25/2015??   ????? Hyponatremia?? 02/25/2015??   ????? Hepatitis C?? 08/31/2013??   ????? HTN (hypertension)?? 08/30/2013??   ????? Alcohol abuse?? 08/28/2013??     Plan:  Continue IVFL;   TPN ??discontinued  Pt was  NPO - failed speech and swallow eval previously did better today; ??will give a trial of feeds  MRI brain no acute infarct  Further management as w/up dictates  Continue IV Ativan ??  Heavy secretions Scopolamine patch    DVT prophylaxis: Lovenox  Code Status: FULL      Dipso- patient will need placement            Francoise SchaumannMarissa S Jamar Casagrande, MD

## 2015-03-07 NOTE — Progress Notes (Addendum)
Problem: Dysphagia (Adult)  Goal: *Acute Goals and Plan of Care (Insert Text)  STG: Pt will tolerate mechanical soft/honey thick liquids without signs/sx of aspiration with 100% accuracy  STG: Pt will participate with modified barium swallow study x1 (goal met 03/07/15)  STG: Pt will participate with laryngeal exercises x10 with 90% accuracy  LTG: Pt will tolerate the least restrictive diet at discharge without respiratory compromise    SPEECH LANGUAGE PATHOLOGY: MODIFIED BARIUM SWALLOW STUDY: REASSESSMENT    NAME/AGE/GENDER: Brett Becker is a 54 y.o. 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  NG tube in place; pt continually pulling it out yesterday.    Based on the objective data described below, Brett Becker presents with silent aspiration of thin liquids via the spoon.  Deep laryngeal penetration during the swallow with nectar liquids by spoon.  Patient had improved tolerance with honey thick liquids and improved epiglottic inversion compared with previous study as no penetration or aspiration during the swallow; however, trace laryngeal penetration of pyriform sinus residual noted.  Improved pharyngeal clearance with pudding compared with previous assessment with only mild residual in valleculae.  Increased vallecular residue with the mixed consistencies.  No penetration or aspiration with solid consistencies.   Recommend pureed/honey thick liquids by spoon or small cup sip as safest po diet at this time.  Discussed with MD that patient is approaching baseline level of function, is not an ideal PEG candidate, and does not wish for artificial nutrition.  Strict aspiration precautions recommended.   MD in agreement.  Will follow for laryngeal exercises.    Patient will benefit from skilled intervention to address the below impairments.  ????????This section established at most recent assessment??????????  RECOMMENDATIONS AND PLANNED INTERVENTIONS (Benefits and precautions of therapy have been discussed with the patient.):  ?? PO:  Pureed and Liquids:  honey by spoon or small cup sip  MEDICATIONS:  ?? Crushed in puree  COMPENSATORY STRATEGIES/MODIFICATIONS INCLUDING:  ?? Effortful swallow  ?? Small sips and bites  OTHER RECOMMENDATIONS (including follow up treatment recommendations):   ?? Tongue based expressions  ?? Family training/education  ?? Laryngeal exercises  ?? Patient education  FREQUENCY/DURATION: Continue to follow patient 3-5x a week to address above goals.   RECOMMENDED REHABILITATION/EQUIPMENT: (at time of discharge pending progress):   Rehab.      SUBJECTIVE:   Confused but cooperative.  History of Present Injury/Illness: Brett Becker  has a past medical history of Delirium tremens [facility]) (September, 2014) and Gastric ulcer (September, 2014).  He also  has past surgical history that includes neurological procedure unlisted; back surgery; and endoscopy (September, 2014).   Present Symptoms: aspiration on previous MBS  Pain Intensity 1: 0  Pain Location 1: Hand  Pain Orientation 1: Left, Right  Pain Intervention(s) 1: MD notified (comment) (paging MD)    Current Dietary Status:  NPO           ?? History of reflux:  no  Social History/Home Situation: home alone   Home Environment: Private residence  [place]: One story  Living Alone: Yes  Support Systems: Friends \\ neighbors  Patient Expects to be Discharged to:: Apartment  Current DME Used/Available at Home: Cane, straight, Walker, rolling      OBJECTIVE:  Cognitive/Communication Status:  Mental Status  Neurologic State: Alert  Orientation Level: Oriented to person  Cognition: Decreased attention/concentration  Perception: Appears intact   Perseveration: No perseveration noted  Safety/Judgement: Decreased insight into deficits    Oral Assessment:  Oral Assessment  Labial: Impaired coordination  Dentition: Limited  Lingual: Decreased rate  Mandible: No impairment  Gag Reflex: No impairment    Vocal Quality: low    Patient Viewed: Patient Position: upright in chair  Film Views: Lateral, Fluoro    Oral Prepatory:  The patient was given the following: Consistency Presented: Nectar thick liquid, Mixed consistency, Pudding, Honey thick liquid, Thin liquid  How Presented: Cup/sip, Spoon, Straw, Self-fed/presented    Oral Phase:  Bolus Acceptance: No impairment  Bolus Formation/Control: Impaired  Propulsion: Delayed (# of seconds)  Type of Impairment: Delayed, Mastication  Oral Residue: None  Initiation of Swallow: Triggered at vallecula, Triggered at pyriform sinus(es)  Oral Phase Severity: Mild    Pharyngeal Phase:  Timing: Pooling 1-5 sec  Decreased Tongue Base Retraction?: Yes  Laryngeal Elevation: Incomplete laryngeal closure, Inadequate epiglottic inversion, Reduced excursion with laryngeal vestibule gap  Penetration: Trace, During swallow, To cords, From initial swallow  Aspiration/Timing: Silent   Aspiration/Penetration Score: 8 (Aspiration-Contrast passes cords/glottis with no effort to eject, ie/silent aspiration)  Pharyngeal Symmetry: Not assessed  Pharyngeal Dysfunction: Decreased tongue base retraction, Decreased strength, Decreased elevation/closure, Decreased pharyngeal wall constriction  Pharyngeal Phase Severity: Moderately severe  Pharyngeal-Esophageal Segment: No impairment    Assessment/Reassessment only, no treatment provided today    Tool Used: Dysphagia Outcome and Severity Scale (DOSS)     Score Comments   Normal Diet  '[ ]'  7 With no strategies or extra time needed        Functional Swallow  '[ ]'  6 May have mild oral or pharyngeal delay        Mild Dysphagia     '[ ]'  5 Which may require one diet consistency restricted (those who  demonstrate penetration which is entirely cleared on MBS would be included)   Mild-Moderate Dysphagia  '[ ]'  4 With 1-2 diet consistencies restricted        Moderate Dysphagia  '[ ]'  3 With 2 or more diet consistencies restricted        Moderately Severe Dysphagia  '[X]'  2 With partial PO strategies (trials with ST only)        Severe Dysphagia  '[ ]'  1 With inability to tolerate any PO safely           Score:  Initial: 2 Most Recent: X (Date: -- )   Interpretation of Tool: The Dysphagia Outcome and Severity Scale (DOSS) is a simple, easy-to-use, 7-point scale developed to systematically rate the functional severity of dysphagia based on objective assessment and make recommendations for diet level, independence level, and type of nutrition.       Score '7 6 5 4 3 2 1   ' Modifier CH CI CJ CK CL CM CN   ?? Swallowing:              K7425 - CURRENT STATUS:           CM - 80%-99% impaired, limited or restricted              Z5638 - GOAL STATUS:                   CK - 40%-59% impaired, limited or restricted  J0093 - D/C STATUS:                       ---------------To be determined---------------  Payor: ADVICARE MEDICAID / Plan: [place] ADVICARE [place]ICA[place] / Product Type: Managed Care Medicaid /   __________________________________________________________________________________________________  Safety:   After treatment position/precautions:  ?? RN notified  ?? Upright in Bed  Recommendations for treatment: laryngeal exercises  Total Treatment Duration:  Time In: 1015   Time Out: [place] MS, CCC-SLP

## 2015-03-07 NOTE — Progress Notes (Signed)
Dob Hoff placed and awaiting for xray for placement verification.

## 2015-03-07 NOTE — Progress Notes (Signed)
Pt pulled out NG tube placed. MD Notified Restraints ordered.

## 2015-03-07 NOTE — Progress Notes (Addendum)
Pt pulled out Dob Hoff, MD notified, NG placed, and Mitts reapplied.

## 2015-03-07 NOTE — Progress Notes (Signed)
MD notified this RN and verbally stated that Dob Hoff is in place and can be used.  RN will reconnect tube feed and monitor patient. Mitts are in placed.

## 2015-03-07 NOTE — Progress Notes (Signed)
SPEECH PATHOLOGY NOTE:    Patient assisted with his lunch tray.  Ate 75% of his meal.  Recommend 1:1 assistance with meals.    Reginia NaasShannon Ferrill MS, CCC-SLP

## 2015-03-07 NOTE — Progress Notes (Addendum)
SW spoke with Brett Becker, pt's insurance case manager [phone], [phone]).  Pt has been approved for [facility] by the insurance company but a single case agreement is needed between [facility] and the insurance company since [facility] not an [facility]-network facility.  This agreement has been initiated but it is highly unlikely that it will be finalized until Monday at the earliest.  SW following.    1348:  SW [facility] from [facility] [employer].  She stated that her supervisor told her that the pt "probably" would not be approved [facility].  She stated that if LTACH is truly needed he would need to be [facility] since they are in-network.  Brett Becker stated that she feels the pt does not meet LTACH criteria and SW should pursue SNF placement.  SW submitted a referral to [facility] which is the closest SNF to the dtr's home that is in-network with pt's insurance.  Awaiting a response.  SW notified MD of this information. SW following.

## 2015-03-07 NOTE — Progress Notes (Signed)
KUB performed to verify placement of Dob Hoff, mitts placed to hands bilaterally so patient does not pull out Dob Hoff will await MD to verify placement xray.

## 2015-03-08 LAB — METABOLIC PANEL, BASIC
Anion gap: 7 mmol/L (ref 7–16)
BUN: 26 MG/DL — ABNORMAL HIGH (ref 6–23)
CO2: 26 mmol/L (ref 21–32)
Calcium: 8.4 MG/DL (ref 8.3–10.4)
Chloride: 104 mmol/L (ref 98–107)
Creatinine: 0.46 MG/DL — ABNORMAL LOW (ref 0.8–1.5)
GFR est AA: >60 mL/min/{1.73_m2} (ref >60)
GFR est non-AA: >60 mL/min/{1.73_m2} (ref >60)
Glucose: 127 mg/dL — ABNORMAL HIGH (ref 65–100)
Potassium: 3.6 mmol/L (ref 3.5–5.1)
Sodium: 137 mmol/L (ref 136–145)

## 2015-03-08 MED FILL — CHLORDIAZEPOXIDE 10 MG CAP: 10 mg | ORAL | Qty: 1

## 2015-03-08 MED FILL — LEVETIRACETAM 500 MG/5 ML IV SOLN: 500 mg/5 mL | INTRAVENOUS | Qty: 5

## 2015-03-08 MED FILL — SOLU-MEDROL (PF) 40 MG/ML SOLUTION FOR INJECTION: 40 mg/mL | INTRAMUSCULAR | Qty: 1

## 2015-03-08 MED FILL — LORAZEPAM 2 MG/ML IJ SOLN: 2 mg/mL | INTRAMUSCULAR | Qty: 1

## 2015-03-08 MED FILL — MONOJECT PREFILL ADVANCED (PF) 100 UNIT/ML INTRAVENOUS SYRINGE: 100 unit/mL | INTRAVENOUS | Qty: 6

## 2015-03-08 MED FILL — NICOTINE 21 MG/24 HR DAILY PATCH: 21 mg/24 hr | TRANSDERMAL | Qty: 1

## 2015-03-08 MED FILL — LOVENOX 40 MG/0.4 ML SUBCUTANEOUS SYRINGE: 40 mg/0.4 mL | SUBCUTANEOUS | Qty: 0.4

## 2015-03-08 NOTE — Progress Notes (Signed)
Hospitalist Progress Note    03/08/2015  Admit Date: 02/24/2015 10:47 PM   NAME: Brett Becker   DOB:  [birth year]   MRN:  [id number]   Attending: Caffie Pinto, DO  PCP:  Phys Other, MD    SUBJECTIVE:   Patient Patient is a 54 y.o. white?? male who presents with altered mental status.?? Pt has prior admission by me for DT's in 2014.?? He was brought to the ED by a friend due to diminished responsiveness and seizure at home.?? No family or friends present at this time.?? Pt able to give limited history due to lethargy.?? He denies ever having had a seizure in his life, though he has had documented seizures previously.?? Pt states he normally drinks 6-8 beers per day and hadn't been drinking as much today.?? He denies chest pain, dyspnea, nausea, vomiting, abdominal pain.?? He reports normally having a tremor, but it is worse at the present moment.?? He also reports to me that he saw a red ball bouncing around his house today, but knew that it wasn't actually there.    3/16 - Patient in bed with restraints, looks agitated, stating he wants to a knife to but the restraints. Does not threaten staff though, I have stressed that he needs to relax and rest. Mild tremors this am, CIWA protocol.??     3/17 - Resting comfortably this am, decrease in BL Wheezing on exam, Sats improved    3/18 - Patient more confused as prior days I don't appreciate any focal deficits at this time, will obtain a CT brain w/o contrast, and ammonia levels. Continue CIWA protocol, LFTs trending down.     3/19 - Patient knocking off his NC, will increased Systemic steroids as he did not tolerate titration. Maintain o2 sat>90%. Sedated this am, and restraints orders renewed.     3/20 - Patient more alert today than previous 2 days, again has taken his NC off.     3/21- TPN, MDS today via SW, less agitated this am, continue CIWA protocol. Failed MDS, switched meds to IV or topical many were already  ????   03/22 - seen. Still tremulous; not tolerating po as per Speech & swallow;     03/23- patient failed speech and swallow eval again today. He is more coherent and is very upset with staff because he cannot eat. 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 Speech advised NGT until Friday and then re-evaluate; pt will need placement    03/24- patient aggravated overnight- pulled out NGT. It was replaced. He is much more coherent and is very upset with not being able to eat; he has a lot of secretions; plan to repeat swallow eval; He wants to have a drink.    03/07/2015 - agitated overnight; pulled out ngt several times; pt had swallow eval -did better than last time; will dc NGT and give a trail of feeds;    03/08/2015- seen. Less aggravated overnight. Tolerating po. Less agitated      Review of Systems negative with exception of pertinent positives noted above  PHYSICAL EXAM   BP 146/74 mmHg   Pulse 67   Temp(Src) 98.4 ??F (36.9 ??C)   Resp 18   Ht  (1.727 m)   Wt 51.937 kg (114 lb 8 oz)   BMI 17.41 kg/m2   SpO2 97%   Temp (24hrs), Avg:97.8 ??  F (36.6 ??C), Min:97.2 ??F (36.2 ??C), Max:98.4 ??F (36.9 ??C)    Oxygen Therapy  O2 Sat (%): 97 % (03/08/15 1516)  Pulse via Oximetry: 79 beats per minute (03/07/15 0136)  O2 Device: Nasal cannula (03/07/15 0136)  O2 Flow Rate (L/min): 2 l/min (03/07/15 0136)  No intake or output data in the 24 hours ending 03/08/15 1809   General: No acute distress????  Lungs:  CTA Bilaterally.   Heart:  Regular rate and rhythm,?? No murmur, rub, or gallop  Abdomen: Soft, Non distended, Non tender, Positive bowel sounds  Extremities: No cyanosis, clubbing or edema  Neurologic:?? No focal deficits    ASSESSMENT      Active Hospital Problems    Diagnosis Date Noted   ??? 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 Hyponatremia 02/25/2015   ??? Hepatitis C 08/31/2013   ??? HTN (hypertension) 08/30/2013    ??? Alcohol abuse 08/28/2013     Plan:  Continue IVFL;   TPN ??discontinued  Pt was?? 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  Continue Scopolamine patch    DVT prophylaxis: Lovenox  Code Status: FULL      Dipso- patient will need placement??; unable to care for self needs lots of assistance   ??                 Francoise SchaumannMarissa S Beanca Kiester, MD

## 2015-03-08 NOTE — Progress Notes (Signed)
Problem: Self Care Deficits Care Plan (Adult)  Goal: *Acute Goals and Plan of Care (Insert Text)  1. Patient will feed self entire meal with set-up.   2. Patient will complete grooming with set-up.   3. Patient will tolerate 15 minutes of OT treatment with no rest breaks to increase activity tolerance for ADLs.   4. Patient will complete functional transfers with minimal assistance and adaptive equipment as needed.   5. Patient will demonstrate fair standing balance to increase safety during standing ADLs.     Timeframe: 7 visits   OCCUPATIONAL THERAPY: Daily Note, Treatment Day: 6th and AM  INPATIENT: Medicaid : Hospital Day: 13    NAME/AGE/GENDER: Brett Becker is a 54 y.o. male  DATE: 03/08/2015  PRIMARY DIAGNOSIS: Delirium tremens ([place])  Delirium tremens ([place])  Delirium tremens ([place])        ICD-10: Treatment Diagnosis:   Unspecified lack of coordination (R27.9)  Dizziness and giddiness (R42)  Muscle weakness (generalized) (M62.81)      INTERDISCIPLINARY COLLABORATION: Physical Therapy Assistant, Certified Occupational Therapy Assistant and Registered Nurse  ASSESSMENT:   [name] was presented in supine upon arrival. Pt transferred to sitting with moderate assistance x's 2. Pt initially with very poor balance with R lateral lean and posterior lean. Pt's balance improved slightly after therapist completed manual cues to increase neck and trunk extension. Pt sat EOB approximately 15 minutes with decreased ability to follow commands. Pt required maximal assistance x's 2 to complete sit to stand and transfer from bed to chair. Pt with extremely flexed knees during transfer. Pt left up in the chair with posey pad, tab alert, and lap belt in place. Pt will continue to benefit from OT to increase progression toward above goals.  ????????This section established at most recent assessment??????????  PROBLEM LIST (Impairments causing functional limitations):  1. Decreased Strength effecting function   2. Decreased ADL/Functional Activities  3. Decreased Transfer Abilities  4. Decreased Ambulation Ability/Technique  5. Decreased Balance  6. Decreased Activity Tolerance  7. Impaired balance affecting function  8. Impaired coordination affecting function  REHABILITATION POTENTIAL FOR STATED GOALS: GUARDED  PLAN OF CARE:  INTERVENTIONS PLANNED: (Benefits and precautions of occupational therapy have been discussed with the patient.)  1. Activities of daily living training  2. Adaptive equipment training  3. Cognitive training  4. Donning&doffing training  5. Group therapy  6. Theraputic activity  7. Theraputic exercise  FREQUENCY/DURATION: Follow patient 1-2 times per day/3-5 days per week until goals are met in order to address above goals.    RECOMMENDED REHABILITATION/EQUIPMENT: (at time of discharge pending progress):   Continue OT.  SUBJECTIVE:   Agreed to treatment.    History of Present Injury/Illness: Per H&P, "Patient is a 54 y.o. white  male who presents with altered mental status.  Pt has prior admission by me for DT's in 2014.  He was brought to the ED by a friend due to diminished responsiveness and seizure at home.  No family or friends present at this time.  Pt able to give limited history due to lethargy.  He denies ever having had a seizure in his life, though he has had documented seizures previously.  Pt states he normally drinks 6-8 beers per day and hadn't been drinking as much today.  He denies chest pain, dyspnea, nausea, vomiting, abdominal pain.  He reports normally having a tremor, but it is worse at the present moment.  He also reports to me that he saw a red  ball bouncing around his house today, but knew that it wasn't actually there."  Present Symptoms: confusion    Pain Intensity 1: 0  Prior Level of Function/Home Situation: Patient is a poor historian but reports that he lives with his friend. He says he is independent with ADLs. He uses a bike for communication and does not drive.     Home Environment: Private residence  One/Two Story Residence: One story  Living Alone: Yes  Support Systems: Friends \\ neighbors  Patient Expects to be Discharged to:: Apartment  Current DME Used/Available at Home: Cane, straight, Walker, rolling  OBJECTIVE/TREATMENT:   Therapeutic Activity: (    15 minutes):  Therapeutic activities including Bed transfers, Chair transfers and EOB sitting balance to improve mobility, strength and balance.  Required moderate Safety awareness training;Verbal cues to promote static balance in sitting.   Cognitive Skills Development: (14 minutes): Procedure(s) (per grid) utilized to improve and/or restore cognitive functioning as related to ability to follow simple commands, attention to tasks, problem solving skills, memory, safety awareness and self awareness. Required moderate visual, verbal, manual and   cueing to facilitate perform graded activities focusing on attention skills.      Exercises completed with yellow theraband Date:  03/04/2015 Date:   Date:     Activity/Exercise Parameters Parameters Parameters   Horizontal Shoulder Abd/Adduction 15 reps     Shoulder Flexion 10 reps     Elbow Flexion 15 reps     Punches 10 reps                                                                         [place] AM-PACTM "6 Clicks"                                                       Daily Activity Inpatient Short Form  How much help from another person does the patient currently need... Total A Lot A Little None   1.  Putting on and taking off regular lower body clothing?   '[X]'  1   '[ ]'  2   '[ ]'  3   '[ ]'  4   2.  Bathing (including washing, rinsing, drying)?   '[X]'  1   '[ ]'  2   '[ ]'  3   '[ ]'  4   3.  Toileting, which includes using toilet, bedpan or urinal?   '[X]'  1   '[ ]'  2   '[ ]'  3   '[ ]'  4   4.  Putting on and taking off regular upper body clothing?   '[ ]'  1   '[X]'  2   '[ ]'  3   '[ ]'  4   5.  Taking care of personal grooming such as brushing teeth?   '[ ]'  1   '[X]'  2   '[ ]'  3   '[ ]'  4    6.  Eating meals?   '[ ]'  1   '[X]'  2   '[ ]'  3   '[ ]'  4   ?? 2007, [employer], under license  to [employer]. All rights reserved       Score:  Initial: 9 Most Recent: X (Date: -- )   Interpretation of Tool:  Represents clinically-significant functional categories (i.e.Activities of daily living).  Score 24 23 22-20 19-14 13-9 8-7 6   Modifier CH CI CJ CK CL CM CN       ?? Self Care:              [phone] - CURRENT STATUS:           CL - 60%-79% impaired, limited or restricted              [id number] - GOAL STATUS:                   CK - 40%-59% impaired, limited or restricted              [id number] - D/C STATUS:                       ---------------To be determined---------------  Payor: [employer] MEDICAID / Plan: [place] / Product Type: Managed Care Medicaid /      Balance  Sitting: Impaired  Sitting - Static: Prop sitting  Sitting - Dynamic: Fair (occasional)  Standing: Impaired  Standing - Static: Poor  Standing - Dynamic : Poor       Patient Vitals for the past 6 hrs:   BP BP Patient Position SpO2 Pulse   03/08/15 1109 130/69 mmHg At rest 94 % 62                           Most Recent Physical Functioning:   Gross Assessment:  AROM: Generally decreased, functional (BUE)  Strength: Generally decreased, functional (BUE)  Coordination: Generally decreased, functional (BUE)               Posture:     Balance:     Bed Mobility:     Wheelchair Mobility:     Transfers:               Basic ADLs (From Assessment) Complex ADLs (From Assessment)   Basic ADL  Feeding: Moderate assistance  Oral Facial Hygiene/Grooming: Moderate assistance  Bathing: Maximum assistance  Upper Body Dressing: Maximum assistance  Lower Body Dressing: Maximum assistance  Toileting: Total assistance Instrumental ADL  Meal Preparation: Total assistance  Homemaking: Total assistance  Medication Management: Total assistance  Financial Management: Total assistance   Grooming/Bathing/Dressing Activities of Daily Living                              Bed/Mat Mobility  Rolling: Moderate assistance;Assist x2  Supine to Sit: Moderate assistance;Assist x2  Sit to Stand: Moderate assistance;Maximum assistance;Assist x2  Bed to Chair: Maximum assistance;Assist x2  Scooting: Moderate assistance;Assist x2          Braces/Orthotics/Lines/Other:   ?? Hand Dominance: unknown  ?? IV  ?? O2 Device: Nasal cannula    Safety:   After treatment position/precautions:  ?? Up in chair, Bed alarm/tab alert on, Bed/Chair-wheels locked, Call light within reach, RN notified and lap belt and tab alert in place    Progression/Medical Necessity:   ?? Patient demonstrates fair rehab potential due to higher previous functional level.  Compliance with Program/Exercises: noncompliant some of the time.   Reason for Continuation of Services/Other Comments:  ?? Patient continues  to require present interventions due to patient's inability to care for self without assistance.  Recommendations/Intent for next treatment session: Treatment next visit will focus on advancements to more challenging activities and reduction in assistance provided.  Total Treatment Duration:  Time In: 1037  Time Out: 1106  [place], [place]

## 2015-03-08 NOTE — Progress Notes (Signed)
Problem: Mobility Impaired (Adult and Pediatric)  Goal: *Acute Goals and Plan of Care (Insert Text)  Discharge Goals:  (1.)[name] will move from supine to sit and sit to supine , scoot up and down and roll side to side with INDEPENDENT within 7 day(s).   (2.)[name] will transfer from bed to chair and chair to bed with MODIFIED INDEPENDENCE using the least restrictive device within 7 day(s).   (3.)[name] will ambulate with MODIFIED INDEPENDENCE for 150+ feet with the least restrictive device within 7 day(s).   (4.)[name] will tolerate 25+ minutes of therapeutic activity/exercise while maintaining stable vitals to improve functional strength and endurance within 7 day(s).  (5.)[name] will demonstrate good dynamic standing balance to safely perform functional mobility and ambulation within 7 day(s).  ________________________________________________________________________________________________  PHYSICAL THERAPY: Daily Note, Treatment Day: 5th and AM  INPATIENT: Medicaid : Hospital Day: 13    NAME/AGE/GENDER: Brett Becker is a 54 y.o. 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 presents supine in bed and agreeable to PT treatment session. [name] presents supine in bed with restraint across chest. Performed bed mobility with mod-max Ax2. Worked on sitting balance, with tactile and manual cueing needed about every 30 seconds to maintain an upright sitting posture. He leaned to the R and would prop on his R UE. He was able to sit straight up with therapist holding his hands on his knees for about 30 seconds before leaning to the R again. Fatigued very easily with sitting  balance. He then transferred to the recliner with max Ax2 with support under both arms. Very contracted LEs. Once in chair he was able to scoot backwards independently, with extra time. Left with all needs met and restraint around his chest. RN notified. Continues to demonstrate decreased functional mobility and balance/gait status from baseline. Recommend continued skilled acute PT services to optimize functional independence and safety. Will continue to follow and progress toward stated goals.    ????????This section established at most recent assessment??????????  PROBLEM LIST (Impairments causing functional limitations):  1. Decreased Strength effecting function  2. Decreased ADL/Functional Activities  3. Decreased Transfer Abilities  4. Decreased Ambulation Ability/Technique  5. Decreased Balance  6. Decreased Activity Tolerance  7. Increased Fatigue effecting function  8. Increased Shortness of Breath affecting function  9. Decreased Knowledge of precautions  10. Decreased Independence with Home Exercise Program  REHABILITATION POTENTIAL FOR STATED GOALS: GOOD      PLAN OF CARE:   INTERVENTIONS PLANNED: (Benefits and precautions of physical therapy have been discussed with the patient.)  1. balance exercise  2. bed mobility  3. family education  4. gait training  5. neuromuscular re-education/strengthening  6. range of motion: active/assisted/passive  7. therapeutic activities  8. therapeutic exercise/strengthening  9. transfer training  FREQUENCY/DURATION: Follow patient 1-2 times per day/4-7 days per week until goals are met in order to address above goals.    RECOMMENDED REHABILITATION/EQUIPMENT: (at time of discharge pending progress):   TBD pending patient progress. Continue skilled acute PT services..  SUBJECTIVE:   "uh huh."    Present Symptoms: endorses "no" 0/10 pain   Pain Intensity 1: 0     History of Present Injury/Illness: Per  chart:   Patient is a 54 y.o. white  male who presents with altered mental status.  Pt has prior admission by me for DT's in 2014.  He was brought to the ED by a friend due to diminished responsiveness and seizure at home.  No family or friends present at this time.  Pt able to give limited history due to lethargy.  He denies ever having had a seizure in his life, though he has had documented seizures previously.  Pt states he normally drinks 6-8 beers per day and hadn't been drinking as much today.  He denies chest pain, dyspnea, nausea, vomiting, abdominal pain.  He reports normally having a tremor, but it is worse at the present moment.  He also reports to me that he saw a red ball bouncing around his house today, but knew that it wasn't actually there.      Past Medical History    Diagnosis  Date    ???  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  Found to have GI bleeding secondary to gastric ullcers.    ???  Gastric ulcer  September, 2014        Found at EGD to have major gastric ulcers.        Past Surgical History    Procedure  Laterality  Date    ???  Pr neurological procedure unlisted        ???  Hx back surgery            x 2    ???  Hx endoscopy    September, 2014              Prior Level of Function/Home Situation:   History provided by patient/chart; patient is a poor historian at time of initial assessment.  Patient live alone in a private residence/apartment.  At baseline, independent with ADLs and ambulates with a straight cane.  Home Environment: Private residence  One/Two Story Residence: One story  Living Alone: Yes  Support Systems: Friends \\ neighbors  Patient Expects to be Discharged to:: Apartment  Current DME Used/Available at Home: Cane, straight, Environmental consultant, rolling  OBJECTIVE/TREATMENT:   (In addition to Assessment/Re-Assessment sessions the following treatments were rendered)                                      [facility]??? ???6 Clicks???                                           Basic Mobility Inpatient Short Form  How much difficulty does the patient currently have... Unable A Lot A Little None   1.  Turning over in bed (including adjusting bedclothes, sheets and blankets)?   _0  1   _1  2   _2  3   _3  4   2.  Sitting down on and standing up from a chair with arms ( e.g., wheelchair, bedside commode, etc.)   _4  1   _5  2   _6  3   _7  4   3.  Moving from lying on back to sitting on the side of the bed?   _8  1   _9  2   _10   3   _0  4               How much help from another person does the patient currently need... Total A Lot A Little None   4.  Moving to and from a bed to a chair (including a wheelchair)?   _1  1   _2  2   _3  3   _4  4   5.  Need to walk in hospital room?   _5  1   _6  2   _7  3   _8  4   6.  Climbing 3-5 steps with a railing?   _9  1   _10  2   _11  3   _12  4   ?? 2007, [employer], under license to [place]. All rights reserved       Score:  Initial: 14 Most Recent: 14 (Date: 02/25/14 )   Interpretation of Tool:  Represents activities that are increasingly more difficult (i.e. Bed mobility, Transfers, Gait).  Score 24 23 22-20 19-15 14-10 9-7 6   Modifier CH CI CJ CK CL CM CN       ?? Mobility - Walking and Moving Around:              [id number] - CURRENT STATUS:       CL - 60%-79% impaired, limited or restricted              P3825 - GOAL STATUS:               CJ - 20%-39% impaired, limited or restricted              [id number] - D/C STATUS:                    ---------------To be determined---------------  Payor: ADVICARE MEDICAID / Plan: [place] ADVICARE MEDICAID / Product Type: Managed Care Medicaid /    Most Recent Physical Functioning:   Gross Assessment:                  Posture:  Posture (WDL): Exceptions to WDL  Posture Assessment: Forward head, Rounded shoulders, Kyphosis  Balance:  Sitting: Impaired  Sitting - Static: Prop sitting  Sitting - Dynamic: Fair (occasional)  Standing: Impaired  Standing - Static: Poor   Standing - Dynamic : Poor  Bed Mobility:  Rolling: Moderate assistance;Assist x2  Supine to Sit: Moderate assistance;Assist x2  Scooting: Moderate assistance;Assist x2  Wheelchair Mobility:     Transfers:  Sit to Stand: Moderate assistance;Maximum assistance;Assist x2  Stand to Sit: Moderate assistance;Maximum assistance;Assist x2  Bed to Chair: Maximum assistance;Assist x2  Gait:     Base of Support: Narrowed  Speed/Cadence: Shuffled;Slow  Step Length: Left shortened;Right shortened  Distance (ft): 3 Feet (ft)  Assistive Device:  (B HHAx2)  Ambulation - Level of Assistance: Maximum assistance;Assist x2  Interventions: Safety awareness training;Verbal cues   Therapeutic Activity: (    34 minutes):  Therapeutic activities including Bed transfers, Chair transfers and Ambulation on level ground  to improve mobility, strength, balance and coordination.  Required moderate Safety awareness training;Verbal cues to promote coordination of bilateral, lower extremity(s) and promote motor control of bilateral, lower extremity(s).     Braces/Orthotics/Lines/Etc:   ?? IV, nasogastric tube and PICC  Safety:   After treatment position/precautions:  ?? Up in chair, Bed alarm/tab alert on, Bed/Chair-wheels locked, Bed in low position, Call light within reach, RN  notified and restraint strap across chest  Progression/Medical Necessity:   ?? Patient demonstrates good rehab potential due to higher previous functional level.  Compliance with Program/Exercises: Will assess as treatment progresses.   Reason for Continuation of Services/Other Comments:  ?? Patient continues to require skilled intervention due to decreased functional mobility, coordination, and balance/gait status from baseline..  Recommendations/Intent for next treatment session: Treatment next visit will focus on advancements to more challenging activities and reduction in assistance provided.    Total Treatment Duration:  Time In: 1032  Time Out: [place], PTA

## 2015-03-09 LAB — METABOLIC PANEL, BASIC
Anion gap: 5 mmol/L — ABNORMAL LOW (ref 7–16)
BUN: 25 MG/DL — ABNORMAL HIGH (ref 6–23)
CO2: 24 mmol/L (ref 21–32)
Calcium: 6.4 MG/DL — ABNORMAL LOW (ref 8.3–10.4)
Chloride: 113 mmol/L — ABNORMAL HIGH (ref 98–107)
Creatinine: 0.37 MG/DL — ABNORMAL LOW (ref 0.8–1.5)
GFR est AA: >60 mL/min/{1.73_m2} (ref >60)
GFR est non-AA: >60 mL/min/{1.73_m2} (ref >60)
Glucose: 136 mg/dL — ABNORMAL HIGH (ref 65–100)
Potassium: 2.9 mmol/L — CL (ref 3.5–5.1)
Sodium: 142 mmol/L (ref 136–145)

## 2015-03-09 MED ORDER — POTASSIUM CHLORIDE 20 MEQ/100 ML IV PIGGY BACK
20 mEq/100 mL | Freq: Once | INTRAVENOUS | Status: AC
Start: 2015-03-09 — End: 2015-03-13
  Administered 2015-03-09: 17:00:00 via INTRAVENOUS

## 2015-03-09 MED ORDER — POTASSIUM CHLORIDE SR 20 MEQ TAB, PARTICLES/CRYSTALS
20 mEq | Freq: Once | ORAL | Status: AC
Start: 2015-03-09 — End: 2015-03-09
  Administered 2015-03-09: 13:00:00 via ORAL

## 2015-03-09 MED FILL — LOVENOX 40 MG/0.4 ML SUBCUTANEOUS SYRINGE: 40 mg/0.4 mL | SUBCUTANEOUS | Qty: 0.4

## 2015-03-09 MED FILL — SOLU-MEDROL (PF) 40 MG/ML SOLUTION FOR INJECTION: 40 mg/mL | INTRAMUSCULAR | Qty: 1

## 2015-03-09 MED FILL — LORAZEPAM 2 MG/ML IJ SOLN: 2 mg/mL | INTRAMUSCULAR | Qty: 1

## 2015-03-09 MED FILL — MONOJECT PREFILL ADVANCED (PF) 100 UNIT/ML INTRAVENOUS SYRINGE: 100 unit/mL | INTRAVENOUS | Qty: 6

## 2015-03-09 MED FILL — LEVETIRACETAM 500 MG/5 ML IV SOLN: 500 mg/5 mL | INTRAVENOUS | Qty: 5

## 2015-03-09 MED FILL — NICOTINE 21 MG/24 HR DAILY PATCH: 21 mg/24 hr | TRANSDERMAL | Qty: 1

## 2015-03-09 MED FILL — TRANSDERM-SCOP 1 MG OVER 3 DAYS TRANSDERMAL PATCH: 1 mg over 3 days | TRANSDERMAL | Qty: 1

## 2015-03-09 MED FILL — CHLORDIAZEPOXIDE 10 MG CAP: 10 mg | ORAL | Qty: 1

## 2015-03-09 MED FILL — KLOR-CON M20 MEQ TABLET,EXTENDED RELEASE: 20 mEq | ORAL | Qty: 2

## 2015-03-09 MED FILL — POTASSIUM CHLORIDE 20 MEQ/100 ML IV PIGGY BACK: 20 mEq/100 mL | INTRAVENOUS | Qty: 100

## 2015-03-09 NOTE — Progress Notes (Signed)
Hospitalist Progress Note    03/09/2015  Admit Date: 02/24/2015 10:47 PM   NAME: Brett Becker   DOB:  1961/11/25   MRN:  [id number]   Attending: Caffie Pinto, DO  PCP:  Phys Other, MD    SUBJECTIVE:   Patient is a 54 y.o. white?? male who presents with altered mental status.?? Pt has prior admission by me for DT's in 2014.?? He was brought to the ED by a friend due to diminished responsiveness and seizure at home.?? No family or friends present at this time.?? Pt able to give limited history due to lethargy.?? He denies ever having had a seizure in his life, though he has had documented seizures previously.?? Pt states he normally drinks 6-8 beers per day and hadn't been drinking as much today.?? He denies chest pain, dyspnea, nausea, vomiting, abdominal pain.?? He reports normally having a tremor, but it is worse at the present moment.?? He also reports to me that he saw a red ball bouncing around his house today, but knew that it wasn't actually there.    3/16 - Patient in bed with restraints, looks agitated, stating he wants to a knife to but the restraints. Does not threaten staff though, I have stressed that he needs to relax and rest. Mild tremors this am, CIWA protocol.??     3/17 - Resting comfortably this am, decrease in BL Wheezing on exam, Sats improved    3/18 - Patient more confused as prior days I don't appreciate any focal deficits at this time, will obtain a CT brain w/o contrast, and ammonia levels. Continue CIWA protocol, LFTs trending down.     3/19 - Patient knocking off his NC, will increased Systemic steroids as he did not tolerate titration. Maintain o2 sat>90%. Sedated this am, and restraints orders renewed.     3/20 - Patient more alert today than previous 2 days, again has taken his NC off.     3/21- TPN, MDS today via SW, less agitated this am, continue CIWA protocol. Failed MDS, switched meds to IV or topical many were already  ????   03/22 - seen. Still tremulous; not tolerating po as per Speech & swallow;     03/23- patient failed speech and swallow eval again today. He is more coherent and is very upset with staff because he cannot eat. 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 Speech advised NGT until Friday and then re-evaluate; pt will need placement    03/24- patient aggravated overnight- pulled out NGT. It was replaced. He is much more coherent and is very upset with not being able to eat; he has a lot of secretions; plan to repeat swallow eval; He wants to have a drink.    03/07/2015 - agitated overnight; pulled out ngt several times; pt had swallow eval -did better than last time; will dc NGT and give a trail of feeds;    03/08/2015- seen. Less aggravated overnight. Tolerating po. Less agitated    03/09/2015- pt is doing much better in terms of feeding and agitation; still confused at  Times off & on; awaiting placement    Review of Systems negative with exception of pertinent positives noted above  PHYSICAL EXAM   BP 120/66 mmHg   Pulse 76   Temp(Src) 98.1 ??F (36.7 ??C)   Resp 18   Ht  (1.727 m)  Wt 51.937 kg (114 lb 8 oz)   BMI 17.41 kg/m2   SpO2 96%   Temp (24hrs), Avg:97.7 ??F (36.5 ??C), Min:96.6 ??F (35.9 ??C), Max:98.7 ??F (37.1 ??C)    Oxygen Therapy  O2 Sat (%): 96 % (03/09/15 1528)  Pulse via Oximetry: 79 beats per minute (03/07/15 0136)  O2 Device: Nasal cannula (03/07/15 0136)  O2 Flow Rate (L/min): 2 l/min (03/07/15 0136)    Intake/Output Summary (Last 24 hours) at 03/09/15 1606  Last data filed at 03/09/15 0845   Gross per 24 hour   Intake    115 ml   Output      0 ml   Net    115 ml      General: No acute distress????  Lungs:  CTA Bilaterally.   Heart:  Regular rate and rhythm,?? No murmur, rub, or gallop  Abdomen: Soft, Non distended, Non tender, Positive bowel sounds  Extremities: No cyanosis, clubbing or edema   Neurologic:?? No focal deficits    ASSESSMENT      Active Hospital Problems    Diagnosis Date Noted   ??? 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 Hyponatremia 02/25/2015   ??? Hepatitis C 08/31/2013   ??? HTN (hypertension) 08/30/2013   ??? Alcohol abuse 08/28/2013     Plan:  IVFl discontinued  TPN ??discontinued  Pt was?? 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  Continue Scopolamine patch    DVT prophylaxis: Lovenox  Code Status: FULL      Dipso- patient awaiting placement??; unable to care for self needs lots of assistance??    Francoise SchaumannMarissa S Talasia Saulter, MD

## 2015-03-10 ENCOUNTER — Other Ambulatory Visit (INDEPENDENT_AMBULATORY_CARE_PROVIDER_SITE_OTHER): Payer: Self-pay | Admitting: Internal Medicine

## 2015-03-10 DIAGNOSIS — I10 Essential (primary) hypertension: Secondary | ICD-10-CM

## 2015-03-10 LAB — METABOLIC PANEL, BASIC
Anion gap: 5 mmol/L — ABNORMAL LOW (ref 7–16)
BUN: 20 MG/DL (ref 6–23)
CO2: 25 mmol/L (ref 21–32)
Calcium: 8.2 MG/DL — ABNORMAL LOW (ref 8.3–10.4)
Chloride: 108 mmol/L — ABNORMAL HIGH (ref 98–107)
Creatinine: 0.56 MG/DL — ABNORMAL LOW (ref 0.8–1.5)
GFR est AA: >60 mL/min/{1.73_m2} (ref >60)
GFR est non-AA: >60 mL/min/{1.73_m2} (ref >60)
Glucose: 139 mg/dL — ABNORMAL HIGH (ref 65–100)
Potassium: 4.1 mmol/L (ref 3.5–5.1)
Sodium: 138 mmol/L (ref 136–145)

## 2015-03-10 LAB — PHOSPHORUS: Phosphorus: 3 MG/DL (ref 2.5–4.5)

## 2015-03-10 LAB — MAGNESIUM: Magnesium: 1.5 mg/dL — ABNORMAL LOW (ref 1.8–2.4)

## 2015-03-10 MED FILL — SOLU-MEDROL (PF) 40 MG/ML SOLUTION FOR INJECTION: 40 mg/mL | INTRAMUSCULAR | Qty: 1

## 2015-03-10 MED FILL — LEVETIRACETAM 500 MG/5 ML IV SOLN: 500 mg/5 mL | INTRAVENOUS | Qty: 5

## 2015-03-10 MED FILL — MONOJECT PREFILL ADVANCED (PF) 100 UNIT/ML INTRAVENOUS SYRINGE: 100 unit/mL | INTRAVENOUS | Qty: 6

## 2015-03-10 MED FILL — CLONIDINE 0.2 MG/24 HR WEEKLY TRANSDERM PATCH: 0.2 mg/24 hr | TRANSDERMAL | Qty: 1

## 2015-03-10 MED FILL — CHLORDIAZEPOXIDE 10 MG CAP: 10 mg | ORAL | Qty: 1

## 2015-03-10 MED FILL — LORAZEPAM 2 MG/ML IJ SOLN: 2 mg/mL | INTRAMUSCULAR | Qty: 1

## 2015-03-10 MED FILL — NICOTINE 21 MG/24 HR DAILY PATCH: 21 mg/24 hr | TRANSDERMAL | Qty: 1

## 2015-03-10 MED FILL — LOVENOX 40 MG/0.4 ML SUBCUTANEOUS SYRINGE: 40 mg/0.4 mL | SUBCUTANEOUS | Qty: 0.4

## 2015-03-10 NOTE — Progress Notes (Signed)
Speech therapy note;  Attempted to see pt this am, however, pt out for his room.     Craig StaggersErica Moss MA/CCC/SLP

## 2015-03-10 NOTE — Progress Notes (Signed)
Hospitalist Progress Note    03/10/2015  Admit Date: 02/24/2015 10:47 PM   NAME: Brett Becker   DOB:  [birth year]   MRN:  [id number]   Attending: Caffie PintoAlexander M Drew, DO  PCP:  Phys Other, MD    SUBJECTIVE:     Off Service Note:  This  is a 54 y.o. white?? male who presented with altered mental status on 02/25/2015.?? He has  Had a prior admission for DT's in 2014.?? He was brought to the ED by a friend due to diminished responsiveness and seizure at home.?? No family or friends present at this time.?? Pt able to give limited history due to lethargy.?? He denies ever having had a seizure in his life, though he has had documented seizures previously.?? The Pt stateed that he normally drinks 6-8 beers per day and hadn't been drinking as much on the day of presentation.?? He denied chest pain, dyspnea, nausea, vomiting, abdominal pain.?? He reported normally having a tremor, but it is worse at the time of presentation.?? He also reported that he saw a red ball bouncing around his house today, but knew that it wasn't actually there. He was initially admitted to the ICU for DT's, alchol abuse and hepatitis C. He was at some point started on Keppra for seizure activity. He was assessed by speech & swallow and deemed to be an extreme aspiration risk. He was kept NPO and started on TPN.  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 MRI showed nothing acute. He continued to aspirate with feeds. As his mental status improved he became very upset by not being able to eat. He was re-assessed for his swallowing ability. He failed. He was re-assessed a few days later and though not a full pass he had improved. We decided to try him on a trial of po intake and he did well and has continued to improve with his po intake. He is unable to care for himself at home. He has had periods of agitation & confusion. He is sometimes inappropriate in terms of  Where he  is and what he can do. He can be non-compliant with therapy & verbally abusive but he has never been physically abusive with staff.  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 He is awaiting placement.    3/16 - Patient in bed with restraints, looks agitated, stating he wants to a knife to but the restraints. Does not threaten staff though, I have stressed that he needs to relax and rest. Mild tremors this am, CIWA protocol.??     3/17 - Resting comfortably this am, decrease in BL Wheezing on exam, Sats improved    3/18 - Patient more confused as prior days I don't appreciate any focal deficits at this time, will obtain a CT brain w/o contrast, and ammonia levels. Continue CIWA protocol, LFTs trending down.     3/19 - Patient knocking off his NC, will increased Systemic steroids as he did not tolerate titration. Maintain o2 sat>90%. Sedated this am, and restraints orders renewed.     3/20 - Patient more alert today than previous 2 days, again has taken his NC off.     3/21- TPN, MDS today via SW, less agitated this am, continue CIWA protocol. Failed MDS, switched meds to IV or topical many were already  ????  03/22 - seen. Still tremulous; not tolerating po as per Speech & swallow;  03/23- patient failed speech and swallow eval again today. He is more coherent and is very upset with staff because he cannot eat. 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 Speech advised NGT until Friday and then re-evaluate; pt will need placement    03/24- patient aggravated overnight- pulled out NGT. It was replaced. He is much more coherent and is very upset with not being able to eat; he has a lot of secretions; plan to repeat swallow eval; He wants to have a drink.    03/07/2015 - agitated overnight; pulled out ngt several times; pt had swallow eval -did better than last time; will dc NGT and give a trail of  feeds;    03/08/2015- seen. Less aggravated overnight. Tolerating po. Less agitated    03/09/2015- pt is doing much better in terms of feeding and agitation; still confused at?? Times off & on; awaiting placement    03/10/2015- seen no adverse overnight events. He is awaiting placement      Review of Systems negative with exception of pertinent positives noted above  PHYSICAL EXAM   BP 130/82 mmHg   Pulse 60   Temp(Src) 98 ??F (36.7 ??C)   Resp 19   Ht  (1.727 m)   Wt 51.937 kg (114 lb 8 oz)   BMI 17.41 kg/m2   SpO2 91%   Temp (24hrs), Avg:97.9 ??F (36.6 ??C), Min:97.3 ??F (36.3 ??C), Max:98.2 ??F (36.8 ??C)    Oxygen Therapy  O2 Sat (%): 91 % (03/10/15 1128)  Pulse via Oximetry: 61 beats per minute (03/10/15 0303)  O2 Device: Room air (03/10/15 1128)  O2 Flow Rate (L/min): 2 l/min (03/07/15 0136)    Intake/Output Summary (Last 24 hours) at 03/10/15 1352  Last data filed at 03/10/15 1239   Gross per 24 hour   Intake    240 ml   Output      0 ml   Net    240 ml      General: No acute distress????  Lungs:  CTA Bilaterally.   Heart:  Regular rate and rhythm,?? No murmur, rub, or gallop  Abdomen: Soft, Non distended, Non tender, Positive bowel sounds  Extremities: No cyanosis, clubbing or edema  Neurologic:?? No focal deficits    ASSESSMENT      Active Hospital Problems    Diagnosis Date Noted   ??? 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 Hyponatremia 02/25/2015   ??? Hepatitis C 08/31/2013   ??? HTN (hypertension) 08/30/2013   ??? Alcohol abuse 08/28/2013     Plan:  IVFl discontinued  TPN ??discontinued  Pt was?? 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  Continue Scopolamine patch    DVT prophylaxis: Lovenox  Code Status: FULL       Dipso- patient awaiting placement??; unable to care for self needs lots of assistance??      Francoise Schaumann, MD

## 2015-03-10 NOTE — Other (Signed)
Utilization Review          CLINICAL REVIEW 03/09/15 by Marcene DuosJennifer L Schultz, RN      Review Status Review Entered      03/09/2015     Details      SUBJECTIVE:    Patient is a 54 y.o. white male who presents with altered mental status. Pt has prior admission by me for DT's in 2014. He was brought to the ED by a friend due to diminished responsiveness and seizure at home. No family or friends present at this time. Pt able to give limited history due to lethargy. He denies ever having had a seizure in his life, though he has had documented seizures previously. Pt states he normally drinks 6-8 beers per day and hadn't been drinking as much today. He denies chest pain, dyspnea, nausea, vomiting, abdominal pain. He reports normally having a tremor, but it is worse at the present moment. He also reports to me that he saw a red ball bouncing around his house today, but knew that it wasn't actually there.    3/16 - Patient in bed with restraints, looks agitated, stating he wants to a knife to but the restraints. Does not threaten staff though, I have stressed that he needs to relax and rest. Mild tremors this am, CIWA protocol.     3/17 - Resting comfortably this am, decrease in BL Wheezing on exam, Sats improved    3/18 - Patient more confused as prior days I don't appreciate any focal deficits at this time, will obtain a CT brain w/o contrast, and ammonia levels. Continue CIWA protocol, LFTs trending down.     3/19 - Patient knocking off his NC, will increased Systemic steroids as he did not tolerate titration. Maintain o2 sat>90%. Sedated this am, and restraints orders renewed.     3/20 - Patient more alert today than previous 2 days, again has taken his NC off.     3/21- TPN, MDS today via SW, less agitated this am, continue CIWA protocol. Failed MDS, switched meds to IV or topical many were already  03/22 - seen. Still tremulous; not tolerating po as per Speech & swallow;      03/23- patient failed speech and swallow eval again today. He is more coherent and is very upset with staff because he cannot eat. 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 Speech advised NGT until Friday and then re-evaluate; pt will need placement    03/24- patient aggravated overnight- pulled out NGT. It was replaced. He is much more coherent and is very upset with not being able to eat; he has a lot of secretions; plan to repeat swallow eval; He wants to have a drink.    03/07/2015 - agitated overnight; pulled out ngt several times; pt had swallow eval -did better than last time; will dc NGT and give a trail of feeds;    03/08/2015- seen. Less aggravated overnight. Tolerating po. Less agitated    03/09/2015- pt is doing much better in terms of feeding and agitation; still confused at Times off & on; awaiting placement    Review of Systems negative with exception of pertinent positives noted above  PHYSICAL EXAM    BP 120/66 mmHg   Pulse 76   Temp(Src) 98.1 ??F (36.7 ??C)   Resp 18   Ht 5\' 8"  (1.727 m)  Wt 51.937 kg (114 lb 8 oz)   BMI 17.41 kg/m2   SpO2 96%   Temp (24hrs), Avg:97.7 ??F (36.5 ??C), Min:96.6 ??F (35.9 ??C), Max:98.7 ??F (37.1 ??C)    Oxygen Therapy  O2 Sat (%): 96 % (03/09/15 1528)  Pulse via Oximetry: 79 beats per minute (03/07/15 0136)  O2 Device: Nasal cannula (03/07/15 0136)  O2 Flow Rate (L/min): 2 l/min (03/07/15 0136)    Intake/Output Summary (Last 24 hours) at 03/09/15 1606  Last data filed at 03/09/15 0845   Gross per 24 hour    Intake  115 ml    Output  0 ml    Net  115 ml    General: No acute distress   Lungs: CTA Bilaterally.   Heart: Regular rate and rhythm, No murmur, rub, or gallop  Abdomen: Soft, Non distended, Non tender, Positive bowel sounds  Extremities: No cyanosis, clubbing or edema  Neurologic: No focal deficits  ASSESSMENT     Active Hospital Problems     Diagnosis  Date Noted     ???  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  Hyponatremia  02/25/2015    ???  Hepatitis C  08/31/2013    ???  HTN (hypertension)  08/30/2013    ???  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; However he is doing progressively better  MRI brain no acute infarct    Continue IV Ativan prn   Continue Scopolamine patch    DVT prophylaxis: Lovenox  Code Status: FULL      Dipso- patient awaiting placement ; unable to care for self needs lots of assistance     Francoise Schaumann, MD       levETIRAcetam (KEPPRA) 500 mg in 0.9% sodium chloride IVPB  Dose: 500 mg Freq: EVERY 12 HOURS Route: IV  LORazepam (ATIVAN) injection 0.5 mg  Dose: 0.5 mg Freq: EVERY 8 HOURS Route: IV  methylPREDNISolone (PF) (SOLU-MEDROL) injection 10 mg  Dose: 10 mg Freq: EVERY 8 HOURS Route: IV  potassium chloride 20 mEq in 100 ml IVPB  Dose: 20 mEq Freq: ONCE Route: IV              Clinical review for 03/08/15 by Earlie Raveling, RN      Review Status Review Entered      03/08/2015     Details      Care Date 03/08/15  VS 98.4 67 18 146/74 97%  Hospitalist Progress Note    03/08/2015  03/08/2015- seen. Less aggravated overnight. Tolerating po. Less agitated  General: No acute distress   Lungs: CTA Bilaterally.   Heart: Regular rate and rhythm, No murmur, rub, or gallop  Abdomen: Soft, Non distended, Non tender, Positive bowel sounds  Extremities: No cyanosis, clubbing or edema  Neurologic: No focal deficits  ASSESSMENT     Active Hospital Problems     Diagnosis  Date Noted    ???  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  Hyponatremia  02/25/2015    ???  Hepatitis C  08/31/2013    ???  HTN (hypertension)  08/30/2013    ???  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  MRI brain no acute infarct    Continue IV Ativan prn   Continue Scopolamine patch    DVT prophylaxis: Lovenox  Code Status: FULL    Dipso- patient will need placement ; unable to care for self needs lots of assistance                     Signed by Francoise Schaumann, MD on 03/08/15 1812  No Imaging or Cardiology studies today  Labs: Glucose 127 BUN 26 Creat 0.46   Plan:  Inpatient status  Librium 10 mg po TID  Lovenox 40 mg SC q 24 hrs  Keppra 500 mg IV q 12 hrs  Ativan 0.5 mg IV q 8 hrs  Solu Medrol 10 mg IV q 8 hrs              03/07/15 continue stay review by Tawny Asal, RN      Review Status Review Entered      03/07/2015     Details      agitated overnight; pulled out ngt several times; pt had swallow eval -did better than last time; will dc NGT and give a trail of feeds;    Review of Systems negative with exception of pertinent positives noted above  PHYSICAL EXAM    BP 119/79 mmHg   Pulse 73   Temp(Src) 97.6 ??F (36.4 ??C)   Resp 19   Ht 5\' 8"  (1.727 m)   Wt 51.937 kg (114 lb 8 oz)   BMI 17.41 kg/m2   SpO2 91%   Oxygen Therapy  O2 Sat (%): 91 % (03/07/15 0348)  Pulse via Oximetry: 79 beats per minute (03/07/15 0136)  O2 Device: Nasal cannula (03/07/15 0136)  O2 Flow Rate (L/min): 2 l/min (03/07/15 0136)  General: No acute distress ; thin emaciated; frail  Lungs: CTA Bilaterally.   Heart: Regular rate and rhythm, No murmur, rub, or gallop  Abdomen: Soft, Non distended, Non tender, Positive bowel sounds  Extremities: No cyanosis, clubbing or edema  Neurologic: No focal deficits  ASSESSMENT     Active Hospital Problems     Diagnosis  Date Noted    ???  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  Hyponatremia  02/25/2015    ???  Hepatitis C  08/31/2013    ???  HTN (hypertension)  08/30/2013    ???  Alcohol abuse  08/28/2013       Plan:  ASSESSMENT      Active Hospital Problems     Diagnosis  Date Noted    ???  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  Hyponatremia  02/25/2015    ???  Hepatitis C  08/31/2013    ???  HTN (hypertension)  08/30/2013    ???  Alcohol abuse  08/28/2013      Plan:  Continue IVFL;   TPN discontinued  Pt was NPO - failed speech and swallow eval previously did better today; will give a trial of feeds  MRI brain no acute infarct  Further management as w/up dictates  Continue IV Ativan   Heavy secretions Scopolamine patch    DVT prophylaxis: Lovenox  Code Status: FULL  Dipso- patient will need placement  VS Treatment 97.4 P 78 B/P 131/63 R 18 sat 94% 2 liters NC   Labs WBC 9.0 Hgb 14.9 Neutrophil 87 Na 139 K 3.7 Cl 106 CO2 28 Anion gap 5 Glucose 156 BUN 28 Creat 0.65 Ca 8.2   Barium Swallow test IMPRESSION: Deep laryngeal penetration with thin liquids and deep laryngeal penetration of residuals during ingestion of honey consistency.  Treatment  Duoneb nebulizer q 8 Librium 10 mg po tid Lovenox 40 mg SQ qd Haldol 5 mg IV q 6 prn Apresoline 20 mg IV q 6 prn Ativan 0.5 mg IV q 8 Ativan 1-2 mg IV q 2 prn x 1 dose so far today Keppra 500 mg IV q 12 Solumedrol 10 mg IV q 8 d/c telemetry VS qsh I/O qsh PT/OT consult Feeding tube Jevity 1.2 at 40 cc/hr with 25 cc/hr water flush advance TF rate by 10 cc q 4 as tolerated to goal at 60 cc/hr restrain prn               03/06/15 continue stay review by Tawny Asal, RN      Review Status Review Entered      03/06/2015     Details      patient aggravated overnight- pulled out NGT. It was replaced. He is much more coherent and is very upset with not being able to eat; he has a lot of secretions; plan to repeat swallow eval; He wants to have a drink.  General: No acute distress   Lungs: CTA Bilaterally.   Heart: Regular rate and rhythm, No murmur, rub, or gallop  Abdomen: Soft, Non distended, Non tender, Positive bowel sounds  Extremities: No cyanosis, clubbing or edema   Neurologic: No focal deficits  ASSESSMENT     Active Hospital Problems     Diagnosis  Date Noted    ???  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  Hyponatremia  02/25/2015    ???  Hepatitis C  08/31/2013    ???  HTN (hypertension)  08/30/2013    ???  Alcohol abuse  08/28/2013      Plan:  Continue IVFL;   TPN discontinued  Pt is NPO - failed speech and swallow eval again today; ngt placed temporarily for feeds- pt feels this is not sufficient; revaluate mane  MRI brain no acute infarct  Further management as w/up dictates  Continue IV Ativan   Heavy secretions Scopolamine patch      VS T 97.7 P 72 B/P 145/90 R 16 sat 90% room air  Labs Na 140 K 4 Cl 108 CO2 28 Anion gap 4 Glucose 141 BUN 26 Creat 0.53   Treatment Albuterol nebulizer q 4 prn Duoneb nebulizer q 6 prn Lovenox 40 mg SQ qd LHaldol 2 mg IM q 6 prn Apresoline 20 mg IV q 6 prn Ativan 0.5 mg IV q 8 Ativan 1-2 mg IV q 2 prn x 1 dose so far today Keppra 500 mg IV q 12 Solumedrol 10 mg IV q 8 remote telemetry VS qsh I/O qsh PT/OT consult Feeding tube Jevity 1.2 at 40 cc/hr with 25 cc/hr water flush advance TF rate by 10 cc q 4 as tolerated to goal at 60 cc/hr

## 2015-03-10 NOTE — Progress Notes (Signed)
Problem: Nutrition Deficit  Goal: *Optimize nutritional status  Nutrition F/U  RD notes: Pt's TF discontinued on 3/25 with MBS recommendations for pureed, honey thick liquids diet. Nursing and ST notes indicate pt tend to eat ~75-100% of meal trays with assistance. Today, pt is sleeping soundly during RD visit and does not awake to name called.    Macronutrient needs:      EER: 1557-1816 kcal /day (30-35 kcal/kg Actual BW)      EPR: 62-72 grams protein/day (1.2-1.4 grams/kg IBW)      Intake/Comparative Standards: Average intake over past 4 days/2 recorded meals: 75%. This meets ~100% of estimated caloric and ~97% of estimated protein needs.    Updated Nutrition Dx: No nutrition dx.    Intervention:      1. Meals and snacks: Pureed, Honey Thick Liquids    Brett LeyErin Willey, MS,RD/LD [phone]

## 2015-03-10 NOTE — Progress Notes (Signed)
LMSW spoke with Timmothy SoursPatricia Helms RN [phone] with [facility] today about pt level of care referral.  She received request today and will be processing his level of care over the next few days.  Hopefully it will be available by Wed/Thurs this week.  Insurance precert is still pending per [employer] liaison with [employer] for [employer].  Will follow up.

## 2015-03-10 NOTE — Progress Notes (Signed)
Problem: Mobility Impaired (Adult and Pediatric)  Goal: *Acute Goals and Plan of Care (Insert Text)  Discharge Goals:  (1.)Mr. Stickels will move from supine to sit and sit to supine, scoot up and down and roll side to side with INDEPENDENT within 7 day(s).   (2.)Mr. Toya will transfer from bed to chair and chair to bed with MODIFIED INDEPENDENCE using the least restrictive device within 7 day(s).   (3.)Mr. Bognar will ambulate with MODIFIED INDEPENDENCE for 150+ feet with the least restrictive device within 7 day(s).   (4.)Mr. Buonocore will tolerate 25+ minutes of therapeutic activity/exercise while maintaining stable vitals to improve functional strength and endurance within 7 day(s).  (5.)Mr. Fosdick will demonstrate good dynamic standing balance to safely perform functional mobility and ambulation within 7 day(s).  ________________________________________________________________________________________________  PHYSICAL THERAPY: Daily Note, Treatment Day: 6th and AM  INPATIENT: Medicaid : Hospital Day: 15    NAME/AGE/GENDER: Gerrard Crystal is a 54 y.o. male  DATE: 03/10/2015  PRIMARY DIAGNOSIS: Delirium tremens ([place])  Delirium tremens ([place])  Delirium tremens ([place])        ICD-10: Treatment Diagnosis: Unsteadiness on feet; Unspecified lack of coordination   INTERDISCIPLINARY COLLABORATION: Physical Therapy Assistant, Certified Occupational Therapy Assistant and Registered Nurse  ASSESSMENT:   Mr. Gaumer presents supine in bed, oriented x3, and agreeable to PT treatment session. Patient performed bed mobility with minimal assistance, transfers with min-moderate assistance x2, and ambulated 22f within [facility] to [facility] with RW and moderate assistance x2. Patient demonstrated a strong posterior lean with flexed trunk and unable to correct despite verbal and tactile cueing. Required maximal assistance for safety throughout and moderate assistance for gait mechanics and safe RW  negotiation. Transitioned into bedside chair and patient brought to group therapy where he participated in B LE therapeutic exercises to improve functional strength, activity tolerance and mobility. Returned to room and left sitting up in chair with posey alarm activated and RN notified of patient status/progress. Good progress today with improved participation and overall requiring a less assistance. Will continue to follow during acute stay and progress toward stated goals to optimize functional independence and safety.     ????????This section established at most recent assessment??????????  PROBLEM LIST (Impairments causing functional limitations):  1. Decreased Strength effecting function  2. Decreased ADL/Functional Activities  3. Decreased Transfer Abilities  4. Decreased Ambulation Ability/Technique  5. Decreased Balance  6. Decreased Activity Tolerance  7. Increased Fatigue effecting function  8. Increased Shortness of Breath affecting function  9. Decreased Knowledge of precautions  10. Decreased Independence with Home Exercise Program  REHABILITATION POTENTIAL FOR STATED GOALS: GOOD      PLAN OF CARE:   INTERVENTIONS PLANNED: (Benefits and precautions of physical therapy have been discussed with the patient.)  1. balance exercise  2. bed mobility  3. family education  4. gait training  5. neuromuscular re-education/strengthening  6. range of motion: active/assisted/passive  7. therapeutic activities  8. therapeutic exercise/strengthening  9. transfer training  FREQUENCY/DURATION: Follow patient 1-2 times per day/4-7 days per week until goals are met in order to address above goals.    RECOMMENDED REHABILITATION/EQUIPMENT: (at time of discharge pending progress):   Rehab.  SUBJECTIVE:   "I know the drill."    Present Symptoms: endorses 0/10 pain.   Pain Intensity 1: 0     History of Present Injury/Illness: Per chart:  Patient is a 54y.o. white  male who presents with altered mental status.   Pt has prior admission  by me for DT's in 2014.  He was brought to the ED by a friend due to diminished responsiveness and seizure at home.  No family or friends present at this time.  Pt able to give limited history due to lethargy.  He denies ever having had a seizure in his life, though he has had documented seizures previously.  Pt states he normally drinks 6-8 beers per day and hadn't been drinking as much today.  He denies chest pain, dyspnea, nausea, vomiting, abdominal pain.  He reports normally having a tremor, but it is worse at the present moment.  He also reports to me that he saw a red ball bouncing around his house today, but knew that it wasn't actually there.      Past Medical History    Diagnosis  Date    ???  Delirium tremens ([place])  September, 2014        Admitted with DTs and seizure activity.  Found to have GI bleeding secondary to gastric ullcers.    ???  Gastric ulcer  September, 2014        Found at EGD to have major gastric ulcers.          Past Surgical History    Procedure  Laterality  Date    ???  Pr neurological procedure unlisted        ???  Hx back surgery            x 2    ???  Hx endoscopy    September, 2014              Prior Level of Function/Home Situation:   History provided by patient/chart; patient is a poor historian at time of initial assessment.  Patient live alone in a private residence/apartment.  At baseline, independent with ADLs and ambulates with a straight cane.  Home Environment: Private residence  One/Two Story Residence: One story  Living Alone: Yes  Support Systems: Friends \\ neighbors  Patient Expects to be Discharged to:: Apartment  Current DME Used/Available at Home: Cane, straight, Environmental consultant, rolling  OBJECTIVE/TREATMENT:   (In addition to Assessment/Re-Assessment sessions the following treatments were rendered)                                      [facility]??? ???6 Clicks???                                          Basic Mobility Inpatient Short Form   How much difficulty does the patient currently have... Unable A Lot A Little None   1.  Turning over in bed (including adjusting bedclothes, sheets and blankets)?   '[ ]'  1   '[ ]'  2   '[X]'  3   '[ ]'  4   2.  Sitting down on and standing up from a chair with arms ( e.g., wheelchair, bedside commode, etc.)   '[ ]'  1   '[X]'  2   '[ ]'  3   '[ ]'  4   3.  Moving from lying on back to sitting on the side of the bed?   '[ ]'  1   '[ ]'  2   '[X]'  3   '[ ]'  4  How much help from another person does the patient currently need... Total A Lot A Little None   4.  Moving to and from a bed to a chair (including a wheelchair)?   '[ ]'  1   '[X]'  2   '[ ]'  3   '[ ]'  4   5.  Need to walk in hospital room?   '[ ]'  1   '[X]'  2   '[ ]'  3   '[ ]'  4   6.  Climbing 3-5 steps with a railing?   '[ ]'  1   '[X]'  2   '[ ]'  3   '[ ]'  4   ?? 2007, Trustees of [employer], under license to [employer]. All rights reserved       Score:  Initial: 14 Most Recent: 14 (Date: 02/25/14 )   Interpretation of Tool:  Represents activities that are increasingly more difficult (i.e. Bed mobility, Transfers, Gait).  Score 24 23 22-20 19-15 14-10 9-7 6   Modifier CH CI CJ CK CL CM CN       ?? Mobility - Walking and Moving Around:              X1062 - CURRENT STATUS:       CL - 60%-79% impaired, limited or restricted              I9485 - GOAL STATUS:               CJ - 20%-39% impaired, limited or restricted              I6270 - D/C STATUS:                    ---------------To be determined---------------  Payor: ADVICARE MEDICAID / Plan: [place] ADVICARE MEDICAID / Product Type: Managed Care Medicaid /    Most Recent Physical Functioning:   Gross Assessment:  AROM: Within functional limits (B LE)  Strength: Generally decreased, functional (4-/5 B LE functionally)  Coordination: Generally decreased, functional               Posture:  Posture (WDL): Exceptions to WDL  Posture Assessment: Forward head, Rounded shoulders, Kyphosis  Balance:  Sitting: Impaired  Sitting - Static: Fair (occasional)   Sitting - Dynamic: Fair (occasional)  Standing: Impaired  Standing - Static: Poor  Standing - Dynamic : Poor  Bed Mobility:  Supine to Sit: Minimum assistance  Scooting: Contact guard assistance  Wheelchair Mobility:     Transfers:  Sit to Stand: Minimum assistance;Assist x2  Bed to Chair: Moderate assistance;Assist x2  Interventions: Safety awareness training;Verbal cues;Tactile cues;Visual cues;Manual cues  Gait:     Base of Support: Narrowed;Center of gravity altered  Speed/Cadence: Shuffled;Slow  Step Length: Right shortened;Left shortened  Gait Abnormalities: Decreased step clearance;Path deviations;Shuffling gait;Trunk sway increased;Other (Flexed; posterior lean)  Distance (ft): 8 Feet (ft)  Assistive Device: Walker, rolling  Ambulation - Level of Assistance: Moderate assistance;Assist x2  Interventions: Safety awareness training;Tactile cues;Verbal cues;Manual cues     Therapeutic Activity: (    30 minutes):  Therapeutic activities including bed mobility, transfer training, balance training, safety awareness training, ambulation on level ground x40f, and patient education on safety to improve mobility, strength, balance and coordination.  Required moderate Safety awareness training;Tactile cues;Verbal cues;Manual cues to promote coordination of bilateral, lower extremity(s) and promote motor control of bilateral, lower extremity(s).     Therapeutic Exercise: (15 Minutes):  Exercises per grid below to improve mobility, strength, balance  and coordination.  Required minimal visual and verbal cues to promote proper body alignment, promote proper body posture and promote proper body mechanics.     Date:  03/10/15 Date:   Date:     Activity/Exercise Parameters Parameters Parameters   Seated hip flexion x25B     Seated hip ab/adductuon x20B     LAQ x20B     Ankle plantarflexion x25B     Ankle dorsiflexion x25B       Braces/Orthotics/Lines/Etc:   ?? IV  Safety:   After treatment position/precautions:   ?? Up in chair, Bed alarm/tab alert on, Bed/Chair-wheels locked, Bed in low position, Call light within reach and RN notified  Progression/Medical Necessity:   ?? Patient demonstrates good rehab potential due to higher previous functional level.  Compliance with Program/Exercises: Will assess as treatment progresses.   Reason for Continuation of Services/Other Comments:  ?? Patient continues to require skilled intervention due to decreased functional mobility, coordination, and balance/gait status from baseline..  Recommendations/Intent for next treatment session: Treatment next visit will focus on advancements to more challenging activities and reduction in assistance provided.    Total Treatment Duration:  Time In: 1010 (1045)  Time Out: 1040 (1100)  [place] Guardian, PT

## 2015-03-10 NOTE — Progress Notes (Signed)
Problem: Dysphagia (Adult)  Goal: *Acute Goals and Plan of Care (Insert Text)  STG: Pt will tolerate mechanical soft/honey thick liquids without signs/sx of aspiration with 100% accuracy  STG: Pt will participate with modified barium swallow study x1 (goal met 03/07/15)  STG: Pt will participate with laryngeal exercises x10 with 90% accuracy  LTG: Pt will tolerate the least restrictive diet at discharge without respiratory compromise    SPEECH LANGUAGE PATHOLOGY: BEDSIDE SWALLOW STUDY: Daily Note 1    NAME/AGE/GENDER: Brett Becker is a 54 y.o. male  DATE: 03/10/2015  PRIMARY DIAGNOSIS: Delirium tremens ([place])       ICD-10: Treatment Diagnosis: dysphagia; oropharyngeal R13.12  INTERDISCIPLINARY COLLABORATION: raddiologist  PRECAUTIONS/ALLERGIES: Codeine   ASSESSMENT/PLAN OF CARE:   Patient seen for laryngeal exercises. Patient alert and upright in chair; he participated with 1x10 exercises noted below. He demonstrates fatigue with effort and required rest breaks to catch his breath and/or cough with mucus production. His vocal quality is wet today, which decreases his intelligibility to approximately 70%.     Patient's last modified barium swallow study (MBS) was performed on 03/07/15, with notes indicating, "Recommend pureed/honey thick liquids by spoon or small cup sip as safest po diet at this time.?? Discussed with MD that patient is approaching baseline level of function, is not an ideal PEG candidate, and does not wish for artificial nutrition.?? Strict aspiration precautions recommended.?? MD in agreement."??     Recommend continue to follow for dysphagia treatment/laryngeal exercises.    Patient will benefit from skilled intervention to address the below impairments.  ????????This section established at most recent assessment??????????  RECOMMENDATIONS AND PLANNED INTERVENTIONS (Benefits and precautions of therapy have been discussed with the patient.):  ?? PO:  Pureed and Liquids:  honey  MEDICATIONS:   ?? with thickened liquid  COMPENSATORY STRATEGIES/MODIFICATIONS INCLUDING:  ?? Effortful swallow  ?? Small sips and bites    OTHER RECOMMENDATIONS (including follow up treatment recommendations):   ?? Tongue based expressions  ?? Family training/education  ?? Laryngeal exercises  ?? Patient education  FREQUENCY/DURATION: Continue to follow patient 3-5x a week to address above goals.   RECOMMENDED REHABILITATION/EQUIPMENT: (at time of discharge pending progress):   Rehab.      SUBJECTIVE:   Patient was cooperative.   History of Present Injury/Illness: Brett Becker  has a past medical history of Delirium tremens [facility]) (September, 2014) and Gastric ulcer (September, 2014).  He also  has past surgical history that includes neurological procedure unlisted; back surgery; and endoscopy (September, 2014).  Present Symptoms: cough with po  Pain Intensity 1: 0  Pain Location 1: Hand  Pain Orientation 1: Left, Right  Pain Intervention(s) 1: MD notified (comment) (paging MD)    Current Dietary Status:  Puree/Honey           ?? History of reflux:  no  Social History/Home Situation: home alone  Home Environment: Private residence  One/Two Story Residence: One story  Living Alone: Yes  Support Systems: Friends \\ neighbors  Patient Expects to be Discharged to:: Apartment  Current DME Used/Available at Home: Cane, straight, Environmental consultant, rolling      OBJECTIVE:       Cognitive/Communication Status:  Mental Status  Neurologic State: Alert  Orientation Level: Oriented to place, Oriented to person, Disoriented to situation, Disoriented to time  Cognition: Poor safety awareness, Impulsive  Perception: Appears intact  Perseveration: No perseveration noted  Safety/Judgement: Decreased insight into deficits    Oral Assessment:  Oral Assessment  Labial: Impaired  coordination  Dentition: Limited  Lingual: Decreased rate  Mandible: No impairment  Gag Reflex: No impairment    LARYNGEAL / PHARYNGEAL EXERCISES:           Effortful Swallow: Yes  Reps : 5   Sets : 2  Hard Glottal Attack: Yes  Reps : 10  Sets : 2                                                          Sing "EEE": Yes  Reps : 10  Sets : 1                    Sustained "ah": Yes  Sets : 1  Reps : 5;10                Dysphagia Activities: Activities/Procedures listed utilized to improve progress in swallow function. Required maximal cueing to complete laryngeal exercises.    Tool Used: Dysphagia Outcome and Severity Scale (DOSS)     Score Comments   Normal Diet  '[ ]'  7 With no strategies or extra time needed        Functional Swallow  '[ ]'  6 May have mild oral or pharyngeal delay        Mild Dysphagia     '[ ]'  5 Which may require one diet consistency restricted (those who demonstrate penetration which is entirely cleared on MBS would be included)   Mild-Moderate Dysphagia  '[ ]'  4 With 1-2 diet consistencies restricted        Moderate Dysphagia  '[ ]'  3 With 2 or more diet consistencies restricted        Moderately Severe Dysphagia  '[ ]'  2 With partial PO strategies (trials with ST only)        Severe Dysphagia  '[X]'  1 With inability to tolerate any PO safely           Score:  Initial: 1 Most Recent: 2 (Date: 03/07/15 per MBS )   Interpretation of Tool: The Dysphagia Outcome and Severity Scale (DOSS) is a simple, easy-to-use, 7-point scale developed to systematically rate the functional severity of dysphagia based on objective assessment and make recommendations for diet level, independence level, and type of nutrition.       Score '7 6 5 4 3 2 1   ' Modifier CH CI CJ CK CL CM CN   ?? Swallowing:              [id number] - CURRENT STATUS:           CN - 100% impaired, limited or restricted              G2542 - GOAL STATUS:                   CL - 60%-79% impaired, limited or restricted              H0623 - D/C STATUS:                       ---------------To be determined---------------  Payor: ADVICARE MEDICAID / Plan: [place] ADVICARE MEDICA[place] / Product Type: Managed Care Medicaid /    __________________________________________________________________________________________________  Safety:   After treatment position/precautions:  ?? Upright in Bed  Recommendations for treatment: laryngeal  exercises  Total Treatment Duration:  Time In: 1116   Time Out: [place], MCD, CCC-SLP

## 2015-03-10 NOTE — Progress Notes (Signed)
Problem: Self Care Deficits Care Plan (Adult)  Goal: *Acute Goals and Plan of Care (Insert Text)  1. Patient will feed self entire meal with set-up.   2. Patient will complete grooming with set-up.   3. Patient will tolerate 15 minutes of OT treatment with no rest breaks to increase activity tolerance for ADLs.   4. Patient will complete functional transfers with minimal assistance and adaptive equipment as needed.   5. Patient will demonstrate fair standing balance to increase safety during standing ADLs.     Timeframe: 7 visits   OCCUPATIONAL THERAPY: Daily Note, Treatment Day: 7th and AM  INPATIENT: Medicaid : Hospital Day: 15    NAME/AGE/GENDER: Brett Becker is a 54 y.o. 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 Pt presents supine upon arrival. Pt stated he needed to use the commode. Pt able to stand with min assist x2. Pt transfers over to bedside commode with mod assist x2 due to decrease balance and awareness of environment. Pt had BM. Dependent for hygiene at this time. Pt performed UE exercises (in grid below) in therapy gym to increase UE strength and activity tolerance. Pt tolerated exercises well requiring visual, verbal, and tactile cueing to complete. Pt returned to room in recliner. Pt more alert and with it today. Will continue to benefit from skilled OT during stay.   ????????This section established at most recent assessment??????????  PROBLEM LIST (Impairments causing functional limitations):  1. Decreased Strength effecting function  2. Decreased ADL/Functional Activities   3. Decreased Transfer Abilities  4. Decreased Ambulation Ability/Technique  5. Decreased Balance  6. Decreased Activity Tolerance  7. Impaired balance affecting function  8. Impaired coordination affecting function  REHABILITATION POTENTIAL FOR STATED GOALS: GUARDED  PLAN OF CARE:  INTERVENTIONS PLANNED: (Benefits and precautions of occupational therapy have been discussed with the patient.)  1. Activities of daily living training  2. Adaptive equipment training  3. Cognitive training  4. Donning&doffing training  5. Group therapy  6. Theraputic activity  7. Theraputic exercise  FREQUENCY/DURATION: Follow patient 1-2 times per day/3-5 days per week until goals are met in order to address above goals.    RECOMMENDED REHABILITATION/EQUIPMENT: (at time of discharge pending progress):   Continue OT.  SUBJECTIVE:   "I need to sit on the commode"    History of Present Injury/Illness: Per H&P, "Patient is a 54 y.o. white  male who presents with altered mental status.  Pt has prior admission by me for DT's in 2014.  He was brought to the ED by a friend due to diminished responsiveness and seizure at home.  No family or friends present at this time.  Pt able to give limited history due to lethargy.  He denies ever having had a seizure in his life, though he has had documented seizures previously.  Pt states he normally drinks 6-8 beers per day and hadn't been drinking as much today.  He denies chest pain, dyspnea, nausea, vomiting, abdominal pain.  He reports normally having a tremor, but it is worse at the present moment.  He also reports to me that he saw a  red ball bouncing around his house today, but knew that it wasn't actually there."  Present Symptoms: confusion    Pain Intensity 1: 0  Prior Level of Function/Home Situation: Patient is a poor historian but reports that he lives with his friend. He says he is independent with ADLs. He uses a bike for communication and does not drive.     Home Environment: Private residence  One/Two Story Residence: One story  Living Alone: Yes  Support Systems: Friends \\ neighbors  Patient Expects to be Discharged to:: Apartment  Current DME Used/Available at Home: Cane, straight, Environmental consultant, rolling  OBJECTIVE/TREATMENT:   Self Care: (25 minutes): Procedure(s) (per grid) utilized to improve and/or restore self-care/home management as related to toileting. Required maximal verbal, manual and tactile cueing to facilitate activities of daily living skills.    Therapeutic Exercise: (15 minutes):  Exercises per grid below to improve mobility, strength and activity tolerance.  Required minimal visual, verbal and tactile cues to promote proper body alignment and promote proper body mechanics.  Progressed resistance and repetitions as indicated.      Exercises completed with yellow theraband Date:  03/04/2015 Date:  03/10/2015  With red theraband Date:     Activity/Exercise Parameters Parameters Parameters   Horizontal Shoulder Abd/Adduction 15 reps 15 reps    Shoulder Flexion 10 reps 10 reps    Elbow Flexion 15 reps 15 reps    Punches 10 reps 15 reps                                                                        [place] AM-PACTM "6 Clicks"                                                       Daily Activity Inpatient Short Form  How much help from another person does the patient currently need... Total A Lot A Little None   1.  Putting on and taking off regular lower body clothing?   _0  1   _1  2   _2  3   _3  4   2.  Bathing (including washing, rinsing, drying)?   _4  1   _5  2   _6  3   _7  4   3.  Toileting, which includes using toilet, bedpan or urinal?   _8  1   _9  2   _10  3   _11  4   4.  Putting on and taking off regular upper body clothing?   _12  1   _13  2   _14  3   _15  4   5.  Taking care of personal grooming such as brushing teeth?   _16  1   _17  2   _18  3   _19  4   6.  Eating meals?   _20  1   _21  2   _22  3   _23  4    ?? 2007, [employer], under license to [place]. All rights reserved  Score:  Initial: 9 Most Recent: X (Date: -- )   Interpretation of Tool:  Represents clinically-significant functional categories (i.e.Activities of daily living).  Score 24 23 22-20 19-14 13-9 8-7 6   Modifier CH CI CJ CK CL CM CN       ?? Self Care:              [phone] - CURRENT STATUS:           CL - 60%-79% impaired, limited or restricted              [id number] - GOAL STATUS:                   CK - 40%-59% impaired, limited or restricted              X1506 - D/C STATUS:                       ---------------To be determined---------------  Payor: ADVICARE MEDICAID / Plan[place] / Product Type: Managed Care Medicaid /      Balance  Sitting: Impaired  Sitting - Static: Fair (occasional)  Sitting - Dynamic: Fair (occasional)  Standing: Impaired  Standing - Static: Poor  Standing - Dynamic : Poor       Patient Vitals for the past 6 hrs:   BP BP Patient Position SpO2 Pulse   03/10/15 1128 130/82 mmHg Sitting 91 % 60                           Most Recent Physical Functioning:   Gross Assessment:  AROM: Generally decreased, functional (BUE)  Strength: Generally decreased, functional (BUE)  Coordination: Generally decreased, functional (BUE)               Posture:     Balance:  Sitting: Impaired  Sitting - Static: Fair (occasional)  Sitting - Dynamic: Fair (occasional)  Standing: Impaired  Standing - Static: Poor  Standing - Dynamic : Poor  Bed Mobility:     Wheelchair Mobility:     Transfers:  Sit to Stand: Minimum assistance;Assist x2     Mental Status  Neurologic State: Alert  Orientation Level: Oriented to person;Oriented to place  Cognition: Follows commands  Perception: Appears intact  Perseveration: No perseveration noted  Safety/Judgement: Decreased awareness of environment;Fall prevention      Basic ADLs (From Assessment) Complex ADLs (From Assessment)   Basic ADL  Feeding: Moderate assistance   Oral Facial Hygiene/Grooming: Moderate assistance  Bathing: Maximum assistance  Upper Body Dressing: Maximum assistance  Lower Body Dressing: Maximum assistance  Toileting: Total assistance Instrumental ADL  Meal Preparation: Total assistance  Homemaking: Total assistance  Medication Management: Total assistance  Financial Management: Total assistance   Grooming/Bathing/Dressing Activities of Daily Living     Cognitive Retraining  Safety/Judgement: Decreased awareness of environment;Fall prevention           Toileting  Bowel Hygiene: Total assistance (dependent)  Clothing Management: Total assistance (dependent)     Functional Transfers  Toilet Transfer : Minimum assistance;Moderate assistance;Assist x2     Bed/Mat Mobility  Sit to Stand: Minimum assistance;Assist x2          Braces/Orthotics/Lines/Other:   ?? Hand Dominance: unknown  ?? IV  ?? O2 Device: Nasal cannula    Safety:   After treatment position/precautions:  ?? Up in chair, Bed/Chair-wheels locked and Call light within reach  Progression/Medical Necessity:   ?? Patient demonstrates fair rehab potential due to higher previous functional level.  Compliance with Program/Exercises: noncompliant some of the time.   Reason for Continuation of Services/Other Comments:  ?? Patient continues to require present interventions due to patient's inability to care for self without assistance.  Recommendations/Intent for next treatment session: Treatment next visit will focus on advancements to more challenging activities and reduction in assistance provided.  Total Treatment Duration:  Time In: 1010 (1045)  Time Out: 1040 (1100)  Heide Brossart C Owens Shark, COTA

## 2015-03-11 LAB — METABOLIC PANEL, BASIC
Anion gap: 6 mmol/L — ABNORMAL LOW (ref 7–16)
BUN: 18 MG/DL (ref 6–23)
CO2: 27 mmol/L (ref 21–32)
Calcium: 7.6 MG/DL — ABNORMAL LOW (ref 8.3–10.4)
Chloride: 105 mmol/L (ref 98–107)
Creatinine: 0.53 MG/DL — ABNORMAL LOW (ref 0.8–1.5)
GFR est AA: >60 mL/min/{1.73_m2} (ref >60)
GFR est non-AA: >60 mL/min/{1.73_m2} (ref >60)
Glucose: 102 mg/dL — ABNORMAL HIGH (ref 65–100)
Potassium: 3.5 mmol/L (ref 3.5–5.1)
Sodium: 138 mmol/L (ref 136–145)

## 2015-03-11 MED ORDER — LISINOPRIL 40 MG OR TABS
ORAL_TABLET | ORAL | Status: DC
Start: 2015-03-11 — End: 2015-07-10

## 2015-03-11 MED FILL — LORAZEPAM 2 MG/ML IJ SOLN: 2 mg/mL | INTRAMUSCULAR | Qty: 1

## 2015-03-11 MED FILL — CHLORDIAZEPOXIDE 10 MG CAP: 10 mg | ORAL | Qty: 1

## 2015-03-11 MED FILL — SOLU-MEDROL (PF) 40 MG/ML SOLUTION FOR INJECTION: 40 mg/mL | INTRAMUSCULAR | Qty: 1

## 2015-03-11 MED FILL — MONOJECT PREFILL ADVANCED (PF) 100 UNIT/ML INTRAVENOUS SYRINGE: 100 unit/mL | INTRAVENOUS | Qty: 6

## 2015-03-11 MED FILL — LOVENOX 40 MG/0.4 ML SUBCUTANEOUS SYRINGE: 40 mg/0.4 mL | SUBCUTANEOUS | Qty: 0.4

## 2015-03-11 MED FILL — LEVETIRACETAM 500 MG/5 ML IV SOLN: 500 mg/5 mL | INTRAVENOUS | Qty: 5

## 2015-03-11 MED FILL — NICOTINE 21 MG/24 HR DAILY PATCH: 21 mg/24 hr | TRANSDERMAL | Qty: 1

## 2015-03-11 NOTE — Progress Notes (Signed)
Problem: Mobility Impaired Adult Goals   UPDATED: 03/11/15  STG:  (1.)Mr. Elpers will move from supine to sit and sit to supine , scoot up and down and roll side to side with CONTACT GUARD ASSIST within 3 day(s).    (2.)Mr. Hoh will transfer from bed to chair and chair to bed with MINIMAL ASSIST using the least restrictive device within 3 day(s).    (3.)Mr. Glaspy will ambulate with MINIMAL ASSIST for 25+ feet with the least restrictive device within 3 day(s).   (4.)Mr. Harkleroad will tolerate 25+ minutes of therapeutic activity/exercise or neuromuscular re-education while maintaining stable vitals to improve functional strength and coordination within 3 day(s).  (5.)Mr. Safranek will demonstrate fair dynamic standing balance within 7 day(s).    LTG:  (1.)Mr. Burkhalter will move from supine to sit and sit to supine , scoot up and down and roll side to side in bed with INDEPENDENT within 7 day(s).    (2.)Mr. Nicklaus will transfer from bed to chair and chair to bed with CONTACT GUARD ASSIST using the least restrictive device within 7 day(s).    (3.)Mr. Pumphrey will ambulate with MINIMAL ASSIST for 50+ feet with the least restrictive device within 7 day(s).  (4.)Mr. Stawicki will demonstrate good dynamic standing balance within 7 day(s).    ________________________________________________________________________________________________  PHYSICAL THERAPY: Reassessment, Treatment Day: Day of Assessment and AM  INPATIENT: Medicaid : Hospital Day: 16    NAME/AGE/GENDER: Akon Reinoso is a 54 y.o. male  DATE: 03/11/2015  PRIMARY DIAGNOSIS: Delirium tremens ([place])  Delirium tremens ([place])  Delirium tremens ([place])        ICD-10: Treatment Diagnosis: Unsteadiness on feet; Unspecified lack of coordination   INTERDISCIPLINARY COLLABORATION: Physical Therapy Assistant, Certified Occupational Therapy Assistant and Registered Nurse  ASSESSMENT:   Mr. Betzold is a 54 year old male admitted with delirium tremens, AMS, and  suspected seizure activity and seen this AM for physical therapy re-assessment. Mr. Coke presents supine in bed, oriented x4, and agreeable to therapy with encouragement and education. At baseline, Mr. Burleson was living alone in a single story residence and was independent with ADLs and ambulates without an assistive device. During acute stay, patient has made slow, steady progress toward his acute PT goals and as of late has been progressing more quickly with mentation improvements. Today, B UE strength 4/5 with mild shoulder flexion ROM limitations, R LE strength 4-/5, L LE strength 4/5, and sensation mildly diminished to light touch L3-S1 on right. Mr. Albarran performs bed mobility with CGA to minimal assistance, sit to stand with minimal assistance, and has been able to ambulate 5-27f with RW or B handhold moderate assistance x2. Patient continues to demonstrate a flexed posture with strong posterior lean and flexed B LEs in standing despite verbal/tactle/manual cues. Impulsivity appreciated throughout with decreased safety awareness, decreased insight into deficits/need for assistance.Poor static and dynamic standing balance and patient remains a fall risk at this time. Balance deficits appear to be proprioceptive/coodination in nature. Mr. BSpikescontinues to demonstrate decreased functional mobility, safety, and balance/gait status from baseline. Will continue to follow during acute stay and progress toward UPDATED goals noted above.     Patient later brought to therapy gym to participate in B LE therapeutic exercises noted below to improve functional mobility, strength, coordination, and mobility. Tolerate well and increased repetitions. Returned to room and left sitting up in bedside chair with posey alarm activated.    ????????This section established at most recent assessment??????????  PROBLEM LIST (Impairments  causing functional limitations):  1. Decreased Strength effecting function   2. Decreased ADL/Functional Activities  3. Decreased Transfer Abilities  4. Decreased Ambulation Ability/Technique  5. Decreased Balance  6. Decreased Activity Tolerance  7. Increased Fatigue effecting function  8. Increased Shortness of Breath affecting function  9. Decreased Knowledge of precautions  10. Decreased Independence with Home Exercise Program  REHABILITATION POTENTIAL FOR STATED GOALS: GOOD      PLAN OF CARE:   INTERVENTIONS PLANNED: (Benefits and precautions of physical therapy have been discussed with the patient.)  1. balance exercise  2. bed mobility  3. family education  4. gait training  5. neuromuscular re-education/strengthening  6. range of motion: active/assisted/passive  7. therapeutic activities  8. therapeutic exercise/strengthening  9. transfer training  FREQUENCY/DURATION: Follow patient 1-2 times per day/4-7 days per week until goals are met in order to address above goals.    RECOMMENDED REHABILITATION/EQUIPMENT: (at time of discharge pending progress):   Rehab.  SUBJECTIVE:   "I am not getting up today! Okay, I will."    Present Symptoms: endorses "no" pain.   Pain Intensity 1: 0     History of Present Injury/Illness: Per chart:  Patient is a 54 y.o. white  male who presents with altered mental status.  Pt has prior admission by me for DT's in 2014.  He was brought to the ED by a friend due to diminished responsiveness and seizure at home.  No family or friends present at this time.  Pt able to give limited history due to lethargy.  He denies ever having had a seizure in his life, though he has had documented seizures previously.  Pt states he normally drinks 6-8 beers per day and hadn't been drinking as much today.  He denies chest pain, dyspnea, nausea, vomiting, abdominal pain.  He reports normally having a tremor, but it is worse at the present moment.  He also reports to me that he saw a red ball bouncing around his house today, but knew that it wasn't actually there.       Past Medical History    Diagnosis  Date    ???  Delirium tremens ([place])  September, 2014        Admitted with DTs and seizure activity.  Found to have GI bleeding secondary to gastric ullcers.    ???  Gastric ulcer  September, 2014        Found at EGD to have major gastric ulcers.          Past Surgical History    Procedure  Laterality  Date    ???  Pr neurological procedure unlisted        ???  Hx back surgery            x 2    ???  Hx endoscopy    September, 2014              Prior Level of Function/Home Situation:   History provided by patient/chart; patient is a poor historian at time of initial assessment.  Patient live alone in a private residence/apartment.  At baseline, independent with ADLs and ambulates with a straight cane.  Home Environment: Private residence  One/Two Story Residence: One story  Living Alone: Yes  Support Systems: Friends \\ neighbors  Patient Expects to be Discharged to:: Apartment  Current DME Used/Available at Home: Cane, straight, Walker, rolling  OBJECTIVE/TREATMENT:   (In addition to Assessment/Re-Assessment sessions the following treatments were rendered)  [place]??? ???6 Clicks???                                          Basic Mobility Inpatient Short Form  How much difficulty does the patient currently have... Unable A Lot A Little None   1.  Turning over in bed (including adjusting bedclothes, sheets and blankets)?   '[ ]'  1   '[ ]'  2   '[]'  3   [ X] 4   2.  Sitting down on and standing up from a chair with arms ( e.g., wheelchair, bedside commode, etc.)   '[ ]'  1   '[]'  2   [place] ] 3   '[ ]'  4   3.  Moving from lying on back to sitting on the side of the bed?   '[ ]'  1   '[ ]'  2   '[X]'  3   '[ ]'  4               How much help from another person does the patient currently need... Total A Lot A Little None   4.  Moving to and from a bed to a chair (including a wheelchair)?   '[ ]'  1   '[X]'  2   '[ ]'  3   '[ ]'  4    5.  Need to walk in hospital room?   '[ ]'  1   '[X]'  2   '[ ]'  3   '[ ]'  4   6.  Climbing 3-5 steps with a railing?   '[ ]'  1   '[X]'  2   '[ ]'  3   '[ ]'  4   ?? 2007, [employer], under license to [employer]. All rights reserved       Score:  Initial: 14 Most Recent: 16 (Date: 03/10/14 )   Interpretation of Tool:  Represents activities that are increasingly more difficult (i.e. Bed mobility, Transfers, Gait).  Score 24 23 22-20 19-15 14-10 9-7 6   Modifier CH CI CJ CK CL CM CN       ?? Mobility - Walking and Moving Around:              F0071 - CURRENT STATUS:       CK - 40%-59% impaired, limited or restricted              [id number] - GOAL STATUS:               CJ - 20%-39% impaired, limited or restricted              Q1975 - D/C STATUS:                    ---------------To be determined---------------  Payor: ADVICARE MEDICAID / Plan: [place] ADVICARE MEDICAID / Product Type: Managed Care Medicaid /    Most Recent Physical Functioning:   Gross Assessment:  AROM: Generally decreased, functional (B hip flexion; B shoulder flexion)  Strength: Generally decreased, functional (B UE 4-/5, L LE 4/5, R LE 4-/5)  Coordination: Generally decreased, functional (B LE)  Sensation: Impaired (Slightly diminished light touch L3-S1 on right)               Posture:  Posture (WDL): Exceptions to WDL  Posture Assessment: Forward head, Rounded shoulders  Balance:  Sitting: Impaired  Sitting - Static: Good (unsupported)  Sitting -  Dynamic: Fair (occasional)  Standing: Impaired  Standing - Static: Poor  Standing - Dynamic : Poor  Bed Mobility:  Rolling: Supervision  Supine to Sit: Contact guard assistance  Scooting: Contact guard assistance  Wheelchair Mobility:     Transfers:  Sit to Stand: Minimum assistance  Stand to Sit: Minimum assistance  Bed to Chair: Moderate assistance  Interventions: Safety awareness training;Tactile cues;Verbal cues  Gait:     Base of Support: Narrowed;Center of gravity altered  Speed/Cadence: Slow;Shuffled   Step Length: Right shortened;Left shortened  Gait Abnormalities: Decreased step clearance;Shuffling gait;Step to gait;Path deviations;Trunk sway increased  Distance (ft): 5 Feet (ft)  Assistive Device: Other (comment) (B handhold)  Ambulation - Level of Assistance: Moderate assistance  Interventions: Safety awareness training;Tactile cues;Verbal cues     Physical Therapy Re-Assessment (15 Minutes)    Therapeutic Exercise: (15 Minutes):  Exercises per grid below to improve mobility, strength, balance and coordination.  Required minimal visual and verbal cues to promote proper body alignment, promote proper body posture and promote proper body mechanics.     Date:  03/10/15 Date:  03/11/15 Date:     Activity/Exercise Parameters Parameters Parameters   Seated hip flexion x25B 2x20B    Seated hip ab/adductuon x20B x20B    LAQ x20B 2x20B    Ankle plantarflexion x25B x25B    Ankle dorsiflexion x25B x25B      Braces/Orthotics/Lines/Etc:   ?? None  Safety:   After treatment position/precautions:  ?? Up in chair, Bed alarm/tab alert on, Bed/Chair-wheels locked, Bed in low position, Call light within reach and RN notified  Progression/Medical Necessity:   ?? Patient demonstrates good rehab potential due to higher previous functional level.  Compliance with Program/Exercises: Will assess as treatment progresses.   Reason for Continuation of Services/Other Comments:  ?? Patient continues to require skilled intervention due to decreased functional mobility, coordination, and balance/gait status from baseline..  Recommendations/Intent for next treatment session: Treatment next visit will focus on advancements to more challenging activities and reduction in assistance provided.    Total Treatment Duration:  Time In: 1030  Time Out: [place], PT

## 2015-03-11 NOTE — Progress Notes (Signed)
Hospitalist Progress Note    03/11/2015  Admit Date: 02/24/2015 10:47 PM   NAME: Brett Becker   DOB:  07-18-1961   MRN:  [id number]   Attending: Caffie PintoAlexander M Drew, DO  PCP:  Phys Other, MD    SUBJECTIVE:   Patient 54 y.o. white?? male who presented with altered mental status on 02/25/2015.?? He has?? Had a prior admission for DT's in 2014.?? He was brought to the ED by a friend due to diminished responsiveness and seizure at home.??The Pt stateed that he normally drinks 6-8 beers per day and hadn't been drinking as much on the day of presentation.?? He also reported that he saw a red ball bouncing around his house today, but knew that it wasn't actually there. He was initially admitted to the ICU for DT's, alchol abuse and hepatitis C. He was at some point started on Keppra for seizure activity. He was assessed by speech & swallow and deemed to be an extreme aspiration risk. He was kept NPO and started on TPN.?? 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 MRI showed nothing acute. He continued to aspirate with feeds. As his mental status improved he became very upset by not being able to eat. He was re-assessed for his swallowing ability. He failed. He was re-assessed a few days later and though not a full pass he had improved. We decided to try him on a trial of po intake and he did well and has continued to improve with his po intake. He is unable to care for himself at home. He has had periods of agitation & confusion. H He is awaiting placement.    3/29 - no acute complaints, no o/n events.     Review of Systems negative with exception of pertinent positives noted above  PHYSICAL EXAM   BP 94/68 mmHg   Pulse 76   Temp(Src) 97.4 ??F (36.3 ??C)   Resp 18   Ht 5\' 8"  (1.727 m)   Wt 51.937 kg (114 lb 8 oz)   BMI 17.41 kg/m2   SpO2 97%   Temp (24hrs), Avg:97.6 ??F (36.4 ??C), Min:97.4 ??F (36.3 ??C), Max:97.9 ??F (36.6 ??C)    Oxygen Therapy  O2 Sat (%): 97 % (03/11/15 1458)   Pulse via Oximetry: 61 beats per minute (03/10/15 0303)  O2 Device: Room air (03/10/15 1541)  O2 Flow Rate (L/min): 2 l/min (03/07/15 0136)    Intake/Output Summary (Last 24 hours) at 03/11/15 1801  Last data filed at 03/11/15 1434   Gross per 24 hour   Intake    480 ml   Output      0 ml   Net    480 ml      General: No acute distress????  Lungs:  CTA Bilaterally.   Heart:  Regular rate and rhythm,?? No murmur, rub, or gallop  Abdomen: Soft, Non distended, Non tender, Positive bowel sounds  Extremities: No cyanosis, clubbing or edema  Neurologic:?? No focal deficits    ASSESSMENT      Active Hospital Problems    Diagnosis Date Noted   ??? 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 Hyponatremia 02/25/2015   ??? Hepatitis C 08/31/2013   ??? HTN (hypertension) 08/30/2013   ??? Alcohol abuse 08/28/2013     A/P  - DT's - continued scheduled librium, prn meds. Continue keprra  - Hep C - aware  - HTn - controlled  - debility - pending  rehab      DVT Prophylaxis: lovenox sq    Thera FlakeAllison C Jovaughn Wojtaszek, DO

## 2015-03-11 NOTE — Progress Notes (Signed)
Problem: Dysphagia (Adult)  Goal: *Acute Goals and Plan of Care (Insert Text)  STG: Pt will tolerate mechanical soft/honey thick liquids without signs/sx of aspiration with 100% accuracy  STG: Pt will participate with modified barium swallow study x1 (goal met 03/07/15)  STG: Pt will participate with laryngeal exercises x10 with 90% accuracy  LTG: Pt will tolerate the least restrictive diet at discharge without respiratory compromise    SPEECH LANGUAGE PATHOLOGY: BEDSIDE SWALLOW STUDY: Daily Note 3    NAME/AGE/GENDER: Brett Becker is a 54 y.o. male  DATE: 03/11/2015  PRIMARY DIAGNOSIS: Delirium tremens ([place])       ICD-10: Treatment Diagnosis: dysphagia; oropharyngeal R13.12  INTERDISCIPLINARY COLLABORATION: raddiologist  PRECAUTIONS/ALLERGIES: Codeine   ASSESSMENT/PLAN OF CARE:   Patient woke up and requested lunch.  Assisted with meal tray.  Re-assessed with chewable textures.  Requires increased time for mastication but was able to tolerate soft solids without signs/sx of aspiration.  Patient with cough x1 with larger cup sip of honey thick liquids when attempting to self administer.  Tolerated small cup sips and via the spoon.  Encouraged pt to produce effortful swallow and double swallow with trials.  Ate greater than 50% and 4oz of honey thick liquids.  Patient remains confused asking if ST is working on a college thesis about him.  Despite confusion he has been able to participate with laryngeal exercises during previous sessions.  Recommend diet upgrade to mechanical soft textures.  Continue honey thick liquids.  1:1 meal assist.    Recommend continue to follow for dysphagia treatment/laryngeal exercises.    Patient will benefit from skilled intervention to address the below impairments.  ????????This section established at most recent assessment??????????  RECOMMENDATIONS AND PLANNED INTERVENTIONS (Benefits and precautions of therapy have been discussed with the patient.):  ?? PO:  Pureed and Liquids:  honey   MEDICATIONS:  ?? with thickened liquid  COMPENSATORY STRATEGIES/MODIFICATIONS INCLUDING:  ?? Effortful swallow  ?? Small sips and bites    OTHER RECOMMENDATIONS (including follow up treatment recommendations):   ?? Tongue based expressions  ?? Family training/education  ?? Laryngeal exercises  ?? Patient education  FREQUENCY/DURATION: Continue to follow patient 3-5x a week to address above goals.   RECOMMENDED REHABILITATION/EQUIPMENT: (at time of discharge pending progress):   Rehab.      SUBJECTIVE:   Patient states "you won't be seeing more no more after today."  History of Present Injury/Illness: Brett Becker  has a past medical history of Delirium tremens [facility]) (September, 2014) and Gastric ulcer (September, 2014).  He also  has past surgical history that includes neurological procedure unlisted; back surgery; and endoscopy (September, 2014).  Present Symptoms: cough with po  Pain Intensity 1: 0  Pain Location 1: Hand  Pain Orientation 1: Left, Right  Pain Intervention(s) 1: MD notified (comment) (paging MD)    Current Dietary Status:  Puree/Honey           ?? History of reflux:  no  Social History/Home Situation: home alone  Home Environment: Private residence  One/Two Story Residence: One story  Living Alone: Yes  Support Systems: Friends \\ neighbors  Patient Expects to be Discharged to:: Apartment  Current DME Used/Available at Home: Cane, straight, Environmental consultant, rolling      OBJECTIVE:       Cognitive/Communication Status:  Mental Status  Neurologic State: Alert  Orientation Level: Oriented X4  Cognition: Decreased attention/concentration, Impulsive  Perception: Appears intact  Perseveration: Perseverates during conversation  Safety/Judgement: Decreased insight into deficits  Oral Assessment:  Oral Assessment  Labial: Decreased seal  Dentition: Limited  Lingual: Decreased rate  Mandible: No impairment  Gag Reflex: No impairment      Oral Assessment:  Oral Assessment  Labial: Decreased seal  Dentition: Limited     P.O. Trials:  Patient Position: upright edge of bed  The patient was given tsp to cup amounts of the following:   Consistency Presented: Honey thick liquid;Mechanical soft;Puree  How Presented: Self-fed/presented;Cup/sip;Spoon;SLP-fed/presented    ORAL PHASE:  Bolus Acceptance: Impaired  Bolus Formation/Control: Impaired  Propulsion: Delayed (# of seconds)  Type of Impairment: Delayed;Mastication  Oral Residue: None    PHARYNGEAL PHASE:  Initiation of Swallow: Delayed (# of seconds)  Laryngeal Elevation: Decreased  Aspiration Signs/Symptoms: Strong cough          LARYNGEAL / PHARYNGEAL EXERCISES:         n/a                                                                                                                              Dysphagia Activities: Activities/Procedures listed utilized to improve progress in swallow function. Required maximal cueing to complete laryngeal exercises.    Tool Used: Dysphagia Outcome and Severity Scale (DOSS)     Score Comments   Normal Diet  '[ ]'  7 With no strategies or extra time needed        Functional Swallow  '[ ]'  6 May have mild oral or pharyngeal delay        Mild Dysphagia     '[ ]'  5 Which may require one diet consistency restricted (those who demonstrate penetration which is entirely cleared on MBS would be included)   Mild-Moderate Dysphagia  '[ ]'  4 With 1-2 diet consistencies restricted        Moderate Dysphagia  '[ ]'  3 With 2 or more diet consistencies restricted        Moderately Severe Dysphagia  '[ ]'  2 With partial PO strategies (trials with ST only)        Severe Dysphagia  '[X]'  1 With inability to tolerate any PO safely           Score:  Initial: 1 Most Recent: 2 (Date: 03/07/15 per MBS )   Interpretation of Tool: The Dysphagia Outcome and Severity Scale (DOSS) is a simple, easy-to-use, 7-point scale developed to systematically rate the functional severity of dysphagia based on objective assessment and make  recommendations for diet level, independence level, and type of nutrition.       Score '7 6 5 4 3 2 1   ' Modifier CH CI CJ CK CL CM CN   ?? Swallowing:              P5093 - CURRENT STATUS:           CN - 100% impaired, limited or restricted              O6712 - GOAL  STATUS:                   CL - 60%-79% impaired, limited or restricted              W2585 - D/C STATUS:                       ---------------To be determined---------------  Payor: ADVICARE MEDICAID / Plan: [place] ADVICARE MEDICA[place] / Product Type: Managed Care Medicaid /   __________________________________________________________________________________________________  Safety:   After treatment position/precautions:  ?? Upright in Bed  Recommendations for treatment: laryngeal exercises  Total Treatment Duration:  Time In: 1330   Time Out: 1400     Serita Grammes MS, CCC-SLP

## 2015-03-12 LAB — METABOLIC PANEL, BASIC
Anion gap: 5 mmol/L — ABNORMAL LOW (ref 7–16)
BUN: 15 MG/DL (ref 6–23)
CO2: 27 mmol/L (ref 21–32)
Calcium: 7.7 MG/DL — ABNORMAL LOW (ref 8.3–10.4)
Chloride: 106 mmol/L (ref 98–107)
Creatinine: 0.49 MG/DL — ABNORMAL LOW (ref 0.8–1.5)
GFR est AA: >60 mL/min/{1.73_m2} (ref >60)
GFR est non-AA: >60 mL/min/{1.73_m2} (ref >60)
Glucose: 106 mg/dL — ABNORMAL HIGH (ref 65–100)
Potassium: 3.6 mmol/L (ref 3.5–5.1)
Sodium: 138 mmol/L (ref 136–145)

## 2015-03-12 LAB — CBC W/O DIFF
HCT: 38.2 % — ABNORMAL LOW (ref 41.1–50.3)
HGB: 13.3 g/dL — ABNORMAL LOW (ref 13.6–17.2)
MCH: 34.9 PG — ABNORMAL HIGH (ref 26.1–32.9)
MCHC: 34.8 g/dL (ref 31.4–35.0)
MCV: 100.3 FL — ABNORMAL HIGH (ref 79.6–97.8)
MPV: 10.5 FL — ABNORMAL LOW (ref 10.8–14.1)
PLATELET: 296 10*3/uL (ref 150–450)
RBC: 3.81 M/uL — ABNORMAL LOW (ref 4.23–5.67)
RDW: 11.9 % (ref 11.9–14.6)
WBC: 9.8 10*3/uL (ref 4.3–11.1)

## 2015-03-12 LAB — MAGNESIUM: Magnesium: 1.6 mg/dL — ABNORMAL LOW (ref 1.8–2.4)

## 2015-03-12 LAB — PHOSPHORUS: Phosphorus: 3.2 MG/DL (ref 2.5–4.5)

## 2015-03-12 MED ORDER — LORAZEPAM 2 MG/ML IJ SOLN
2 mg/mL | Freq: Four times a day (QID) | INTRAMUSCULAR | Status: DC | PRN
Start: 2015-03-12 — End: 2015-03-17

## 2015-03-12 MED FILL — MONOJECT PREFILL ADVANCED (PF) 100 UNIT/ML INTRAVENOUS SYRINGE: 100 unit/mL | INTRAVENOUS | Qty: 6

## 2015-03-12 MED FILL — CHLORDIAZEPOXIDE 10 MG CAP: 10 mg | ORAL | Qty: 1

## 2015-03-12 MED FILL — LOVENOX 40 MG/0.4 ML SUBCUTANEOUS SYRINGE: 40 mg/0.4 mL | SUBCUTANEOUS | Qty: 0.4

## 2015-03-12 MED FILL — LEVETIRACETAM 500 MG/5 ML IV SOLN: 500 mg/5 mL | INTRAVENOUS | Qty: 5

## 2015-03-12 MED FILL — TRANSDERM-SCOP 1 MG OVER 3 DAYS TRANSDERMAL PATCH: 1 mg over 3 days | TRANSDERMAL | Qty: 1

## 2015-03-12 MED FILL — LORAZEPAM 2 MG/ML IJ SOLN: 2 mg/mL | INTRAMUSCULAR | Qty: 1

## 2015-03-12 MED FILL — NICOTINE 21 MG/24 HR DAILY PATCH: 21 mg/24 hr | TRANSDERMAL | Qty: 1

## 2015-03-12 NOTE — Progress Notes (Signed)
Problem: Dysphagia (Adult)  Goal: *Acute Goals and Plan of Care (Insert Text)  STG: Pt will tolerate mechanical soft/honey thick liquids without signs/sx of aspiration with 100% accuracy  STG: Pt will participate with modified barium swallow study x1 (goal met 03/07/15)  STG: Pt will participate with laryngeal exercises x10 with 90% accuracy  LTG: Pt will tolerate the least restrictive diet at discharge without respiratory compromise    SPEECH LANGUAGE PATHOLOGY: BEDSIDE SWALLOW STUDY: Daily Note 4    NAME/AGE/GENDER: Brett Becker is a 54 y.o. 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  Recommend continue to follow for dysphagia treatment/laryngeal exercises.    Patient will benefit from skilled intervention to address the below impairments.  ????????This section established at most recent assessment??????????  RECOMMENDATIONS AND PLANNED INTERVENTIONS (Benefits and precautions of therapy have been discussed with the patient.):  ?? PO:  Pureed and Liquids:  honey  MEDICATIONS:  ?? with thickened liquid  COMPENSATORY STRATEGIES/MODIFICATIONS INCLUDING:  ?? Effortful swallow  ?? Small sips and bites    OTHER RECOMMENDATIONS (including follow up treatment recommendations):   ?? Tongue based expressions  ?? Family training/education  ?? Laryngeal exercises  ?? Patient education  FREQUENCY/DURATION: Continue to follow patient 3-5x a week to address above goals.   RECOMMENDED REHABILITATION/EQUIPMENT: (at time of discharge pending progress):   Rehab.      SUBJECTIVE:   cooperative  History of Present Injury/Illness: Brett Becker  has a past medical history of Delirium tremens [facility]) (September, 2014) and Gastric ulcer (September,  2014).  He also  has past surgical history that includes neurological procedure unlisted; back surgery; and endoscopy (September, 2014).  Present Symptoms: cough with po  Pain Intensity 1: 0  Pain Location 1: Hand  Pain Orientation 1: Left, Right  Pain Intervention(s) 1: MD notified (comment) (paging MD)    Current Dietary Status:  Puree/Honey           ?? History of reflux:  no  Social History/Home Situation: home alone  Home Environment: Private residence  One/Two Story Residence: One story  Living Alone: Yes  Support Systems: Friends \\ neighbors  Patient Expects to be Discharged to:: Apartment  Current DME Used/Available at Home: Cane, straight, Environmental consultant, rolling      OBJECTIVE:       Cognitive/Communication Status:  Mental Status  Neurologic State: Alert  Orientation Level: Oriented X4  Cognition: Follows commands, Impulsive  Perception: Appears intact  Perseveration: Perseverates during conversation  Safety/Judgement: Decreased insight into deficits    Oral Assessment:  Oral Assessment  Labial: Decreased seal  Dentition: Limited  Lingual: Decreased rate  Mandible: No impairment  Gag Reflex: No impairment       LARYNGEAL / PHARYNGEAL EXERCISES:         n/a  Effortful Swallow: Yes  Reps : 5  Sets : 1  Hard Glottal Attack: Yes  Reps : 10  Sets : 2                       Masako: Yes  Reps : 5  Sets : 1  Sing "EEE": Yes  Reps : 5  Sets : 1                    Sustained "ah": Yes  Sets : 1  Reps : 5                Dysphagia Activities: Activities/Procedures listed utilized to improve progress in swallow function. Required maximal cueing to complete laryngeal exercises.    Tool Used: Dysphagia Outcome and Severity Scale (DOSS)     Score Comments   Normal Diet  _0  7 With no strategies or extra time needed        Functional Swallow  _1  6 May have mild oral or pharyngeal delay        Mild Dysphagia     _2  5 Which may require one diet consistency restricted (those who  demonstrate penetration which is entirely cleared on MBS would be included)   Mild-Moderate Dysphagia  _3  4 With 1-2 diet consistencies restricted        Moderate Dysphagia  _4  3 With 2 or more diet consistencies restricted        Moderately Severe Dysphagia  _5  2 With partial PO strategies (trials with ST only)        Severe Dysphagia  _6  1 With inability to tolerate any PO safely           Score:  Initial: 1 Most Recent: 2 (Date: 03/07/15 per MBS )   Interpretation of Tool: The Dysphagia Outcome and Severity Scale (DOSS) is a simple, easy-to-use, 7-point scale developed to systematically rate the functional severity of dysphagia based on objective assessment and make recommendations for diet level, independence level, and type of nutrition.       Score _7 Modifier CH CI CJ CK CL CM CN   ?? Swallowing:              U1324 - CURRENT STATUS:           CN - 100% impaired, limited or restricted              M0102 - GOAL STATUS:                   CL - 60%-79% impaired, limited or restricted              V2536 - D/C STATUS:                       ---------------To be determined---------------  Payor: ADVICARE MEDICAID / Plan: [place]VICARE MEDICA[place] / Product Type: Managed Care Medicaid /   __________________________________________________________________________________________________  Safety:   After treatment position/precautions:  ?? Upright in Bed  Recommendations for treatment: laryngeal exercises  Total Treatment Duration:  Time In: 0915   Time Out: 0930      Jannet Mantis MA/CCC/SLP

## 2015-03-12 NOTE — Progress Notes (Signed)
Hospitalist Progress Note    03/12/2015  Admit Date: 02/24/2015 10:47 PM   NAME: Brett Becker   DOB:  10-08-1961   MRN:  [id number]   Attending: Caffie PintoAlexander M Drew, DO  PCP:  Phys Other, MD    SUBJECTIVE:   Patient 54 y.o. white?? male who presented with altered mental status on 02/25/2015.?? He has?? Had a prior admission for DT's in 2014.?? He was brought to the ED by a friend due to diminished responsiveness and seizure at home.??The Pt stateed that he normally drinks 6-8 beers per day and hadn't been drinking as much on the day of presentation.?? He also reported that he saw a red ball bouncing around his house today, but knew that it wasn't actually there. He was initially admitted to the ICU for DT's, alchol abuse and hepatitis C. He was at some point started on Keppra for seizure activity. He was assessed by speech & swallow and deemed to be an extreme aspiration risk. He was kept NPO and started on TPN.?? 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 MRI showed nothing acute. He continued to aspirate with feeds. As his mental status improved he became very upset by not being able to eat. He was re-assessed for his swallowing ability. He failed. He was re-assessed a few days later and though not a full pass he had improved. We decided to try him on a trial of po intake and he did well and has continued to improve with his po intake. He is unable to care for himself at home. He has had periods of agitation & confusion. H He is awaiting placement.    3/30- no acute complaints, no o/n events. Eating mech soft lunch during interview    Review of Systems negative with exception of pertinent positives noted above  PHYSICAL EXAM   BP 119/70 mmHg   Pulse 64   Temp(Src) 97.5 ??F (36.4 ??C)   Resp 18   Ht 5\' 8"  (1.727 m)   Wt 51.937 kg (114 lb 8 oz)   BMI 17.41 kg/m2   SpO2 92%   Temp (24hrs), Avg:97.7 ??F (36.5 ??C), Min:97.2 ??F (36.2 ??C), Max:98.3 ??F (36.8 ??C)    Oxygen Therapy   O2 Sat (%): 92 % (03/12/15 1502)  Pulse via Oximetry: 61 beats per minute (03/10/15 0303)  O2 Device: Room air (03/10/15 1541)  O2 Flow Rate (L/min): 2 l/min (03/07/15 0136)    Intake/Output Summary (Last 24 hours) at 03/12/15 1631  Last data filed at 03/11/15 1841   Gross per 24 hour   Intake    240 ml   Output      0 ml   Net    240 ml      General: No acute distress????  Lungs:  CTA Bilaterally.   Heart:  Regular rate and rhythm,?? No murmur, rub, or gallop  Abdomen: Soft, Non distended, Non tender, Positive bowel sounds  Extremities: No cyanosis, clubbing or edema  Neurologic:?? No focal deficits    ASSESSMENT      Active Hospital Problems    Diagnosis Date Noted   ??? 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 Hyponatremia 02/25/2015   ??? Hepatitis C 08/31/2013   ??? HTN (hypertension) 08/30/2013   ??? Alcohol abuse 08/28/2013     A/P  - DT's - continued scheduled librium, prn meds. Continue keprra  - Hep C - aware  - HTn - controlled  -  debility - pending rehab      DVT Prophylaxis: lovenox sq    Thera Flake, DO

## 2015-03-12 NOTE — Progress Notes (Signed)
Still waiting on insurance approval and [facility] to complete their assessment.

## 2015-03-13 LAB — METABOLIC PANEL, BASIC
Anion gap: 7 mmol/L (ref 7–16)
BUN: 12 MG/DL (ref 6–23)
CO2: 27 mmol/L (ref 21–32)
Calcium: 7.9 MG/DL — ABNORMAL LOW (ref 8.3–10.4)
Chloride: 105 mmol/L (ref 98–107)
Creatinine: 0.66 MG/DL — ABNORMAL LOW (ref 0.8–1.5)
GFR est AA: >60 mL/min/{1.73_m2} (ref >60)
GFR est non-AA: >60 mL/min/{1.73_m2} (ref >60)
Glucose: 83 mg/dL (ref 65–100)
Potassium: 3.3 mmol/L — ABNORMAL LOW (ref 3.5–5.1)
Sodium: 139 mmol/L (ref 136–145)

## 2015-03-13 MED ORDER — LEVETIRACETAM 500 MG TAB
500 mg | Freq: Two times a day (BID) | ORAL | Status: DC
Start: 2015-03-13 — End: 2015-03-17
  Administered 2015-03-13 – 2015-03-17 (×10): via ORAL

## 2015-03-13 MED FILL — NICOTINE 21 MG/24 HR DAILY PATCH: 21 mg/24 hr | TRANSDERMAL | Qty: 1

## 2015-03-13 MED FILL — MONOJECT PREFILL ADVANCED (PF) 100 UNIT/ML INTRAVENOUS SYRINGE: 100 unit/mL | INTRAVENOUS | Qty: 6

## 2015-03-13 MED FILL — CHLORDIAZEPOXIDE 10 MG CAP: 10 mg | ORAL | Qty: 1

## 2015-03-13 MED FILL — LEVETIRACETAM 500 MG/5 ML IV SOLN: 500 mg/5 mL | INTRAVENOUS | Qty: 5

## 2015-03-13 MED FILL — LEVETIRACETAM 500 MG TAB: 500 mg | ORAL | Qty: 1

## 2015-03-13 MED FILL — LOVENOX 40 MG/0.4 ML SUBCUTANEOUS SYRINGE: 40 mg/0.4 mL | SUBCUTANEOUS | Qty: 0.4

## 2015-03-13 NOTE — Other (Signed)
Notified Bouadou, MD and lab- patient pulled right picc line out. 38 cm was document and 38 cm was intact. New order to place peripheral IV.

## 2015-03-13 NOTE — Progress Notes (Signed)
Dr. Wilson SingerHudson notified that pt's PICC line had been pulled out by pt during night shift. Dr. Wilson SingerHudson verbalized understanding.

## 2015-03-13 NOTE — Progress Notes (Signed)
Hospitalist Progress Note    03/13/2015  Admit Date: 02/24/2015 10:47 PM   NAME: Brett Becker   DOB:  [birth year]   MRN:  [id number]   Attending: Caffie PintoAlexander M Drew, DO  PCP:  Phys Other, MD    SUBJECTIVE:   Patient 54 y.o. white?? male who presented with altered mental status on 02/25/2015.?? He has?? Had a prior admission for DT's in 2014.?? He was brought to the ED by a friend due to diminished responsiveness and seizure at home.??The Pt stateed that he normally drinks 6-8 beers per day and hadn't been drinking as much on the day of presentation.?? He also reported that he saw a red ball bouncing around his house today, but knew that it wasn't actually there. He was initially admitted to the ICU for DT's, alchol abuse and hepatitis C. He was at some point started on Keppra for seizure activity. He was assessed by speech & swallow and deemed to be an extreme aspiration risk. He was kept NPO and started on TPN.?? 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 MRI showed nothing acute. He continued to aspirate with feeds. As his mental status improved he became very upset by not being able to eat. He was re-assessed for his swallowing ability. He failed. He was re-assessed a few days later and though not a full pass he had improved. We decided to try him on a trial of po intake and he did well and has continued to improve with his po intake. He is unable to care for himself at home. He has had periods of agitation & confusion. H He is awaiting placement.    3/31 - pt has pulled picc line last evening. Peripheral iv line re-inserted. No acute complaints when seen at bedside.     Review of Systems negative with exception of pertinent positives noted above  PHYSICAL EXAM   BP 111/83 mmHg   Pulse 80   Temp(Src) 98 ??F (36.7 ??C)   Resp 18   Ht 5\' 8"  (1.727 m)   Wt 51.937 kg (114 lb 8 oz)   BMI 17.41 kg/m2   SpO2 98%   Temp (24hrs), Avg:97.8 ??F (36.6 ??C), Min:97.4 ??F (36.3 ??C), Max:98.2 ??F (36.8 ??C)     Oxygen Therapy  O2 Sat (%): 98 % (03/13/15 1250)  Pulse via Oximetry: 61 beats per minute (03/10/15 0303)  O2 Device: Room air (03/13/15 1250)  O2 Flow Rate (L/min): 2 l/min (03/07/15 0136)    Intake/Output Summary (Last 24 hours) at 03/13/15 1407  Last data filed at 03/13/15 1252   Gross per 24 hour   Intake    240 ml   Output      0 ml   Net    240 ml      General: No acute distress????  Lungs:  CTA Bilaterally.   Heart:  Regular rate and rhythm,?? No murmur, rub, or gallop  Abdomen: Soft, Non distended, Non tender, Positive bowel sounds  Extremities: No cyanosis, clubbing or edema  Neurologic:?? No focal deficits    ASSESSMENT      Active Hospital Problems    Diagnosis Date Noted   ??? 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 Hyponatremia 02/25/2015   ??? Hepatitis C 08/31/2013   ??? HTN (hypertension) 08/30/2013   ??? Alcohol abuse 08/28/2013     A/P  - DT's - continued scheduled librium, prn meds. Continue keprra (change to oral)  -  Hep C - aware  - HTn - controlled  - debility - pending rehab      DVT Prophylaxis: lovenox sq    Thera Flake, DO

## 2015-03-13 NOTE — Progress Notes (Signed)
Problem: Self Care Deficits Care Plan (Adult)  Goal: *Acute Goals and Plan of Care (Insert Text)    1. Brett Becker will feed self entire meal with set-up. DISCONTINUE   2. Brett Becker will complete grooming with set-up. GOAL MET  3. Brett Becker will tolerate 15 minutes of OT treatment with no rest breaks to increase activity tolerance for ADLs. GOAL MET  4. Brett Becker will complete functional transfers with minimal assistance and adaptive equipment as needed. CONTINUE  5. Brett Becker will demonstrate fair standing balance to increase safety during standing ADLs. CONTINUE    New goals added 03/13/15:   6. Brett Becker will participate in at least 25 minutes of BUE therapeutic exercises to increase strength for  functional transfers and ADLs.   7. Brett Becker will complete upper body bathing and dressing with set-up.   8. Brett Becker will complete lower body bathing and dressing with minimal assistance.   9. Brett Becker will complete toileting with minimal assistance.     Timeframe: 7 visits      OCCUPATIONAL THERAPY: Daily Note, Reassessment and Treatment Day: 1st  INPATIENT: Medicaid : Hospital Day: 18    NAME/AGE/GENDER: Brett Becker is a 54 y.o. 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 Overall, Brett Becker is much improved compared to initial eval. He has met several goals. BUE strength is better and he is requiring less assistance with ADLs. He has been feeding himself daily. Cognition appears to be clearing and Brett Becker is more oriented. He was actively participating in session today. Still with slurred speech, strong posterior lean, decreased safety awareness,  and overall decreased independence with ADLs and functional transfers. Will continue to follow for acute OT with the above updated goals. After re-assessment Brett Becker was brought to therapy gym for group therapeutic exercise session. He was able to follow all exercises and complete with minimal verbal cues and yellow theraband. He was pleasant and appropriate in his interactions with the other patients. Overall, good progress. Will continue efforts.     ????????This section established at most recent assessment??????????  PROBLEM LIST (Impairments causing functional limitations):  1. Decreased Strength effecting function  2. Decreased ADL/Functional Activities  3. Decreased Transfer Abilities  4. Decreased Ambulation Ability/Technique  5. Decreased Balance  6. Decreased Activity Tolerance  7. Impaired balance affecting function  8. Impaired coordination affecting function  REHABILITATION POTENTIAL FOR STATED GOALS: GUARDED  PLAN OF CARE:  INTERVENTIONS PLANNED: (Benefits and precautions of occupational therapy have been discussed with the Brett Becker.)  1. Activities of daily living training  2. Adaptive equipment training  3. Cognitive training  4. Donning&doffing training  5. Group therapy  6. Theraputic activity  7. Theraputic exercise  FREQUENCY/DURATION: Follow Brett Becker 1-2 times per day/3-5 days per week until goals are met in order to address above goals.    RECOMMENDED REHABILITATION/EQUIPMENT: (at time of discharge pending progress):   Continue OT.  SUBJECTIVE:   "You look like a movie star."    History of Present Injury/Illness: Per H&P, "Brett Becker is a 54 y.o. white  male who presents with altered mental status.  Pt has prior admission by me for DT's in 2014.  He was brought to the ED by a friend due to diminished responsiveness and seizure at home.  No family or friends present at this time.  Pt able to give limited history due to lethargy.  He denies ever having had a seizure in his life, though he has had  documented seizures previously.  Pt states he normally drinks 6-8 beers per day and hadn't been drinking as much today.  He denies chest pain, dyspnea, nausea, vomiting, abdominal pain.  He reports normally having a tremor, but it is worse at the present moment.  He also reports to me that he saw a red ball bouncing around his house today, but knew that it wasn't actually there."  Present Symptoms: confusion    Pain Intensity 1: 0  Prior Level of Function/Home Situation: Brett Becker is a poor historian but reports that he lives with his friend. He says he is independent with ADLs. He uses a bike for communication and does not drive.    Home Environment: Private residence  One/Two Story Residence: One story  Living Alone: Yes  Support Systems: Friends \\ neighbors  Brett Becker Expects to be Discharged to:: Apartment  Current DME Used/Available at Home: Cane, straight, Environmental consultant, rolling  OBJECTIVE/TREATMENT:   Re-assessment (10:16-10:30)  GROUP Therapeutic Exercise (10:40-11:00): (20 minutes):  Exercises per grid below to improve strength, coordination and activity tolerance for ADLs.  Required minimal verbal cues to promote proper body posture and to stay on task.  Progressed resistance, repetitions and complexity of movement as indicated.      Exercises completed with yellow theraband Date:  03/04/2015 Date:  03/10/2015  With red theraband Date:  03/13/15  (yellow theraband)   Activity/Exercise Parameters Parameters Parameters   Horizontal Shoulder Abd/Adduction 15 reps 15 reps 10 reps BUE   Shoulder Flexion 10 reps 10 reps 15 reps BUE   Elbow Flexion 15 reps 15 reps 15 reps BUE   Punches 10 reps 15 reps 15 reps [place] AM-PACTM "6 Clicks"                                                       Daily Activity Inpatient Short Form  How much help from another person does the Brett Becker currently need... Total A Lot A Little None    1.  Putting on and taking off regular lower body clothing?   '[]'  1   [ X] 2   '[ ]'  3   '[ ]'  4   2.  Bathing (including washing, rinsing, drying)?   '[]'  1   [ X] 2   '[ ]'  3   '[ ]'  4   3.  Toileting, which includes using toilet, bedpan or urinal?   '[]'  1   [place] ] 2   '[ ]'  3   '[ ]'  4   4.  Putting on and taking off regular upper body clothing?   '[ ]'  1   '[]'   2   [ X] 3   '[ ]'  4   5.  Taking care of personal grooming such as brushing teeth?   '[ ]'  1   '[]'  2   [ X] 3   '[ ]'  4   6.  Eating meals?   '[ ]'  1   [ 2   [ X] 3   '[ ]'  4   ?? 2007, [employer], under license to [employer]. All rights reserved       Score:  Initial: 9 Most Recent: 15 (Date: -- )   Interpretation of Tool:  Represents clinically-significant functional categories (i.e.Activities of daily living).  Score 24 23 22-20 19-14 13-9 8-7 6   Modifier CH CI CJ CK CL CM CN       ?? Self Care:              [phone] - CURRENT STATUS:           CK - 40%-59% impaired, limited or restricted              [id number] - GOAL STATUS:                   CJ - 20%-39% impaired, limited or restricted              K4401 - D/C STATUS:                       ---------------To be determined---------------  Payor: ADVICARE MEDICAID / Plan[place] / Product Type: Managed Care Medicaid /      Balance  Sitting: Impaired  Sitting - Static: Good (unsupported)  Sitting - Dynamic: Fair (occasional) (+)  Standing: Impaired  Standing - Static: Poor  Standing - Dynamic : Poor       Brett Becker Vitals for the past 6 hrs:   BP BP Brett Becker Position SpO2 Pulse   03/13/15 1250 111/83 mmHg Sitting 98 % 80                           Most Recent Physical Functioning:   Gross Assessment:  AROM: Generally decreased, functional (BUE)  Strength: Generally decreased, functional (BUE)  Coordination: Generally decreased, functional (BUE)               Posture:     Balance:  Sitting: Impaired  Sitting - Static: Good (unsupported)  Sitting - Dynamic: Fair (occasional) (+)  Standing: Impaired   Standing - Static: Poor  Standing - Dynamic : Poor  Bed Mobility:  Supine to Sit: Minimum assistance  Scooting: Minimum assistance  Wheelchair Mobility:     Transfers:  Sit to Stand: Minimum assistance;Assist x2  Bed to Chair: Maximum assistance;Assist x2     Mental Status  Neurologic State: Alert  Orientation Level: Oriented X4  Cognition: Impaired decision making  Perception: Appears intact  Perseveration: Perseverates during conversation  Safety/Judgement: Fall prevention      Basic ADLs (From Assessment) Complex ADLs (From Assessment)   Basic ADL  Feeding: Minimum assistance  Oral Facial Hygiene/Grooming: Minimum assistance  Bathing: Moderate assistance  Upper Body Dressing: Minimum assistance  Lower Body Dressing: Moderate assistance  Toileting: Moderate assistance Instrumental ADL  Meal Preparation: Total assistance  Homemaking: Total assistance  Medication Management: Total assistance  Financial Management: Total assistance   Grooming/Bathing/Dressing Activities of Daily Living     Cognitive Retraining  Safety/Judgement: Fall prevention  Bed/Mat Mobility  Supine to Sit: Minimum assistance  Sit to Stand: Minimum assistance;Assist x2  Bed to Chair: Maximum assistance;Assist x2  Scooting: Minimum assistance          Braces/Orthotics/Lines/Other:   ?? Hand Dominance: unknown  ?? IV  ?? O2 Device: Nasal cannula    Safety:   After treatment position/precautions:  ?? Up in chair, Bed alarm/tab alert on, Bed/Chair-wheels locked, Call light within reach and RN notified    Progression/Medical Necessity:   ?? Brett Becker demonstrates fair rehab potential due to higher previous functional level.  Compliance with Program/Exercises: compliant some of the time.    Reason for Continuation of Services/Other Comments:  ?? Brett Becker continues to require present interventions due to Brett Becker's inability to care for self without assistance.  Recommendations/Intent for next treatment session: Treatment next visit  will focus on advancements to more challenging activities and reduction in assistance provided.  Total Treatment Duration:  Time In: 1016 (1040)  Time Out: 1030 (1100)  Callaway P Banks, OTR/L

## 2015-03-13 NOTE — Progress Notes (Signed)
Problem: Mobility Impaired Adult Goals   UPDATED: 03/11/15  STG:  (1.)[name] will move from supine to sit and sit to supine , scoot up and down and roll side to side with CONTACT GUARD ASSIST within 3 day(s).    (2.)[name] will transfer from bed to chair and chair to bed with MINIMAL ASSIST using the least restrictive device within 3 day(s).    (3.)[name] will ambulate with MINIMAL ASSIST for 25+ feet with the least restrictive device within 3 day(s).   (4.)[name] will tolerate 25+ minutes of therapeutic activity/exercise or neuromuscular re-education while maintaining stable vitals to improve functional strength and coordination within 3 day(s).  (5.)[name] will demonstrate fair dynamic standing balance within 7 day(s).    LTG:  (1.)[name] will move from supine to sit and sit to supine , scoot up and down and roll side to side in bed with INDEPENDENT within 7 day(s).    (2.)[name] will transfer from bed to chair and chair to bed with CONTACT GUARD ASSIST using the least restrictive device within 7 day(s).    (3.)[name] will ambulate with MINIMAL ASSIST for 50+ feet with the least restrictive device within 7 day(s).  (4.)[name] will demonstrate good dynamic standing balance within 7 day(s).    ________________________________________________________________________________________________  PHYSICAL THERAPY: Daily Note, Treatment Day: 1st and AM  INPATIENT: Medicaid : Hospital Day: 18    NAME/AGE/GENDER: Brett Becker is a 54 y.o. male  DATE: 03/13/2015  PRIMARY DIAGNOSIS: Delirium tremens ([place])  Delirium tremens ([place])  Delirium tremens ([place])        ICD-10: Treatment Diagnosis: Unsteadiness on feet; Unspecified lack of coordination   INTERDISCIPLINARY COLLABORATION: Physical Therapy Assistant, Occupational Therapist and Registered Nurse  ASSESSMENT:   [name] is a 54 year old male admitted with delirium tremens, AMS, and suspected seizure activity.  [name] presents supine in bed, oriented  x4, and agreeable to therapy with encouragement and education. At baseline, [name] was living alone in a single story residence and was independent with ADLs and ambulates without an assistive device.  [name] performs bed mobility with minimal assistance, sit to stand with minimal assistance, and was able to ambulate 5 ft with RW and moderate assistance x2.  Patient continues to demonstrate a flexed posture with strong posterior lean and flexed B LEs in standing despite verbal/tactle/manual cues.  Pt is impulsive with decreased safety awareness, decreased insight into deficits/need for assistance.  Patient remains a fall risk at this time.  Pt was later taken to therapy gym where he was able to participate in group exercises as below.  Pt was returned to room and left sitting up with posey on and needs in reach.  Will continue to follow during acute stay.     ????????This section established at most recent assessment??????????  PROBLEM LIST (Impairments causing functional limitations):  1. Decreased Strength effecting function  2. Decreased ADL/Functional Activities  3. Decreased Transfer Abilities  4. Decreased Ambulation Ability/Technique  5. Decreased Balance  6. Decreased Activity Tolerance  7. Increased Fatigue effecting function  8. Increased Shortness of Breath affecting function  9. Decreased Knowledge of precautions  10. Decreased Independence with Home Exercise Program  REHABILITATION POTENTIAL FOR STATED GOALS: GOOD      PLAN OF CARE:   INTERVENTIONS PLANNED: (Benefits and precautions of physical therapy have been discussed with the patient.)  1. balance exercise  2. bed mobility  3. family education  4. gait training  5. neuromuscular re-education/strengthening  6. range of motion: active/assisted/passive  7. therapeutic activities  8. therapeutic exercise/strengthening  9. transfer training  FREQUENCY/DURATION: Follow patient 1-2 times per day/4-7 days per week  until goals are met in order to address above goals.    RECOMMENDED REHABILITATION/EQUIPMENT: (at time of discharge pending progress):   Rehab.  SUBJECTIVE:   "I can do that." (Talking about exercising.)    Present Symptoms: endorses "no" pain.         History of Present Injury/Illness: Per chart:  Patient is a 54 y.o. white  male who presents with altered mental status.  Pt has prior admission by me for DT's in 2014.  He was brought to the ED by a friend due to diminished responsiveness and seizure at home.  No family or friends present at this time.  Pt able to give limited history due to lethargy.  He denies ever having had a seizure in his life, though he has had documented seizures previously.  Pt states he normally drinks 6-8 beers per day and hadn't been drinking as much today.  He denies chest pain, dyspnea, nausea, vomiting, abdominal pain.  He reports normally having a tremor, but it is worse at the present moment.  He also reports to me that he saw a red ball bouncing around his house today, but knew that it wasn't actually there.      Past Medical History    Diagnosis  Date    ???  Delirium tremens ([place])  September, 2014        Admitted with DTs and seizure activity.  Found to have GI bleeding secondary to gastric ullcers.    ???  Gastric ulcer  September, 2014        Found at EGD to have major gastric ulcers.          Past Surgical History    Procedure  Laterality  Date    ???  Pr neurological procedure unlisted        ???  Hx back surgery            x 2    ???  Hx endoscopy    September, 2014              Prior Level of Function/Home Situation:   History provided by patient/chart; patient is a poor historian at time of initial assessment.  Patient live alone in a private residence/apartment.  At baseline, independent with ADLs and ambulates with a straight cane.  Home Environment: Private residence  One/Two Story Residence: One story  Living Alone: Yes  Support Systems: Friends \\ neighbors   Patient Expects to be Discharged to:: Apartment  Current DME Used/Available at Home: Cane, straight, Environmental consultant, rolling  OBJECTIVE/TREATMENT:   (In addition to Assessment/Re-Assessment sessions the following treatments were rendered)                                      [facility]??? ???6 Clicks???                                          Basic Mobility Inpatient Short Form  How much difficulty does the patient currently have... Unable A Lot A Little None   1.  Turning over in bed (including adjusting bedclothes, sheets and  blankets)?   '[ ]'  1   '[ ]'  2   '[]'  3   [ X] 4   2.  Sitting down on and standing up from a chair with arms ( e.g., wheelchair, bedside commode, etc.)   '[ ]'  1   '[]'  2   Valu.Nieves ] 3   '[ ]'  4   3.  Moving from lying on back to sitting on the side of the bed?   '[ ]'  1   '[ ]'  2   '[X]'  3   '[ ]'  4               How much help from another person does the patient currently need... Total A Lot A Little None   4.  Moving to and from a bed to a chair (including a wheelchair)?   '[ ]'  1   '[X]'  2   '[ ]'  3   '[ ]'  4   5.  Need to walk in hospital room?   '[ ]'  1   '[X]'  2   '[ ]'  3   '[ ]'  4   6.  Climbing 3-5 steps with a railing?   '[ ]'  1   '[X]'  2   '[ ]'  3   '[ ]'  4   ?? 2007, [employer], under license to [employer]. All rights reserved       Score:  Initial: 14 Most Recent: 16 (Date: 03/10/14 )   Interpretation of Tool:  Represents activities that are increasingly more difficult (i.e. Bed mobility, Transfers, Gait).  Score 24 23 22-20 19-15 14-10 9-7 6   Modifier CH CI CJ CK CL CM CN       ?? Mobility - Walking and Moving Around:              E9407 - CURRENT STATUS:       CK - 40%-59% impaired, limited or restricted              [id number] - GOAL STATUS:               CJ - 20%-39% impaired, limited or restricted              W8088 - D/C STATUS:                    ---------------To be determined---------------  Payor: ADVICARE MEDICAID / Plan: [place] ADVICARE MEDICA[place] / Product Type: Managed Care Medicaid /     Most Recent Physical Functioning:   Gross Assessment:                  Posture:  Posture (WDL): Exceptions to WDL  Posture Assessment: Forward head, Rounded shoulders  Balance:  Sitting: Impaired  Sitting - Static: Good (unsupported)  Sitting - Dynamic: Fair (occasional) (+)  Standing: Impaired  Standing - Static: Poor  Standing - Dynamic : Poor  Bed Mobility:  Supine to Sit: Minimum assistance  Scooting: Minimum assistance  Wheelchair Mobility:     Transfers:  Sit to Stand: Minimum assistance;Assist x2  Bed to Chair: Maximum assistance;Assist x2  Gait:     Base of Support: Center of gravity altered;Narrowed  Speed/Cadence: Shuffled;Slow  Step Length: Left shortened;Right shortened  Gait Abnormalities: Decreased step clearance;Path deviations;Shuffling gait  Distance (ft): 6 Feet (ft)  Assistive Device: Walker, rolling  Ambulation - Level of Assistance: Moderate assistance;Assist x2  Interventions: Manual cues;Safety awareness training;Verbal cues        Gait  Training (  8 minutes):  Gait training to improve and/or restore physical functioning as related to mobility, strength and balance.  Ambulated 6 Feet (ft) with Moderate assistance;Assist x2 using a Walker, rolling and moderate Manual cues;Safety awareness training;Verbal cues related to their stance phase, stride length, heel strike, ankle position and motion, hip position and motion and guidance, safety and sequencing to promote proper body alignment, promote proper body posture and promote proper body mechanics.      Group Therapeutic Exercise: (15 Minutes):  Exercises per grid below to improve mobility, strength, balance and coordination.  Required minimal visual and verbal cues to promote proper body alignment, promote proper body posture and promote proper body mechanics.     Date:  03/10/15 Date:  03/11/15 Date:  03/13/15   Activity/Exercise Parameters Parameters Group exs - AM   Seated hip flexion x25B 2x20B x20B   Seated hip ab/adductuon x20B x20B x20B    LAQ x20B 2x20B x20B   Ankle plantarflexion x25B x25B x20B   Ankle dorsiflexion x25B x25B x20B     Braces/Orthotics/Lines/Etc:   ?? None  Safety:   After treatment position/precautions:  ?? Up in chair, Bed alarm/tab alert on, Bed/Chair-wheels locked, Bed in low position, Call light within reach and RN notified  Progression/Medical Necessity:   ?? Patient demonstrates good rehab potential due to higher previous functional level.  Compliance with Program/Exercises: Will assess as treatment progresses.   Reason for Continuation of Services/Other Comments:  ?? Patient continues to require skilled intervention due to decreased functional mobility, coordination, and balance/gait status from baseline..  Recommendations/Intent for next treatment session: Treatment next visit will focus on advancements to more challenging activities and reduction in assistance provided.    Total Treatment Duration:  Time In: 1016 (1040)  Time Out: 1030 (1100)  DAWN H BRACKEN, PTA

## 2015-03-13 NOTE — Progress Notes (Signed)
END OF SHIFT NOTE:    INTAKE/OUTPUT     Voiding: YES  Catheter: NO  Color: clear  Drain:              DIET  mech soft    Flatus: Patient does have flatus present.    Stool:  1 occurrences.    Characteristics:  Stool Assessment  Stool Color: Coffin  Stool Appearance: Formed  Stool Amount: Large  Stool Source/Status: Rectum    Ambulating  NO    Emesis: 0 occurrences.    Characteristics:          VITAL SIGNS  Patient Vitals for the past 12 hrs:   Temp Pulse Resp BP SpO2   03/13/15 1542 98 ??F (36.7 ??C) 70 19 114/74 mmHg 98 %   03/13/15 1250 98 ??F (36.7 ??C) 80 18 111/83 mmHg 98 %   03/13/15 0721 98 ??F (36.7 ??C) 62 19 127/77 mmHg 92 %       Pain Assessment  Pain Intensity 1: 0 (03/13/15 1300)  Pain Location 1: Back  Pain Intervention(s) 1: Repositioned  Patient Stated Pain Goal: 0    Neurological ROS: no TIA or stroke symptoms        MEGAN Ivory BroadM KELLY, RN

## 2015-03-13 NOTE — Progress Notes (Signed)
Problem: Dysphagia (Adult)  Goal: *Acute Goals and Plan of Care (Insert Text)  STG: Pt will tolerate mechanical soft/honey thick liquids without signs/sx of aspiration with 100% accuracy  STG: Pt will participate with modified barium swallow study x1 (goal met 03/07/15)  STG: Pt will participate with laryngeal exercises x10 with 90% accuracy  LTG: Pt will tolerate the least restrictive diet at discharge without respiratory compromise    SPEECH LANGUAGE PATHOLOGY: BEDSIDE SWALLOW STUDY: Daily Note 5    NAME/AGE/GENDER: Brett Becker is a 54 y.o. male  DATE: 03/13/2015  PRIMARY DIAGNOSIS: Delirium tremens ([place])       ICD-10: Treatment Diagnosis: dysphagia; oropharyngeal R13.12  INTERDISCIPLINARY COLLABORATION: raddiologist  PRECAUTIONS/ALLERGIES: Codeine   ASSESSMENT/PLAN OF CARE:   Pt eating lunch meal of mechanical soft/honey with family present. Pt feeding self. Pt coughing when entered room. No other overt sign/sx of aspiration. Pt cued to remember to double swallow. Recommend diet.   Recommend continue to follow for dysphagia treatment/laryngeal exercises.    Patient will benefit from skilled intervention to address the below impairments.  ????????This section established at most recent assessment??????????  RECOMMENDATIONS AND PLANNED INTERVENTIONS (Benefits and precautions of therapy have been discussed with the patient.):  ?? PO:  Pureed and Liquids:  honey  MEDICATIONS:  ?? with thickened liquid  COMPENSATORY STRATEGIES/MODIFICATIONS INCLUDING:  ?? Effortful swallow  ?? Small sips and bites    OTHER RECOMMENDATIONS (including follow up treatment recommendations):   ?? Tongue based expressions  ?? Family training/education  ?? Laryngeal exercises  ?? Patient education  FREQUENCY/DURATION: Continue to follow patient 3-5x a week to address above goals.   RECOMMENDED REHABILITATION/EQUIPMENT: (at time of discharge pending progress):   Rehab.      SUBJECTIVE:   cooperative   History of Present Injury/Illness: Mr. Benak  has a past medical history of Delirium tremens [facility]) (September, 2014) and Gastric ulcer (September, 2014).  He also  has past surgical history that includes neurological procedure unlisted; back surgery; and endoscopy (September, 2014).  Present Symptoms: cough with po  Pain Intensity 1: 0  Pain Location 1: Back  Pain Orientation 1: Lower  Pain Intervention(s) 1: Repositioned    Current Dietary Status:  Puree/Honey           ?? History of reflux:  no  Social History/Home Situation: home alone  Home Environment: Private residence  One/Two Story Residence: One story  Living Alone: Yes  Support Systems: Friends \\ neighbors  Patient Expects to be Discharged to:: Apartment  Current DME Used/Available at Home: Cane, straight, Environmental consultant, rolling      OBJECTIVE:       Cognitive/Communication Status:  Mental Status  Neurologic State: Alert  Orientation Level: Oriented X4  Cognition: Impaired decision making  Perception: Appears intact  Perseveration: Perseverates during conversation  Safety/Judgement: Fall prevention    Oral Assessment:  Oral Assessment  Labial: Decreased rate, Decreased seal  Dentition: Limited  Lingual: Decreased rate  Mandible: No impairment  Gag Reflex: No impairment       LARYNGEAL / PHARYNGEAL EXERCISES:          Dysphagia Activities: Activities/Procedures listed utilized to improve progress in swallow function. Required maximal cueing to complete laryngeal exercises.    Tool Used: Dysphagia Outcome and Severity Scale (DOSS)     Score Comments   Normal Diet  _0  7 With no strategies or extra time needed        Functional Swallow  _1  6 May have mild oral  or pharyngeal delay        Mild Dysphagia     _0  5 Which may require one diet consistency restricted (those who demonstrate penetration which is entirely cleared on MBS would be included)   Mild-Moderate Dysphagia  _1  4 With 1-2 diet consistencies restricted         Moderate Dysphagia  _2  3 With 2 or more diet consistencies restricted        Moderately Severe Dysphagia  _3  2 With partial PO strategies (trials with ST only)        Severe Dysphagia  _4  1 With inability to tolerate any PO safely           Score:  Initial: 1 Most Recent: 2 (Date: 03/07/15 per MBS )   Interpretation of Tool: The Dysphagia Outcome and Severity Scale (DOSS) is a simple, easy-to-use, 7-point scale developed to systematically rate the functional severity of dysphagia based on objective assessment and make recommendations for diet level, independence level, and type of nutrition.       Score _5 Modifier CH CI CJ CK CL CM CN   ?? Swallowing:              H4765 - CURRENT STATUS:           CN - 100% impaired, limited or restricted              Y6503 - GOAL STATUS:                   CL - 60%-79% impaired, limited or restricted              T4656 - D/C STATUS:                       ---------------To be determined---------------  Payor: ADVICARE MEDICAID / Plan: [place] ADVICARE MEDICAID / Product Type: Managed Care Medicaid /   __________________________________________________________________________________________________  Safety:   After treatment position/precautions:  ?? Upright in Bed  Recommendations for treatment: laryngeal exercises  Total Treatment Duration:  Time In: 1245   Time Out: [place] MA/CCC/SLP

## 2015-03-14 ENCOUNTER — Ambulatory Visit: Payer: Self-pay | Admitting: Nurse Practitioner

## 2015-03-14 LAB — METABOLIC PANEL, BASIC
Anion gap: 5 mmol/L — ABNORMAL LOW (ref 7–16)
BUN: 10 MG/DL (ref 6–23)
CO2: 28 mmol/L (ref 21–32)
Calcium: 7.4 MG/DL — ABNORMAL LOW (ref 8.3–10.4)
Chloride: 105 mmol/L (ref 98–107)
Creatinine: 0.61 MG/DL — ABNORMAL LOW (ref 0.8–1.5)
GFR est AA: >60 mL/min/{1.73_m2} (ref >60)
GFR est non-AA: >60 mL/min/{1.73_m2} (ref >60)
Glucose: 107 mg/dL — ABNORMAL HIGH (ref 65–100)
Potassium: 3.4 mmol/L — ABNORMAL LOW (ref 3.5–5.1)
Sodium: 138 mmol/L (ref 136–145)

## 2015-03-14 LAB — PHOSPHORUS: Phosphorus: 2.5 MG/DL (ref 2.5–4.5)

## 2015-03-14 LAB — MAGNESIUM: Magnesium: 1.7 mg/dL — ABNORMAL LOW (ref 1.8–2.4)

## 2015-03-14 MED FILL — LEVETIRACETAM 500 MG TAB: 500 mg | ORAL | Qty: 1

## 2015-03-14 MED FILL — CHLORDIAZEPOXIDE 10 MG CAP: 10 mg | ORAL | Qty: 1

## 2015-03-14 MED FILL — LOVENOX 40 MG/0.4 ML SUBCUTANEOUS SYRINGE: 40 mg/0.4 mL | SUBCUTANEOUS | Qty: 0.4

## 2015-03-14 MED FILL — NICOTINE 21 MG/24 HR DAILY PATCH: 21 mg/24 hr | TRANSDERMAL | Qty: 1

## 2015-03-14 NOTE — Progress Notes (Signed)
Problem: Self Care Deficits Care Plan (Adult)  Goal: *Acute Goals and Plan of Care (Insert Text)    1. Patient will feed self entire meal with set-up. DISCONTINUE   2. Patient will complete grooming with set-up. GOAL MET  3. Patient will tolerate 15 minutes of OT treatment with no rest breaks to increase activity tolerance for ADLs. GOAL MET  4. Patient will complete functional transfers with minimal assistance and adaptive equipment as needed. CONTINUE  5. Patient will demonstrate fair standing balance to increase safety during standing ADLs. CONTINUE    New goals added 03/13/15:   6. Patient will participate in at least 25 minutes of BUE therapeutic exercises to increase strength for  functional transfers and ADLs.   7. Patient will complete upper body bathing and dressing with set-up.   8. Patient will complete lower body bathing and dressing with minimal assistance.   9. Patient will complete toileting with minimal assistance.     Timeframe: 7 visits      OCCUPATIONAL THERAPY: Daily Note, Treatment Day: 2nd and AM  INPATIENT: Medicaid : Hospital Day: 19    NAME/AGE/GENDER: Brett Becker is a 54 y.o. male  DATE: 03/14/2015  PRIMARY DIAGNOSIS: Delirium tremens ([place])  Delirium tremens ([place])  Delirium tremens ([place])        ICD-10: Treatment Diagnosis:   Unspecified lack of coordination (R27.9)  Dizziness and giddiness (R42)  Muscle weakness (generalized) (M62.81)      INTERDISCIPLINARY COLLABORATION: Physical Therapy Assistant, Certified Occupational Therapy Assistant and Registered Nurse  ASSESSMENT:   [name] was presented in supine sleeping upon arrival. Pt awakened easily and transferred to sitting with minimal assistance. Pt wanted to make a phone call and perseverated on looking through his wallet to find phone numbers and pt was unable to use phone. Pt finally was convinced to put his wallet away and continue to participate in treatment. Pt completed  sit to stand with moderate assistance x's 2 with extremely flexed knees and then transferred from bed to w/c with maximal assistance x's 2. Pt was transferred down to the gym in w/c to participate in group exercises. Pt required visual, verbal, and manual cues to complete exercises with proper body mechanics. Pt was returned to room and positioned up in the recliner chair with posey activated, tab alert in place, and belongings in reach. Pt will continue to benefit from OT to increase progression toward above goals.     ????????This section established at most recent assessment??????????  PROBLEM LIST (Impairments causing functional limitations):  1. Decreased Strength effecting function  2. Decreased ADL/Functional Activities  3. Decreased Transfer Abilities  4. Decreased Ambulation Ability/Technique  5. Decreased Balance  6. Decreased Activity Tolerance  7. Impaired balance affecting function  8. Impaired coordination affecting function  REHABILITATION POTENTIAL FOR STATED GOALS: GUARDED  PLAN OF CARE:  INTERVENTIONS PLANNED: (Benefits and precautions of occupational therapy have been discussed with the patient.)  1. Activities of daily living training  2. Adaptive equipment training  3. Cognitive training  4. Donning&doffing training  5. Group therapy  6. Theraputic activity  7. Theraputic exercise  FREQUENCY/DURATION: Follow patient 1-2 times per day/3-5 days per week until goals are met in order to address above goals.    RECOMMENDED REHABILITATION/EQUIPMENT: (at time of discharge pending progress):   Continue OT.  SUBJECTIVE:   "I have business to take care of ."    History of Present Injury/Illness: Per H&P, "Patient is a 54 y.o. white  male who  presents with altered mental status.  Pt has prior admission by me for DT's in 2014.  He was brought to the ED by a friend due to diminished responsiveness and seizure at home.  No family or friends  present at this time.  Pt able to give limited history due to lethargy.  He denies ever having had a seizure in his life, though he has had documented seizures previously.  Pt states he normally drinks 6-8 beers per day and hadn't been drinking as much today.  He denies chest pain, dyspnea, nausea, vomiting, abdominal pain.  He reports normally having a tremor, but it is worse at the present moment.  He also reports to me that he saw a red ball bouncing around his house today, but knew that it wasn't actually there."  Present Symptoms: confusion    Pain Intensity 1: 0  Prior Level of Function/Home Situation: Patient is a poor historian but reports that he lives with his friend. He says he is independent with ADLs. He uses a bike for communication and does not drive.    Home Environment: Private residence  One/Two Story Residence: One story  Living Alone: Yes  Support Systems: Friends \\ neighbors  Patient Expects to be Discharged to:: Apartment  Current DME Used/Available at Home: Cane, straight, Walker, rolling  OBJECTIVE/TREATMENT:   Therapeutic Activity: (    14 minutes):  Therapeutic activities including Bed transfers, Chair transfers and static/dynamic standing to improve mobility, strength, balance and coordination.  Required moderate/maximal assistance to promote static and dynamic balance in standing.   Group Therapeutic Exercise: (  20 minutes):  Exercises per grid below to improve mobility and strength.  Required minimal visual, verbal and manual cues to promote proper body mechanics.  Progressed range as indicated.    Exercises completed with yellow theraband Date:  03/04/2015 Date:  03/10/2015  With red theraband Date:  03/13/15  (yellow theraband) Date:  03/14/15   Activity/Exercise Parameters Parameters Parameters    Horizontal Shoulder Abd/Adduction 15 reps 15 reps 10 reps BUE 15 reps   Shoulder Flexion 10 reps 10 reps 15 reps BUE 15 reps   Elbow Flexion 15 reps 15 reps 15 reps BUE 15 reps    Punches 10 reps 15 reps 15 reps BUE 15 reps                                                                          [place] AM-PACTM "6 Clicks"                                                       Daily Activity Inpatient Short Form  How much help from another person does the patient currently need... Total A Lot A Little None   1.  Putting on and taking off regular lower body clothing?   '[]'  1   [ X] 2   '[ ]'  3   '[ ]'  4   2.  Bathing (including washing, rinsing, drying)?   '[]'  1   [ X] 2   '[ ]'   3   '[ ]'  4   3.  Toileting, which includes using toilet, bedpan or urinal?   '[]'  1   Valu.Nieves ] 2   '[ ]'  3   '[ ]'  4   4.  Putting on and taking off regular upper body clothing?   '[ ]'  1   '[]'  2   [ X] 3   '[ ]'  4   5.  Taking care of personal grooming such as brushing teeth?   '[ ]'  1   '[]'  2   [ X] 3   '[ ]'  4   6.  Eating meals?   '[ ]'  1   [ 2   [ X] 3   '[ ]'  4   ?? 2007, [employer] of [employer], under license to [employer]. All rights reserved       Score:  Initial: 9 Most Recent: 15 (Date: -- )   Interpretation of Tool:  Represents clinically-significant functional categories (i.e.Activities of daily living).  Score 24 23 22-20 19-14 13-9 8-7 6   Modifier CH CI CJ CK CL CM CN       ?? Self Care:              [phone] - CURRENT STATUS:           CK - 40%-59% impaired, limited or restricted              [id number] - GOAL STATUS:                   CJ - 20%-39% impaired, limited or restricted              U2025 - D/C STATUS:                       ---------------To be determined---------------  Payor: ADVICARE MEDICAID / Plan: [place] / Product Type: Managed Care Medicaid /      Balance  Sitting: Impaired  Sitting - Static: Fair (occasional)  Sitting - Dynamic: Fair (occasional)  Standing: Impaired  Standing - Static: Poor  Standing - Dynamic : Poor       Patient Vitals for the past 6 hrs:   BP BP Patient Position SpO2 O2 Flow Rate (L/min) Pulse   03/14/15 0759 147/82 mmHg At rest 90 % 3 l/min 61    03/14/15 1121 93/67 mmHg Sitting 92 % - 78                           Most Recent Physical Functioning:   Gross Assessment:  AROM: Generally decreased, functional (BUE)  Strength: Generally decreased, functional (BUE)  Coordination: Generally decreased, functional (BUE)               Posture:     Balance:  Sitting: Impaired  Sitting - Static: Fair (occasional)  Sitting - Dynamic: Fair (occasional)  Standing: Impaired  Standing - Static: Poor  Standing - Dynamic : Poor  Bed Mobility:  Supine to Sit: Minimum assistance  Scooting: Minimum assistance  Wheelchair Mobility:     Transfers:  Sit to Stand: Moderate assistance;Assist x2  Stand to Sit: Moderate assistance;Assist x2  Bed to Chair: Maximum assistance;Assist x2     Mental Status  Neurologic State: Alert  Orientation Level: Oriented X4 (periodic confusion)  Cognition: Decreased attention/concentration;Follows commands;Impaired decision making;Impulsive;Poor safety awareness      Basic ADLs (From Assessment) Complex ADLs (From  Assessment)   Basic ADL  Feeding: Minimum assistance  Oral Facial Hygiene/Grooming: Minimum assistance  Bathing: Moderate assistance  Upper Body Dressing: Minimum assistance  Lower Body Dressing: Moderate assistance  Toileting: Moderate assistance Instrumental ADL  Meal Preparation: Total assistance  Homemaking: Total assistance  Medication Management: Total assistance  Financial Management: Total assistance   Grooming/Bathing/Dressing Activities of Daily Living                             Bed/Mat Mobility  Supine to Sit: Minimum assistance  Sit to Stand: Moderate assistance;Assist x2  Bed to Chair: Maximum assistance;Assist x2  Scooting: Minimum assistance          Braces/Orthotics/Lines/Other:   ?? Hand Dominance: unknown  ?? IV  ?? O2 Device: Nasal cannula    Safety:   After treatment position/precautions:  ?? Up in chair, Bed alarm/tab alert on, Bed/Chair-wheels locked, Call light within reach and RN notified     Progression/Medical Necessity:   ?? Patient demonstrates fair rehab potential due to higher previous functional level.  Compliance with Program/Exercises: compliant some of the time.    Reason for Continuation of Services/Other Comments:  ?? Patient continues to require present interventions due to patient's inability to care for self without assistance.  Recommendations/Intent for next treatment session: Treatment next visit will focus on advancements to more challenging activities and reduction in assistance provided.  Total Treatment Duration:  Time In: 1020  Time Out: 1055  [place], [place]

## 2015-03-14 NOTE — Progress Notes (Signed)
Hospitalist Progress Note    03/14/2015  Admit Date: 02/24/2015 10:47 PM   NAME: Brett Becker   DOB:  12/12/1961   MRN:  [id number]   Attending: Caffie Pinto, DO  PCP:  Phys Other, MD    SUBJECTIVE:   Patient 54 y.o. white?? male who presented with altered mental status on 02/25/2015.?? He has?? Had a prior admission for DT's in 2014.?? He was brought to the ED by a friend due to diminished responsiveness and seizure at home.??The Pt stateed that he normally drinks 6-8 beers per day and hadn't been drinking as much on the day of presentation.?? He also reported that he saw a red ball bouncing around his house today, but knew that it wasn't actually there. He was initially admitted to the ICU for DT's, alchol abuse and hepatitis C. He was at some point started on Keppra for seizure activity. He was assessed by speech & swallow and deemed to be an extreme aspiration risk. He was kept NPO and started on TPN.?? 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 MRI showed nothing acute. He continued to aspirate with feeds. As his mental status improved he became very upset by not being able to eat. He was re-assessed for his swallowing ability. He failed. He was re-assessed a few days later and though not a full pass he had improved. We decided to try him on a trial of po intake and he did well and has continued to improve with his po intake. He is unable to care for himself at home. He has had periods of agitation & confusion. H He is awaiting placement.     4/1 - no acute complaints.      Review of Systems negative with exception of pertinent positives noted above  PHYSICAL EXAM   BP 93/67 mmHg   Pulse 78   Temp(Src) 98.9 ??F (37.2 ??C)   Resp 16   Ht  (1.727 m)   Wt 51.937 kg (114 lb 8 oz)   BMI 17.41 kg/m2   SpO2 92%   Temp (24hrs), Avg:98.4 ??F (36.9 ??C), Min:98 ??F (36.7 ??C), Max:98.9 ??F (37.2 ??C)    Oxygen Therapy  O2 Sat (%): 92 % (03/14/15 1121)   Pulse via Oximetry: 60 beats per minute (03/14/15 0548)  O2 Device: Nasal cannula (03/14/15 0759)  O2 Flow Rate (L/min): 3 l/min (03/14/15 0759)    Intake/Output Summary (Last 24 hours) at 03/14/15 1248  Last data filed at 03/13/15 1813   Gross per 24 hour   Intake    240 ml   Output      0 ml   Net    240 ml      General: No acute distress????  Lungs:  CTA Bilaterally.   Heart:  Regular rate and rhythm,?? No murmur, rub, or gallop  Abdomen: Soft, Non distended, Non tender, Positive bowel sounds  Extremities: No cyanosis, clubbing or edema  Neurologic:?? No focal deficits    ASSESSMENT      Active Hospital Problems    Diagnosis Date Noted   ??? 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 Hyponatremia 02/25/2015   ??? Hepatitis C 08/31/2013   ??? HTN (hypertension) 08/30/2013   ??? Alcohol abuse 08/28/2013     A/P  - DT's - continued scheduled librium, prn meds. Continue keprra   - Hep C - aware  - HTn - controlled  - debility - pending  rehab      DVT Prophylaxis: lovenox sq    Thera FlakeAllison C Jovaughn Wojtaszek, DO

## 2015-03-14 NOTE — Progress Notes (Signed)
CLTC returned the call and state that their 'communications is down at the office' so she could not complete patients level of care but says that she left this floors SW a message for what they need on Monday.

## 2015-03-14 NOTE — Progress Notes (Signed)
Problem: Mobility Impaired Adult Goals   UPDATED: 03/11/15  STG:  (1.)Mr. Quast will move from supine to sit and sit to supine , scoot up and down and roll side to side with CONTACT GUARD ASSIST within 3 day(s).    (2.)Mr. Graveline will transfer from bed to chair and chair to bed with MINIMAL ASSIST using the least restrictive device within 3 day(s).    (3.)Mr. Tapp will ambulate with MINIMAL ASSIST for 25+ feet with the least restrictive device within 3 day(s).   (4.)Mr. Frett will tolerate 25+ minutes of therapeutic activity/exercise or neuromuscular re-education while maintaining stable vitals to improve functional strength and coordination within 3 day(s).  (5.)Mr. Weigold will demonstrate fair dynamic standing balance within 7 day(s).    LTG:  (1.)Mr. Gilreath will move from supine to sit and sit to supine , scoot up and down and roll side to side in bed with INDEPENDENT within 7 day(s).    (2.)[name] will transfer from bed to chair and chair to bed with CONTACT GUARD ASSIST using the least restrictive device within 7 day(s).    (3.)Mr. Riel will ambulate with MINIMAL ASSIST for 50+ feet with the least restrictive device within 7 day(s).  (4.)Mr. Chaney will demonstrate good dynamic standing balance within 7 day(s).    ________________________________________________________________________________________________  PHYSICAL THERAPY: Daily Note, Treatment Day: 2nd and AM  INPATIENT: Medicaid : Hospital Day: 19    NAME/AGE/GENDER: Saburo Luger is a 54 y.o. male  DATE: 03/14/2015  PRIMARY DIAGNOSIS: Delirium tremens ([place])  Delirium tremens ([place])  Delirium tremens ([place])        ICD-10: Treatment Diagnosis: Unsteadiness on feet; Unspecified lack of coordination   INTERDISCIPLINARY COLLABORATION: Physical Therapy Assistant and Registered Nurse  ASSESSMENT:   Mr. Alarie is a 54 year old male admitted with delirium tremens, AMS, and suspected seizure activity.  Patient was brought down to therapy gym this  morning to participate in group therapeutic exercises to improve functional strength for transfers and gait training. Patient requires cues to perform exercises correctly. Patient was returned to his room by [place]. Therapist returned to patient's room after lunch but patient was supine in bed with family at bedside. Unable to perform further treatment with patient. Will continue efforts.      ????????This section established at most recent assessment??????????  PROBLEM LIST (Impairments causing functional limitations):  1. Decreased Strength effecting function  2. Decreased ADL/Functional Activities  3. Decreased Transfer Abilities  4. Decreased Ambulation Ability/Technique  5. Decreased Balance  6. Decreased Activity Tolerance  7. Increased Fatigue effecting function  8. Increased Shortness of Breath affecting function  9. Decreased Knowledge of precautions  10. Decreased Independence with Home Exercise Program  REHABILITATION POTENTIAL FOR STATED GOALS: GOOD      PLAN OF CARE:   INTERVENTIONS PLANNED: (Benefits and precautions of physical therapy have been discussed with the patient.)  1. balance exercise  2. bed mobility  3. family education  4. gait training  5. neuromuscular re-education/strengthening  6. range of motion: active/assisted/passive  7. therapeutic activities  8. therapeutic exercise/strengthening  9. transfer training  FREQUENCY/DURATION: Follow patient 1-2 times per day/4-7 days per week until goals are met in order to address above goals.    RECOMMENDED REHABILITATION/EQUIPMENT: (at time of discharge pending progress):   Rehab.  SUBJECTIVE:   "I want to stay here"    Present Symptoms: endorses "no" pain.         History of Present Injury/Illness: Per chart:  Patient  is a 54 y.o. white  male who presents with altered mental status.  Pt has prior admission by me for DT's in 2014.  He was brought to the ED by a friend due to diminished responsiveness and seizure at home.  No  family or friends present at this time.  Pt able to give limited history due to lethargy.  He denies ever having had a seizure in his life, though he has had documented seizures previously.  Pt states he normally drinks 6-8 beers per day and hadn't been drinking as much today.  He denies chest pain, dyspnea, nausea, vomiting, abdominal pain.  He reports normally having a tremor, but it is worse at the present moment.  He also reports to me that he saw a red ball bouncing around his house today, but knew that it wasn't actually there.      Past Medical History    Diagnosis  Date    ???  Delirium tremens ([place])  September, 2014        Admitted with DTs and seizure activity.  Found to have GI bleeding secondary to gastric ullcers.    ???  Gastric ulcer  September, 2014        Found at EGD to have major gastric ulcers.          Past Surgical History    Procedure  Laterality  Date    ???  Pr neurological procedure unlisted        ???  Hx back surgery            x 2    ???  Hx endoscopy    September, 2014              Prior Level of Function/Home Situation:   History provided by patient/chart; patient is a poor historian at time of initial assessment.  Patient live alone in a private residence/apartment.  At baseline, independent with ADLs and ambulates with a straight cane.  Home Environment: Private residence  One/Two Story Residence: One story  Living Alone: Yes  Support Systems: Friends \\ neighbors  Patient Expects to be Discharged to:: Apartment  Current DME Used/Available at Home: Cane, straight, Environmental consultant, rolling  OBJECTIVE/TREATMENT:   (In addition to Assessment/Re-Assessment sessions the following treatments were rendered)                                      [facility]??? ???6 Clicks???                                          Basic Mobility Inpatient Short Form  How much difficulty does the patient currently have... Unable A Lot A Little None   1.  Turning over in bed (including adjusting bedclothes, sheets and  blankets)?   '[ ]'  1   '[ ]'  2   '[]'  3   [ X] 4   2.  Sitting down on and standing up from a chair with arms ( e.g., wheelchair, bedside commode, etc.)   '[ ]'  1   '[]'  2   Valu.Nieves ] 3   '[ ]'  4   3.  Moving from lying on back to sitting on the side of the bed?   '[ ]'  1   '[ ]'  2   '[X]'  3   '[ ]'   4               How much help from another person does the patient currently need... Total A Lot A Little None   4.  Moving to and from a bed to a chair (including a wheelchair)?   '[ ]'  1   '[X]'  2   '[ ]'  3   '[ ]'  4   5.  Need to walk in hospital room?   '[ ]'  1   '[X]'  2   '[ ]'  3   '[ ]'  4   6.  Climbing 3-5 steps with a railing?   '[ ]'  1   '[X]'  2   '[ ]'  3   '[ ]'  4   ?? 2007, [employer] [place], under license to [employer]. All rights reserved       Score:  Initial: 14 Most Recent: 16 (Date: 03/10/14 )   Interpretation of Tool:  Represents activities that are increasingly more difficult (i.e. Bed mobility, Transfers, Gait).  Score 24 23 22-20 19-15 14-10 9-7 6   Modifier CH CI CJ CK CL CM CN       ?? Mobility - Walking and Moving Around:              [id number] - CURRENT STATUS:       CK - 40%-59% impaired, limited or restricted              G8979 - GOAL STATUS:               CJ - 20%-39% impaired, limited or restricted              X5170 - D/C STATUS:                    ---------------To be determined---------------  Payor: ADVICARE MEDICAID / Plan: [place]IC[place] / Product Type: Managed Care Medicaid /    Most Recent Physical Functioning:   Gross Assessment:                  Posture:  Posture (WDL): Exceptions to WDL  Posture Assessment: Forward head, Rounded shoulders  Balance:  Sitting: Impaired  Sitting - Static: Fair (occasional)  Sitting - Dynamic: Fair (occasional)  Standing: Impaired  Standing - Static: Poor  Standing - Dynamic : Poor  Bed Mobility:  Supine to Sit: Minimum assistance  Scooting: Minimum assistance  Wheelchair Mobility:     Transfers:  Sit to Stand: Moderate assistance;Assist x2  Stand to Sit: Moderate assistance;Assist x2   Bed to Chair: Maximum assistance;Assist x2  Gait:              Gait Training (   minutes):  Gait training to improve and/or restore physical functioning as related to mobility, strength and balance.  Ambulated   with   using a   and moderate   related to their stance phase, stride length, heel strike, ankle position and motion, hip position and motion and guidance, safety and sequencing to promote proper body alignment, promote proper body posture and promote proper body mechanics.      Group Therapeutic Exercise: (15 Minutes):  Exercises per grid below to improve mobility, strength, balance and coordination.  Required minimal visual, verbal and tactile cues to promote proper body alignment, promote proper body posture and promote proper body mechanics.     Date:  03/10/15 Date:  03/11/15 Date:  03/13/15 Date:  03/14/15   Activity/Exercise Parameters Parameters Group  exs - AM Group exercises   Seated hip flexion x25B 2x20B x20B x20B A   Seated hip ab/adductuon x20B x20B x20B x20B A   LAQ x20B 2x20B x20B 2x20B A   Ankle plantarflexion x25B x25B x20B 2x20B A   Ankle dorsiflexion x25B x25B x20B 2x20B A     Braces/Orthotics/Lines/Etc:   ?? None  Safety:   After treatment position/precautions:  ?? COTA to return patient back to room  Progression/Medical Necessity:   ?? Patient demonstrates good rehab potential due to higher previous functional level.  Compliance with Program/Exercises: Will assess as treatment progresses.   Reason for Continuation of Services/Other Comments:  ?? Patient continues to require skilled intervention due to decreased functional mobility, coordination, and balance/gait status from baseline..  Recommendations/Intent for next treatment session: Treatment next visit will focus on advancements to more challenging activities and reduction in assistance provided.    Total Treatment Duration:  Time In: 8588  Time Out: [place]. [place], PTA

## 2015-03-14 NOTE — Progress Notes (Signed)
END OF SHIFT NOTE:    INTAKE/OUTPUT  03/31 0701 - 04/01 0700  In: 360 [P.O.:360]  Out: -   Voiding: YES  Catheter: NO  Color: clear  Drain:              DIET  mech soft honey thick    Flatus: Patient does have flatus present.    Stool:  1 occurrences.    Characteristics:  Stool Assessment  Stool Color: Fristoe  Stool Appearance: Formed  Stool Amount: Large  Stool Source/Status: Rectum    Ambulating  NO    Emesis: 0 occurrences.    Characteristics:          VITAL SIGNS  Patient Vitals for the past 12 hrs:   Temp Pulse Resp BP SpO2   03/14/15 1547 98.2 ??F (36.8 ??C) 73 16 123/80 mmHg 98 %   03/14/15 1121 98.9 ??F (37.2 ??C) 78 16 93/67 mmHg 92 %   03/14/15 0759 98.1 ??F (36.7 ??C) 61 16 147/82 mmHg 90 %       Pain Assessment  Pain Intensity 1: 0 (03/14/15 1020)  Pain Location 1: Generalized  Pain Intervention(s) 1:  (declined intervention)  Patient Stated Pain Goal: 0    Neurological ROS: no TIA or stroke symptoms        MEGAN Ivory BroadM KELLY, RN

## 2015-03-14 NOTE — Progress Notes (Signed)
Problem: Dysphagia (Adult)  Goal: *Acute Goals and Plan of Care (Insert Text)  STG: Pt will tolerate mechanical soft/honey thick liquids without signs/sx of aspiration with 100% accuracy  STG: Pt will participate with modified barium swallow study x1 (goal met 03/07/15)  STG: Pt will participate with laryngeal exercises x10 with 90% accuracy  LTG: Pt will tolerate the least restrictive diet at discharge without respiratory compromise    SPEECH LANGUAGE PATHOLOGY: BEDSIDE SWALLOW STUDY: Daily Note: 6    NAME/AGE/GENDER: Brett Becker is a 54 y.o. male  DATE: 03/14/2015  PRIMARY DIAGNOSIS: Delirium tremens ([place])       ICD-10: Treatment Diagnosis: dysphagia; oropharyngeal R13.12  INTERDISCIPLINARY COLLABORATION: raddiologist  PRECAUTIONS/ALLERGIES: Codeine   ASSESSMENT/PLAN OF CARE:   Pt seen for dysphagia therapy.  Pt wanted to make some phone calls and wanted his wallet.  He agreed to participate with SLP first.  However, after completing one set of the [place] he began yelling at SLP and stating, "lady I've got things to do".  He reported he wants to leave to go to the bank.  He then stated, "I'm not trying to be rude and you can come back and knock on my door at any time but you came at the wrong time".  Session ended as pt wanted to take care of some phone calls.  Difficulty with complete elevation.      Patient will benefit from skilled intervention to address the below impairments.  ????????This section established at most recent assessment??????????  RECOMMENDATIONS AND PLANNED INTERVENTIONS (Benefits and precautions of therapy have been discussed with the patient.):  ?? PO:  Mechanical soft with chopped meat and vegetables and Liquids:  honey  MEDICATIONS:  ?? with thickened liquid  COMPENSATORY STRATEGIES/MODIFICATIONS INCLUDING:  ?? Effortful swallow  ?? Small sips and bites  ?? No straws    OTHER RECOMMENDATIONS (including follow up treatment recommendations):   ?? Tongue based expressions  ?? Family training/education   ?? Laryngeal exercises  ?? Patient education  FREQUENCY/DURATION: Continue to follow patient 3-5x a week to address above goals.   RECOMMENDED REHABILITATION/EQUIPMENT: (at time of discharge pending progress):   Rehab.      SUBJECTIVE:   Pt required encouragement for po.    History of Present Injury/Illness: Brett Becker  has a past medical history of Delirium tremens [facility]) (September, 2014) and Gastric ulcer (September, 2014).  He also  has past surgical history that includes neurological procedure unlisted; back surgery; and endoscopy (September, 2014).  Present Symptoms: cough with po  Pain Intensity 1: 7  Pain Location 1: Generalized  Pain Orientation 1: Lower  Pain Intervention(s) 1:  (declined intervention)    Current Dietary Status:  mech soft/Honey/no mixed consistencies           ?? History of reflux:  no  Social History/Home Situation: home alone  Home Environment: Private residence  One/Two Story Residence: One story  Living Alone: Yes  Support Systems: Friends \\ neighbors  Patient Expects to be Discharged to:: Apartment  Current DME Used/Available at Home: Cane, straight, Environmental consultant, rolling      OBJECTIVE:       Cognitive/Communication Status:  Mental Status  Neurologic State: Alert       LARYNGEAL / PHARYNGEAL EXERCISES:  Mendelsohn Maneuver: Yes  Reps : 10 (70%)  Sets : 1                                                                         Dysphagia Activities: Activities/Procedures listed utilized to improve progress in swallow function. Required maximal cueing to complete laryngeal exercises.    Tool Used: Dysphagia Outcome and Severity Scale (DOSS)     Score Comments   Normal Diet  '[ ]'  7 With no strategies or extra time needed        Functional Swallow  '[ ]'  6 May have mild oral or pharyngeal delay        Mild Dysphagia     '[ ]'  5 Which may require one diet consistency restricted (those who  demonstrate penetration which is entirely cleared on MBS would be included)   Mild-Moderate Dysphagia  '[ ]'  4 With 1-2 diet consistencies restricted        Moderate Dysphagia  '[ ]'  3 With 2 or more diet consistencies restricted        Moderately Severe Dysphagia  '[ ]'  2 With partial PO strategies (trials with ST only)        Severe Dysphagia  '[X]'  1 With inability to tolerate any PO safely           Score:  Initial: 1 Most Recent: 2 (Date: 03/07/15 per MBS )   Interpretation of Tool: The Dysphagia Outcome and Severity Scale (DOSS) is a simple, easy-to-use, 7-point scale developed to systematically rate the functional severity of dysphagia based on objective assessment and make recommendations for diet level, independence level, and type of nutrition.       Score '7 6 5 4 3 2 1   ' Modifier CH CI CJ CK CL CM CN   ?? Swallowing:              X5400 - CURRENT STATUS:           CN - 100% impaired, limited or restricted              Q6761 - GOAL STATUS:                   CL - 60%-79% impaired, limited or restricted              P5093 - D/C STATUS:                       ---------------To be determined---------------  Payor: ADVICARE MEDICAID / Plan: [place] ADVICARE MEDICAID / Product Type: Managed Care Medicaid /   __________________________________________________________________________________________________  Safety:   After treatment position/precautions:  ?? Upright in Bed  Recommendations for treatment: laryngeal exercises  Total Treatment Duration:  Time In: 0911   Time Out: [place], MSP, CCC-SLP

## 2015-03-14 NOTE — Progress Notes (Signed)
Called [facility] RN Timmothy Soursatricia Helms ref patients  Level of care and left her a message to return call for updates.  [facility] has received approval from patient's insurance for transfer BUT need [facility].

## 2015-03-15 LAB — METABOLIC PANEL, BASIC
Anion gap: 8 mmol/L (ref 7–16)
BUN: 9 MG/DL (ref 6–23)
CO2: 25 mmol/L (ref 21–32)
Calcium: 7.5 MG/DL — ABNORMAL LOW (ref 8.3–10.4)
Chloride: 104 mmol/L (ref 98–107)
Creatinine: 0.55 MG/DL — ABNORMAL LOW (ref 0.8–1.5)
GFR est AA: >60 mL/min/{1.73_m2} (ref >60)
GFR est non-AA: >60 mL/min/{1.73_m2} (ref >60)
Glucose: 101 mg/dL — ABNORMAL HIGH (ref 65–100)
Potassium: 3.4 mmol/L — ABNORMAL LOW (ref 3.5–5.1)
Sodium: 137 mmol/L (ref 136–145)

## 2015-03-15 MED FILL — CHLORDIAZEPOXIDE 10 MG CAP: 10 mg | ORAL | Qty: 1

## 2015-03-15 MED FILL — LEVETIRACETAM 500 MG TAB: 500 mg | ORAL | Qty: 1

## 2015-03-15 MED FILL — TRANSDERM-SCOP 1 MG OVER 3 DAYS TRANSDERMAL PATCH: 1 mg over 3 days | TRANSDERMAL | Qty: 1

## 2015-03-15 MED FILL — NICOTINE 21 MG/24 HR DAILY PATCH: 21 mg/24 hr | TRANSDERMAL | Qty: 1

## 2015-03-15 MED FILL — LOVENOX 40 MG/0.4 ML SUBCUTANEOUS SYRINGE: 40 mg/0.4 mL | SUBCUTANEOUS | Qty: 0.4

## 2015-03-15 NOTE — Progress Notes (Signed)
Hospitalist Progress Note    03/15/2015  Admit Date: 02/24/2015 10:47 PM   NAME: Brett Becker   DOB:  Jan 28, 1961   MRN:  [id number]   Attending: Caffie PintoAlexander M Drew, DO  PCP:  Phys Other, MD    SUBJECTIVE:   Patient 54 y.o. white?? male who presented with altered mental status on 02/25/2015.?? He has?? Had a prior admission for DT's in 2014.?? He was brought to the ED by a friend due to diminished responsiveness and seizure at home.??The Pt stateed that he normally drinks 6-8 beers per day and hadn't been drinking as much on the day of presentation.?? He also reported that he saw a red ball bouncing around his house today, but knew that it wasn't actually there. He was initially admitted to the ICU for DT's, alchol abuse and hepatitis C. He was at some point started on Keppra for seizure activity. He was assessed by speech & swallow and deemed to be an extreme aspiration risk. He was kept NPO and started on TPN.?? 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 MRI showed nothing acute. He continued to aspirate with feeds. As his mental status improved he became very upset by not being able to eat. He was re-assessed for his swallowing ability. He failed. He was re-assessed a few days later and though not a full pass he had improved. We decided to try him on a trial of po intake and he did well and has continued to improve with his po intake. He is unable to care for himself at home. He has had periods of agitation & confusion. H He is awaiting placement.     4/2- "im going to die of boredom". No o/n events    Review of Systems negative with exception of pertinent positives noted above  PHYSICAL EXAM   BP 103/69 mmHg   Pulse 75   Temp(Src) 98.8 ??F (37.1 ??C)   Resp 18   Ht 5\' 8"  (1.727 m)   Wt 51.937 kg (114 lb 8 oz)   BMI 17.41 kg/m2   SpO2 92%   Temp (24hrs), Avg:98.4 ??F (36.9 ??C), Min:98.1 ??F (36.7 ??C), Max:98.8 ??F (37.1 ??C)    Oxygen Therapy  O2 Sat (%): 92 % (03/15/15 1430)   Pulse via Oximetry: 60 beats per minute (03/14/15 0548)  O2 Device: Room air (03/14/15 1547)  O2 Flow Rate (L/min): 3 l/min (03/14/15 0759)  No intake or output data in the 24 hours ending 03/15/15 1527   General: No acute distress????  Lungs:  CTA Bilaterally.   Heart:  Regular rate and rhythm,?? No murmur, rub, or gallop  Abdomen: Soft, Non distended, Non tender, Positive bowel sounds  Extremities: No cyanosis, clubbing or edema  Neurologic:?? No focal deficits    ASSESSMENT      Active Hospital Problems    Diagnosis Date Noted   ??? 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 Hyponatremia 02/25/2015   ??? Hepatitis C 08/31/2013   ??? HTN (hypertension) 08/30/2013   ??? Alcohol abuse 08/28/2013     A/P  - DT's - continued scheduled librium, prn meds. Continue keprra   - Hep C - aware  - HTn - controlled  - debility - pending rehab      DVT Prophylaxis: lovenox sq    Thera FlakeAllison C Davius Goudeau, DO

## 2015-03-16 LAB — METABOLIC PANEL, BASIC
Anion gap: 6 mmol/L — ABNORMAL LOW (ref 7–16)
BUN: 9 MG/DL (ref 6–23)
CO2: 26 mmol/L (ref 21–32)
Calcium: 7.2 MG/DL — ABNORMAL LOW (ref 8.3–10.4)
Chloride: 104 mmol/L (ref 98–107)
Creatinine: 0.64 MG/DL — ABNORMAL LOW (ref 0.8–1.5)
GFR est AA: >60 mL/min/{1.73_m2} (ref >60)
GFR est non-AA: >60 mL/min/{1.73_m2} (ref >60)
Glucose: 106 mg/dL — ABNORMAL HIGH (ref 65–100)
Potassium: 3.3 mmol/L — ABNORMAL LOW (ref 3.5–5.1)
Sodium: 136 mmol/L (ref 136–145)

## 2015-03-16 MED ORDER — POTASSIUM CHLORIDE 10 % ORAL LIQUID
20 mEq/15 mL | Freq: Every day | ORAL | Status: DC
Start: 2015-03-16 — End: 2015-03-17
  Administered 2015-03-16 – 2015-03-17 (×2): via ORAL

## 2015-03-16 MED FILL — CHLORDIAZEPOXIDE 10 MG CAP: 10 mg | ORAL | Qty: 1

## 2015-03-16 MED FILL — LOVENOX 40 MG/0.4 ML SUBCUTANEOUS SYRINGE: 40 mg/0.4 mL | SUBCUTANEOUS | Qty: 0.4

## 2015-03-16 MED FILL — LEVETIRACETAM 500 MG TAB: 500 mg | ORAL | Qty: 1

## 2015-03-16 MED FILL — POTASSIUM CHLORIDE 10 % ORAL LIQUID: 20 mEq/15 mL | ORAL | Qty: 15

## 2015-03-16 MED FILL — NICOTINE 21 MG/24 HR DAILY PATCH: 21 mg/24 hr | TRANSDERMAL | Qty: 1

## 2015-03-16 NOTE — Progress Notes (Signed)
Problem: Mobility Impaired Adult Goals   UPDATED: 03/11/15  STG:  (1.)Mr. Temme will move from supine to sit and sit to supine , scoot up and down and roll side to side with CONTACT GUARD ASSIST within 3 day(s).    (2.)Mr. Luviano will transfer from bed to chair and chair to bed with MINIMAL ASSIST using the least restrictive device within 3 day(s).    (3.)Mr. Sayres will ambulate with MINIMAL ASSIST for 25+ feet with the least restrictive device within 3 day(s).   (4.)Mr. Gladu will tolerate 25+ minutes of therapeutic activity/exercise or neuromuscular re-education while maintaining stable vitals to improve functional strength and coordination within 3 day(s).  (5.)Mr. Hockey will demonstrate fair dynamic standing balance within 7 day(s).    LTG:  (1.)Mr. Damron will move from supine to sit and sit to supine , scoot up and down and roll side to side in bed with INDEPENDENT within 7 day(s).    (2.)Mr. Aman will transfer from bed to chair and chair to bed with CONTACT GUARD ASSIST using the least restrictive device within 7 day(s).    (3.)Mr. Krupinski will ambulate with MINIMAL ASSIST for 50+ feet with the least restrictive device within 7 day(s).  (4.)Mr. Campanelli will demonstrate good dynamic standing balance within 7 day(s).    ________________________________________________________________________________________________  PHYSICAL THERAPY: Daily Note, Treatment Day: 2nd and AM  INPATIENT: Medicaid : Hospital Day: 21    NAME/AGE/GENDER: Narayan Scull is a 54 y.o. 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 Broaddus is a 54 year old male admitted with delirium tremens, AMS, and  suspected seizure activity.  Pt finishing breakfast upon arrival to room. He is agreeable to PT session. He performed bed mobility with min A. He stood with min A to min A x2 with RW. He needs a lot of verbal cues for posture. He has a strong backward lean as he remains flexed through the trunk, knees, and elbows. When given verbal cues, he will correct fairly well for several seconds. He also needs cueing to keep his head up and look forward. He continues to be at high risk for falls. He did ambulate three separate times, which seems to be an improvement. Will continue with POC      ????????This section established at most recent assessment??????????  PROBLEM LIST (Impairments causing functional limitations):  1. Decreased Strength effecting function  2. Decreased ADL/Functional Activities  3. Decreased Transfer Abilities  4. Decreased Ambulation Ability/Technique  5. Decreased Balance  6. Decreased Activity Tolerance  7. Increased Fatigue effecting function  8. Increased Shortness of Breath affecting function  9. Decreased Knowledge of precautions  10. Decreased Independence with Home Exercise Program  REHABILITATION POTENTIAL FOR STATED GOALS: GOOD      PLAN OF CARE:   INTERVENTIONS PLANNED: (Benefits and precautions of physical therapy have been discussed with the patient.)  1. balance exercise  2. bed mobility  3. family education  4. gait training  5. neuromuscular re-education/strengthening  6. range of motion: active/assisted/passive  7. therapeutic activities  8. therapeutic exercise/strengthening  9. transfer training  FREQUENCY/DURATION: Follow patient 1-2 times per day/4-7 days per week until goals are met in order to address above goals.    RECOMMENDED REHABILITATION/EQUIPMENT: (  at time of discharge pending progress):   Rehab.  SUBJECTIVE:   "i need my hat"    Present Symptoms: endorses "no" pain.   Pain Intensity 1: 0     History of Present Injury/Illness: Per chart:   Patient is a 54 y.o. white  male who presents with altered mental status.  Pt has prior admission by me for DT's in 2014.  He was brought to the ED by a friend due to diminished responsiveness and seizure at home.  No family or friends present at this time.  Pt able to give limited history due to lethargy.  He denies ever having had a seizure in his life, though he has had documented seizures previously.  Pt states he normally drinks 6-8 beers per day and hadn't been drinking as much today.  He denies chest pain, dyspnea, nausea, vomiting, abdominal pain.  He reports normally having a tremor, but it is worse at the present moment.  He also reports to me that he saw a red ball bouncing around his house today, but knew that it wasn't actually there.      Past Medical History    Diagnosis  Date    ???  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  Found to have GI bleeding secondary to gastric ullcers.    ???  Gastric ulcer  September, 2014        Found at EGD to have major gastric ulcers.          Past Surgical History    Procedure  Laterality  Date    ???  Pr neurological procedure unlisted        ???  Hx back surgery            x 2    ???  Hx endoscopy    September, 2014              Prior Level of Function/Home Situation:   History provided by patient/chart; patient is a poor historian at time of initial assessment.  Patient live alone in a private residence/apartment.  At baseline, independent with ADLs and ambulates with a straight cane.  Home Environment: Private residence  One/Two Story Residence: One story  Living Alone: Yes  Support Systems: Friends \\ neighbors  Patient Expects to be Discharged to:: Apartment  Current DME Used/Available at Home: Cane, straight, Environmental consultant, rolling  OBJECTIVE/TREATMENT:   (In addition to Assessment/Re-Assessment sessions the following treatments were rendered)                                      [facility]??? ???6 Clicks???                                           Basic Mobility Inpatient Short Form  How much difficulty does the patient currently have... Unable A Lot A Little None   1.  Turning over in bed (including adjusting bedclothes, sheets and blankets)?   '[ ]'  1   '[ ]'  2   '[]'  3   [ X] 4   2.  Sitting down on and standing up from a chair with arms ( e.g., wheelchair, bedside commode, etc.)   '[ ]'  1   '[]'   2   Valu.Nieves ] 3   '[ ]'  4   3.  Moving from lying on back to sitting on the side of the bed?   '[ ]'  1   '[ ]'  2   '[X]'  3   '[ ]'  4               How much help from another person does the patient currently need... Total A Lot A Little None   4.  Moving to and from a bed to a chair (including a wheelchair)?   '[ ]'  1   '[X]'  2   '[ ]'  3   '[ ]'  4   5.  Need to walk in hospital room?   '[ ]'  1   '[X]'  2   '[ ]'  3   '[ ]'  4   6.  Climbing 3-5 steps with a railing?   '[ ]'  1   '[X]'  2   '[ ]'  3   '[ ]'  4   ?? 2007, [employer], under license to [employer]. All rights reserved       Score:  Initial: 14 Most Recent: 16 (Date: 03/10/14 )   Interpretation of Tool:  Represents activities that are increasingly more difficult (i.e. Bed mobility, Transfers, Gait).  Score 24 23 22-20 19-15 14-10 9-7 6   Modifier CH CI CJ CK CL CM CN       ?? Mobility - Walking and Moving Around:              Q7341 - CURRENT STATUS:       CK - 40%-59% impaired, limited or restricted              G8979 - GOAL STATUS:               CJ - 20%-39% impaired, limited or restricted              P3790 - D/C STATUS:                    ---------------To be determined---------------  Payor: ADVICARE MEDICAID / Plan: [place] ADVICARE [place]ICA[place] / Product Type: Managed Care Medicaid /    Most Recent Physical Functioning:   Gross Assessment:                  Posture:  Posture (WDL): Exceptions to WDL  Posture Assessment: Forward head, Rounded shoulders  Balance:  Sitting: Impaired  Sitting - Static: Fair (occasional)  Sitting - Dynamic: Fair (occasional)  Standing: Impaired  Standing - Static: Poor   Standing - Dynamic : Poor  Bed Mobility:  Supine to Sit: Minimum assistance  Scooting: Minimum assistance  Interventions: Verbal cues  Wheelchair Mobility:     Transfers:  Sit to Stand: Minimum assistance;Assist x2  Stand to Sit: Minimum assistance  Bed to Chair: Minimum assistance;Assist x2  Interventions: Verbal cues;Safety awareness training  Gait:     Base of Support: Narrowed;Center of gravity altered  Speed/Cadence: Pace decreased (<100 feet/min);Shuffled  Step Length: Right shortened;Left shortened  Gait Abnormalities: Decreased step clearance;Shuffling gait;Step to gait;Trunk sway increased;Path deviations  Distance (ft): 8 Feet (ft)  Assistive Device: Walker, rolling  Ambulation - Level of Assistance: Minimal assistance;Assist x2;Moderate assistance  Interventions: Safety awareness training;Tactile cues;Verbal cues        Gait Training ( 15  minutes):  Gait training to improve and/or restore physical functioning as related to mobility, strength and balance.  Ambulated 8 Feet (ft)  with Minimal assistance;Assist x2;Moderate assistance using a Walker, rolling and moderate Safety awareness training;Tactile cues;Verbal cues related to their stance phase, stride length, heel strike, ankle position and motion, hip position and motion and guidance, safety and sequencing to promote proper body alignment, promote proper body posture and promote proper body mechanics.      Therapeutic Activity: (    10):  Therapeutic activities including Bed transfers and Chair transfers to improve mobility, strength, balance and coordination.  Required maximal Safety awareness training;Tactile cues;Verbal cues to promote dynamic balance in standing.     Group Therapeutic Exercise: ( minutes):  Exercises per grid below to improve mobility, strength, balance and coordination.  Required minimal visual, verbal and tactile cues to promote proper body alignment, promote proper body posture and promote proper body mechanics.     Date:   03/10/15 Date:  03/11/15 Date:  03/13/15 Date:  03/14/15   Activity/Exercise Parameters Parameters Group exs - AM Group exercises   Seated hip flexion x25B 2x20B x20B x20B A   Seated hip ab/adductuon x20B x20B x20B x20B A   LAQ x20B 2x20B x20B 2x20B A   Ankle plantarflexion x25B x25B x20B 2x20B A   Ankle dorsiflexion x25B x25B x20B 2x20B A     Braces/Orthotics/Lines/Etc:   ?? None  Safety:   After treatment position/precautions:  ?? Up in chair, Bed alarm/tab alert on, Bed/Chair-wheels locked and Call light within reach  Progression/Medical Necessity:   ?? Patient demonstrates good rehab potential due to higher previous functional level.  Compliance with Program/Exercises: Will assess as treatment progresses.   Reason for Continuation of Services/Other Comments:  ?? Patient continues to require skilled intervention due to decreased functional mobility, coordination, and balance/gait status from baseline..  Recommendations/Intent for next treatment session: Treatment next visit will focus on advancements to more challenging activities and reduction in assistance provided.    Total Treatment Duration:  Time In: 0850  Time Out: [place], DPT

## 2015-03-16 NOTE — Progress Notes (Signed)
Hospitalist Progress Note    03/16/2015  Admit Date: 02/24/2015 10:47 PM   NAME: Brett Becker   DOB:  09-03-1961   MRN:  [id number]   Attending: Caffie PintoAlexander M Drew, DO  PCP:  Phys Other, MD    SUBJECTIVE:   Patient 54 y.o. white?? male who presented with altered mental status on 02/25/2015.?? He has?? Had a prior admission for DT's in 2014.?? He was brought to the ED by a friend due to diminished responsiveness and seizure at home.??The Pt stateed that he normally drinks 6-8 beers per day and hadn't been drinking as much on the day of presentation.?? He also reported that he saw a red ball bouncing around his house today, but knew that it wasn't actually there. He was initially admitted to the ICU for DT's, alchol abuse and hepatitis C. He was at some point started on Keppra for seizure activity. He was assessed by speech & swallow and deemed to be an extreme aspiration risk. He was kept NPO and started on TPN.?? 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 MRI showed nothing acute. He continued to aspirate with feeds. As his mental status improved he became very upset by not being able to eat. He was eventually given trial and now doing well with mechanical soft diet and honey thick. He is unable to care for himself at home. He has had periods of agitation & confusion. He is awaiting placement.     4/3 - no acute complaints    Review of Systems negative with exception of pertinent positives noted above  PHYSICAL EXAM   BP 103/80 mmHg   Pulse 74   Temp(Src) 97.7 ??F (36.5 ??C)   Resp 18   Ht 5\' 8"  (1.727 m)   Wt 51.937 kg (114 lb 8 oz)   BMI 17.41 kg/m2   SpO2 97%   Temp (24hrs), Avg:98.2 ??F (36.8 ??C), Min:97.7 ??F (36.5 ??C), Max:98.8 ??F (37.1 ??C)    Oxygen Therapy  O2 Sat (%): 97 % (03/16/15 1048)  Pulse via Oximetry: 60 beats per minute (03/14/15 0548)  O2 Device: Room air (03/14/15 1547)  O2 Flow Rate (L/min): 3 l/min (03/14/15 0759)  No intake or output data in the 24 hours ending 03/16/15 1127    General: No acute distress????  Lungs:  CTA Bilaterally.   Heart:  Regular rate and rhythm,?? No murmur, rub, or gallop  Abdomen: Soft, Non distended, Non tender, Positive bowel sounds  Extremities: No cyanosis, clubbing or edema  Neurologic:?? No focal deficits    ASSESSMENT      Active Hospital Problems    Diagnosis Date Noted   ??? 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 Hyponatremia 02/25/2015   ??? Hepatitis C 08/31/2013   ??? HTN (hypertension) 08/30/2013   ??? Alcohol abuse 08/28/2013     A/P  - DT's - continued scheduled librium, prn meds. Continue keprra   - Hep C - aware  - HTn - controlled  - debility - pending rehab      DVT Prophylaxis: lovenox sq    Thera FlakeAllison C Asheley Hellberg, DO

## 2015-03-16 NOTE — Progress Notes (Signed)
Problem: Self Care Deficits Care Plan (Adult)  Goal: *Acute Goals and Plan of Care (Insert Text)    1. Patient will feed self entire meal with set-up. DISCONTINUE   2. Patient will complete grooming with set-up. GOAL MET  3. Patient will tolerate 15 minutes of OT treatment with no rest breaks to increase activity tolerance for ADLs. GOAL MET  4. Patient will complete functional transfers with minimal assistance and adaptive equipment as needed. CONTINUE  5. Patient will demonstrate fair standing balance to increase safety during standing ADLs. CONTINUE    New goals added 03/13/15:   6. Patient will participate in at least 25 minutes of BUE therapeutic exercises to increase strength for  functional transfers and ADLs.   7. Patient will complete upper body bathing and dressing with set-up.   8. Patient will complete lower body bathing and dressing with minimal assistance.   9. Patient will complete toileting with minimal assistance.     Timeframe: 7 visits      OCCUPATIONAL THERAPY: Daily Note, Treatment Day: 3rd and AM  INPATIENT: Medicaid : Hospital Day: 21    NAME/AGE/GENDER: Brett Becker is a 54 y.o. male  DATE: 03/16/2015  PRIMARY DIAGNOSIS: Delirium tremens ([place])  Delirium tremens ([place])  Delirium tremens ([place])        ICD-10: Treatment Diagnosis:   Unspecified lack of coordination (R27.9)  Dizziness and giddiness (R42)  Muscle weakness (generalized) (M62.81)      INTERDISCIPLINARY COLLABORATION: Physical Therapist, Certified Occupational Therapy Assistant and Registered Nurse  ASSESSMENT:   Mr. Decuir was admitted for the above diagnosis. Pt presents supine upon arrival finishing breakfast. Pt stated he did not want to get bathed today since he had one yesterday. Pt able to complete bed mobility with min assist. Pt required min assist for mobility. Pt able to perform grooming tasks (in grid below) with little assistance. Pt requires max assist for bowel hygiene due to decrease balance. Pt was left sitting up in chair  with all needs in reach. Will continue to benefit from skilled OT during stay.   ????????This section established at most recent assessment??????????  PROBLEM LIST (Impairments causing functional limitations):  1. Decreased Strength effecting function  2. Decreased ADL/Functional Activities  3. Decreased Transfer Abilities  4. Decreased Ambulation Ability/Technique  5. Decreased Balance  6. Decreased Activity Tolerance  7. Impaired balance affecting function  8. Impaired coordination affecting function  REHABILITATION POTENTIAL FOR STATED GOALS: GUARDED  PLAN OF CARE:  INTERVENTIONS PLANNED: (Benefits and precautions of occupational therapy have been discussed with the patient.)  1. Activities of daily living training  2. Adaptive equipment training  3. Cognitive training  4. Donning&doffing training  5. Group therapy  6. Theraputic activity  7. Theraputic exercise  FREQUENCY/DURATION: Follow patient 1-2 times per day/3-5 days per week until goals are met in order to address above goals.    RECOMMENDED REHABILITATION/EQUIPMENT: (at time of discharge pending progress):   Continue OT.  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 white  male who presents with altered mental status.  Pt has prior admission by me for DT's in 2014.  He was brought to the ED by a friend due to diminished responsiveness and seizure at home.  No family or friends present at this time.  Pt able to give limited history due to lethargy.  He denies ever having had a seizure in his life, though  he has had documented seizures previously.  Pt states he normally drinks 6-8 beers per day and hadn't been drinking as much today.  He denies chest pain, dyspnea, nausea, vomiting, abdominal pain.  He reports normally having a tremor, but it is worse at the present moment.  He also reports to me that he saw a red ball bouncing around his house today, but knew that it wasn't actually there."   Present Symptoms: confusion    Pain Intensity 1: 0  Prior Level of Function/Home Situation: Patient is a poor historian but reports that he lives with his friend. He says he is independent with ADLs. He uses a bike for communication and does not drive.    Home Environment: Private residence  One/Two Story Residence: One story  Living Alone: Yes  Support Systems: Friends _0  neighbors  Patient Expects to be Discharged to:: Apartment  Current DME Used/Available at Home: Cane, straight, Environmental consultant, rolling  OBJECTIVE/TREATMENT:   Self Care: (25 minutes): Procedure(s) (per grid) utilized to improve and/or restore self-care/home management as related to toileting and grooming. Required moderate verbal, manual and tactile cueing to facilitate activities of daily living skills.    Exercises completed with yellow theraband Date:  03/04/2015 Date:  03/10/2015  With red theraband Date:  03/13/15  (yellow theraband) Date:  03/14/15   Activity/Exercise Parameters Parameters Parameters    Horizontal Shoulder Abd/Adduction 15 reps 15 reps 10 reps BUE 15 reps   Shoulder Flexion 10 reps 10 reps 15 reps BUE 15 reps   Elbow Flexion 15 reps 15 reps 15 reps BUE 15 reps   Punches 10 reps 15 reps 15 reps BUE 15 reps                                                                          [place] AM-PACTM "6 Clicks"                                                       Daily Activity Inpatient Short Form  How much help from another person does the patient currently need... Total A Lot A Little None   1.  Putting on and taking off regular lower body clothing?   [] 1   [ X] 2   [ ] 3   [ ] 4   2.  Bathing (including washing, rinsing, drying)?   [] 1   [ X] 2   [ ] 3   [ ] 4   3.  Toileting, which includes using toilet, bedpan or urinal?   [] 1   Valu.Nieves ] 2   [ ] 3   [ ] 4   4.  Putting on and taking off regular upper body clothing?   [ ] 1   [] 2   [ X] 3   [ ] 4   5.  Taking care of personal grooming such as brushing teeth?   [ ] 1   []  2   [ X] 3   [ ] 4   6.  Eating meals?   [ ] 1   [ 2   [ X] 3   [ ] 4   ?? 2007, [employer], under license to [place]. All rights reserved       Score:  Initial: 9 Most Recent: 15 (Date: -- )   Interpretation of Tool:  Represents clinically-significant functional categories (i.e.Activities of daily living).  Score 24 23 22-20 19-14 13-9 8-7 6   Modifier CH CI CJ CK CL CM CN       ?? Self Care:              [phone] - CURRENT STATUS:           CK - 40%-59% impaired, limited or restricted              [id number] - GOAL STATUS:                   CJ - 20%-39% impaired, limited or restricted              [id number] - D/C STATUS:                       ---------------To be determined---------------  Payor: ADVICARE MEDICAID / Plan: [place] ADVICARE MEDICA[place] / Product Type: Managed Care Medicaid /      Balance  Sitting: Impaired  Sitting - Static: Fair (occasional)  Sitting - Dynamic: Fair (occasional)  Standing: Impaired  Standing - Static: Poor  Standing - Dynamic : Poor       Patient Vitals for the past 6 hrs:   BP BP Patient Position SpO2 Pulse   03/16/15 1048 103/80 mmHg At rest 97 % 74                           Most Recent Physical Functioning:   Gross Assessment:  AROM: Generally decreased, functional (BUE)  Strength: Generally decreased, functional (BUE)  Coordination: Generally decreased, functional (BUE)               Posture:     Balance:     Bed Mobility:     Wheelchair Mobility:     Transfers:        Mental Status  Neurologic State: Confused  Orientation Level: Oriented to person;Oriented to place  Cognition: Follows commands  Perception: Appears intact  Perseveration: No perseveration noted  Safety/Judgement: Awareness of environment;Fall prevention      Basic ADLs (From Assessment) Complex ADLs (From Assessment)   Basic ADL  Feeding: Minimum assistance  Oral Facial Hygiene/Grooming: Minimum assistance  Bathing: Moderate assistance  Upper Body Dressing: Minimum assistance   Lower Body Dressing: Moderate assistance  Toileting: Moderate assistance Instrumental ADL  Meal Preparation: Total assistance  Homemaking: Total assistance  Medication Management: Total assistance  Financial Management: Total assistance   Grooming/Bathing/Dressing Activities of Daily Living   Grooming  Washing Face: Supervision/set-up  Brushing/Combing Hair: Supervision/set-up Cognitive Retraining  Safety/Judgement: Awareness of environment;Fall prevention           Toileting  Bowel Hygiene: Maximum assistance  Clothing Management: Total assistance (dependent)     Functional Transfers  Toilet Transfer : Minimum assistance     Bed/Mat Mobility  Supine to Sit: Minimum assistance  Sit to Stand: Minimum assistance;Assist x2  Bed to Chair: Minimum assistance;Assist x2  Scooting: Minimum assistance          Braces/Orthotics/Lines/Other:   ?? Hand Dominance: unknown  ??  IV  ?? O2 Device: Nasal cannula    Safety:   After treatment position/precautions:  ?? Up in chair, Bed alarm/tab alert on, Bed/Chair-wheels locked and Call light within reach    Progression/Medical Necessity:   ?? Patient demonstrates fair rehab potential due to higher previous functional level.  Compliance with Program/Exercises: compliant some of the time.    Reason for Continuation of Services/Other Comments:  ?? Patient continues to require present interventions due to patient's inability to care for self without assistance.  Recommendations/Intent for next treatment session: Treatment next visit will focus on advancements to more challenging activities and reduction in assistance provided.  Total Treatment Duration:  Time In: 1287  Time Out: [place] Marri Mcneff, COTA

## 2015-03-17 LAB — METABOLIC PANEL, BASIC
Anion gap: 6 mmol/L — ABNORMAL LOW (ref 7–16)
BUN: 7 MG/DL (ref 6–23)
CO2: 27 mmol/L (ref 21–32)
Calcium: 7.6 MG/DL — ABNORMAL LOW (ref 8.3–10.4)
Chloride: 103 mmol/L (ref 98–107)
Creatinine: 0.57 MG/DL — ABNORMAL LOW (ref 0.8–1.5)
GFR est AA: >60 mL/min/{1.73_m2} (ref >60)
GFR est non-AA: >60 mL/min/{1.73_m2} (ref >60)
Glucose: 106 mg/dL — ABNORMAL HIGH (ref 65–100)
Potassium: 4 mmol/L (ref 3.5–5.1)
Sodium: 136 mmol/L (ref 136–145)

## 2015-03-17 LAB — MAGNESIUM: Magnesium: 1.4 mg/dL — CL (ref 1.8–2.4)

## 2015-03-17 LAB — PHOSPHORUS: Phosphorus: 3.1 MG/DL (ref 2.5–4.5)

## 2015-03-17 MED ORDER — MAGNESIUM OXIDE 400 MG TAB
400 mg | Freq: Two times a day (BID) | ORAL | Status: DC
Start: 2015-03-17 — End: 2015-03-17
  Administered 2015-03-17: 20:00:00 via ORAL

## 2015-03-17 MED ORDER — NICOTINE 21 MG/24 HR DAILY PATCH
21 mg/24 hr | MEDICATED_PATCH | Freq: Every day | TRANSDERMAL | Status: AC
Start: 2015-03-17 — End: 2015-04-16

## 2015-03-17 MED ORDER — MAGNESIUM SULFATE 2 GRAM/50 ML IVPB
2 gram/50 mL (4 %) | Freq: Once | INTRAVENOUS | Status: DC
Start: 2015-03-17 — End: 2015-03-17

## 2015-03-17 MED ORDER — CHLORDIAZEPOXIDE 10 MG CAP
10 mg | ORAL_CAPSULE | Freq: Two times a day (BID) | ORAL | Status: AC
Start: 2015-03-17 — End: 2015-03-20

## 2015-03-17 MED ORDER — IPRATROPIUM-ALBUTEROL 2.5 MG-0.5 MG/3 ML NEB SOLUTION
2.5 mg-0.5 mg/3 ml | Freq: Four times a day (QID) | RESPIRATORY_TRACT | Status: AC | PRN
Start: 2015-03-17 — End: ?

## 2015-03-17 MED ORDER — SCOPOLAMINE (1.3-1.5) MG 72 HR TRANSDERM PATCH
1 mg over 3 days | MEDICATED_PATCH | TRANSDERMAL | Status: DC
Start: 2015-03-17 — End: 2015-03-26

## 2015-03-17 MED ORDER — LEVETIRACETAM 500 MG TAB
500 mg | ORAL_TABLET | Freq: Two times a day (BID) | ORAL | Status: AC
Start: 2015-03-17 — End: ?

## 2015-03-17 MED ORDER — CLONIDINE 0.2 MG/24 HR WEEKLY TRANSDERM PATCH
0.2 mg/24 hr | MEDICATED_PATCH | TRANSDERMAL | Status: AC
Start: 2015-03-17 — End: ?

## 2015-03-17 MED ORDER — MAGNESIUM OXIDE 400 MG TAB
400 mg | ORAL_TABLET | Freq: Two times a day (BID) | ORAL | Status: AC
Start: 2015-03-17 — End: ?

## 2015-03-17 MED FILL — LOVENOX 40 MG/0.4 ML SUBCUTANEOUS SYRINGE: 40 mg/0.4 mL | SUBCUTANEOUS | Qty: 0.4

## 2015-03-17 MED FILL — NICOTINE 21 MG/24 HR DAILY PATCH: 21 mg/24 hr | TRANSDERMAL | Qty: 1

## 2015-03-17 MED FILL — CHLORDIAZEPOXIDE 10 MG CAP: 10 mg | ORAL | Qty: 1

## 2015-03-17 MED FILL — CLONIDINE 0.2 MG/24 HR WEEKLY TRANSDERM PATCH: 0.2 mg/24 hr | TRANSDERMAL | Qty: 1

## 2015-03-17 MED FILL — LEVETIRACETAM 500 MG TAB: 500 mg | ORAL | Qty: 1

## 2015-03-17 MED FILL — POTASSIUM CHLORIDE 10 % ORAL LIQUID: 20 mEq/15 mL | ORAL | Qty: 15

## 2015-03-17 MED FILL — MAGNESIUM OXIDE 400 MG TAB: 400 mg | ORAL | Qty: 2

## 2015-03-17 NOTE — Progress Notes (Signed)
Problem: Mobility Impaired Adult Goals   UPDATED: 03/11/15  STG:  (1.)Brett Becker will move from supine to sit and sit to supine , scoot up and down and roll side to side with CONTACT GUARD ASSIST within 3 day(s).    (2.)Brett Becker will transfer from bed to Becker and Becker to bed with MINIMAL ASSIST using the least restrictive device within 3 day(s).    (3.)Brett Becker will ambulate with MINIMAL ASSIST for 25+ feet with the least restrictive device within 3 day(s).   (4.)Brett Becker will tolerate 25+ minutes of therapeutic activity/exercise or neuromuscular re-education while maintaining stable vitals to improve functional strength and coordination within 3 day(s).  (5.)Brett Becker will demonstrate fair dynamic standing balance within 7 day(s).    LTG:  (1.)Brett Becker will move from supine to sit and sit to supine , scoot up and down and roll side to side in bed with INDEPENDENT within 7 day(s).    (2.)Brett Becker will transfer from bed to Becker and Becker to bed with CONTACT GUARD ASSIST using the least restrictive device within 7 day(s).    (3.)Brett Becker will ambulate with MINIMAL ASSIST for 50+ feet with the least restrictive device within 7 day(s).  (4.)Brett Becker will demonstrate good dynamic standing balance within 7 day(s).    ________________________________________________________________________________________________  PHYSICAL THERAPY: Daily Note, Treatment Day: 3rd and AM  INPATIENT: Medicaid : Hospital Day: 22    NAME/AGE/GENDER: Brett Becker is a 54 y.o. male  DATE: 03/17/2015  PRIMARY DIAGNOSIS: Delirium tremens ([place])  Delirium tremens ([place])  Delirium tremens ([place])        ICD-10: Treatment Diagnosis: Unsteadiness on feet; Unspecified lack of coordination   INTERDISCIPLINARY COLLABORATION: Physical Therapist, Certified Occupational Therapy Assistant and Registered Nurse  ASSESSMENT:   Brett Becker is a 54 year old male admitted with delirium tremens, AMS, and  suspected seizure activity. Presents supine in bed, endorses chest pain described as sharp, RN notified and MD aware and okay with treatment session. No continued c/o pain and no increased pain with mobility this AM. Brett Becker performed bed mobility with minimal assistance, transfers with maximal assistance x2, and ambulated 2x43f from bed to Brett Becker with maximal assistance x2. Maximal assistance with standing balance during ADL activity with COTA. Demonstrated a slow, shuffled step-to gait pattern with strong posterior lean and flexed trunk with inability to self correct despite verbal/visual/tactile cueing. Patient continues to have lack of insight into deficits and need for assistance stating, "just give me that walker and I can do it by myself." Brought to therapy gym and participated in B LE therapeutic exercises noted below to improve functional strength and mobility with emphasis on B LE extension ROM. Returned to room and left sitting up in bedside Becker with posey alarm activated and needs within reach. Fair progress today with patient requiring increased assistance with functional mobility but good participation in seated therapeutic exercises. Mr. BDonleycontinues to demonstrate decreased functional mobility and balance/gait status from baseline. Recommend continued skilled PT services to optimize functional independence and safety. Will continue to follow during acute stay.    ????????This section established at most recent assessment??????????  PROBLEM LIST (Impairments causing functional limitations):  1. Decreased Strength effecting function  2. Decreased ADL/Functional Activities  3. Decreased Transfer Abilities  4. Decreased Ambulation Ability/Technique  5. Decreased Balance  6. Decreased Activity Tolerance  7. Increased Fatigue effecting function  8. Increased Shortness of Breath affecting function  9. Decreased Knowledge of precautions  10.  Decreased Independence with Home Exercise Program   REHABILITATION POTENTIAL FOR STATED GOALS: GOOD      PLAN OF CARE:   INTERVENTIONS PLANNED: (Benefits and precautions of physical therapy have been discussed with the patient.)  1. balance exercise  2. bed mobility  3. family education  4. gait training  5. neuromuscular re-education/strengthening  6. range of motion: active/assisted/passive  7. therapeutic activities  8. therapeutic exercise/strengthening  9. transfer training  FREQUENCY/DURATION: Follow patient 1-2 times per day/4-7 days per week until goals are met in order to address above goals.    RECOMMENDED REHABILITATION/EQUIPMENT: (at time of discharge pending progress):   Rehab.  SUBJECTIVE:   "Alright."    Present Symptoms:   Pain Intensity 1: 4  Pain Location 1: Chest  Pain Orientation 1: Mid  Pain Intervention(s) 1:  (RN NOTIFIED AND MD AWARE; OKAY with [facility])     History of Present Injury/Illness: Per chart:  Patient is a 54 y.o. white  male who presents with altered mental status.  Pt has prior admission by me for DT's in 2014.  He was brought to the ED by a friend due to diminished responsiveness and seizure at home.  No family or friends present at this time.  Pt able to give limited history due to lethargy.  He denies ever having had a seizure in his life, though he has had documented seizures previously.  Pt states he normally drinks 6-8 beers per day and hadn't been drinking as much today.  He denies chest pain, dyspnea, nausea, vomiting, abdominal pain.  He reports normally having a tremor, but it is worse at the present moment.  He also reports to me that he saw a red ball bouncing around his house today, but knew that it wasn't actually there.      Past Medical History    Diagnosis  Date    ???  Delirium tremens ([place])  September, 2014        Admitted with DTs and seizure activity.  Found to have GI bleeding secondary to gastric ullcers.    ???  Gastric ulcer  September, 2014        Found at EGD to have major gastric ulcers.           Past Surgical History    Procedure  Laterality  Date    ???  Pr neurological procedure unlisted        ???  Hx back surgery            x 2    ???  Hx endoscopy    September, 2014              Prior Level of Function/Home Situation:   History provided by patient/chart; patient is a poor historian at time of initial assessment.  Patient live alone in a private residence/apartment.  At baseline, independent with ADLs and ambulates with a straight cane.  Home Environment: Private residence  One/Two Story Residence: One story  Living Alone: Yes  Support Systems: Friends \\ neighbors  Patient Expects to be Discharged to:: Apartment  Current DME Used/Available at Home: Cane, straight, Environmental consultant, rolling  OBJECTIVE/TREATMENT:   (In addition to Assessment/Re-Assessment sessions the following treatments were rendered)                                      [facility]??? ???6 Clicks???  Basic Mobility Inpatient Short Form  How much difficulty does the patient currently have... Unable A Lot A Little None   1.  Turning over in bed (including adjusting bedclothes, sheets and blankets)?   '[ ]'  1   '[ ]'  2   '[]'  3   [ X] 4   2.  Sitting down on and standing up from a Becker with arms ( e.g., wheelchair, bedside commode, etc.)   '[ ]'  1   '[]'  2   Valu.Nieves ] 3   '[ ]'  4   3.  Moving from lying on back to sitting on the side of the bed?   '[ ]'  1   '[ ]'  2   '[X]'  3   '[ ]'  4               How much help from another person does the patient currently need... Total A Lot A Little None   4.  Moving to and from a bed to a Becker (including a wheelchair)?   '[ ]'  1   '[X]'  2   '[ ]'  3   '[ ]'  4   5.  Need to walk in hospital room?   '[ ]'  1   '[X]'  2   '[ ]'  3   '[ ]'  4   6.  Climbing 3-5 steps with a railing?   '[ ]'  1   '[X]'  2   '[ ]'  3   '[ ]'  4   ?? 2007, [employer], under license to [place]. All rights reserved       Score:  Initial: 14 Most Recent: 16 (Date: 03/10/14 )    Interpretation of Tool:  Represents activities that are increasingly more difficult (i.e. Bed mobility, Transfers, Gait).  Score 24 23 22-20 19-15 14-10 9-7 6   Modifier CH CI CJ CK CL CM CN       ?? Mobility - Walking and Moving Around:              [id number] - CURRENT STATUS:       CK - 40%-59% impaired, limited or restricted              [id number] - GOAL STATUS:               CJ - 20%-39% impaired, limited or restricted              [id number] - D/C STATUS:                    ---------------To be determined---------------  Payor: ADVICARE MEDICAID / Plan: [place] ADVICARE [place]ICA[place] / Product Type: Managed Care Medicaid /    Most Recent Physical Functioning:   Gross Assessment:  AROM: Generally decreased, functional (B hip flexion; B shoulder flexion)  Strength: Generally decreased, functional (B UE 4-/5, L LE 4/5, R LE 4-/5)  Coordination: Generally decreased, functional (B LE)  Sensation: Impaired (Slightly diminished light touch L3-S1 on right)               Posture:  Posture (WDL): Exceptions to WDL  Posture Assessment: Forward head, Rounded shoulders  Balance:  Sitting: Impaired  Sitting - Static: Fair (occasional)  Sitting - Dynamic: Fair (occasional)  Standing: Impaired  Standing - Static: Poor  Standing - Dynamic : Poor  Bed Mobility:  Supine to Sit: Minimum assistance  Scooting: Minimum assistance  Interventions: Safety awareness training;Tactile cues;Verbal cues  Wheelchair Mobility:     Transfers:  Sit to Stand: Maximum assistance;Assist x2  Stand to Sit: Maximum assistance;Assist x2  Bed to Becker: Maximum assistance;Assist x2  Interventions: Manual cues;Safety awareness training;Tactile cues;Verbal cues;Visual cues  Gait:     Base of Support: Narrowed;Center of gravity altered  Speed/Cadence: Shuffled;Slow  Step Length: Left shortened;Right shortened  Gait Abnormalities: Decreased step clearance;Shuffling gait;Step to gait;Other;Path deviations  Distance (ft): 8 Feet (ft) (2x28f)   Ambulation - Level of Assistance: Maximum assistance;Assist x2  Interventions: Safety awareness training;Tactile cues;Verbal cues        Therapeutic Activity: (    30 minutes ):  Therapeutic activities including bed mobility, transfer training, balance training, safety awareness training, ambulation on level ground 2x461f and patient education to improve mobility, strength, balance and coordination.  Required maximal Safety awareness training;Tactile cues;Verbal cues to promote static and dynamic balance in standing and promote coordination of bilateral, lower extremity(s).     GROUP Therapeutic Exercise: ( 20 minutes):  Exercises per grid below to improve mobility, strength, balance and coordination.  Required minimal visual, verbal and tactile cues to promote proper body alignment, promote proper body posture and promote proper body mechanics.     Date:  03/10/15 Date:  03/11/15 Date:  03/13/15 Date:  03/14/15 Date:  03/17/15   Activity/Exercise Parameters Parameters Group exs - AM Group exercises    Seated hip flexion x25B 2x20B x20B x20B A x20B A   Seated hip ab/adductuon x20B x20B x20B x20B A x20B AA   LAQ x20B 2x20B x20B 2x20B A x20B A   Ankle plantarflexion x25B x25B x20B 2x20B A x20B A   Ankle dorsiflexion x25B x25B x20B 2x20B A x20B A     Braces/Orthotics/Lines/Etc:   ?? None  Safety:   After treatment position/precautions:  ?? Up in Becker, Bed alarm/tab alert on, Bed/Becker-wheels locked and Call light within reach  Progression/Medical Necessity:   ?? Patient demonstrates good rehab potential due to higher previous functional level.  Compliance with Program/Exercises: Will assess as treatment progresses.   Reason for Continuation of Services/Other Comments:  ?? Patient continues to require skilled intervention due to decreased functional mobility, coordination, and balance/gait status from baseline..  Recommendations/Intent for next treatment session: Treatment next visit  will focus on advancements to more challenging activities and reduction in assistance provided.    Total Treatment Duration:  Time In: 0945 ([id number] Time Out: [id number])  ChHarrietta GuardianPT

## 2015-03-17 NOTE — Progress Notes (Signed)
[facility]

## 2015-03-17 NOTE — Progress Notes (Signed)
Problem: Self Care Deficits Care Plan (Adult)  Goal: *Acute Goals and Plan of Care (Insert Text)    1. Patient will feed self entire meal with set-up. DISCONTINUE   2. Patient will complete grooming with set-up. GOAL MET  3. Patient will tolerate 15 minutes of OT treatment with no rest breaks to increase activity tolerance for ADLs. GOAL MET  4. Patient will complete functional transfers with minimal assistance and adaptive equipment as needed. CONTINUE  5. Patient will demonstrate fair standing balance to increase safety during standing ADLs. CONTINUE    New goals added 03/13/15:   6. Patient will participate in at least 25 minutes of BUE therapeutic exercises to increase strength for  functional transfers and ADLs.   7. Patient will complete upper body bathing and dressing with set-up.   8. Patient will complete lower body bathing and dressing with minimal assistance.   9. Patient will complete toileting with minimal assistance.     Timeframe: 7 visits      OCCUPATIONAL THERAPY: Daily Note, Treatment Day: 4th and AM  INPATIENT: Medicaid : Hospital Day: 22    NAME/AGE/GENDER: Brett Becker is a 54 y.o. male  DATE: 03/17/2015  PRIMARY DIAGNOSIS: Delirium tremens ([place])  Delirium tremens ([place])  Delirium tremens ([place])        ICD-10: Treatment Diagnosis:   Unspecified lack of coordination (R27.9)  Dizziness and giddiness (R42)  Muscle weakness (generalized) (M62.81)      INTERDISCIPLINARY COLLABORATION: Physical Therapist, Certified Occupational Therapy Assistant and Registered Nurse  ASSESSMENT:   Brett Becker was presented in supine upon arrival. Pt transferred to sitting with minimal assistance and additional time. Pt completed sit to stand and toilet transfer with maximal assistance x's 2 and total assistance with clothing management. Pt was transported down to the gym in a w/c to participate in group exercises. Pt completed exercises with visual and verbal cues. Pt was transported back to room and transferred to his  recliner chair with maximal assistance x's 2 with posey pad in place and belongings in reach. Pt participated well and will continue to benefit from OT.   ????????This section established at most recent assessment??????????  PROBLEM LIST (Impairments causing functional limitations):  1. Decreased Strength effecting function  2. Decreased ADL/Functional Activities  3. Decreased Transfer Abilities  4. Decreased Ambulation Ability/Technique  5. Decreased Balance  6. Decreased Activity Tolerance  7. Impaired balance affecting function  8. Impaired coordination affecting function  REHABILITATION POTENTIAL FOR STATED GOALS: GUARDED  PLAN OF CARE:  INTERVENTIONS PLANNED: (Benefits and precautions of occupational therapy have been discussed with the patient.)  1. Activities of daily living training  2. Adaptive equipment training  3. Cognitive training  4. Donning&doffing training  5. Group therapy  6. Theraputic activity  7. Theraputic exercise  FREQUENCY/DURATION: Follow patient 1-2 times per day/3-5 days per week until goals are met in order to address above goals.    RECOMMENDED REHABILITATION/EQUIPMENT: (at time of discharge pending progress):   Continue OT.  SUBJECTIVE:   "I didn't sleep well last night."    History of Present Injury/Illness: Per H&P, "Patient is a 54 y.o. white  male who presents with altered mental status.  Pt has prior admission by me for DT's in 2014.  He was brought to the ED by a friend due to diminished responsiveness and seizure at home.  No family or friends present at this time.  Pt able to give limited history due to lethargy.  He denies ever having  had a seizure in his life, though he has had documented seizures previously.  Pt states he normally drinks 6-8 beers per day and hadn't been drinking as much today.  He denies chest pain, dyspnea, nausea, vomiting, abdominal pain.  He reports normally having a tremor, but it is worse at the present moment.  He also reports to me that  he saw a red ball bouncing around his house today, but knew that it wasn't actually there."  Present Symptoms: confusion    Pain Intensity 1: 0  Prior Level of Function/Home Situation: Patient is a poor historian but reports that he lives with his friend. He says he is independent with ADLs. He uses a bike for communication and does not drive.    Home Environment: Private residence  One/Two Story Residence: One story  Living Alone: Yes  Support Systems: Friends \\ neighbors  Patient Expects to be Discharged to:: Apartment  Current DME Used/Available at Home: Cane, straight, Walker, rolling  OBJECTIVE/TREATMENT:   Therapeutic Activity: (    12 minutes):  Therapeutic activities including Bed transfers, Chair transfers, Toilet transfers and static/dynamic standing to improve mobility, strength and balance.  Required maximal Safety awareness training;Tactile cues;Verbal cues to promote static and dynamic balance in standing.   GroupTherapeutic Exercise: (20 Minutes):  Exercises per grid below to improve mobility and strength.  Required minimal visual and verbal cues to promote proper body mechanics.  Progressed range as indicated.      Exercises completed with yellow theraband Date:  03/04/2015 Date:  03/10/2015  With red theraband Date:  03/13/15  (yellow theraband) Date:  03/14/15 Date:  03/17/15   Activity/Exercise Parameters Parameters Parameters     Horizontal Shoulder Abd/Adduction 15 reps 15 reps 10 reps BUE 15 reps 15 reps   Shoulder Flexion 10 reps 10 reps 15 reps BUE 15 reps 15 reps   Elbow Flexion 15 reps 15 reps 15 reps BUE 15 reps 15 reps   Punches 10 reps 15 reps 15 reps BUE 15 reps 15 reps                                                                             [place] AM-PACTM "6 Clicks"                                                       Daily Activity Inpatient Short Form  How much help from another person does the patient currently need... Total A Lot A Little None    1.  Putting on and taking off regular lower body clothing?   _0  1   [ X] 2   _1  3   _2  4   2.  Bathing (including washing, rinsing, drying)?   _3  1   [ X] 2   _4  3   _5  4   3.  Toileting, which includes using toilet, bedpan or urinal?   _6  1   [place]u.[place] ] 2   _7  3   _8  4  4.  Putting on and taking off regular upper body clothing?   [ ]  1   []  2   [ X] 3   [ ]  4   5.  Taking care of personal grooming such as brushing teeth?   [ ]  1   []  2   [ X] 3   [ ]  4   6.  Eating meals?   [ ]  1   [ 2   [ X] 3   [ ]  4   ?? 2007, Trustees of [place] [place], under license to [employer]. All rights reserved       Score:  Initial: 9 Most Recent: 15 (Date: -- )   Interpretation of Tool:  Represents clinically-significant functional categories (i.e.Activities of daily living).  Score 24 23 22-20 19-14 13-9 8-7 6   Modifier CH CI CJ CK CL CM CN       ?? Self Care:              [id number] - CURRENT STATUS:           CK - 40%-59% impaired, limited or restricted              [id number] - GOAL STATUS:                   CJ - 20%-39% impaired, limited or restricted              [id number] - D/C STATUS:                       ---------------To be determined---------------  Payor: ADVICARE MEDICAID / Plan: [place] MEDICA[place] / Product Type: Managed Care Medicaid /      Balance  Sitting: Impaired  Sitting - Static: Fair (occasional)  Sitting - Dynamic: Fair (occasional)  Standing: Impaired  Standing - Static: Poor  Standing - Dynamic : Poor       Patient Vitals for the past 6 hrs:   BP SpO2 Pulse   03/17/15 0746 98/64 mmHg 92 % 64   03/17/15 1149 110/75 mmHg 96 % 68                           Most Recent Physical Functioning:   Gross Assessment:  AROM: Generally decreased, functional (BUE)  Strength: Generally decreased, functional (BUE)  Coordination: Generally decreased, functional (BUE)               Posture:     Balance:  Sitting: Impaired  Sitting - Static: Prop sitting  Standing: Impaired  Standing - Static: Poor  Standing - Dynamic : Poor   Bed Mobility:     Wheelchair Mobility:     Transfers:        Mental Status  Neurologic State: Confused  Orientation Level: Disoriented to time      Basic ADLs (From Assessment) Complex ADLs (From Assessment)   Basic ADL  Feeding: Minimum assistance  Oral Facial Hygiene/Grooming: Minimum assistance  Bathing: Moderate assistance  Upper Body Dressing: Minimum assistance  Lower Body Dressing: Moderate assistance  Toileting: Moderate assistance Instrumental ADL  Meal Preparation: Total assistance  Homemaking: Total assistance  Medication Management: Total assistance  Financial Management: Total assistance   Grooming/Bathing/Dressing Activities of Daily Living                 Toileting  Toileting Assistance: Maximum assistance  Clothing Management: Total assistance (dependent)  Functional Transfers  Bathroom Mobility: Maximum assistance  Toilet Transfer : Maximum assistance;Assist x2     Bed/Mat Mobility  Supine to Sit: Minimum assistance  Sit to Stand: Maximum assistance;Assist x2  Bed to Chair: Maximum assistance;Assist x2  Scooting: Minimum assistance          Braces/Orthotics/Lines/Other:   ?? Hand Dominance: unknown  ?? IV  ?? O2 Device: Nasal cannula    Safety:   After treatment position/precautions:  ?? Up in chair, Bed alarm/tab alert on, Bed/Chair-wheels locked and Call light within reach    Progression/Medical Necessity:   ?? Patient demonstrates fair rehab potential due to higher previous functional level.  Compliance with Program/Exercises: compliant some of the time.    Reason for Continuation of Services/Other Comments:  ?? Patient continues to require present interventions due to patient's inability to care for self without assistance.  Recommendations/Intent for next treatment session: Treatment next visit will focus on advancements to more challenging activities and reduction in assistance provided.  Total Treatment Duration:  Time In: 2094 (7096)  Time Out: 2836 (1100)  Rutherford Nail, COTA

## 2015-03-17 NOTE — Discharge Summary (Signed)
Patient 54 y.o. white?? male who presented with altered mental status on 02/25/2015.?? He has??history of alcohol abuse and had a prior admission for DT's in 2014.?? He was brought to the ED by a friend due to diminished responsiveness and seizure at home.??The Pt stated that he normally drinks 6-8 beers per day and hadn't been drinking as much on the day of presentation.?? He also reported that he saw a red ball bouncing around his house, but knew that it wasn't actually there. He was initially admitted to the ICU for DT's, alchol abuse and hepatitis C. He was at some point started on Keppra for seizure activity. He was assessed by speech & swallow and deemed to be an extreme aspiration risk. He was kept NPO and started on TPN.?? 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 MRI showed nothing acute. He continued to aspirate with feeds. As his mental status improved, however, he became very upset by not being able to eat. He was re-evalauated by speech and eventually given oral trial and now doing well with mechanical soft diet and honey thick. He is unable to care for himself at home. He has had periods of agitation & confusion. He was accepted to [facility] for rehab.  Pt is doing well and stable on discharge.    Visit Vitals   Item Reading   ??? BP 111/68 mmHg   ??? Pulse 76   ??? Temp 98.6 ??F (37 ??C)   ??? Resp 18   ??? Ht 5\' 8"  (1.727 m)   ??? Wt 51.937 kg (114 lb 8 oz)   ??? BMI 17.41 kg/m2   ??? SpO2 92%   Gen: no discharge  Lungs: CTA b/l  CVS: +s1s2 reg  Abd: soft, nontender  Ext: no edema or cyanosis  Psych: A+OX2    Current Discharge Medication List      START taking these medications    Details   chlordiazePOXIDE (LIBRIUM) 10 mg capsule Take 1 Cap by mouth two (2) times a day for 3 days. Max Daily Amount: 20 mg.  Qty: 6 Cap, Refills: 0      levETIRAcetam (KEPPRA) 500 mg tablet Take 1 Tab by mouth two (2) times a day.  Qty: 60 Tab, Refills: 0       scopolamine (TRANSDERM-SCOP) 1.5 mg (1 mg over 3 days) pt3d 1 Patch by TransDERmal route every seventy-two (72) hours.  Qty: 10 Patch, Refills: 0      cloNIDine (CATAPRES) 0.2 mg/24 hr patch 1 Patch by TransDERmal route every seven (7) days.  Qty: 4 Patch, Refills: 0      albuterol-ipratropium (DUO-NEB) 2.5 mg-0.5 mg/3 ml nebu 3 mL by Nebulization route every six (6) hours as needed.  Qty: 300 mL, Refills: 0      magnesium oxide (MAG-OX) 400 mg tablet Take 1 Tab by mouth two (2) times a day.  Qty: 60 Tab, Refills: 0      nicotine (NICODERM CQ) 21 mg/24 hr 1 Patch by TransDERmal route daily for 30 days.  Qty: 30 Patch, Refills: 0           45 minutes spent on discharge and coordination of care.    Colbert EwingKerisea Esteban Kobashigawa

## 2015-03-17 NOTE — Progress Notes (Addendum)
CLTC level of care approval received.  [facility] when medically cleared for discharge.  MD notified of approval.    1559:  Pt medically cleared for discharge today and will transfer to SNF for STR to possible LTC placement.  Orders faxed to facility prior to discharge and originals will be [facility] to facility [facility].  SW notified pt's dtr, via phone, of pt's discharge and tentative transport time.  [place] [phone]) with confirmation # [id number].  They will pick up the pt anytime between 1615 and 1845.

## 2015-03-18 NOTE — Other (Signed)
03/17/15 continue stay review by Tawny Asal, RN      Review Entered Review Status     03/17/2015      Details            VS T 98.3 P 68 B/P 110/75 R 16 sat 96% room air   Labs Na 136 K 4 Cl 103 CO2 27 Anion gap 6 BUN 7 Creat 0.57 Ca 7.6 Phos 3.1 Mag 1.4   Treatment Keppra 500 mg po bid Librium 10 mg po tid Lovenox 40 mg SQ qd KCL 20 meq liquid po qd VS qsh I/O qsh PT/ OT/ST consult Mechanical soft diet           Additional Notes     Pending progress notes        03/16/15 continue stay review by Tawny Asal, RN      Review Entered Review Status     03/16/2015      Details      General: No acute distress   Lungs: CTA Bilaterally.   Heart: Regular rate and rhythm, No murmur, rub, or gallop  Abdomen: Soft, Non distended, Non tender, Positive bowel sounds  Extremities: No cyanosis, clubbing or edema  Neurologic: No focal deficits  Assessment/Plan  DT's - continued scheduled librium, prn meds. Continue keprra   Hep C - aware HTn - controlled debility - pending rehab   VS T 97.7 P 74 B/P 103/80 R 18 sat 97%   Labs Na 136 K 3.3 Cl 104 CO2 26 Anion gap 6 BUN 9 Creat 0.64 Ca 7.2   Treatment Keppra 500 mg po bid Librium 10 mg po tid Lovenox 40 mg SQ qd KCL 20 meq liquid po qd VS qsh I/O qsh PT/ OT/ST consult Mechanical soft diet              03/15/15 continue stay review by Tawny Asal, RN      Review Entered Review Status     03/16/2015      Details      He continued to aspirate with feeds. As his mental status improved he became very upset by not being able to eat. He was re-assessed for his swallowing ability. He failed. He was re-assessed a few days later and though not a full pass he had improved. We decided to try him on a trial of po intake and he did well and has continued to improve with his po intake. He is unable to care for himself at home. He has had periods of agitation & confusion. He is awaiting placement.  4/2- "im going to die of boredom". No o/n events  General: No acute distress   Lungs: CTA Bilaterally.    Heart: Regular rate and rhythm, No murmur, rub, or gallop  Abdomen: Soft, Non distended, Non tender, Positive bowel sounds  Extremities: No cyanosis, clubbing or edema  Neurologic: No focal deficits  Assessment/Plan  DT's - continued scheduled librium, prn meds. Continue keprra   Hep C - aware HTn - controlled  debility - pending rehab awaiting for CLTC to complete level of care for NH  VS T 98.8 P 75 B/P 103/69 R 18 sat 92%   Labs Na 137 K 3.4 Cl 104 CO2 25 Anion gap 8 glucose 101 BUN 9 Creat 0.55 Ca 7.5  Treatment Keppra 500 mg po bid Librium 10 mg po tid Lovenox 40 mg SQ qd VS qsh I/O qsh PT/ OT/ST consult Mechanical soft diet  03/14/15 continue stay review by Tawny AsalLisa Baille, RN      Review Entered Review Status     03/14/2015      Details      no acute complaints.   BP 93/67 mmHg   Pulse 78   Temp(Src) 98.9 ??F (37.2 ??C)   Resp 16   sat 92%   General: No acute distress   Lungs: CTA Bilaterally.   Heart: Regular rate and rhythm, No murmur, rub, or gallop  Abdomen: Soft, Non distended, Non tender, Positive bowel sounds  Extremities: No cyanosis, clubbing or edema  Neurologic: No focal deficits  A/P  - DT's - continued scheduled librium, prn meds. Continue keprra   - Hep C - aware  - HTn - controlled  - debility - pending rehab awaiting for [facility]TC to complete level of care  DVT Prophylaxis: lovenox sq  Labs Na 138 K 3.4 Cl 105 CO2 28 Anion gap 5 Glucose 107 BUN 10 Creat 0.61 Mag 1.7   Treatment Keppra 500 mg po bid Librium 10 mg po tid Lovenox 40 mg SQ qd VS qsh I/O qsh PT/ OT/ST consult Mechanical soft diet

## 2015-03-20 ENCOUNTER — Emergency Department: Payer: MEDICAID | Primary: Family Medicine

## 2015-03-20 ENCOUNTER — Emergency Department: Admit: 2015-03-20 | Payer: MEDICAID | Primary: Family Medicine

## 2015-03-20 ENCOUNTER — Inpatient Hospital Stay
Admit: 2015-03-20 | Discharge: 2015-03-26 | Disposition: A | Discharge status: Skilled Nursing Facility | Payer: MEDICAID | Attending: Internal Medicine | Admitting: Internal Medicine

## 2015-03-20 DIAGNOSIS — J441 Chronic obstructive pulmonary disease with (acute) exacerbation: Principal | ICD-10-CM

## 2015-03-20 LAB — METABOLIC PANEL, COMPREHENSIVE
A-G Ratio: 0.6 — ABNORMAL LOW (ref 1.2–3.5)
ALT (SGPT): 104 U/L — ABNORMAL HIGH (ref 12–65)
AST (SGOT): 51 U/L — ABNORMAL HIGH (ref 15–37)
Albumin: 2.6 g/dL — ABNORMAL LOW (ref 3.5–5.0)
Alk. phosphatase: 109 U/L (ref 50–136)
Anion gap: 7 mmol/L (ref 7–16)
BUN: 10 MG/DL (ref 6–23)
Bilirubin, total: 0.6 MG/DL (ref 0.2–1.1)
CO2: 25 mmol/L (ref 21–32)
Calcium: 8.3 MG/DL (ref 8.3–10.4)
Chloride: 101 mmol/L (ref 98–107)
Creatinine: 0.81 MG/DL (ref 0.8–1.5)
GFR est AA: >60 mL/min/{1.73_m2} (ref >60)
GFR est non-AA: >60 mL/min/{1.73_m2} (ref >60)
Globulin: 4.7 g/dL — ABNORMAL HIGH (ref 2.3–3.5)
Glucose: 107 mg/dL — ABNORMAL HIGH (ref 65–100)
Potassium: 4.5 mmol/L (ref 3.5–5.1)
Protein, total: 7.3 g/dL (ref 6.3–8.2)
Sodium: 133 mmol/L — ABNORMAL LOW (ref 136–145)

## 2015-03-20 LAB — BLOOD GAS, ARTERIAL
Arterial O2 Hgb: 92.3 % — ABNORMAL LOW (ref 94.0–97.0)
BASE DEFICIT: 1.2 mmol/L (ref 0–2)
BICARBONATE: 23 mmol/L (ref 22.0–26.0)
CARBOXYHEMOGLOBIN: 0.6 % (ref 0.5–1.5)
DEOXYHEMOGLOBIN: 7 % — ABNORMAL HIGH (ref 0.0–5.0)
METHEMOGLOBIN: 0.4 % (ref 0.0–1.5)
O2 FLOW: 3 L/min
O2 SAT: 93 % (ref 92.0–98.5)
PCO2: 38 mmHg (ref 35.0–45.0)
PO2: 68 mmHg — ABNORMAL LOW (ref 75.0–100.0)
TOTAL HEMOGLOBIN: 15.1 GM/DL — ABNORMAL HIGH (ref 11.7–15.0)
pH: 7.4 (ref 7.35–7.45)

## 2015-03-20 LAB — CBC WITH AUTOMATED DIFF
ABS. BASOPHILS: 0 10*3/uL (ref 0.0–0.2)
ABS. EOSINOPHILS: 0.3 10*3/uL (ref 0.0–0.8)
ABS. IMM. GRANS.: 0 10*3/uL (ref 0.0–0.5)
ABS. LYMPHOCYTES: 0.3 10*3/uL — ABNORMAL LOW (ref 0.5–4.6)
ABS. MONOCYTES: 0.4 10*3/uL (ref 0.1–1.3)
ABS. NEUTROPHILS: 11.7 10*3/uL — ABNORMAL HIGH (ref 1.7–8.2)
BASOPHILS: 0 % (ref 0.0–2.0)
EOSINOPHILS: 3 % (ref 0.5–7.8)
HCT: 39.7 % — ABNORMAL LOW (ref 41.1–50.3)
HGB: 13.9 g/dL (ref 13.6–17.2)
IMMATURE GRANULOCYTES: 0.2 % (ref 0.0–5.0)
LYMPHOCYTES: 2 % — ABNORMAL LOW (ref 13–44)
MCH: 34.7 PG — ABNORMAL HIGH (ref 26.1–32.9)
MCHC: 35 g/dL (ref 31.4–35.0)
MCV: 99 FL — ABNORMAL HIGH (ref 79.6–97.8)
MONOCYTES: 3 % — ABNORMAL LOW (ref 4.0–12.0)
MPV: 9.4 FL — ABNORMAL LOW (ref 10.8–14.1)
NEUTROPHILS: 92 % — ABNORMAL HIGH (ref 43–78)
PLATELET: 292 10*3/uL (ref 150–450)
RBC: 4.01 M/uL — ABNORMAL LOW (ref 4.23–5.67)
RDW: 11.6 % — ABNORMAL LOW (ref 11.9–14.6)
WBC: 12.8 10*3/uL — ABNORMAL HIGH (ref 4.3–11.1)

## 2015-03-20 LAB — PROCALCITONIN: Procalcitonin: 0.1 ng/mL

## 2015-03-20 LAB — LACTIC ACID - RESPIRATORY: Lactic acid: 1.3 MMOL/L (ref 0.4–2.0)

## 2015-03-20 MED ORDER — LORAZEPAM 2 MG/ML IJ SOLN
2 mg/mL | INTRAMUSCULAR | Status: AC
Start: 2015-03-20 — End: 2015-03-20
  Administered 2015-03-20: 23:00:00 via INTRAVENOUS

## 2015-03-20 MED ORDER — LORAZEPAM 1 MG TAB
1 mg | ORAL | Status: AC
Start: 2015-03-20 — End: 2015-03-20
  Administered 2015-03-20: 23:00:00 via ORAL

## 2015-03-20 MED ORDER — METHYLPREDNISOLONE (PF) 125 MG/2 ML IJ SOLR
125 mg/2 mL | INTRAMUSCULAR | Status: AC
Start: 2015-03-20 — End: 2015-03-20
  Administered 2015-03-20: 23:00:00 via INTRAVENOUS

## 2015-03-20 MED ORDER — ALBUTEROL SULFATE 0.083 % (0.83 MG/ML) SOLN FOR INHALATION
2.5 mg /3 mL (0.083 %) | RESPIRATORY_TRACT | Status: AC
Start: 2015-03-20 — End: 2015-03-20

## 2015-03-20 MED ORDER — SODIUM CHLORIDE 0.9% BOLUS IV
0.9 % | Freq: Once | INTRAVENOUS | Status: AC
Start: 2015-03-20 — End: 2015-03-20
  Administered 2015-03-20: 23:00:00 via INTRAVENOUS

## 2015-03-20 MED ORDER — IPRATROPIUM-ALBUTEROL 2.5 MG-0.5 MG/3 ML NEB SOLUTION
2.5 mg-0.5 mg/3 ml | RESPIRATORY_TRACT | Status: AC
Start: 2015-03-20 — End: 2015-03-20
  Administered 2015-03-20: 23:00:00 via RESPIRATORY_TRACT

## 2015-03-20 MED ORDER — ALBUTEROL SULFATE 0.083 % (0.83 MG/ML) SOLN FOR INHALATION
2.5 mg /3 mL (0.083 %) | RESPIRATORY_TRACT | Status: AC
Start: 2015-03-20 — End: 2015-03-20
  Administered 2015-03-20: 23:00:00 via RESPIRATORY_TRACT

## 2015-03-20 MED FILL — ALBUTEROL SULFATE 0.083 % (0.83 MG/ML) SOLN FOR INHALATION: 2.5 mg /3 mL (0.083 %) | RESPIRATORY_TRACT | Qty: 4

## 2015-03-20 MED FILL — LORAZEPAM 2 MG/ML IJ SOLN: 2 mg/mL | INTRAMUSCULAR | Qty: 1

## 2015-03-20 MED FILL — IPRATROPIUM-ALBUTEROL 2.5 MG-0.5 MG/3 ML NEB SOLUTION: 2.5 mg-0.5 mg/3 ml | RESPIRATORY_TRACT | Qty: 3

## 2015-03-20 MED FILL — SOLU-MEDROL (PF) 125 MG/2 ML SOLUTION FOR INJECTION: 125 mg/2 mL | INTRAMUSCULAR | Qty: 2

## 2015-03-20 MED FILL — LORAZEPAM 1 MG TAB: 1 mg | ORAL | Qty: 1

## 2015-03-20 NOTE — ED Notes (Signed)
Pt is pulling ekg leads and O2 monitor off. Pt redirected to leave monitoring devices in place for his safety.

## 2015-03-20 NOTE — H&P (Signed)
HISTORY AND PHYSICAL      Brett Becker    03/20/2015    Date of Admission:  03/20/2015    The patient's chart is reviewed and the patient is discussed with the staff.    Subjective:     Patient is a 53 y.o. Caucasian male presents with altered mental status.   He was just discharged from the hospitalist service 3 days ago for ETOH withdrawal. He was discharged to [facility]. Now he returns with report of cough. The patient himself is unable to provide me with any details whatsoever, and appears again to be going through ETOH withdrawal. We were asked to admit him for COPD exacerbation.      Review of Systems  Unable to obtain due to patient's condition.    Patient Active Problem List   Diagnosis Code   ??? 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 Alcohol abuse F10.10   ??? HTN (hypertension) I10   ??? Gastric ulcer K25.9   ??? Hepatitis C B19.20   ??? 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 Hyponatremia E87.1   ??? COPD exacerbation (HCC) J44.1       Prior to Admission Medications   Prescriptions Last Dose Informant Patient Reported? Taking?   albuterol-ipratropium (DUO-NEB) 2.5 mg-0.5 mg/3 ml nebu   No No   Sig: 3 mL by Nebulization route every six (6) hours as needed.   chlordiazePOXIDE (LIBRIUM) 10 mg capsule   No No   Sig: Take 1 Cap by mouth two (2) times a day for 3 days. Max Daily Amount: 20 mg.   cloNIDine (CATAPRES) 0.2 mg/24 hr patch   No No   Sig: 1 Patch by TransDERmal route every seven (7) days.   levETIRAcetam (KEPPRA) 500 mg tablet   No No   Sig: Take 1 Tab by mouth two (2) times a day.   magnesium oxide (MAG-OX) 400 mg tablet   No No   Sig: Take 1 Tab by mouth two (2) times a day.   nicotine (NICODERM CQ) 21 mg/24 hr   No No   Sig: 1 Patch by TransDERmal route daily for 30 days.   scopolamine (TRANSDERM-SCOP) 1.5 mg (1 mg over 3 days) pt3d   No No   Sig: 1 Patch by TransDERmal route every seventy-two (72) hours.       Facility-Administered Medications: None       Past Medical History   Diagnosis Date   ??? 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  Found to have GI bleeding secondary to gastric ullcers.   ??? Gastric ulcer September, 2014     Found at EGD to have major gastric ulcers.     Past Surgical History   Procedure Laterality Date   ??? Pr neurological procedure unlisted     ??? Hx back surgery       x 2   ??? Hx endoscopy  September, 2014     Major gastric ulcer     History     Social History   ??? Marital Status: SINGLE     Spouse Name: N/A   ??? Number of Children: N/A   ??? Years of Education: N/A     Occupational History   ??? Disabled Not Employed     disabled secondary to back injury     Social History Main Topics   ??? Smoking status: Current  Every Day Smoker -- 1.00 packs/day for 40 years     Types: Cigarettes   ??? Smokeless tobacco: Never Used   ??? Alcohol Use: Yes   ??? Drug Use: 2.00 per week      Comment: marijuana    ??? Sexual Activity: Not on file     Other Topics Concern   ??? Not on file     Social History Narrative    Lives with roommate    Previously employed in tree cutting - currently on disability with history of back injury and surgery x 2                 Family History   Problem Relation Age of Onset   ??? Heart Disease Mother      CHF     Allergies   Allergen Reactions   ??? Codeine Rash       Current Facility-Administered Medications   Medication Dose Route Frequency   ??? albuterol (PROVENTIL VENTOLIN) nebulizer solution 10 mg  10 mg Nebulization NOW   ??? LORazepam (ATIVAN) injection 2 mg  2 mg IntraVENous Q2H PRN     Current Outpatient Prescriptions   Medication Sig   ??? chlordiazePOXIDE (LIBRIUM) 10 mg capsule Take 1 Cap by mouth two (2) times a day for 3 days. Max Daily Amount: 20 mg.   ??? levETIRAcetam (KEPPRA) 500 mg tablet Take 1 Tab by mouth two (2) times a day.   ??? scopolamine (TRANSDERM-SCOP) 1.5 mg (1 mg over 3 days) pt3d 1 Patch by TransDERmal route every seventy-two (72) hours.    ??? cloNIDine (CATAPRES) 0.2 mg/24 hr patch 1 Patch by TransDERmal route every seven (7) days.   ??? albuterol-ipratropium (DUO-NEB) 2.5 mg-0.5 mg/3 ml nebu 3 mL by Nebulization route every six (6) hours as needed.   ??? magnesium oxide (MAG-OX) 400 mg tablet Take 1 Tab by mouth two (2) times a day.   ??? nicotine (NICODERM CQ) 21 mg/24 hr 1 Patch by TransDERmal route daily for 30 days.       Objective:     Filed Vitals:    03/20/15 1912 03/20/15 2012 03/20/15 2107 03/20/15 2152   BP:   111/62    Pulse:  95 87 91   Temp:       Resp:       Height:       Weight:       SpO2: 96% 92% 94% 94%       PHYSICAL EXAM     Constitutional:  the patient is well developed and in no acute distress  EENMT:  Sclera clear, pupils equal, oral mucosa moist  Respiratory: Mild B rhonchi. No wheeze.  Cardiovascular:  RRR without M,G,R  Gastrointestinal: soft and non-tender; with positive bowel sounds.  Musculoskeletal: warm without cyanosis. There is no lower leg edema.  Skin:  no jaundice or rashes, no wounds   Neurologic: no gross neuro deficits     Psychiatric:  alert and oriented x 0    Chest Xray:  03/20/15  FINDINGS: Positioning of the patient obscures the right lung apex. Bullous  changes are present in the lower lobes. There is no lobar consolidation.      Recent Labs      03/20/15   1704   WBC  12.8*   HGB  13.9   HCT  39.7*   PLT  292     Recent Labs      03/20/15   1704  NA  133*   K  4.5   CL  101   GLU  107*   CO2  25   BUN  10   CREA  0.81   CA  8.3   ALB  2.6*   TBILI  0.6   ALT  104*   SGOT  51*     Recent Labs      03/20/15   1855   PH  7.40   PCO2  38   PO2  68*   HCO3  23     Recent Labs      03/20/15   1855   LCAD  1.3       Assessment:  (Medical Decision Making)     Hospital Problems  Date Reviewed: 03/20/2015          Codes Class Noted POA    * (Principal)COPD exacerbation (HCC) ICD-10-CM: J44.1  ICD-9-CM: 491.21  03/20/2015 Unknown        Delirium tremens (HCC) ICD-10-CM: Q59.563F10.231  ICD-9-CM: 291.0  02/25/2015 Yes         Bullous emphysema- severe (Chronic) ICD-10-CM: J43.9  ICD-9-CM: 492.0  10/15/2013 Yes        Hepatitis C (Chronic) ICD-10-CM: B19.20  ICD-9-CM: 070.70  08/31/2013 Yes        Alcohol abuse (Chronic) ICD-10-CM: F10.10  ICD-9-CM: 305.00  08/28/2013 Yes            I do not believe COPD exacerbation is his major problem. His altered mental status, likely from ETOH, is his biggest issue at present.   He is satting 87% on RA and sounds rhonchorous. He had issues with aspiration last hospitalization.     Plan:  (Medical Decision Making)     --Will admit for further medical management to remote tele  --Supplemental O2   --Respiratory nebulizer treatments  --want to minimize steroid therapy so as not to worsen any agitation he may have. Will start with prednisone 40 but taper quickly if he continues not to wheeze.   --antibiotic therapy: levaquin to cover bronchitis.   --npo for now and speech consult placed  --consult hospitalists to manage ETOH W/d. CIWA protocol ativan ordered for now.     More than 50% of the time documented was spent in face-to-face contact with the patient and in the care of the patient on the floor/unit where the patient is located.    Cecilio Asperravis J Geena Weinhold, MD

## 2015-03-20 NOTE — ED Provider Notes (Addendum)
HPI Comments: Patient presents to the ER from nursing facility after reported cough for a couple days.  Patient has a known history of alcoholism and they're concerned that he possibly may be going through DTs..  There is no reported fever.  Patient does have some mild confusion does report cough for 2 days.  States it has been productive of sputum.    Patient is a 54 y.o. male presenting with cough. The history is provided by the patient, the nursing home and the EMS personnel. The history is limited by the condition of the patient.   Cough  This is a new problem. The current episode started 2 days ago. The problem occurs constantly. The problem has not changed since onset.There has been no fever. Associated symptoms include shortness of breath, wheezing and confusion. Pertinent negatives include no chest pain, no eye redness, no ear congestion, no headaches, no nausea and no vomiting. He has tried nothing for the symptoms. He is not a smoker. Past medical history comments: alcoholism.        Past Medical History:   Diagnosis Date   ??? 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  Found to have GI bleeding secondary to gastric ullcers.   ??? Gastric ulcer September, 2014     Found at EGD to have major gastric ulcers.       Past Surgical History:   Procedure Laterality Date   ??? Pr neurological procedure unlisted     ??? Hx back surgery       x 2   ??? Hx endoscopy  September, 2014     Major gastric ulcer         Family History:   Problem Relation Age of Onset   ??? Heart Disease Mother      CHF       History     Social History   ??? Marital Status: SINGLE     Spouse Name: N/A   ??? Number of Children: N/A   ??? Years of Education: N/A     Occupational History   ??? Disabled Not Employed     disabled secondary to back injury     Social History Main Topics   ??? Smoking status: Current Every Day Smoker -- 1.00 packs/day for 40 years     Types: Cigarettes   ??? Smokeless tobacco: Never Used    ??? Alcohol Use: Yes   ??? Drug Use: 2.00 per week      Comment: marijuana    ??? Sexual Activity: Not on file     Other Topics Concern   ??? Not on file     Social History Narrative    Lives with roommate    Previously employed in tree cutting - currently on disability with history of back injury and surgery x 2                       ALLERGIES: Codeine      Review of Systems   Constitutional: Positive for fatigue. Negative for fever.   HENT: Negative for dental problem, trouble swallowing and voice change.    Eyes: Negative for redness.   Respiratory: Positive for cough, shortness of breath and wheezing. Negative for chest tightness.    Cardiovascular: Negative for chest pain and leg swelling.   Gastrointestinal: Negative for nausea, vomiting and abdominal pain.   Endocrine: Negative for polydipsia, polyphagia and polyuria.   Genitourinary:  Negative for urgency.   Musculoskeletal: Negative for joint swelling and gait problem.   Skin: Negative for pallor and rash.   Allergic/Immunologic: Negative for food allergies and immunocompromised state.   Neurological: Positive for tremors. Negative for syncope, speech difficulty, light-headedness and headaches.   Hematological: Negative for adenopathy. Does not bruise/bleed easily.   Psychiatric/Behavioral: Positive for confusion.   All other systems reviewed and are negative.      Filed Vitals:    03/20/15 1701   BP: 130/72   Pulse: 96   Temp: 97.9 ??F (36.6 ??C)   Resp: 20   Height: '5\' 8"'  (1.727 m)   Weight: 51.71 kg (114 lb)   SpO2: 91%            Physical Exam   Constitutional: He appears well-developed and well-nourished. No distress.   HENT:   Head: Normocephalic and atraumatic.   Nose: Nose normal.   Mouth/Throat: Oropharynx is clear and moist. No oropharyngeal exudate.   Eyes: Conjunctivae and EOM are normal. Pupils are equal, round, and reactive to light. Right eye exhibits no discharge. Left eye exhibits no discharge. No scleral icterus.    Neck: Normal range of motion. Neck supple. No JVD present. No tracheal deviation present. No thyromegaly present.   Cardiovascular: Regular rhythm, normal heart sounds and intact distal pulses.  Tachycardia present.  Exam reveals no gallop and no friction rub.    No murmur heard.  Pulmonary/Chest: Effort normal. No stridor. No respiratory distress. He has wheezes. He has rales. He exhibits no tenderness.   Abdominal: Soft. Bowel sounds are normal. He exhibits no distension and no mass. There is no tenderness. There is no rebound and no guarding.   Musculoskeletal: Normal range of motion.   Lymphadenopathy:     He has no cervical adenopathy.   Neurological: He is alert.   Skin: Skin is warm and dry. He is not diaphoretic.   Psychiatric: He is agitated.   Somewhat uncooperative    Nursing note and vitals reviewed.       MDM  Number of Diagnoses or Management Options  Diagnosis management comments: Concern for pneumonia residual aspiration pneumonia given this patient's clinical history of alcoholism  He also has a history of bullous emphysema  Chest x-ray as well as labs to include ABG       Amount and/or Complexity of Data Reviewed  Clinical lab tests: reviewed and ordered  Tests in the radiology section of CPT??: ordered and reviewed    Risk of Complications, Morbidity, and/or Mortality  Presenting problems: high  Diagnostic procedures: moderate  Management options: high    Patient Progress  Patient progress: stable      Procedures      Results Include:    Recent Results (from the past 24 hour(s))   CBC WITH AUTOMATED DIFF    Collection Time: 03/20/15  5:04 PM   Result Value Ref Range    WBC 12.8 (H) 4.3 - 11.1 K/uL    RBC 4.01 (L) 4.23 - 5.67 M/uL    HGB 13.9 13.6 - 17.2 g/dL    HCT 39.7 (L) 41.1 - 50.3 %    MCV 99.0 (H) 79.6 - 97.8 FL    MCH 34.7 (H) 26.1 - 32.9 PG    MCHC 35.0 31.4 - 35.0 g/dL    RDW 11.6 (L) 11.9 - 14.6 %    PLATELET 292 150 - 450 K/uL    MPV 9.4 (L) 10.8 - 14.1 FL  DF AUTOMATED       NEUTROPHILS 92 (H) 43 - 78 %    LYMPHOCYTES 2 (L) 13 - 44 %    MONOCYTES 3 (L) 4.0 - 12.0 %    EOSINOPHILS 3 0.5 - 7.8 %    BASOPHILS 0 0.0 - 2.0 %    IMMATURE GRANULOCYTES 0.2 0.0 - 5.0 %    ABS. NEUTROPHILS 11.7 (H) 1.7 - 8.2 K/UL    ABS. LYMPHOCYTES 0.3 (L) 0.5 - 4.6 K/UL    ABS. MONOCYTES 0.4 0.1 - 1.3 K/UL    ABS. EOSINOPHILS 0.3 0.0 - 0.8 K/UL    ABS. BASOPHILS 0.0 0.0 - 0.2 K/UL    ABS. IMM. GRANS. 0.0 0.0 - 0.5 K/UL   METABOLIC PANEL, COMPREHENSIVE    Collection Time: 03/20/15  5:04 PM   Result Value Ref Range    Sodium 133 (L) 136 - 145 mmol/L    Potassium 4.5 3.5 - 5.1 mmol/L    Chloride 101 98 - 107 mmol/L    CO2 25 21 - 32 mmol/L    Anion gap 7 7 - 16 mmol/L    Glucose 107 (H) 65 - 100 mg/dL    BUN 10 6 - 23 MG/DL    Creatinine 0.81 0.8 - 1.5 MG/DL    GFR est AA >60 >60 ml/min/1.26m    GFR est non-AA >60 >60 ml/min/1.770m   Calcium 8.3 8.3 - 10.4 MG/DL    Bilirubin, total 0.6 0.2 - 1.1 MG/DL    ALT 104 (H) 12 - 65 U/L    AST 51 (H) 15 - 37 U/L    Alk. phosphatase 109 50 - 136 U/L    Protein, total 7.3 6.3 - 8.2 g/dL    Albumin 2.6 (L) 3.5 - 5.0 g/dL    Globulin 4.7 (H) 2.3 - 3.5 g/dL    A-G Ratio 0.6 (L) 1.2 - 3.5     PROCALCITONIN    Collection Time: 03/20/15  5:04 PM   Result Value Ref Range    Procalcitonin 0.1 ng/mL   EKG, 12 LEAD, INITIAL    Collection Time: 03/20/15  6:49 PM   Result Value Ref Range    Systolic BP  mmHg    Diastolic BP  mmHg    Ventricular Rate 94 BPM    Atrial Rate 102 BPM    P-R Interval 176 ms    QRS Duration 94 ms    Q-T Interval 322 ms    QTC Calculation (Bezet) 402 ms    Calculated P Axis 82 degrees    Calculated R Axis 77 degrees    Calculated T Axis 78 degrees    Diagnosis       !! AGE AND GENDER SPECIFIC ECG ANALYSIS !!  Sinus tachycardia  Nonspecific ST abnormality  Abnormal ECG  When compared with ECG of 24-Feb-2015 22:56,  No significant change was found  Confirmed by MCCOTTER  MD (UC), CRAIG J ([id number]) on 03/20/2015 9:26:20 PM     BLOOD GAS, ARTERIAL     Collection Time: 03/20/15  6:55 PM   Result Value Ref Range    pH 7.40 7.35 - 7.45      PCO2 38 35.0 - 45.0 mmHg    PO2 68 (L) 75.0 - 100.0 mmHg    BICARBONATE 23 22.0 - 26.0 mmol/L    BASE DEFICIT 1.2 0 - 2 mmol/L    TOTAL HEMOGLOBIN 15.1 (H) 11.7 - 15.0 GM/DL    O2 SAT 93  92.0 - 98.5 %    ARTERIAL O2 HGB 92.3 (L) 94.0 - 97.0 %    CARBOXYHEMOGLOBIN 0.6 0.5 - 1.5 %    METHEMOGLOBIN 0.4 0.0 - 1.5 %    DEOXYHEMOGLOBIN 7 (H) 0.0 - 5.0 %    SITE RB     ALLENS TEST NA     MODE NC     O2 FLOW 3.00 L/min   LACTIC ACID - RESPIRATORY    Collection Time: 03/20/15  6:55 PM   Result Value Ref Range    Lactic acid 1.3 0.4 - 2.0 MMOL/L       9:47 PM  Wheezing has improved, Oxygen sat still decreased when taking off of supplemental oxygen, will discuss with pulmonary

## 2015-03-20 NOTE — ED Notes (Signed)
This RN entered patient's room. Pt had pulled out both IV. Pt removed oxygen. Dr. [name] aware.

## 2015-03-20 NOTE — ED Notes (Addendum)
Reports he needed to get out of the nursing home.  Patient states he just wants to lay down and go to sleep.  [facility]  EMS reports patient is probably in DT"s. EMS reports nursing home state patient has cough.

## 2015-03-20 NOTE — ED Notes (Signed)
Pt cleaned of soiled linens. Pt urinated in bed and on floor.

## 2015-03-21 LAB — EKG, 12 LEAD, INITIAL
Atrial Rate: 102 {beats}/min
Calculated P Axis: 82 degrees
Calculated R Axis: 77 degrees
Calculated T Axis: 78 degrees
P-R Interval: 176 ms
Q-T Interval: 322 ms
QRS Duration: 94 ms
QTC Calculation (Bezet): 402 ms
Ventricular Rate: 94 {beats}/min

## 2015-03-21 LAB — AMMONIA: Ammonia, plasma: 18 umol/L (ref 11–32)

## 2015-03-21 MED ORDER — WATER FOR INJECTION, STERILE INJECTION
20 mg/mL (final conc.) | INTRAMUSCULAR | Status: AC
Start: 2015-03-21 — End: 2015-03-20
  Administered 2015-03-21: 01:00:00 via INTRAMUSCULAR

## 2015-03-21 MED ORDER — CHLORDIAZEPOXIDE 10 MG CAP
10 mg | Freq: Two times a day (BID) | ORAL | Status: DC
Start: 2015-03-21 — End: 2015-03-22
  Administered 2015-03-21 (×2): via ORAL

## 2015-03-21 MED ORDER — SODIUM CHLORIDE 0.9 % IJ SYRG
INTRAMUSCULAR | Status: DC | PRN
Start: 2015-03-21 — End: 2015-03-26

## 2015-03-21 MED ORDER — LORAZEPAM 2 MG/ML IJ SOLN
2 mg/mL | INTRAMUSCULAR | Status: DC | PRN
Start: 2015-03-21 — End: 2015-03-26
  Administered 2015-03-21 – 2015-03-26 (×17): via INTRAVENOUS

## 2015-03-21 MED ORDER — LEVETIRACETAM 500 MG TAB
500 mg | Freq: Two times a day (BID) | ORAL | Status: DC
Start: 2015-03-21 — End: 2015-03-26
  Administered 2015-03-21 – 2015-03-26 (×11): via ORAL

## 2015-03-21 MED ORDER — PREDNISONE 20 MG TAB
20 mg | Freq: Every day | ORAL | Status: AC
Start: 2015-03-21 — End: 2015-03-23
  Administered 2015-03-22 – 2015-03-23 (×2): via ORAL

## 2015-03-21 MED ORDER — SODIUM CHLORIDE 0.9 % IJ SYRG
Freq: Three times a day (TID) | INTRAMUSCULAR | Status: DC
Start: 2015-03-21 — End: 2015-03-26
  Administered 2015-03-21 – 2015-03-26 (×16): via INTRAVENOUS

## 2015-03-21 MED ORDER — CLONIDINE 0.2 MG/24 HR WEEKLY TRANSDERM PATCH
0.2 mg/24 hr | TRANSDERMAL | Status: DC
Start: 2015-03-21 — End: 2015-03-26

## 2015-03-21 MED ORDER — LORAZEPAM 2 MG/ML IJ SOLN
2 mg/mL | INTRAMUSCULAR | Status: AC | PRN
Start: 2015-03-21 — End: 2015-03-21

## 2015-03-21 MED ORDER — PREDNISONE 10 MG TAB
10 mg | Freq: Every day | ORAL | Status: AC
Start: 2015-03-21 — End: 2015-03-25
  Administered 2015-03-24 – 2015-03-25 (×2): via ORAL

## 2015-03-21 MED ORDER — MAGNESIUM OXIDE 400 MG TAB
400 mg | Freq: Two times a day (BID) | ORAL | Status: DC
Start: 2015-03-21 — End: 2015-03-26
  Administered 2015-03-21 – 2015-03-26 (×11): via ORAL

## 2015-03-21 MED ORDER — PREDNISONE 20 MG TAB
20 mg | Freq: Every day | ORAL | Status: DC
Start: 2015-03-21 — End: 2015-03-21
  Administered 2015-03-21: 13:00:00 via ORAL

## 2015-03-21 MED ORDER — IPRATROPIUM-ALBUTEROL 2.5 MG-0.5 MG/3 ML NEB SOLUTION
2.5 mg-0.5 mg/3 ml | Freq: Four times a day (QID) | RESPIRATORY_TRACT | Status: DC | PRN
Start: 2015-03-21 — End: 2015-03-26

## 2015-03-21 MED ORDER — DIAZEPAM 5 MG TAB
5 mg | Freq: Four times a day (QID) | ORAL | Status: DC
Start: 2015-03-21 — End: 2015-03-23
  Administered 2015-03-21 – 2015-03-23 (×7): via ORAL

## 2015-03-21 MED ORDER — LEVOFLOXACIN IN D5W 750 MG/150 ML IV PIGGY BACK
750 mg/150 mL | INTRAVENOUS | Status: DC
Start: 2015-03-21 — End: 2015-03-24
  Administered 2015-03-21 – 2015-03-24 (×4): via INTRAVENOUS

## 2015-03-21 MED ORDER — ENOXAPARIN 40 MG/0.4 ML SUB-Q SYRINGE
40 mg/0.4 mL | Freq: Every day | SUBCUTANEOUS | Status: DC
Start: 2015-03-21 — End: 2015-03-26
  Administered 2015-03-21 – 2015-03-26 (×8): via SUBCUTANEOUS

## 2015-03-21 MED ORDER — NICOTINE 21 MG/24 HR DAILY PATCH
21 mg/24 hr | Freq: Every day | TRANSDERMAL | Status: DC
Start: 2015-03-21 — End: 2015-03-26

## 2015-03-21 MED ORDER — ZIPRASIDONE MESYLATE 20 MG IM SOLR
20 mg/mL (final conc.) | INTRAMUSCULAR | Status: AC
Start: 2015-03-21 — End: 2015-03-21
  Administered 2015-03-21: 07:00:00 via INTRAMUSCULAR

## 2015-03-21 MED FILL — DIAZEPAM 5 MG TAB: 5 mg | ORAL | Qty: 1

## 2015-03-21 MED FILL — CHLORDIAZEPOXIDE 10 MG CAP: 10 mg | ORAL | Qty: 1

## 2015-03-21 MED FILL — LORAZEPAM 2 MG/ML IJ SOLN: 2 mg/mL | INTRAMUSCULAR | Qty: 1

## 2015-03-21 MED FILL — WATER FOR INJECTION, STERILE INJECTION: INTRAMUSCULAR | Qty: 10

## 2015-03-21 MED FILL — LOVENOX 40 MG/0.4 ML SUBCUTANEOUS SYRINGE: 40 mg/0.4 mL | SUBCUTANEOUS | Qty: 0.4

## 2015-03-21 MED FILL — MAGNESIUM OXIDE 400 MG TAB: 400 mg | ORAL | Qty: 1

## 2015-03-21 MED FILL — NICOTINE 21 MG/24 HR DAILY PATCH: 21 mg/24 hr | TRANSDERMAL | Qty: 1

## 2015-03-21 MED FILL — LEVOFLOXACIN IN D5W 750 MG/150 ML IV PIGGY BACK: 750 mg/150 mL | INTRAVENOUS | Qty: 150

## 2015-03-21 MED FILL — CLONIDINE 0.2 MG/24 HR WEEKLY TRANSDERM PATCH: 0.2 mg/24 hr | TRANSDERMAL | Qty: 1

## 2015-03-21 MED FILL — LEVETIRACETAM 500 MG TAB: 500 mg | ORAL | Qty: 1

## 2015-03-21 MED FILL — PREDNISONE 20 MG TAB: 20 mg | ORAL | Qty: 2

## 2015-03-21 NOTE — ED Notes (Signed)
Pt in bed eyes closed respirations even unlabored.

## 2015-03-21 NOTE — Progress Notes (Signed)
Patient has become very restless, pulled off his incontinent brief, refusing offer of urinal; attempting to get up our of bed with no destination prepared.  Remote telemetry reading normal sinus rhythm with heart rate at 83.  Medicated with 2mg  Ativan IV for agitation. PO medications given, Patient remains NPO waiting a swallow evaluation.   A second magnetic falls precaution alarm placed on Patient.  Will continue to monitor.

## 2015-03-21 NOTE — Progress Notes (Signed)
Received pt from ED admitted for COPD with non-productive cough and Alcohol withdrawal, with altered mental status, unable to obtain databasen ongoing DT's, right arm heplock intact and patent. On O2 at 5L/min via NC saturating at 94%. Kept on NPO as ordered. Dual skin assessment checked with Camile, RN, skin is intact, dry flaky skin on bilateral lower extremities. Will continue monitoring.

## 2015-03-21 NOTE — Progress Notes (Signed)
Problem: Nutrition Deficit  Goal: *Optimize nutritional status  Nutrition addendum for 03-21-15  Patient now sp bedside swallow evaluation with diagnosis of moderate oropharyngeal dysphagia w/ needs for the following modifications: Mechanical soft with chopped meats and vegetables and honey thick liquids and no mixed consistencies.  RD is to assign patient sugar free honey thick shakes TID and Magic Cup once per day as tolerated.  Clarita CraneSusan Hubbard,RD,LD [phone]

## 2015-03-21 NOTE — ED Notes (Signed)
Pt in bed respirations even unlabored. Eyes closed. Report to taylor RN.

## 2015-03-21 NOTE — Progress Notes (Addendum)
Following for COPD  RRAT- 15  [employer]    Admit from [facility].  Recent admission for ETOH 02/24/15- 03/17/15- consult Hospitalist.   [facility] regarding return at discharge.     Jerrel IvoryJulie Izzak Fries, RN- [facility], BSN  COPD Care Transition Facilitator  [phone]

## 2015-03-21 NOTE — Progress Notes (Signed)
TRANSFER - IN REPORT:    Verbal report received from Alphaaylor, RN(name) on Brett Becker  being received from ED(unit) for routine progression of care      Report consisted of patient???s Situation, Background, Assessment and   Recommendations(SBAR).     Information from the following report(s) Kardex, MAR, Recent Results and Cardiac Rhythm NSR was reviewed with the receiving nurse.    Opportunity for questions and clarification was provided.      Assessment completed upon patient???s arrival to unit and care assumed.

## 2015-03-21 NOTE — Progress Notes (Signed)
Patient sitting up in bed with guests at the bedside.  Speech is not as slurred as earlier this morning.  Patient asking for milk and cookies.  Given milk with thickener and graham crackers.  Will continue to monitor.

## 2015-03-21 NOTE — Progress Notes (Signed)
Problem: Dysphagia (Adult)  Goal: *Speech Goal: (INSERT TEXT)  STG: Pt will tolerate mechanical soft textures/honey thick liquids without signs/sx of aspiration with 100% accuracy  STG: Pt will participate with laryngeal exercises x10 with 80% accuracy  STG: Pt will participate with repeat modified barium swallow study (MBS) if/when appropriate  LTG: Pt will tolerate the least restrictive diet at discharge without respiratory compromise  SPEECH LANGUAGE PATHOLOGY: BEDSIDE SWALLOW NOTE: INITIAL ASSESSMENT    NAME/AGE/GENDER: Brett Becker is a 54 y.o. male  DATE: 03/21/2015  PRIMARY DIAGNOSIS: COPD exacerbation (HCC)  COPD exacerbation (HCC)       ICD-10: Treatment Diagnosis: dysphagia; oropharyngeal R13.12  INTERDISCIPLINARY COLLABORATION: Registered Nurse  PRECAUTIONS/ALLERGIES: Codeine   ASSESSMENT:   Patient known to ST from very recent admission.  Discharged on mechanical soft/honey thick liquids.  Patient had a blanket over his head but when removed he woke up immediately and participated.  Confused and oriented x1.  Continued signs/sx of aspiration at bedside as evidenced by immediate cough with thin liquids.  Patient tolerated honey thick liquids by cup and straw without overt signs/sx of aspiration.  Minimal lingual residue following the cracker which was cleared with a liquid wash.  Limited dentition with prolonged mastication.  Recommend mechanical soft textures/honey thick liquids.  No mixed consistencies.  Will follow for dysphagia management.  Patient was able to follow directions for laryngeal exercises previous admission.      ????????This section established at most recent assessment??????????  PROBLEM LIST (Impairments causing functional limitations):  1. dysphagia  REHABILITATION POTENTIAL FOR STATED GOALS: FAIR      PLAN OF CARE:   Patient will benefit from skilled intervention to address the following impairments.  RECOMMENDATIONS AND PLANNED INTERVENTIONS (Benefits and precautions of  therapy have been discussed with the patient.):  ?? PO:  Mechanical soft with chopped meat and vegetables  ?? Liquids:  honey  MEDICATIONS:  ?? With Thickened Liquid  COMPENSATORY STRATEGIES/MODIFICATIONS INCLUDING:  ?? Small sips and bites  OTHER RECOMMENDATIONS (including follow up treatment recommendations):   ?? Family training/education  ?? Laryngeal exercises  ?? Patient education  RECOMMENDED DIET MODIFICATIONS DISCUSSED WITH:  ?? Nursing  ?? Patient  FREQUENCY/DURATION: Continue to follow patient 3-5x a week to address above goals.   RECOMMENDED REHABILITATION/EQUIPMENT: (at time of discharge pending progress):   to be determined.      SUBJECTIVE:   Confused.  History of Present Injury/Illness: Brett. Becker  has a past medical history of Delirium tremens [facility]) (September, 2014) and Gastric ulcer (September, 2014).  He also  has past surgical history that includes neurological procedure unlisted; back surgery; and endoscopy (September, 2014).  Present Symptoms:   Pain Intensity 1: 0  Current Medications:   No current facility-administered medications on file prior to encounter.       Current Outpatient Prescriptions on File Prior to Encounter   Medication Sig Dispense Refill   ??? levETIRAcetam (KEPPRA) 500 mg tablet Take 1 Tab by mouth two (2) times a day. 60 Tab 0   ??? scopolamine (TRANSDERM-SCOP) 1.5 mg (1 mg over 3 days) pt3d 1 Patch by TransDERmal route every seventy-two (72) hours. 10 Patch 0   ??? cloNIDine (CATAPRES) 0.2 mg/24 hr patch 1 Patch by TransDERmal route every seven (7) days. 4 Patch 0   ??? magnesium oxide (MAG-OX) 400 mg tablet Take 1 Tab by mouth two (2) times a day. 60 Tab 0   ??? [EXPIRED] chlordiazePOXIDE (LIBRIUM) 10 mg capsule Take 1 Cap by mouth two (  2) times a day for 3 days. Max Daily Amount: 20 mg. 6 Cap 0   ??? albuterol-ipratropium (DUO-NEB) 2.5 mg-0.5 mg/3 ml nebu 3 mL by Nebulization route every six (6) hours as needed. 300 mL 0    ??? nicotine (NICODERM CQ) 21 mg/24 hr 1 Patch by TransDERmal route daily for 30 days. 30 Patch 0     Current Dietary Status:  NPO pending consult                 ?? History of reflux:  no  Social History/Home Situation: [facility]  Home Environment: Rehabilitation facility  One/Two Story Residence: Other (Comment)  Living Alone: No  Support Systems: Family member(s) (from [facility])  Patient Expects to be Discharged to:: Rehabilitation facility  Current DME Used/Available at Home: Other (comment) ([employer])      OBJECTIVE:   Respiratory Status:  Nasal cannula  5 l/min  CXR Results:Borderline present in the lower lobes. No lobar consolidation  MRI/CT Results:n/a  Oral Motor Structure/Speech:  Oral-Motor Structure/Motor Speech  Labial: No impairment  Dentition: Limited  Lingual: Decreased rate    Cognitive and Communication Status:  Neurologic State: Alert  Orientation Level: Oriented to person;Disoriented to place;Disoriented to situation;Disoriented to time  Cognition: Impulsive;Memory loss;Impaired decision making     Perseveration: Perseverates during conversation  Safety/Judgement: Decreased awareness of need for safety    BEDSIDE SWALLOW EVALUATION  Oral Assessment:  Oral Assessment  Labial: No impairment  Dentition: Limited  Lingual: Decreased rate  P.O. Trials:       The patient was given tsp to straw amounts of the following:   Consistency Presented: Thin liquid;Honey thick liquid  How Presented: Cup/sip;Spoon;Straw    ORAL PHASE:  Bolus Acceptance: Impaired  Bolus Formation/Control: Impaired  Propulsion: No impairment     Oral Residue: Lingual;Less than 10% of bolus    PHARYNGEAL PHASE:  Initiation of Swallow: Delayed (# of seconds)  Laryngeal Elevation: Decreased  Aspiration Signs/Symptoms: Strong cough  Vocal Quality: No impairment                OTHER OBSERVATIONS:  Rate/bite size: WNL         Endurance:  Questionable              Tool Used: Dysphagia Outcome and Severity Scale (DOSS)      Score Comments   Normal Diet  [ ]  7 With no strategies or extra time needed   Functional Swallow  [ ]  6 May have mild oral or pharyngeal delay        Mild Dysphagia     [ ]  5 Which may require one diet consistency restricted (those who demonstrate penetration which is entirely cleared on MBS would be included)   Mild-Moderate Dysphagia  [ ]  4 With 1-2 diet consistencies restricted        Moderate Dysphagia  [X]  3 With 2 or more diet consistencies restricted        Moderately Severe Dysphagia  [ ]  2 With partial PO strategies (trials with ST only)        Severe Dysphagia  [ ]  1 With inability to tolerate any PO safely           Score:  Initial: 3 Most Recent: X (Date: -- )   Interpretation of Tool: The Dysphagia Outcome and Severity Scale (DOSS) is a simple, easy-to-use, 7-point scale developed to systematically rate the functional severity of dysphagia based on objective assessment and make recommendations for  diet level, independence level, and type of nutrition.       Score Modifier CH CI CJ CK CL CM CN   ?? Swallowing:              Z6109 - CURRENT STATUS:           CL - 60%-79% impaired, limited or restricted              U0454 - GOAL STATUS:                   CJ - 20%-39% impaired, limited or restricted              U9811 - D/C STATUS:                       ---------------To be determined---------------  Payor: ADVICARE MEDICAID / Plan: [place]ICA[place] / Product Type: Managed Care Medicaid /       TREATMENT:               (In addition to Assessment/Re-Assessment sessions the following treatments were rendered)  Assessment/Reassessment only, no treatment provided today  MODALITIES:                                                                    ORAL MOTOR  EXERCISES:                                                                                                                                                                      LARYNGEAL / PHARYNGEAL EXERCISES:                                                                                                                                      __________________________________________________________________________________________________  Safety:   After treatment position/precautions:  ?? RN notified  ?? Upright in Bed  Progression/Medical Necessity:   ?? Skilled intervention continues to be required due to persistent signs and symptoms of aspiration and patient still consuming a modified diet.  Compliance with Program/Exercises: Will assess as treatment progresses.   Reason for Continuation of Services/Other Comments:  ?? Patient continues to require skilled intervention due to dysphagia.  Recommendations/Intent for next treatment session: "Treatment next visit will focus on laryngeal exercises".    Total Treatment Duration:  Time In: 1100  Time Out: 1120    Reginia Naas MS, CCC-SLP

## 2015-03-21 NOTE — Other (Deleted)
Please clarify if this patient is being treated/managed for:    => Underweight in patient with Ht. 5'8'', Wt: 114 lbs, BMI: 17.34, per Dietitian : Underweight  =>Other Explanation of clinical findings  =>Unable to Determine (no explanation of clinical findings)      Please clarify and document your clinical opinion in the progress notes and discharge summary including the definitive and/or presumptive diagnosis, (suspected or probable), related to the above clinical findings. Please include clinical findings supporting your diagnosis.    Thank you    Donnie CoffinSusan O. Roach, RN, CDMP  [phone]

## 2015-03-21 NOTE — Other (Signed)
TRANSFER - OUT REPORT:    Verbal report given to Rocsille(name) on Ina KickJimmy Kreamer  being transferred to 814(unit) for routine progression of care       Report consisted of patient???s Situation, Background, Assessment and   Recommendations(SBAR).     Information from the following report(s) SBAR, Intake/Output, MAR and Cardiac Rhythm NSR was reviewed with the receiving nurse.    Lines:   Peripheral IV 03/20/15 Right (Active)   Site Assessment Clean, dry, & intact 03/20/2015  9:22 PM   Phlebitis Assessment 0 03/20/2015  9:22 PM   Infiltration Assessment 0 03/20/2015  9:22 PM   Dressing Status Clean, dry, & intact 03/20/2015  9:22 PM        Opportunity for questions and clarification was provided.      Patient transported with:   O2 @ 6 liters  Registered Nurse

## 2015-03-21 NOTE — Consults (Signed)
[name]                            [address]                           [place], [place]. [address]                                [phone]                                  CONSULTATION    NAME:  Brett Becker, Brett Becker                               MR:  [id number]  LOC:  [id number]            SEX:  M               ACCT:  [id number]  DOB:  [birth year]            AGE:  54              PT:  I  ADMIT: 03/20/2015           DSCH:                 MSV:        REASON FOR CONSULTATION: Altered mental status, evaluate for DT, alcohol  abuse.    HISTORY OF PRESENT ILLNESS: I am unable to get details and reliable  history from the patient as he has some slight disorientation, but was  able to get most of my information from him and also from chart review  and my discussion with the nurse. He is a 54 year old male patient with  prolonged history of alcohol abuse and also has hepatitis C. He was  admitted under the care of the Pulmonary with presumptive acute COPD  exacerbation and hypoxia. The patient is known to have a history of  tobacco abuse and advanced bullous emphysema. He is being treated for  that. He as a Psychologist, clinical of fact, was recently discharged from the hospital  after he was managed for altered mental status and DTs following an  episode of seizure attack. The seizure was presumed alcohol related, but  he is still on anticonvulsant therapy utilizing Keppra. His MRI recently  did not show any acute stroke, as a matter of fact, his imaging showed  prior history of CVA.    Overnight, the patient was treated with Ativan p.r.n. This morning he was  noted to be agitated and coming off the bed. At the time of my  evaluation, the patient was much more calm and cooperative. He did not  have report to me any complaints. He was alert and oriented to place. No  productive cough. He is short of breath visibly but no fevers or chills.     REVIEW OF SYSTEMS: A 10-organ review of system was conducted and was  negative except for above.    PAST MEDICAL HISTORY:  1. Alcohol abuse.  2. Hypertension.  3. Gastric ulcer.  4. Hepatitis C.  5. Marijuana abuse.  6. Advanced COPD.  7. Hypoxia.  8. History of DTs.  PAST SURGICAL HISTORY: Back surgery.    SOCIAL HISTORY: The patient admits to drinking alcohol for more than 30  years. Recently his average was 6-8 beers a day, each 12 ounces. Also  smokes about a pack a day for similar period, and uses marijuana. His  last alcohol drink was over 2 weeks ago.    FAMILY HISTORY: Reviewed and is noncontributory.    ALLERGIES: CODEINE.    HOME MEDICATIONS: Reviewed. Please see medication reconciliation.  Currently on:    1. Librium.  2. Clonidine.  3. Valium.  4. Lovenox.  5. Keppra.  6. Levothyroxine.  7. Magnesium.  8. Prednisone tapering dose.    PHYSICAL EXAMINATION  VITAL SIGNS: Temperature ranges anywhere from 96.4 to 97.9, blood  pressure ranged anywhere from 111/62 to 149/90, heart rate ranged  anywhere from 71-96, O2 saturation is 91.7.  GENERAL APPEARANCE: The patient is lying on the bed, cachectic and  frail.  HEENT: Sclerae is nonicteric. Oropharynx appears normal except for poor  dentition and dry mucous membranes.  NECK: Supple.  LUNGS: Poor air entry. No wheezing.  HEART: Normal S1, S2. No murmurs.  ABDOMEN: Soft, slightly distended. Bowel sounds were active.  EXTREMITIES: No pitting edema.  MUSCULOSKELETAL: Diffuse muscle wasting.  NEUROLOGIC: He is alert, oriented x2. No focal neurological deficits.  Mild asterixis.  SKIN: Mild spider angiomas digital ________.    LABORATORY DATA: White blood cells 12.8, hemoglobin 15.8, platelets 292,  creatinine 0.81.    ASSESSMENT:    1. Altered mental status in a patient with known alcohol abuse. He is  being treated now for presumptive delirium tremens.  2. Advanced chronic obstructive pulmonary disease with hypoxia. It   appears that he is in mild to moderate flare of his COPD on tapering dose  of steroids.  3. Known history of hepatitis C.  4. Known history of alcohol abuse. Last alcohol drink was 2 weeks ago.  5. History of cerebrovascular accident.    IMPRESSION AND PLAN: The patient's altered mental status at this  stage, may not be necessarily secondary to delirium tremens, his alcohol  drink was more than 10 days ago. He does not particularly have any  tachycardia. No focal neurological deficits to suggest CVA. I will check  ammonia level, so as to make sure he does not have any hepatic  encephalopathy. Would cautiously use administration of the  benzodiazepines and treat underlying COPD and hypoxia, which may in  itself contribute to some sort of delirium. Physical therapy evaluation  and we will continue to follow up with you. Note, the patient was here in  the hospital, was recently discharged to alcohol rehabilitation facility.  His last alcohol drink was more than 10 days ago prior to his last  admission. He is certainly outside the zone for developing DTs    Thank you for letting his participate in the care of this patient. We  will wish him a speedy recovery and will continue to follow up with him.                Brett FudgeJihad Omer Laryah Neuser, MD                This is an unverified document unless signed by physician.    TID:  [id number]  DT:  03/21/2015 01:10 P  JOB:  [id number]           DOC#:  [id number]           DD:  03/21/2015    cc:   Brett Fudge, MD

## 2015-03-21 NOTE — Progress Notes (Signed)
Morning assessment completed.  Patient resting in bed with open eyes, wrapped in blankets, demonstrating no signs of dyspnea or distress on 4L oxygen via nasal cannula.  Demonstrating no agitation at this time.  Speech is slurred and incomprehensible.  IV antibiotic completed, hepwell flushed and wrapped with Kerlix to prevent the Patient from pulling out his lines.  Incontinent brief on.  Will continue to monitor.

## 2015-03-21 NOTE — Progress Notes (Signed)
Pt agitated, restless, confused. Attempting to swing backside over upper side rail and go home. Ativan administered via slow IV push. Posey pad placed, activated and audible. Tab alert to gown. Will continue to monitor.

## 2015-03-21 NOTE — Progress Notes (Signed)
Problem: Nutrition Deficit  Goal: *Optimize nutritional status  Nutrition    MST (malnutrition screen tool ) referral for reason of :  Recently lost weight loss w/o trying:    [x]   Unsure  If weight loss, how much?   [blank ] lbs     Eating poorly due to decreased appetite?  [x]  yes   Assessment:  RD notes:  RN assesses pt is free of skin breakdown.    RD has reviewed connect cre weight database for the following. Based on connect care functionality, RD cannot discern if the weights are actual, stated, or estimated.  Vitals 03/21/2015 03/20/2015 03/17/2015 02/25/2015 03/16/2014   Systolic 101   111   142   Diastolic 80   68   95   Pulse 77   76   100   Temp 97.1   98.6   98.5   Respirations 18   18   18    Weight   114   114.5 125   Height   5\' 8"    5\' 8"  5\' 8"    BMI   17.34   17.41 19.01   SpO2 92   92   96       Vitals 10/16/2013 09/07/2013 08/28/2013   Systolic 110 109     Diastolic 74 70     Pulse 91 61     Temp 98.4 98     Respirations 16 16     Weight 124   121.69   Height 5' 7.5"   5\' 8"    BMI 19.12   18.51   SpO2 95 96     RD conclusion is that pt has been underweight chronically. If pt actually weighed 125 lbs 03/16/14 this weight loss  (8/.8%) is not clinically significant for a year period of time. To be clinically significant, pt would have had to have lost 20% UBW which is not the case here.  Pt /Nutrition Hx:   54 yo M admitted with COPD exacerbation, DT's . PMH: ETOH abuse, Hep C, severe bullous emphysema  Pertinent medications:  Librium, Valium, Keppra, Ativan, Prednisone, Nicoderm  Anthropometrics:              Ht = 5'8", wt = 51.71 kgs (weight source: unknown)   BMI of 17.4 c/w underweight designation .  Macronutrient Needs:              EER: 1551-1810 calories per day based on 30-35 calories per kg recorded pt weight. (? 74 % IBW) c/w moderate somatic protein depletion.              EPR: 62-88 grams per day based on 1.2-1.7 grams of protein per kg of recorded pt wt (GFR > 60 ml/min  )  Lab Results    Component Value Date/Time     POTASSIUM 4.5 03/20/2015 05:04 PM     SODIUM 133 03/20/2015 05:04 PM     PHOSPHORUS 3.1 03/17/2015 05:47 AM     MAGNESIUM 1.4 03/17/2015 05:47 AM     GLUCOSE 107 03/20/2015 05:04 PM     CREATININE 0.81 03/20/2015 05:04 PM     TRIGLYCERIDE 49 03/05/2015 05:45 AM     Intake/Comparative Standards:   NPO. This diet type will not provide for estimated calorie and protein needs of patient.  Nutrition Diagnosis:   Inadequate oral intake related to decreased ability to consume sufficient energy as evidenced by sequela of COPD exacerbation and DT's from hx of ETOH abuse.  Intervention:  Plan:   >   Meals and snacks: NPO until dysphagia screen completed by SLP.    >   Nutrition Supplement Therapy: Once patient is on a full liquid or greater diet type, RD to provide pt with Ensure Enlive TID. Marland Kitchen   >   May be beneficial to offer patient daily multivitamin.  >   May be beneficial to weigh this patient if weight on EMR was not obtained here post admit.  Gwen Pounds RD,LD  [phone]

## 2015-03-21 NOTE — Progress Notes (Addendum)
Ina KickJimmy Hsu  Admission Date: 03/20/2015             Daily Progress Note: 03/21/2015    The patient's chart is reviewed and the patient is discussed with the staff.    54 yo male with a history of HTN, marijuana abuse, ETOH abuse, hep C, COPD, and severe bullous emphysema.  She was admitted with AMS - discharged to [facility] 3 days before current admission following hospitalization for ETOH withdrawal.  He was hypoxic with a RA sat of 87% on admission    Subjective:     O2 off prior to exam with RA sat 92%.  Patient remains somewhat confused.      Current Facility-Administered Medications   Medication Dose Route Frequency   ??? albuterol-ipratropium (DUO-NEB) 2.5 MG-0.5 MG/3 ML  3 mL Nebulization Q6H PRN   ??? chlordiazePOXIDE (LIBRIUM) capsule 10 mg  10 mg Oral BID   ??? cloNIDine (CATAPRES) 0.2 mg/24 hr patch 1 Patch  1 Patch TransDERmal Q7D   ??? levETIRAcetam (KEPPRA) tablet 500 mg  500 mg Oral BID   ??? magnesium oxide (MAG-OX) tablet 400 mg  400 mg Oral BID   ??? nicotine (NICODERM CQ) 21 mg/24 hr patch 1 Patch  1 Patch TransDERmal DAILY   ??? sodium chloride (NS) flush 5-10 mL  5-10 mL IntraVENous Q8H   ??? sodium chloride (NS) flush 5-10 mL  5-10 mL IntraVENous PRN   ??? diazepam (VALIUM) tablet 5 mg  5 mg Oral Q6H   ??? LORazepam (ATIVAN) injection 1 mg  1 mg IntraVENous Q15MIN PRN   ??? levofloxacin (LEVAQUIN) 750 mg in D5W IVPB  750 mg IntraVENous Q24H   ??? predniSONE (DELTASONE) tablet 40 mg  40 mg Oral DAILY WITH BREAKFAST   ??? LORazepam (ATIVAN) injection 2 mg  2 mg IntraVENous Q2H PRN       Review of Systems  Unobtainable due to patient status.    Objective:     Filed Vitals:    03/21/15 0305 03/21/15 0325 03/21/15 0520 03/21/15 0758   BP:   119/92 101/80   Pulse: 71 71 75 77   Temp:   96.4 ??F (35.8 ??C) 97.1 ??F (36.2 ??C)   Resp: 27 27 24 18    Height:       Weight:       SpO2: 94% 91% 94% 92%     Intake and Output:           Physical Exam:    Constitution:  the patient is well developed and in no acute distress  EENMT:  Sclera clear, pupils equal, oral mucosa moist  Respiratory: diminished bilaterally, RA  Cardiovascular:  RRR without M,G,R  Gastrointestinal: soft and non-tender; with positive bowel sounds.  Musculoskeletal: warm without cyanosis. There is no lower leg edema.  Skin:  no jaundice or rashes, no open wounds   Neurologic: no gross neuro deficits     Psychiatric:  alert and oriented x 1    CHEST XRAY:   4/7          LAB   Recent Labs      03/20/15   1704   WBC  12.8*   HGB  13.9   HCT  39.7*   PLT  292     Recent Labs      03/20/15   1704   NA  133*   K  4.5   CL  101   CO2  25   GLU  107*   BUN  10   CREA  0.81   CA  8.3   ALB  2.6*   TBILI  0.6   ALT  104*   SGOT  51*     Recent Labs      03/20/15   1855   PH  7.40   PCO2  38   PO2  68*   HCO3  23     Recent Labs      03/20/15   1855   LCAD  1.3         Assessment:  (Medical Decision Making)     Hospital Problems  Date Reviewed: 03-29-15          Codes Class Noted POA    Hypoxia ICD-10-CM: R09.02  ICD-9-CM: 799.02  2015-03-29 Yes    Currently on RA with o2 sat 92%      * (Principal)COPD exacerbation (HCC) ICD-10-CM: J44.1  ICD-9-CM: 491.21  03/20/2015 Unknown    No active wheezing  Taper steroids      Delirium tremens (HCC) ICD-10-CM: Z61.096  ICD-9-CM: 291.0  02/25/2015 Yes    Ongoing  Recent admission per Hospitalists - consult pending to assist with DT management  Librium / valium / keppra      Bullous emphysema- severe (Chronic) ICD-10-CM: J43.9  ICD-9-CM: 492.0  10/15/2013 Yes    Severe      Hepatitis C (Chronic) ICD-10-CM: B19.20  ICD-9-CM: 070.70  08/31/2013 Yes        Alcohol abuse (Chronic) ICD-10-CM: F10.10  ICD-9-CM: 305.00  08/28/2013 Yes         Micro:  4/7 BC: NGTD x 2    Plan:  (Medical Decision Making)     --LVQ   WBC 12.8   procalcitonin 0.1  --prednisone 40 mg daily >>> 20 mg x 2 days then 10 mg x 2 days then stop  --prn duo-neb  --NPO - possible aspiration.  SLP consulted   --Hospitalists consulted for ETOH withdrawal management    More than 50% of the time documented was spent in face-to-face contact with the patient and in the care of the patient on the floor/unit where the patient is located.    Glo Herring, NP        Lungs: mild rhonchi and on RA now with saturation at 92%  Heart S1 and S2 audible, no murmers or rubs appreciated  Other    Rapid taper to steroids. Pulmonary issues are not major issues. ETOH and possible aspirating are. Will see when hospitalist f/u if they can place on their service.    I have spoken with and examined the patient. I have reviewed the history, examination, assessment, and plan and agree with the above.    Coe Angelos Mcarthur Rossetti, MD      This note was signed electronically.   Errors are unfortunately her likely due to dictation software.

## 2015-03-22 MED ORDER — QUETIAPINE 25 MG TAB
25 mg | Freq: Two times a day (BID) | ORAL | Status: DC
Start: 2015-03-22 — End: 2015-03-24
  Administered 2015-03-22 – 2015-03-23 (×5): via ORAL

## 2015-03-22 MED ORDER — THIAMINE HCL 100 MG TAB
100 mg | Freq: Every day | ORAL | Status: DC
Start: 2015-03-22 — End: 2015-03-26
  Administered 2015-03-22 – 2015-03-26 (×5): via ORAL

## 2015-03-22 MED ORDER — HALOPERIDOL LACTATE 5 MG/ML IJ SOLN
5 mg/mL | Freq: Once | INTRAMUSCULAR | Status: AC
Start: 2015-03-22 — End: 2015-03-22

## 2015-03-22 MED ORDER — QUETIAPINE 25 MG TAB
25 mg | Freq: Every evening | ORAL | Status: DC
Start: 2015-03-22 — End: 2015-03-22

## 2015-03-22 MED FILL — VITAMIN B-1 100 MG TABLET: 100 mg | ORAL | Qty: 1

## 2015-03-22 MED FILL — CHLORDIAZEPOXIDE 10 MG CAP: 10 mg | ORAL | Qty: 1

## 2015-03-22 MED FILL — LOVENOX 40 MG/0.4 ML SUBCUTANEOUS SYRINGE: 40 mg/0.4 mL | SUBCUTANEOUS | Qty: 0.4

## 2015-03-22 MED FILL — DIAZEPAM 5 MG TAB: 5 mg | ORAL | Qty: 1

## 2015-03-22 MED FILL — QUETIAPINE 25 MG TAB: 25 mg | ORAL | Qty: 1

## 2015-03-22 MED FILL — PREDNISONE 20 MG TAB: 20 mg | ORAL | Qty: 1

## 2015-03-22 MED FILL — LEVOFLOXACIN IN D5W 750 MG/150 ML IV PIGGY BACK: 750 mg/150 mL | INTRAVENOUS | Qty: 150

## 2015-03-22 MED FILL — LORAZEPAM 2 MG/ML IJ SOLN: 2 mg/mL | INTRAMUSCULAR | Qty: 1

## 2015-03-22 MED FILL — LEVETIRACETAM 500 MG TAB: 500 mg | ORAL | Qty: 1

## 2015-03-22 MED FILL — MAGNESIUM OXIDE 400 MG TAB: 400 mg | ORAL | Qty: 1

## 2015-03-22 MED FILL — NICOTINE 21 MG/24 HR DAILY PATCH: 21 mg/24 hr | TRANSDERMAL | Qty: 1

## 2015-03-22 NOTE — Progress Notes (Signed)
Hospitalist Progress Note    Subjective:   Daily Progress Note: 03/22/2015 9:39 AM    [name] is a [age] year old white male, with a pmh of alcohol and tobacco abuse, who presetned 4/7 to the pulmonary service for a COPD exacerbation. Hospitalist service was consulted for acute encephalopathy. No believed to be DTs as last drink was about 11 days ago. Blood cultrues with no growth to date. Currently on room air.  ABG on 4/7 with no impressive hypoxia or hypercapnea. He waxes and wanes, at times being lucid. Right now he is disoriented, having to be restrained, won't take any of his PO medications.  He knows he is at [facility].      Nursing notes and chart reviewed. No family currently at bedside.     Current Facility-Administered Medications   Medication Dose Route Frequency   ??? thiamine (B-1) tablet 50 mg  50 mg Oral DAILY   ??? QUEtiapine (SEROquel) tablet 50 mg  50 mg Oral QHS   ??? albuterol-ipratropium (DUO-NEB) 2.5 MG-0.5 MG/3 ML  3 mL Nebulization Q6H PRN   ??? cloNIDine (CATAPRES) 0.2 mg/24 hr patch 1 Patch  1 Patch TransDERmal Q7D   ??? levETIRAcetam (KEPPRA) tablet 500 mg  500 mg Oral BID   ??? magnesium oxide (MAG-OX) tablet 400 mg  400 mg Oral BID   ??? nicotine (NICODERM CQ) 21 mg/24 hr patch 1 Patch  1 Patch TransDERmal DAILY   ??? sodium chloride (NS) flush 5-10 mL  5-10 mL IntraVENous Q8H   ??? sodium chloride (NS) flush 5-10 mL  5-10 mL IntraVENous PRN   ??? diazepam (VALIUM) tablet 5 mg  5 mg Oral Q6H   ??? levofloxacin (LEVAQUIN) 750 mg in D5W IVPB  750 mg IntraVENous Q24H   ??? predniSONE (DELTASONE) tablet 20 mg  20 mg Oral DAILY WITH BREAKFAST   ??? [START ON 03/24/2015] predniSONE (DELTASONE) tablet 10 mg  10 mg Oral DAILY WITH BREAKFAST   ??? enoxaparin (LOVENOX) injection 40 mg  40 mg SubCUTAneous DAILY   ??? LORazepam (ATIVAN) injection 2 mg  2 mg IntraVENous Q2H PRN        Review of Systems  Patient will not answer my questions- continues to say "I'm stressed out, y'all are stressing me out!"    Objective:      BP 120/74 mmHg   Pulse 68   Temp(Src) 97.3 ??F (36.3 ??C)   Resp 18   Ht 5\' 8"  (1.727 m)   Wt 51.71 kg (114 lb)   BMI 17.34 kg/m2   SpO2 91% O2 Flow Rate (L/min): 0 l/min O2 Device: Room air    Temp (24hrs), Avg:97.4 ??F (36.3 ??C), Min:97.3 ??F (36.3 ??C), Max:97.5 ??F (36.4 ??C)         04/07 1901 - 04/09 0700  In: 240 [P.O.:240]  Out: -     General: awake, alert, oriented to place but speech somewhat slurred and he is agitated  Eyes: non icteric, EOMI  Neck: supple, no rigidity  CV: RRR  PULM: course breath sounds, mild wheezing, non labored  Abd: soft, non tender, non distended    Additional comments:  All labs, notes and studies from the past 24 hours have been personally reviewed today by me.     Data Review    Recent Results (from the past 24 hour(s))   AMMONIA    Collection Time: 03/21/15 12:55 PM   Result Value Ref Range    Ammonia 18 11 -  32 UMOL/L         Assessment/Plan:     Principal Problem:    COPD exacerbation (HCC) (03/20/2015)    Active Problems:    Alcohol abuse (08/28/2013)      Hepatitis C (08/31/2013)      Bullous emphysema- severe (10/15/2013)      Delirium tremens (HCC) (02/25/2015)      Hypoxia (03/21/2015)    haldol 5 mg IV x one- will then attempt to give his PO medications,.   On scheduled valium and librium- stop librium.   Check UA.  Blood cultures negative.   Lactic acid and ammonia levels within normal limits.   Add daily thiamine.   Start seroquel 25 mg BID, check EKG for QTc evaluation tomorrow morning.     Care Plan discussed with: the patient, RN, his care team.

## 2015-03-22 NOTE — Progress Notes (Signed)
Entered the room to pt trying to get out of bed, when assisting pt back into bed the pt became combative, pt was then restrained and Doctor notified. Order obtained, pt in bed screaming " officer".

## 2015-03-22 NOTE — Progress Notes (Signed)
Brett Becker  Admission Date: 03/20/2015             Daily Progress Note: 03/22/2015    The patient's chart is reviewed and the patient is discussed with the staff.    54 yo male with a history of HTN, marijuana abuse, ETOH abuse, hep C, COPD, and severe bullous emphysema.  She was admitted with AMS - discharged to [facility] 3 days before current admission following hospitalization for ETOH withdrawal.  He was hypoxic with a RA sat of 87% on admission    Subjective:     Remains confused, in restrains  On RA    Current Facility-Administered Medications   Medication Dose Route Frequency   ??? thiamine (B-1) tablet 50 mg  50 mg Oral DAILY   ??? QUEtiapine (SEROquel) tablet 25 mg  25 mg Oral BID   ??? haloperidol lactate (HALDOL) injection 5 mg  5 mg IntraVENous ONCE   ??? albuterol-ipratropium (DUO-NEB) 2.5 MG-0.5 MG/3 ML  3 mL Nebulization Q6H PRN   ??? cloNIDine (CATAPRES) 0.2 mg/24 hr patch 1 Patch  1 Patch TransDERmal Q7D   ??? levETIRAcetam (KEPPRA) tablet 500 mg  500 mg Oral BID   ??? magnesium oxide (MAG-OX) tablet 400 mg  400 mg Oral BID   ??? nicotine (NICODERM CQ) 21 mg/24 hr patch 1 Patch  1 Patch TransDERmal DAILY   ??? sodium chloride (NS) flush 5-10 mL  5-10 mL IntraVENous Q8H   ??? sodium chloride (NS) flush 5-10 mL  5-10 mL IntraVENous PRN   ??? diazepam (VALIUM) tablet 5 mg  5 mg Oral Q6H   ??? levofloxacin (LEVAQUIN) 750 mg in D5W IVPB  750 mg IntraVENous Q24H   ??? predniSONE (DELTASONE) tablet 20 mg  20 mg Oral DAILY WITH BREAKFAST   ??? [START ON 03/24/2015] predniSONE (DELTASONE) tablet 10 mg  10 mg Oral DAILY WITH BREAKFAST   ??? enoxaparin (LOVENOX) injection 40 mg  40 mg SubCUTAneous DAILY   ??? LORazepam (ATIVAN) injection 2 mg  2 mg IntraVENous Q2H PRN       Review of Systems  Unobtainable due to patient status.    Objective:     Filed Vitals:    03/21/15 1954 03/21/15 2341 03/22/15 0603 03/22/15 0719   BP: 99/69 132/79 136/80 120/74   Pulse: 83 80 64 68    Temp: 97.4 ??F (36.3 ??C) 97.5 ??F (36.4 ??C) 97.4 ??F (36.3 ??C) 97.3 ??F (36.3 ??C)   Resp: 18 18 18 18    Height:       Weight:       SpO2: 94% 90% 94% 91%     Intake and Output:   04/07 1901 - 04/09 0700  In: 240 [P.O.:240]  Out: -        Physical Exam:   Constitution:  the patient is well developed and in no acute distress  EENMT:  Sclera clear, pupils equal, oral mucosa moist  Respiratory: diminished bilaterally, RA  Cardiovascular:  RRR without M,G,R  Gastrointestinal: soft and non-tender; with positive bowel sounds.  Musculoskeletal: warm without cyanosis. There is no lower leg edema.  Skin:  no jaundice or rashes, no open wounds   Neurologic: no gross neuro deficits     Psychiatric:  alert and oriented x 1    CHEST XRAY:   4/7          LAB   Recent Labs      03/20/15   1704   WBC  12.8*  HGB  13.9   HCT  39.7*   PLT  292     Recent Labs      03/20/15   1704   NA  133*   K  4.5   CL  101   CO2  25   GLU  107*   BUN  10   CREA  0.81   CA  8.3   ALB  2.6*   TBILI  0.6   ALT  104*   SGOT  51*     Recent Labs      03/20/15   1855   PH  7.40   PCO2  38   PO2  68*   HCO3  23     Recent Labs      03/20/15   1855   LCAD  1.3         Assessment:  (Medical Decision Making)     Hospital Problems  Date Reviewed: 26-Mar-2015          Codes Class Noted POA    Hypoxia ICD-10-CM: R09.02  ICD-9-CM: 799.02  26-Mar-2015 Yes    Currently on RA with o2 sat 92%      * (Principal)COPD exacerbation (HCC) ICD-10-CM: J44.1  ICD-9-CM: 491.21  03/20/2015 Unknown    No active wheezing  Taper steroids      Delirium tremens (HCC) ICD-10-CM: N62.952  ICD-9-CM: 291.0  02/25/2015 Yes    Ongoing  Recent admission per Hospitalists - consult pending to assist with DT management  Librium / valium / keppra      Bullous emphysema- severe (Chronic) ICD-10-CM: J43.9  ICD-9-CM: 492.0  10/15/2013 Yes    Severe      Hepatitis C (Chronic) ICD-10-CM: B19.20  ICD-9-CM: 070.70  08/31/2013 Yes        Alcohol abuse (Chronic) ICD-10-CM: F10.10  ICD-9-CM: 305.00  08/28/2013 Yes          Micro:  4/7 BC: NGTD x 2    Plan:  (Medical Decision Making)     ---his COPD is stable ,main issue is ETOH withdrawal with DT's  -will ask hospitalist if they can take over the care    More than 50% of the time documented was spent in face-to-face contact with the patient and in the care of the patient on the floor/unit where the patient is located.    Anitra Lauth, MD

## 2015-03-23 LAB — EKG, 12 LEAD, SUBSEQUENT
Atrial Rate: 92 {beats}/min
Calculated P Axis: 78 degrees
Calculated R Axis: 74 degrees
Calculated T Axis: 81 degrees
Diagnosis: NORMAL
P-R Interval: 172 ms
Q-T Interval: 358 ms
QRS Duration: 94 ms
QTC Calculation (Bezet): 442 ms
Ventricular Rate: 92 {beats}/min

## 2015-03-23 LAB — GLUCOSE, POC: Glucose (POC): 206 mg/dL — ABNORMAL HIGH (ref 65–100)

## 2015-03-23 MED ORDER — DIAZEPAM 5 MG TAB
5 mg | Freq: Four times a day (QID) | ORAL | Status: DC
Start: 2015-03-23 — End: 2015-03-26
  Administered 2015-03-23 – 2015-03-26 (×13): via ORAL

## 2015-03-23 MED FILL — LOVENOX 40 MG/0.4 ML SUBCUTANEOUS SYRINGE: 40 mg/0.4 mL | SUBCUTANEOUS | Qty: 0.4

## 2015-03-23 MED FILL — NICOTINE 21 MG/24 HR DAILY PATCH: 21 mg/24 hr | TRANSDERMAL | Qty: 1

## 2015-03-23 MED FILL — VITAMIN B-1 100 MG TABLET: 100 mg | ORAL | Qty: 1

## 2015-03-23 MED FILL — LEVETIRACETAM 500 MG TAB: 500 mg | ORAL | Qty: 1

## 2015-03-23 MED FILL — LORAZEPAM 2 MG/ML IJ SOLN: 2 mg/mL | INTRAMUSCULAR | Qty: 1

## 2015-03-23 MED FILL — QUETIAPINE 25 MG TAB: 25 mg | ORAL | Qty: 1

## 2015-03-23 MED FILL — DIAZEPAM 5 MG TAB: 5 mg | ORAL | Qty: 1

## 2015-03-23 MED FILL — MAGNESIUM OXIDE 400 MG TAB: 400 mg | ORAL | Qty: 1

## 2015-03-23 MED FILL — PREDNISONE 20 MG TAB: 20 mg | ORAL | Qty: 1

## 2015-03-23 MED FILL — LEVOFLOXACIN IN D5W 750 MG/150 ML IV PIGGY BACK: 750 mg/150 mL | INTRAVENOUS | Qty: 150

## 2015-03-23 NOTE — Progress Notes (Signed)
Ativan 2 mg IV given for increased anxiety. Will monitor.

## 2015-03-23 NOTE — Progress Notes (Signed)
Ativan 2mg  IV slow push given for agitation. Will monitor.

## 2015-03-23 NOTE — Progress Notes (Signed)
Discussed with Dr.freelin and she is willing to take patient over to their service.  Anitra LauthKatarina Daeton Kluth, MD

## 2015-03-23 NOTE — Other (Signed)
03/23/2015 CLINICAL by Camille Bal, RN      Review Status Review Entered      03/24/2015     Details      INPATIENT REMOTE TELEMETRY  PULMONARY SIGNED OFF  NON-VIOLENT RESTRAINTS  LOVENOX 40 MG SQ QD  KEPPRA 500 MG PO BID  ATIVAN 2 MG IV Q2H PRN ??? X3  PREDNISONE 20 MG PO Q AM  VALIUM 5 MG PO Q6H  LEVAQUIN 750 MG IV QD  HALDOL 5 MG IV ONCE  THIAMINE 50 MG PO QD  DISCONTINUE LIBRIUM  GLUC 206  EKG- Normal sinus rhythm     Discussed with Dr.freelin and she is willing to take patient over to their service.  Anitra Lauth, MD  Subjective:    Daily Progress Note: 03/23/2015 10:58 AM    Mr. Guerrette is a 54 year old white male, with a pmh of alcohol and tobacco abuse, who presetned 4/7 to the pulmonary service for a COPD exacerbation. Hospitalist service was consulted for acute encephalopathy. No believed to be DTs as last drink was about 12 days ago. Blood cultrues with no growth to date. Currently on room air. ABG on 4/7 with no impressive hypoxia or hypercapnea. He waxes and wanes, at times being lucid. Yesterday I started schedule seroquel and thiamine. He is on schedule valium. He has a history of polysubstance abuse.         BP 98/71 mmHg   Pulse 81   Temp(Src) 97 ??F (36.1 ??C)   Resp 18   Ht  (1.727 m)   Wt 51.71 kg (114 lb)   BMI 17.34 kg/m2   SpO2 96% O2 Flow Rate (L/min): 2 l/min O2 Device: Nasal cannula    Temp (24hrs), Avg:97.6 ??F (36.4 ??C), Min:97 ??F (36.1 ??C), Max:98.1 ??F (36.7 ??C)  General: awake, alert, oriented to person and place, no acute distress, disheveled  Eyes: non icteric,EOMI  Neck; supple  CV; RRR  PULM: cTAB, non labored  Abd: soft, non tender, non distended, no rebound  Assessment/Plan:      Principal Problem:  COPD exacerbation (HCC) (03/20/2015)    Active Problems:  Alcohol abuse (08/28/2013)    Hepatitis C (08/31/2013)    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    -COPD exac resolved.   -continue schedule valium/seroquel/thiamine. Patient with longstanding substance abuse history which has certainly affected his mental capacity.   -back to [facility] tomorrow?                03/24/2015 CLINICAL by Camille Bal, RN      Review Status Review Entered      03/24/2015     Details      INPATIENT REMOTE TELEMETRY  PULMONARY SIGNED OFF  NON-VIOLENT RESTRAINTS PRN  LOVENOX 40 MG SQ QD  KEPPRA 500 MG PO BID  ATIVAN 2 MG IV Q2H PRN ??? X3  PREDNISONE 20 MG PO Q AM  VALIUM 5 MG PO Q6H  DISCONTINUE ----LEVAQUIN 750 MG IV QD  HALDOL 5 MG IV ONCE  THIAMINE 50 MG PO QD  SEROQUEL INCREASE 50 MG PO BID  Subjective:    Daily Progress Note: 03/24/2015 9:08 AM    Mr. Bukhari is a 54 year old white male, with a pmh of alcohol and tobacco abuse, who presetned 4/7 to the pulmonary service for a COPD exacerbation. Hospitalist service  was consulted for acute encephalopathy. No believed to be DTs as last drink was about 13 days ago. Blood cultrues with no growth to date. ABG on 4/7 with no impressive hypoxia or hypercapnea. He waxes and wanes, at times being lucid and other times being agitated and requiring prn ativan. I have started him on scheduled seroquel and thiamine. He is also on schedule valium. He has a history of polysubstance abuse.       BP 119/71 mmHg   Pulse 80   Temp(Src) 97.6 ??F (36.4 ??C)   Resp 18   Ht 5\' 8"  (1.727 m)   Wt 51.71 kg (114 lb)   BMI 17.34 kg/m2   SpO2 94% O2 Flow Rate (L/min): 2 l/min O2 Device: Nasal cannula    Temp (24hrs), Avg:97.3 ??F (36.3 ??C), Min:97 ??F (36.1 ??C), Max:97.6 ??F (36.4 ??C)  General: lethargic but easily arousable, confused, no acute distress, thin, disheveled  Eyes; non icteric, EOMI  Neck: supple, no rigidity  CV; RRR  PULM CTAB, non labored  Abd: soft, non tender, non distended, no rebound nor guarding  patient with longstanding history of polysubstance abuse. Out of DT window, but probably still struggling with coming off of alcohol. Will  increase seroquel to 50 mg BID. Continue scheduled valium and thiamine. Lights/tv on during day, lights off/tiv off during night to try to adjust his day/night cycle. Back to [facility], maybe tomorrow?     Will stop levaquin, no signs nor symptoms of infection.

## 2015-03-23 NOTE — Progress Notes (Signed)
Hospitalist Progress Note    Subjective:   Daily Progress Note: 03/23/2015 10:58 AM    [name] is a 54 year old white male, with a pmh of alcohol and tobacco abuse, who presetned 4/7 to the pulmonary service for a COPD exacerbation. Hospitalist service was consulted for acute encephalopathy. No believed to be DTs as last drink was about 12 days ago. Blood cultrues with no growth to date. Currently on room air.?? ABG on 4/7 with no impressive hypoxia or hypercapnea. He waxes and wanes, at times being lucid. Yesterday I started schedule seroquel and thiamine.  He is on schedule valium.  He has a history of polysubstance abuse.     Nursing notes and chart reviewed. No acute events overnight.  Patient denies chest pain, sob, fever/chills.     Current Facility-Administered Medications   Medication Dose Route Frequency   ??? thiamine (B-1) tablet 50 mg  50 mg Oral DAILY   ??? QUEtiapine (SEROquel) tablet 25 mg  25 mg Oral BID   ??? albuterol-ipratropium (DUO-NEB) 2.5 MG-0.5 MG/3 ML  3 mL Nebulization Q6H PRN   ??? cloNIDine (CATAPRES) 0.2 mg/24 hr patch 1 Patch  1 Patch TransDERmal Q7D   ??? levETIRAcetam (KEPPRA) tablet 500 mg  500 mg Oral BID   ??? magnesium oxide (MAG-OX) tablet 400 mg  400 mg Oral BID   ??? nicotine (NICODERM CQ) 21 mg/24 hr patch 1 Patch  1 Patch TransDERmal DAILY   ??? sodium chloride (NS) flush 5-10 mL  5-10 mL IntraVENous Q8H   ??? sodium chloride (NS) flush 5-10 mL  5-10 mL IntraVENous PRN   ??? diazepam (VALIUM) tablet 5 mg  5 mg Oral Q6H   ??? levofloxacin (LEVAQUIN) 750 mg in D5W IVPB  750 mg IntraVENous Q24H   ??? [START ON 03/24/2015] predniSONE (DELTASONE) tablet 10 mg  10 mg Oral DAILY WITH BREAKFAST   ??? enoxaparin (LOVENOX) injection 40 mg  40 mg SubCUTAneous DAILY   ??? LORazepam (ATIVAN) injection 2 mg  2 mg IntraVENous Q2H PRN        Review of Systems  ROS obtained and findings negative unless stated otherwise in above HPI    Objective:      BP 98/71 mmHg   Pulse 81   Temp(Src) 97 ??F (36.1 ??C)   Resp 18   Ht  (1.727 m)   Wt 51.71 kg (114 lb)   BMI 17.34 kg/m2   SpO2 96% O2 Flow Rate (L/min): 2 l/min O2 Device: Nasal cannula    Temp (24hrs), Avg:97.6 ??F (36.4 ??C), Min:97 ??F (36.1 ??C), Max:98.1 ??F (36.7 ??C)      04/10 0701 - 04/10 1900  In: 480 [P.O.:480]  Out: -   04/08 1901 - 04/10 0700  In: 720 [P.O.:720]  Out: 0     General: awake, alert, oriented to person and place, no acute distress, disheveled  Eyes: non icteric,EOMI  Neck; supple  CV; RRR  PULM: cTAB, non labored  Abd: soft, non tender, non distended, no rebound    Additional comments:  All labs, notes and studies from the past 24 hours have been personally reviewed today by me.     Data Review    Recent Results (from the past 24 hour(s))   EKG, 12 LEAD, SUBSEQUENT    Collection Time: 03/23/15  8:15 AM   Result Value Ref Range    Systolic BP  mmHg    Diastolic BP  mmHg    Ventricular Rate 92  BPM    Atrial Rate 92 BPM    P-R Interval 172 ms    QRS Duration 94 ms    Q-T Interval 358 ms    QTC Calculation (Bezet) 442 ms    Calculated P Axis 78 degrees    Calculated R Axis 74 degrees    Calculated T Axis 81 degrees    Diagnosis       Normal sinus rhythm  Normal ECG  When compared with ECG of 20-Mar-2015 18:49,  No significant change was found  Best of many            Assessment/Plan:     Principal Problem:    COPD exacerbation (HCC) (03/20/2015)    Active Problems:    Alcohol abuse (08/28/2013)      Hepatitis C (08/31/2013)      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   -COPD exac resolved.   -continue schedule valium/seroquel/thiamine.  Patient with longstanding substance abuse history which has certainly affected his mental capacity.   -back to [facility] tomorrow?     Care Plan discussed with: the patient, his care team, pulmonary

## 2015-03-23 NOTE — Progress Notes (Signed)
Reassessment complete. Will continue to monitor.

## 2015-03-23 NOTE — Progress Notes (Signed)
Shift assessment complete. No complaints. Safety measures in place. Instructed to use call light if assistance is needed.

## 2015-03-24 MED ORDER — QUETIAPINE 25 MG TAB
25 mg | Freq: Two times a day (BID) | ORAL | Status: DC
Start: 2015-03-24 — End: 2015-03-25
  Administered 2015-03-24 – 2015-03-25 (×3): via ORAL

## 2015-03-24 MED FILL — QUETIAPINE 25 MG TAB: 25 mg | ORAL | Qty: 2

## 2015-03-24 MED FILL — LEVOFLOXACIN IN D5W 750 MG/150 ML IV PIGGY BACK: 750 mg/150 mL | INTRAVENOUS | Qty: 150

## 2015-03-24 MED FILL — MAGNESIUM OXIDE 400 MG TAB: 400 mg | ORAL | Qty: 1

## 2015-03-24 MED FILL — LORAZEPAM 2 MG/ML IJ SOLN: 2 mg/mL | INTRAMUSCULAR | Qty: 1

## 2015-03-24 MED FILL — LOVENOX 40 MG/0.4 ML SUBCUTANEOUS SYRINGE: 40 mg/0.4 mL | SUBCUTANEOUS | Qty: 0.4

## 2015-03-24 MED FILL — PREDNISONE 10 MG TAB: 10 mg | ORAL | Qty: 1

## 2015-03-24 MED FILL — DIAZEPAM 5 MG TAB: 5 mg | ORAL | Qty: 1

## 2015-03-24 MED FILL — NICOTINE 21 MG/24 HR DAILY PATCH: 21 mg/24 hr | TRANSDERMAL | Qty: 1

## 2015-03-24 MED FILL — LEVETIRACETAM 500 MG TAB: 500 mg | ORAL | Qty: 1

## 2015-03-24 MED FILL — VITAMIN B-1 100 MG TABLET: 100 mg | ORAL | Qty: 1

## 2015-03-24 NOTE — Progress Notes (Signed)
Problem: Dysphagia (Adult)  Goal: *Speech Goal: (INSERT TEXT)  STG: Pt will tolerate mechanical soft textures/honey thick liquids without signs/sx of aspiration with 100% accuracy  STG: Pt will participate with laryngeal exercises x10 with 80% accuracy  STG: Pt will participate with repeat modified barium swallow study (MBS) if/when appropriate  LTG: Pt will tolerate the least restrictive diet at discharge without respiratory compromise  SPEECH LANGUAGE PATHOLOGY: BEDSIDE SWALLOW NOTE: Daily Note 1    NAME/AGE/GENDER: Brett Becker is a 54 y.o. male  DATE: 03/24/2015  PRIMARY DIAGNOSIS: COPD exacerbation (HCC)  COPD exacerbation (HCC)       ICD-10: Treatment Diagnosis: dysphagia; oropharyngeal R13.12  INTERDISCIPLINARY COLLABORATION: Registered Nurse  PRECAUTIONS/ALLERGIES: Codeine   ASSESSMENT:   Patient seen for dysphagia treatment.  He was able to participate with exercises during recent admission but is very confused at this time and in bilateral wrist restraints.  Less intelligible this am.    Patient observed with honey thick liquids without overt signs/sx of aspiration.  Will follow for dysphagia treatment as able to participate.      ????????This section established at most recent assessment??????????  PROBLEM LIST (Impairments causing functional limitations):  1. dysphagia  REHABILITATION POTENTIAL FOR STATED GOALS: FAIR      PLAN OF CARE:   Patient will benefit from skilled intervention to address the following impairments.  RECOMMENDATIONS AND PLANNED INTERVENTIONS (Benefits and precautions of therapy have been discussed with the patient.):  ?? PO:  Mechanical soft with chopped meat and vegetables  ?? Liquids:  honey  MEDICATIONS:  ?? With Thickened Liquid  COMPENSATORY STRATEGIES/MODIFICATIONS INCLUDING:  ?? Small sips and bites  OTHER RECOMMENDATIONS (including follow up treatment recommendations):   ?? Family training/education  ?? Laryngeal exercises  ?? Patient education   RECOMMENDED DIET MODIFICATIONS DISCUSSED WITH:  ?? Nursing  ?? Patient  FREQUENCY/DURATION: Continue to follow patient 3-5x a week to address above goals.   RECOMMENDED REHABILITATION/EQUIPMENT: (at time of discharge pending progress):   to be determined.      SUBJECTIVE:   Confused.  History of Present Injury/Illness: Brett Becker  has a past medical history of Delirium tremens [facility](HCC) (September, 2014) and Gastric ulcer (September, 2014).  He also  has past surgical history that includes neurological procedure unlisted; back surgery; and endoscopy (September, 2014).  Present Symptoms:   Pain Intensity 1: 0  Current Medications:   No current facility-administered medications on file prior to encounter.     Current Outpatient Prescriptions on File Prior to Encounter   Medication Sig Dispense Refill   ??? levETIRAcetam (KEPPRA) 500 mg tablet Take 1 Tab by mouth two (2) times a day. 60 Tab 0   ??? scopolamine (TRANSDERM-SCOP) 1.5 mg (1 mg over 3 days) pt3d 1 Patch by TransDERmal route every seventy-two (72) hours. 10 Patch 0   ??? cloNIDine (CATAPRES) 0.2 mg/24 hr patch 1 Patch by TransDERmal route every seven (7) days. 4 Patch 0   ??? magnesium oxide (MAG-OX) 400 mg tablet Take 1 Tab by mouth two (2) times a day. 60 Tab 0   ??? albuterol-ipratropium (DUO-NEB) 2.5 mg-0.5 mg/3 ml nebu 3 mL by Nebulization route every six (6) hours as needed. 300 mL 0   ??? nicotine (NICODERM CQ) 21 mg/24 hr 1 Patch by TransDERmal route daily for 30 days. 30 Patch 0     Current Dietary Status:  NPO pending consult                 ?? History of  reflux:  no  Social History/Home Situation: [employer]: Rehabilitation facility  One/Two Story Residence: Other (Comment)  Living Alone: No  Support Systems: Family member(s) (from [employer])  Patient Expects to be Discharged to:: Rehabilitation facility  Current DME Used/Available at Home: Other (comment) ([employer])      OBJECTIVE:   Respiratory Status:         CXR Results:Borderline present in the lower lobes. No lobar consolidation  MRI/CT Results:n/a  Oral Motor Structure/Speech:  Oral-Motor Structure/Motor Speech  Labial: No impairment  Dentition: Limited  Lingual: Decreased rate    Cognitive and Communication Status:  Neurologic State: Alert  Orientation Level: Oriented to person  Cognition: Impaired decision making             BEDSIDE SWALLOW EVALUATION  Oral Assessment:  Oral Assessment  Dentition: Limited  P.O. Trials:       The patient was given tsp to straw amounts of the following:   Consistency Presented: Honey thick liquid       ORAL PHASE:     Bolus Formation/Control: Impaired  Propulsion: Delayed (# of seconds)  Type of Impairment: Delayed       PHARYNGEAL PHASE:  Initiation of Swallow: Delayed (# of seconds)     Aspiration Signs/Symptoms: None                   OTHER OBSERVATIONS:  Rate/bite size: WNL         Endurance:  Questionable              Tool Used: Dysphagia Outcome and Severity Scale (DOSS)     Score Comments   Normal Diet   7 With no strategies or extra time needed   Functional Swallow   6 May have mild oral or pharyngeal delay        Mild Dysphagia      5 Which may require one diet consistency restricted (those who demonstrate penetration which is entirely cleared on MBS would be included)   Mild-Moderate Dysphagia   4 With 1-2 diet consistencies restricted        Moderate Dysphagia   3 With 2 or more diet consistencies restricted        Moderately Severe Dysphagia   2 With partial PO strategies (trials with ST only)        Severe Dysphagia   1 With inability to tolerate any PO safely           Score:  Initial: 3 Most Recent: X (Date: -- )   Interpretation of Tool: The Dysphagia Outcome and Severity Scale (DOSS) is a simple, easy-to-use, 7-point scale developed to systematically rate the functional severity of dysphagia based on objective assessment and make  recommendations for diet level, independence level, and type of nutrition.       Score Modifier CH CI CJ CK CL CM CN   ?? Swallowing:              Z6109 - CURRENT STATUS:           CL - 60%-79% impaired, limited or restricted              U0454 - GOAL STATUS:                   CJ - 20%-39% impaired, limited or restricted  Z6109 - D/C STATUS:                       ---------------To be determined---------------  Payor: ADVICARE MEDICAID / Plan: [place]VICARE MEDICAID / Product Type: Managed Care Medicaid /       TREATMENT:               (In addition to Assessment/Re-Assessment sessions the following treatments were rendered)  Dysphagia Activities: Activities/Procedures listed utilized to improve progress in diet tolerance. Required min cueing to decrease aspiration risk.  MODALITIES:                                                                    ORAL MOTOR  EXERCISES:                                                                                                                                                                      LARYNGEAL / PHARYNGEAL EXERCISES:                                                                                                                                     __________________________________________________________________________________________________  Safety:   After treatment position/precautions:  ?? RN notified  ?? Upright in Bed  Progression/Medical Necessity:   ?? Skilled intervention continues to be required due to persistent signs and symptoms of aspiration and patient still consuming a modified diet.  Compliance with Program/Exercises: Will assess as treatment progresses.   Reason for Continuation of Services/Other Comments:  ?? Patient continues to require skilled intervention due to dysphagia.  Recommendations/Intent for next treatment session: "Treatment next visit will focus on laryngeal exercises".    Total Treatment Duration:   Time In: 1155  Time Out: 1209    Reginia Naas MS, CCC-SLP

## 2015-03-24 NOTE — Progress Notes (Signed)
Pt refused all po meds this morning. Very agitated, uncooperative.

## 2015-03-24 NOTE — Progress Notes (Signed)
Pt agitated with tremors, giving Ativan 2 mg.

## 2015-03-24 NOTE — Progress Notes (Signed)
Pt uncooperative much of the shift, able to administer meds this afternoon with meals, pt able to rest.

## 2015-03-24 NOTE — Progress Notes (Signed)
Pt also treated PCTs roughly, physically and verbally.

## 2015-03-24 NOTE — Progress Notes (Signed)
Hospitalist Progress Note    Subjective:   Daily Progress Note: 03/24/2015 9:08 AM    Brett Becker is a 54 year old white maleyear old white male, with a pmh of alcohol and tobacco abuse, who presetned 4/7 to the pulmonary service for a COPD exacerbation. Hospitalist service was consulted for acute encephalopathy. No believed to be DTs as last drink was about 13 days ago. Blood cultrues with no growth to date.  ABG on 4/7 with no impressive hypoxia or hypercapnea. He waxes and wanes, at times being lucid and other times being agitated and requiring prn ativan.  I have started him on scheduled seroquel and thiamine.?? He is also on schedule valium.?? He has a history of polysubstance abuse.     Nursing notes and chart reviewed.  He is currently confused.  Has received multiple prn ativan doses.     Current Facility-Administered Medications   Medication Dose Route Frequency   ??? QUEtiapine (SEROquel) tablet 50 mg  50 mg Oral BID   ??? diazepam (VALIUM) tablet 2.5 mg  2.5 mg Oral Q6H   ??? thiamine (B-1) tablet 50 mg  50 mg Oral DAILY   ??? albuterol-ipratropium (DUO-NEB) 2.5 MG-0.5 MG/3 ML  3 mL Nebulization Q6H PRN   ??? cloNIDine (CATAPRES) 0.2 mg/24 hr patch 1 Patch  1 Patch TransDERmal Q7D   ??? levETIRAcetam (KEPPRA) tablet 500 mg  500 mg Oral BID   ??? magnesium oxide (MAG-OX) tablet 400 mg  400 mg Oral BID   ??? nicotine (NICODERM CQ) 21 mg/24 hr patch 1 Patch  1 Patch TransDERmal DAILY   ??? sodium chloride (NS) flush 5-10 mL  5-10 mL IntraVENous Q8H   ??? sodium chloride (NS) flush 5-10 mL  5-10 mL IntraVENous PRN   ??? predniSONE (DELTASONE) tablet 10 mg  10 mg Oral DAILY WITH BREAKFAST   ??? enoxaparin (LOVENOX) injection 40 mg  40 mg SubCUTAneous DAILY   ??? LORazepam (ATIVAN) injection 2 mg  2 mg IntraVENous Q2H PRN        Review of Systems  Not obtainable as the patient is currently confused.     Objective:     BP 119/71 mmHg   Pulse 80   Temp(Src) 97.6 ??F (36.4 ??C)   Resp 18   Ht 5'  8" (1.727 m)   Wt 51.71 kg (114 lb)   BMI 17.34 kg/m2   SpO2 94% O2 Flow Rate (L/min): 2 l/min O2 Device: Nasal cannula    Temp (24hrs), Avg:97.3 ??F (36.3 ??C), Min:97 ??F (36.1 ??C), Max:97.6 ??F (36.4 ??C)         04/09 1901 - 04/11 0700  In: 480 [P.O.:480]  Out: -     General: lethargic but easily arousable, confused, no acute distress, thin, disheveled  Eyes; non icteric, EOMI  Neck: supple, no rigidity  CV; RRR  PULM CTAB, non labored  Abd: soft, non tender, non distended, no rebound nor guarding    Additional comments:  All labs, notes and studies from the past 24 hours have been personally reviewed today by me.     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  Out of DT window, but probably still struggling with coming off of alcohol.  Will increase seroquel to 50 mg BID.  Continue scheduled valium and thiamine. Lights/tv on during day, lights off/tiv off during night to try to adjust his day/night cycle.  Back to [facility], maybe tomorrow?     Will stop levaquin, no signs nor symptoms of infection.     Care Plan discussed with: the patient, his care team, case management.

## 2015-03-24 NOTE — Progress Notes (Signed)
Called Dr. Arlester MarkerFreelin for non-violent restraints,as pt was pulling at IV site, and trying to get OOB

## 2015-03-24 NOTE — Progress Notes (Signed)
Scheduled Valium 2.5 mg po given.  Will monitor.

## 2015-03-24 NOTE — Progress Notes (Signed)
Pt with increased agitation.  Ativan 2 mg IV given slow push.  Will monitor.

## 2015-03-25 MED ORDER — OLANZAPINE 5 MG TAB, RAPID DISSOLVE
5 mg | Freq: Every evening | ORAL | Status: DC
Start: 2015-03-25 — End: 2015-03-26
  Administered 2015-03-26: 03:00:00 via ORAL

## 2015-03-25 MED FILL — MAGNESIUM OXIDE 400 MG TAB: 400 mg | ORAL | Qty: 1

## 2015-03-25 MED FILL — LEVETIRACETAM 500 MG TAB: 500 mg | ORAL | Qty: 1

## 2015-03-25 MED FILL — PREDNISONE 10 MG TAB: 10 mg | ORAL | Qty: 1

## 2015-03-25 MED FILL — LOVENOX 40 MG/0.4 ML SUBCUTANEOUS SYRINGE: 40 mg/0.4 mL | SUBCUTANEOUS | Qty: 0.4

## 2015-03-25 MED FILL — DIAZEPAM 5 MG TAB: 5 mg | ORAL | Qty: 1

## 2015-03-25 MED FILL — NICOTINE 21 MG/24 HR DAILY PATCH: 21 mg/24 hr | TRANSDERMAL | Qty: 1

## 2015-03-25 MED FILL — LORAZEPAM 2 MG/ML IJ SOLN: 2 mg/mL | INTRAMUSCULAR | Qty: 1

## 2015-03-25 MED FILL — QUETIAPINE 25 MG TAB: 25 mg | ORAL | Qty: 2

## 2015-03-25 MED FILL — VITAMIN B-1 100 MG TABLET: 100 mg | ORAL | Qty: 1

## 2015-03-25 NOTE — Progress Notes (Signed)
2 mg ativan given slow IV push for agitation. Pt restless, pulling at remote telemetry leads, gown, calling out incoherently. Denies he needs help when asked.

## 2015-03-25 NOTE — Progress Notes (Signed)
Speech therapy note  Attempted to see pt this am, however, pt refused to accept PO and participate in laryngea exercises. Pt is not appropriate to participate in therapy at this time. Please re-consult if/when pt can participate.      Craig StaggersErica Moss MA/CCC/SLP

## 2015-03-25 NOTE — Progress Notes (Signed)
Patient stable with no complaints.

## 2015-03-25 NOTE — Progress Notes (Addendum)
Patient voices no concerns at this time.  Call light within reach.  Will monitor.    Patient kicking and stretching, along with yelling curse words.  Ativan 2 mg given.

## 2015-03-25 NOTE — Progress Notes (Signed)
Patient in bed resting with no complaints at this time.  Patient is alert but confused.  IV intact and patent with no s/s of infection noted.  Respirations even and unlabored with heart rate regular running at NSR @ 86.  Patient may ambulate x1/x2 assist.  Bed in low locked position with call light within reach.  Patient instructed to call if assistance is needed.  Door open for visual r/t confusion.  Posey on and alarmed.  Will continue to monitor.

## 2015-03-25 NOTE — Progress Notes (Signed)
Hospitalist Progress Note    03/25/2015  Admit Date: 03/20/2015  6:44 PM   NAME: Brett Becker   DOB:  [birth year]   MRN:  [id number]   Attending: Dorcas CarrowJamie Davis Freelin, MD  PCP:  Sol BlazingPhys Other, MD  Treatment Team: Attending Provider: Dorcas CarrowJamie Davis Freelin, MD; Consulting Provider: Jacqlyn LarsenLucy Davis-Pachter, MD; Nurse Navigator: Orlene PlumJulie W Jackson, RN; Care Manager: Toya SmothersSanford E Myers, RN; Hospitalist: Zola Buttononald W Nevaeh Casillas, MD    Full Code  Not participating in swallowing eval.    SUBJECTIVE:   03/24/2015 9:08 AM    Brett Becker is a 54 year old white male, with a pmh of alcohol and tobacco abuse, who presetned 4/7 to the pulmonary service for a COPD exacerbation. Hospitalist service was consulted for acute encephalopathy. No believed to be DTs as last drink was about 13 days ago. Blood cultrues with no growth to date.?? ABG on 4/7 with no impressive hypoxia or hypercapnea. He waxes and wanes, at times being lucid and other times being agitated and requiring prn ativan.?? I have started him on scheduled seroquel and thiamine.?? He is also on schedule valium.?? He has a history of polysubstance abuse.     03/25/2015 no complaint         No results found for this or any previous visit (from the past 24 hour(s)).    Allergies   Allergen Reactions   ??? Codeine Rash     Current Facility-Administered Medications   Medication Dose Route Frequency Provider Last Rate Last Dose   ??? QUEtiapine (SEROquel) tablet 50 mg  50 mg Oral BID Dorcas CarrowJamie Davis Freelin, MD   50 mg at 03/25/15 [id number]   ??? diazepam (VALIUM) tablet 2.5 mg  2.5 mg Oral Q6H Dorcas CarrowJamie Davis Freelin, MD   2.5 mg at 03/25/15 1152   ??? thiamine (B-1) tablet 50 mg  50 mg Oral DAILY Dorcas CarrowJamie Davis Freelin, MD   50 mg at 03/25/15 [id number]   ??? albuterol-ipratropium (DUO-NEB) 2.5 MG-0.5 MG/3 ML  3 mL Nebulization Q6H PRN Cecilio Asperravis J Greer, MD       ??? cloNIDine (CATAPRES) 0.2 mg/24 hr patch 1 Patch  1 Patch TransDERmal Q7D Cecilio Asperravis J Greer, MD   1 Patch at 03/21/15 0650    ??? levETIRAcetam (KEPPRA) tablet 500 mg  500 mg Oral BID Cecilio Asperravis J Greer, MD   500 mg at 03/25/15 [id number]   ??? magnesium oxide (MAG-OX) tablet 400 mg  400 mg Oral BID Cecilio Asperravis J Greer, MD   400 mg at 03/25/15 0829   ??? nicotine (NICODERM CQ) 21 mg/24 hr patch 1 Patch  1 Patch TransDERmal DAILY Cecilio Asperravis J Greer, MD   1 Patch at 03/25/15 [phone]   ??? sodium chloride (NS) flush 5-10 mL  5-10 mL IntraVENous Q8H Cecilio Asperravis J Greer, MD   5 mL at 03/25/15 0530   ??? sodium chloride (NS) flush 5-10 mL  5-10 mL IntraVENous PRN Cecilio Asperravis J Greer, MD       ??? enoxaparin (LOVENOX) injection 40 mg  40 mg SubCUTAneous DAILY Bobbye CharlestonSandra W Lowe, NP   40 mg at 03/25/15 1152   ??? LORazepam (ATIVAN) injection 2 mg  2 mg IntraVENous Q2H PRN Cecilio Asperravis J Greer, MD   2 mg at 03/25/15 1219           Review of Systems negative with exception of pertinent positives noted above  PHYSICAL EXAM   BP 125/75 mmHg   Pulse 94   Temp(Src) 97.4 ??F (36.3 ??C)   Resp 18  Ht  (1.727 m)   Wt 51.71 kg (114 lb)   BMI 17.34 kg/m2   SpO2 93%   Temp (24hrs), Avg:98.1 ??F (36.7 ??C), Min:97.4 ??F (36.3 ??C), Max:98.9 ??F (37.2 ??C)    Oxygen Therapy  O2 Sat (%): 93 % (03/25/15 1151)  Pulse via Oximetry: 71 beats per minute (03/21/15 0325)  O2 Device: Nasal cannula (03/24/15 1943)  O2 Flow Rate (L/min): 2 l/min (03/24/15 1943)  No intake or output data in the 24 hours ending 03/25/15 1304   General: No acute distress?? frail WM confused and weak??  Lungs:  CTA, normal respiratory effort  Heart:  RRR no  Murmur, gallops or rubs  Abdomen: Soft NT, ND, NHSM  Extremities: No cyanosis, clubbing or edema  Neurologic:?? CN 2- 12 intact, no sensory or motor deficit  No results found for this or any previous visit (from the past 24 hour(s)).      DIAGNOSTIC STUDIES          ASSESSMENT      Active Hospital Problems    Diagnosis Date Noted   ??? Underweight 03/24/2015   ??? Hypoxia 03/21/2015   ??? COPD exacerbation (HCC) 03/20/2015   ??? 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 Hepatitis C 08/31/2013   ??? Alcohol abuse 08/28/2013       Plan: ST eval in am  D/c seroquel sedating but also decrease appetite  Start zyprexa which cant sedated but can also increase appetite  PT eval in am  Continue iv fluid      DVT Prophylaxis:

## 2015-03-26 LAB — CULTURE, BLOOD
Culture result:: NO GROWTH
Culture result:: NO GROWTH

## 2015-03-26 MED ORDER — THIAMINE 50 MG TAB
50 mg | ORAL_TABLET | Freq: Every day | ORAL | Status: AC
Start: 2015-03-26 — End: 2015-04-05

## 2015-03-26 MED ORDER — DIAZEPAM 5 MG TAB
5 mg | ORAL_TABLET | Freq: Four times a day (QID) | ORAL | Status: AC
Start: 2015-03-26 — End: ?

## 2015-03-26 MED ORDER — OLANZAPINE 10 MG TAB, RAPID DISSOLVE
10 mg | ORAL_TABLET | Freq: Every evening | ORAL | Status: AC
Start: 2015-03-26 — End: 2015-04-25

## 2015-03-26 MED FILL — LEVETIRACETAM 500 MG TAB: 500 mg | ORAL | Qty: 1

## 2015-03-26 MED FILL — LOVENOX 40 MG/0.4 ML SUBCUTANEOUS SYRINGE: 40 mg/0.4 mL | SUBCUTANEOUS | Qty: 0.4

## 2015-03-26 MED FILL — LORAZEPAM 2 MG/ML IJ SOLN: 2 mg/mL | INTRAMUSCULAR | Qty: 1

## 2015-03-26 MED FILL — OLANZAPINE 5 MG TAB, RAPID DISSOLVE: 5 mg | ORAL | Qty: 2

## 2015-03-26 MED FILL — MAGNESIUM OXIDE 400 MG TAB: 400 mg | ORAL | Qty: 1

## 2015-03-26 MED FILL — DIAZEPAM 5 MG TAB: 5 mg | ORAL | Qty: 1

## 2015-03-26 MED FILL — NICOTINE 21 MG/24 HR DAILY PATCH: 21 mg/24 hr | TRANSDERMAL | Qty: 1

## 2015-03-26 MED FILL — VITAMIN B-1 100 MG TABLET: 100 mg | ORAL | Qty: 1

## 2015-03-26 NOTE — Progress Notes (Signed)
Patient will transfer back [facility] this afternoon. Transport arranged through [facility]. [facility] will be the transporting agency. Case Management will remain available to assist as needed.

## 2015-03-26 NOTE — Discharge Summary (Signed)
Hospitalist Discharge Summary     Patient ID:  Brett Becker  [id number]  54 y.o.  1961/06/12  Admit date: 03/20/2015  6:44 PM  Discharge date and time: 03/26/2015  Attending: Dorcas CarrowJamie Davis Freelin, MD  PCP:  Sol BlazingPhys Other, MD  Treatment Team: Attending Provider: Dorcas CarrowJamie Davis Freelin, MD; Consulting Provider: Jacqlyn LarsenLucy Davis-Pachter, MD; Nurse Navigator: Orlene PlumJulie W Jackson, RN; Care Manager: Toya SmothersSanford E Myers, RN; Hospitalist: Zola Buttononald W Frederick Marro, MD    Principal Diagnosis COPD exacerbation [facility](HCC)   Principal Problem:    COPD exacerbation (HCC) (03/20/2015)    Active Problems:    Alcohol abuse (08/28/2013)      Hepatitis C (08/31/2013)      Bullous emphysema- severe (10/15/2013)      Delirium tremens (HCC) (02/25/2015)      Hypoxia (03/21/2015)      Underweight (03/24/2015)             Hospital Course:  Please refer to the admission H&P for details of presentation. In summary, the patient is   Brett Becker is a 54 year old white male, with a pmh of alcohol and tobacco abuse, who presetned 4/7 to the pulmonary service for a COPD exacerbation. Hospitalist service was consulted for acute encephalopathy. No believed to be DTs as last drink was about 13 days ago. Blood cultrues with no growth to date.?? ABG on 4/7 with no impressive hypoxia or hypercapnea. He waxes and wanes, at times being lucid and other times being agitated and requiring prn ativan.?? I have started him on scheduled seroquel and thiamine.?? He is also on schedule valium.?? He has a history of polysubstance abuse    Significant Diagnostic Studies:       Labs: Results:       Chemistry No results for input(s): GLU, NA, K, CL, CO2, BUN, CREA, CA, AGAP, BUCR, TBIL, GPT, AP, TP, ALB, GLOB, AGRAT in the last 72 hours.   CBC w/Diff No results for input(s): WBC, RBC, HGB, HCT, PLT, GRANS, LYMPH, EOS, HGBEXT, HCTEXT, PLTEXT in the last 72 hours.   Cardiac Enzymes No results for input(s): CPK, CKND1, MYO in the last 72 hours.    Invalid input(s): CKRMB, TROIP    Coagulation No results for input(s): PTP, INR, APTT in the last 72 hours.    Invalid input(s): INREXT    Lipid Panel Lab Results   Component Value Date/Time    CHOLESTEROL, TOTAL 94 03/05/2015 05:45 AM    HDL CHOLESTEROL 28 03/05/2015 05:45 AM    LDL, CALCULATED 56.2 03/05/2015 05:45 AM    VLDL, CALCULATED 9.8 03/05/2015 05:45 AM    TRIGLYCERIDE 49 03/05/2015 05:45 AM    CHOL/HDL RATIO 3.4 03/05/2015 05:45 AM      BNP No results for input(s): BNPP in the last 72 hours.   Liver Enzymes No results for input(s): TP, ALB, TBIL, AP, SGOT, GPT in the last 72 hours.    Invalid input(s): DBIL   Thyroid Studies Lab Results   Component Value Date/Time    TSH 1.980 08/28/2013 08:16 AM            Discharge Exam:  BP 149/90 mmHg   Pulse 93   Temp(Src) 97.8 ??F (36.6 ??C)   Resp 18   Ht 5\' 8"  (1.727 m)   Wt 51.71 kg (114 lb)   BMI 17.34 kg/m2   SpO2 93%  General:????????????????????No acute distress?? frail WM confused and weak??  Lungs:??????????????????????????CTA, normal respiratory effort  Heart:????????????????????????????RRR no?? Murmur, gallops or rubs  Abdomen:??????????????Soft NT,  ND, NHSM  Extremities:??????????No cyanosis, clubbing or edema  Neurologic:??????????CN 2- 12 intact, no sensory or motor deficitDisposition: SNF  Discharge Condition: stable  Patient Instructions:   Current Discharge Medication List      START taking these medications    Details   OLANZapine (ZYPREXA ZYDIS) 10 mg disintegrating tablet Take 1 Tab by mouth nightly for 30 days.  Qty: 30 Tab, Refills: 0      thiamine (B-1) 50 mg tablet Take 1 Tab by mouth daily for 10 days.  Qty: 10 Tab, Refills: 0      diazepam (VALIUM) 5 mg tablet Take 0.5 Tabs by mouth every six (6) hours. Max Daily Amount: 10 mg.  Qty: 12 Tab, Refills: 0         CONTINUE these medications which have NOT CHANGED    Details   levETIRAcetam (KEPPRA) 500 mg tablet Take 1 Tab by mouth two (2) times a day.  Qty: 60 Tab, Refills: 0      cloNIDine (CATAPRES) 0.2 mg/24 hr patch 1 Patch by TransDERmal route every seven (7) days.   Qty: 4 Patch, Refills: 0      magnesium oxide (MAG-OX) 400 mg tablet Take 1 Tab by mouth two (2) times a day.  Qty: 60 Tab, Refills: 0      albuterol-ipratropium (DUO-NEB) 2.5 mg-0.5 mg/3 ml nebu 3 mL by Nebulization route every six (6) hours as needed.  Qty: 300 mL, Refills: 0      nicotine (NICODERM CQ) 21 mg/24 hr 1 Patch by TransDERmal route daily for 30 days.  Qty: 30 Patch, Refills: 0         STOP taking these medications       scopolamine (TRANSDERM-SCOP) 1.5 mg (1 mg over 3 days) pt3d Comments:   Reason for Stopping:         chlordiazePOXIDE (LIBRIUM) 10 mg capsule Comments:   Reason for Stopping:               Activity: Activity as tolerated  Diet: Regular Diet  Wound Care: Keep wound clean and dry    Follow-up  ??     Time spent to discharge patient greater than 30 minutes  Signed:  Zola Button, MD  03/26/2015  1:44 PM

## 2015-03-26 NOTE — Progress Notes (Signed)
Pt resting quietly at this time, respirations even and unlabored. No acute distress noted. Call light within reach, instructed to call for assistance.

## 2015-03-26 NOTE — Progress Notes (Signed)
Problem: Mobility Impaired (Adult and Pediatric)  Goal: *Acute Goals and Plan of Care (Insert Text)  DISCHARGE GOALS:  (1.)Brett Becker will move from supine to sit and sit to supine , scoot up and down and roll side to side with MODERATE ASSIST within 7 day(s).   (2.)Brett Becker will transfer from bed to chair and chair to bed with MODERATE ASSIST using the least restrictive device within 7 day(s).   (3.)Brett Becker will sit EOB with fair sitting balance and keep trunk control using least restrictive device within 7 day(s).   PHYSICAL THERAPY: INITIAL ASSESSMENT AND AM  INPATIENT: Medicaid : Hospital Day: 7    NAME/AGE/GENDER: Brett Becker is a 54 y.o. male  DATE: 03/26/2015  PRIMARY DIAGNOSIS: COPD exacerbation (HCC)  COPD exacerbation (HCC)  COPD exacerbation (HCC)  COPD exacerbation ([place])        ICD-10: Treatment Diagnosis: Generalized Muscle Weakness (M62.81)  Other lack of cordination (R27.8)  Other abnormalities of gait and mobility (R26.89)  INTERDISCIPLINARY COLLABORATION: Physical Therapist, Registered Nurse and Certified Nursing Assistant/Patient Care Technician  ASSESSMENT:   Brett Becker is a 54 y.o. male curled up in the fetal position supine in bed (with posey pad alert on).  He attempts to answer PT questions; however, noises are garbled and incoherent.  Brett Becker is unable to provide any past medical history.  He performed bed mobility with max to almost total assistance x 1.  He does attempt to move arms to assist, however, seems too drowsy to perform (was given Ativan prior to evaluation).  Brett Becker sat EOB but needed assistance in sitting--very poor/unsafe sitting balance.  He leans forward way too far at trunk.  With cuing and assistance he is able to adjust, but not able to maintain balance without help.  He performed 1 attempt at sit to stand with max assistance x 2.  He does not push through legs/extend hips enough to stand completely erect.   He returned to sitting after ~10 seconds standing with max x2 assistance.  Brett Becker returned to supine positioning.  He may be at baseline (?); but will most definitely benefit from continued therapy.  Brett Becker planning on going to [facility] upon hospital DC--this will be good for him.  Brett Becker will benefit from skilled PT (medically necessary) to address decreased strength, decreased balance, decreased trunk control, decreased functional tolerance affecting participation in basic ADLs and functional tasks.       ????????This section established at most recent assessment??????????  PROBLEM LIST (Impairments causing functional limitations):  1. Decreased Strength effecting function  2. Decreased ADL/Functional Activities  3. Decreased Transfer Abilities  4. Decreased Ambulation Ability/Technique  5. Decreased Balance  6. Decreased Activity Tolerance  7. Increased Fatigue effecting function  8. Increased Shortness of Breath affecting function  REHABILITATION POTENTIAL FOR STATED GOALS: GOOD      PLAN OF CARE:   INTERVENTIONS PLANNED: (Benefits and precautions of physical therapy have been discussed with the patient.)  1. balance exercise  2. bed mobility  3. home exercise program (HEP)  4. manual therapy  5. neuromuscular re-education/strengthening  6. range of motion: active/assisted/passive  7. therapeutic activities  8. therapeutic exercise/strengthening  9. transfer training  FREQUENCY/DURATION: Follow patient 1-2 times per day/4-7 days per week until goals are met in order to address above goals.    RECOMMENDED REHABILITATION/EQUIPMENT: (at time of discharge pending progress):   Rehab.  SUBJECTIVE:   "mm (garbled speech)"    Present Symptoms: decreased strength,  decreased balance, decreased trunk control, decreased functional tolerance affecting participation in basic ADLs and functional tasks.    Pain Intensity 1: 0  History of Present Injury/Illness:   Active Ambulatory Problems      Diagnosis Date Noted   ??? 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 Alcohol abuse 08/28/2013   ??? HTN (hypertension) 08/30/2013   ??? Gastric ulcer 08/31/2013   ??? Hepatitis C 08/31/2013   ??? Marijuana abuse 09/02/2013   ??? 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 Delirium tremens ([place]) 02/25/2015   ??? Hyponatremia 02/25/2015       Resolved Ambulatory Problems     Diagnosis Date Noted   ??? No Resolved Ambulatory Problems       No Additional Past Medical History     Prior Level of Function/Home Situation:   History       Social History   ??? Marital Status: SINGLE       Spouse Name: N/A   ??? Number of Children: N/A   ??? Years of Education: N/A       Occupational History   ??? Disabled Not Employed       disabled secondary to back injury       Social History Main Topics   ??? Smoking status: Current Every Day Smoker -- 1.00 packs/day for 40 years       Types: Cigarettes   ??? Smokeless tobacco: Never Used   ??? Alcohol Use: Yes   ??? Drug Use: 2.00 per week         Comment: marijuana    ??? Sexual Activity: Not on file       Other Topics Concern   ??? Not on file       Social History Narrative     Lives with roommate     Previously employed in tree cutting - currently on disability with history of back injury and surgery x 2                        Home Environment: Rehabilitation facility  One/Two Story Residence: Other (Comment)  Living Alone: No  Support Systems: Family member(s) (from [facility])  Patient Expects to be Discharged to:: Rehabilitation facility  Current DME Used/Available at Home: Other (comment) Tye Savoy)  OBJECTIVE/TREATMENT:   (In addition to Assessment/Re-Assessment sessions the following treatments were rendered)                                      [facility]??? ???6 Clicks???                                          Basic Mobility Inpatient Short Form  How much difficulty does the patient currently have... Unable A Lot A Little None   1.  Turning over in bed (including adjusting bedclothes, sheets and  blankets)?   '[X]'  1   '[ ]'  2   '[ ]'  3   '[ ]'  4   2.  Sitting down on and standing up from a chair with arms ( e.g., wheelchair, bedside commode, etc.)   '[X]'  1   '[ ]'  2   '[ ]'  3   '[ ]'  4   3.  Moving from lying on back to sitting on the side of the bed?   [  X] 1   '[ ]'  2   '[ ]'  3   '[ ]'  4               How much help from another person does the patient currently need... Total A Lot A Little None   4.  Moving to and from a bed to a chair (including a wheelchair)?   '[X]'  1   '[ ]'  2   '[ ]'  3   '[ ]'  4   5.  Need to walk in hospital room?   '[X]'  1   '[ ]'  2   '[ ]'  3   '[ ]'  4   6.  Climbing 3-5 steps with a railing?   '[X]'  1   '[ ]'  2   '[ ]'  3   '[ ]'  4   ?? 2007, [employer], under license to [place]. All rights reserved       Score:  Initial: 6 Most Recent: X (Date: -- )   Interpretation of Tool:  Represents activities that are increasingly more difficult (i.e. Bed mobility, Transfers, Gait).  Score 24 23 22-20 19-15 14-10 9-7 6   Modifier CH CI CJ CK CL CM CN       ?? Mobility - Walking and Moving Around:              [phone] - CURRENT STATUS:       CN - 100% impaired, limited or restricted              E9528 - GOAL STATUS:               CM - 80%-99% impaired, limited or restricted              U1324 - D/C STATUS:                    ---------------To be determined---------------  Payor: ADVICARE MEDICAID / Plan: [place]IC[place] MEDICA[place] / Product Type: Managed Care Medicaid /    Most Recent Physical Functioning:   Gross Assessment:  AROM: Grossly decreased, non-functional (extreme trunk flexion upon PT entrance-fetal positioning)  PROM: Grossly decreased, non-functional (passively able to extend B knee flexion seated-difficult)  Strength: Grossly decreased, non-functional  Coordination: Grossly decreased, non-functional  Sensation:  (Unable to assess patient no oriented enough--babbles)               Posture:  Posture (WDL): Exceptions to WDL  Balance:  Sitting: Impaired  Sitting - Static: Poor (constant support)   Sitting - Dynamic: Poor (constant support)  Standing - Static: Poor (max to total x 2)  Bed Mobility:  Rolling: Maximum assistance;Assist x1  Supine to Sit: Maximum assistance;Assist x1  Sit to Supine: Maximum assistance;Assist x2  Scooting: Maximum assistance;Assist x1  Wheelchair Mobility:     Transfers:  Sit to Stand: Maximum assistance;Assist x2  Stand to Sit: Maximum assistance;Assist x2  Gait:            Assessment/Reassessment only, no treatment provided today    Date:    Date:    Date:      Activity/Exercise Parameters Parameters Parameters  Braces/Orthotics/Lines/Etc:   ?? O2 Device: Nasal cannula  Safety:   After treatment position/precautions:  ?? Bed alarm/tab alert on  ?? Bed/Chair-wheels locked  ?? Bed in low position  ?? Call light within reach  ?? RN notified  Progression/Medical Necessity:   ?? Skilled intervention continues to be required due to decreased strength, decreased balance, decreased trunk control, decreased functional tolerance affecting participation in basic ADLs and functional tasks. .  Compliance with Program/Exercises: Will assess as treatment progresses.   Reason for Continuation of Services/Other Comments:  ?? Patient continues to require skilled intervention due to medical complications and patient unable to attend/participate in therapy as expected.  Recommendations/Intent for next treatment session: Treatment next visit will focus on advancements to more challenging activities and reduction in assistance provided.  Total Treatment Duration:  Time In: 1044  Time Out: 1056  Sigurd Sos, DPT

## 2015-03-26 NOTE — Progress Notes (Signed)
Patient is anticipated to discharge today. [facility] medically [facility]. MD notified that bed is ready. Case Management will set up transport via [facility] when orders received. Case Management will continue to follow.    Care Management Interventions  Transition of Care Consult (CM Consult): SNF  Discharge Durable Medical Equipment: No  Physical Therapy Consult: Yes  Occupational Therapy Consult: No  Speech Therapy Consult: Yes  [facility]  Confirm Follow Up [facility]  Plan discussed with Pt/Family/Caregiver: Yes  Freedom of Choice Offered: Yes  Discharge Location  Discharge Placement: Skilled nursing facility

## 2015-03-26 NOTE — Progress Notes (Signed)
Patient visually is not in distress or pain.  Call light within reach.  Will monitor.

## 2015-03-26 NOTE — Progress Notes (Signed)
Remote telemetry d/c'd per Dr. Lodema HongSimpson. Telemetry box returned to monitor room.

## 2015-03-26 NOTE — Progress Notes (Signed)
[facility] on [facility] going to [phone].  Nurse given time for questions and answers and name and number left if questions arise.  Transport to be here at 16:30 for transport to [facility].  Family notified.

## 2015-03-26 NOTE — Progress Notes (Signed)
Patient in bed resting with no complaints at this time.  Patient is confused at all times but no distress noted.  IV intact and patent with no s/s of infection noted.  Respirations even and unlabored with heart rate regular.  Patient is bedfast at this time.  Patient chews on oxygen tubing and bedding.  Call light within reach and patient instructed to call if assistance is needed.  Will continue to monitor.  Door open for visual.  Posey on and working.

## 2015-04-01 NOTE — Op Note (Signed)
PATIENT NAME:  [name], [name] DATE OF BIRTH:  July 05, 1961  DATE OF PROCEDURE:  09/01/2012  PREOPERATIVE DIAGNOSIS:   Not dictated.  POSTOPERATIVE DIAGNOSIS:  Not dictated.  PROCEDURE:  Implantation of a single chamber implantable cardiac defibrillator.   SURGEON:  Anna GenreKevin L. Maisie Fushomas, MD.  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 Prior myocardial infarction, most recently in April 2013. History of cerebrovascular accident.   DETAILS OF PROCEDURE: The patient was brought to the Operating Room in a fasting, nonsedated state. A written informed consent was obtained and placed in the patient's permanent medical record. The patient was prepped and draped in the usual sterile fashion. The area of the left infraclavicular fossa was scrubbed with chlorhexidine. Local anesthetic was administered into the left infraclavicular fossa. 20 mL of Marcaine and bupivacaine. The pocket was constructed in the usual manner over the prepectoralis fascia. The pocket was expanded and made large enough to accommodate the implantable cardiac defibrillator. Access to central circulation was obtained x1 using the modified Seldinger technique and fluoroscopic guidance. The guidewire was then passed into the area of the inferior vena cava. Over the guidewire, a dilator and sheath was placed and the dilator and guidewire were subsequently removed. The right ventricular lead was taken to the outflow tract. We mapped several septal locations including the mid to low septum and was found to have low impedances, variant R waves and poor capture thresholds there. Ultimately, we moved the RV lead to the apex. There sensed R waves are 8.8 mV, slew rate of 2.9 V/sec, impedance of 486 ohms and a threshold of 1.7 V at 0.5 ms. The lead was then secured to the prepectoralis fascia with an 0 silk suture and adequate  redundancy was confirmed. The pocket was then irrigated with copious amounts of gentamicin. Adequate hemostasis was obtained. The pocket was closed with running layers of 2-0 and 3-0 Vicryl and the subcuticular layer with 4-0 Vicryl. Steri-Strips and then sterile gauze and OpSite were applied over the wound.   SUMMARY OF IMPLANTED HARDWARE: The patient received a Medtronic single-chamber implantable cardiac defibrillator, model Protecta [id number], serial number [id number]. The right ventricular lead was a Medtronic [id number] Sprint Quattro, serial number [id number], all implanted 09/01/2012. Back-up pacing was mode VVI with a low rate of 40, output 3.5 and 0.4 volts. VF detection was at rates greater than 200 beats per minute with a long detection delay of 30/40. There were no complications at the conclusion of the procedure. There was a monitor zone that was turned at 167 beats per minute. Estimated blood loss was less than 10 milliliters. The patient will follow up with his primary care provider in two weeks for a wound check and with his primary cardiologist in three months for his first device assessment.   ____________________________ Anna GenreKevin L. Maisie Fushomas, MD klt:ap D: 09/01/2012 15:53:11 ET T: 09/02/2012 10:02:48 ET JOB#: [id number]  cc: Caryn BeeKevin L. Maisie Fushomas, MD, <Dictator> Sharion SettlerKEVIN L Nashia Remus MD ELECTRONICALLY SIGNED 09/29/2012 15:01

## 2015-04-01 NOTE — Consult Note (Signed)
PATIENT NAME:  [name], [name] MR#:  [id number] DATE OF BIRTH:  1961/01/15  DATE OF CONSULTATION:  09/01/2012  REFERRING PHYSICIAN:  Dr. Darrold JunkerParaschos CONSULTING PHYSICIAN:  Letta PateJohn B. Danne HarborWalker III, MD  HISTORY OF PRESENT ILLNESS: [name] is a 54 year old white gentleman previously followed by Dr. Daniel NonesBert Klein who is admitted at this time from [facility] for implantable cardiac defibrillator placement. Consultation was called regarding diabetic insulin management. The patient had been transferred to [facility] earlier this year following an infarction in April and a stroke in June. Since that time he has been followed by Dr. Dorothey Basemanavid Bronstein whose last note was retrieved and reviewed.   PAST MEDICAL HISTORY:    1. Coronary artery disease. 2. Hypertension. 3. Diabetes mellitus. 4. Hyperlipidemia. 5. Depression. 6. Chronic low back pain. 7. Sleep apnea.  8. Previous cerebrovascular accident.  9. Previous myocardial infarction. 10. History of osteomyelitis.   MEDICATIONS AT [facility]:  1. Metoprolol 50 mg b.i.d.  2. Flomax 0.4 mg at bedtime.  3. Lovastatin 20 mg at bedtime.  4. Aspirin 81 mg daily.  5. Digoxin 0.125 mg daily.  6. Lisinopril 40 mg daily.  7. Doxycycline 100 mg b.i.d.  8. Warfarin 4 mg at bedtime.  9. Norco 5/325, one every four hours as needed.  10. NPH insulin 20 units in the morning and 13 units in the evening.  11. Sliding scale insulin. 12. Lasix 40 mg b.i.d.  13. Melatonin 6 mg at bedtime.  14. Trazodone 100 mg at bedtime. 15. Spironolactone 25 mg daily.  16. Diazepam 5 mg every eight hours as needed.   ALLERGIES: Penicillin.   The patient is alert and in no acute distress. He is status post implantable cardiac defibrillator placement. The patient unfortunately has no idea what he has been on in the past for his diabetes. He did used to see Dr. Silver HugueninAileen Miller, but had not seen her in several years. He was most recently followed by Dr. Daniel NonesBert Klein, but had not  seen him in several months as he has been cared for by Dr. Terance HartBronstein.   RECOMMENDATIONS: The patient's orders were reviewed and his insulin orders agree with the most recent orders from Dr. Terance HartBronstein. The patient's last blood sugar was 176. We will continue the current orders, but will continue to follow blood sugars until he is discharged back to [facility].    ____________________________ Letta PateJohn B. Danne HarborWalker III, MD jbw:ap D: 09/01/2012 19:15:32 ET T: 09/02/2012 07:01:30 ET JOB#: [id number]  cc: Jonny RuizJohn B. Danne HarborWalker III, MD, <Dictator> Elmo PuttJOHN B WALKER III MD ELECTRONICALLY SIGNED 09/03/2012 15:58

## 2015-04-05 NOTE — Consult Note (Signed)
Brief Consult Note: Diagnosis: UGI bleed.   Patient was seen by consultant.   Consult note dictated.   Recommend to proceed with surgery or procedure.   Comments: 1.) Likely UGI bleed - stop coumadin - give prbc - give 2 unit FFP given INR 3 and still bleeding - start PPI bolus and drip - monitor Hgb and hemodynamics closely.  - EGD once INR < 2.  Electronic Signatures: Dow Adolphein, Matthew (MD)  (Signed 12-Apr-15 13:52)  Authored: Brief Consult Note   Last Updated: 12-Apr-15 13:52 by Dow Adolphein, Matthew (MD)

## 2015-04-05 NOTE — Consult Note (Signed)
PATIENT NAME:  [name], [name] MR#:  [id number] DATE OF BIRTH:  1961/07/11  DATE OF CONSULTATION:  03/24/2014  REFERRING PHYSICIAN:  Jonny RuizJohn B. Danne HarborWalker III, MD CONSULTING PHYSICIAN:  Charles AdolphMatthew Sloane Junkin, MD  REASON FOR THE CONSULT: Melena, anemia.   HISTORY OF PRESENT ILLNESS: [name] is a 54 year old male with a history of coronary disease, CHF, atrial fibrillation on Coumadin who GI is currently consulted on for evaluation of melena. [name] actually presented to the hospital in the setting of fevers, chills, and a right foot ulcer infection.   However, during the hospitalization, it came to light that he has been having black bowel movements consistent with melena. [name] reports that he has been having these bowel movements for about 1 week. The frequency has been about 3-4 times a day and they are black each time.   Prior to this, he has not had any trouble with GI bleeding. Had not seen any melena or rectal bleeding or hematemesis prior to this. He denies ever having an EGD or a colonoscopy. He does not take any NSAIDS, drink any alcohol, and he does not report having any trouble with reflux. epigastric pain.  PAST MEDICAL HISTORY:  1. Coronary disease.  2. CHF.  3. Afib on Coumadin.  4. CVA.  5. Hypertension.   MEDICATIONS:  1. Spironolactone 25 twice daily.  2. Aspirin 81 daily.  3. Norco 5/325 as needed.  4. Lisinopril 40 mg daily.  5. Digoxin 1.25 daily.  6. Coumadin 4 mg daily.  7. Diazepam 5 mg q. 8 p.r.n.  8. Trazodone 100 mg at bedtime.  9. Zofran 4 mg q. 6 p.r.n.  10. Lovastatin 20 mg at bedtime.  11. Coreg 6.25 b.i.d.  12. Lasix 40 mg b.i.d.  13. Feosol 325 mg daily.  14. Prilosec 40 mg twice daily.  15. NPH 22 units in the morning and 16 units in the evening.  16. Insulin sliding scale.   REVIEW OF SYSTEMS: A 10 system review was conducted. It is negative except as stated in the history of present illness.   SOCIAL HISTORY: He denies any alcohol or tobacco to me.    FAMILY HISTORY: No family history of stomach cancer, esophageal cancer, colon cancer, or other GI malignancy that he is aware of.   PHYSICAL EXAMINATION:  VITAL SIGNS: Currently, his temperature is 98.1, pulse is 77, respirations are 20, blood pressure is 104/69, pulse oximetry is 95% on room air.  GENERAL: Alert and oriented x 4.  No acute distress. Appears stated age. HEENT: Normocephalic/atraumatic. Extraocular movements are intact. Anicteric. NECK: Soft, supple. JVP appears normal. No adenopathy. CHEST: Clear to auscultation. No wheeze or crackle. Respirations unlabored. HEART: Regular. No murmur, rub, or gallop.  Normal S1 and S2. ABDOMEN: Soft, nontender, nondistended.  Normal active bowel sounds in all 4 quadrants.  No organomegaly. No masses. EXTREMITIES: No swelling, well perfused. SKIN: No rash or lesion. Skin color, texture, turgor normal. NEUROLOGICAL: Grossly intact. PSYCHIATRIC: Normal tone and affect. MUSCULOSKELETAL: No joint swelling or erythema.   LABORATORY DATA: White count 6.1, hemoglobin 6.8, hematocrit 22.7, platelets 184,000. Marland Kitchen.  His INR is 3.2.   ASSESSMENT: Melena, anemia: It does seem as though [name] does have active bleeding at this time based on his frequent melena and on his downward trending hemoglobin.   RECOMMENDATIONS: At this time, I will initiate a PPI drip. I will also give 2 units of FFP given that his INR currently is 3.2. I agree with getting  packed red blood cells as he is doing and a hemoglobin following that.   We do need to maintain his INR at less than 1.5 in the setting of active bleeding and maintain his hemoglobin greater than 7.   I will plan to perform an upper endoscopy once his INR is in the 1.7 range or so or unless it becomes emergent before then. In the meantime, we should hold all anticoagulation. He can stay n.p.o. after midnight tonight in case we will be able to do the upper endoscopy tomorrow.    Thank you for this consult.     ____________________________ Charles Adolph, MD mr:dd D: 03/24/2014 23:08:02 ET T: 03/25/2014 00:29:10 ET JOB#: [id number]  cc: Charles Adolph, MD, <Dictator> Kathalene Frames MD ELECTRONICALLY SIGNED 04/09/2014 17:03

## 2015-04-05 NOTE — Discharge Summary (Signed)
PATIENT NAME:  [name], [name] MR#:  [id number] DATE OF BIRTH:  August 29, 1961  DATE OF ADMISSION:  03/23/2014 DATE OF DISCHARGE:  04/06/2014  FINAL DIAGNOSES: 1.  Gastrointestinal bleeding.  2.  Acute gastrointestinal blood, loss requiring transfusion.  3.  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  4.  Diabetes mellitus, uncontrolled.  5.  History of transient atrial fibrillation with prior cerebrovascular accident 2013.  6.  Hypertension.  7.  Chronic left-sided systolic congestive heart failure.  8.  Chronic kidney disease, stage 3.   HISTORY AND PHYSICAL: Please see dictated admission history and physical.   HOSPITAL COURSE: The patient was admitted with evidence of osteomyelitis of the right foot, but also found to have evidence of GI bleeding and  acute blood loss anemia. He required transfusion for blood support. He underwent endoscopy and colonoscopy, without source found.  The bleeding appeared to stop. He was taken off all anticoagulation. He was evaluated by both podiatry and vascular surgery, recommend surgical intervention. This was postponed, until we can make sure his bleeding appeared to be stabilized, and he was placed on empiric antibiotics. Cultures were obtained, which grew out Proteus, as well as Methicillin-resistant Staph. He, ultimately, went to amputation of the fifth ray of the right foot,  and cultures at that time revealed Proteus, as well as evidence of vancomycin-resistant enterococcus. He was further seen by vascular surgery, and he underwent angioplasty of the right leg arteries to try to improve healing.   Infectious disease was asked to see the patient and patient had a PICC line placed with the recommendation for him to be on daptomycin and Cipro for coverage of the Proteus, MRSA and  vancomycin-resistant enterococcus for at least another two weeks. He tolerated this without issue. He required  assistance with ambulation and requires an OrthoWedge shoe to be able to bear weight, and so it was clear he would not be able to return home. A bed search began and a bed was found for patient to go for skilled rehabilitation. At this time, he will be discharged to that bed in stable condition. His physical activity should be up with assistance while wearing an OrthoWedge shoe. He should follow a carbohydrate-controlled diet. We recommend that he be weighed daily, with physician called for more than 2 pounds gained in one day or 5 pounds in one week, or increasing signs or symptoms or heart failure. He will need fingerstick blood sugars performed 3 times a day and sliding scale used as directed. He will need a MET-B, CBC in one week with results to the nursing home physician. He will need to continue routine PICC line care, and this may be removed when the antibiotics are completed. Follow-up will be arranged with Dr. Gustavo Lah, Dr. Lucky Cowboy and Dr. Elvina Mattes.   He will, otherwise, follow up with the nursing home physician.  Occupational therapy and physical therapy should evaluate and treat the patient. Dressing changes to the right foot should be performed daily and as needed.   DISCHARGE MEDICATIONS: 1.  Carvedilol 12.5 mg p.o. b.i.d.  2.  Lovastatin 40 mg p.o. at bedtime.  3.  Multivitamin p.o. daily.  4.  Refresh Tears 2 drops to the affected eye 3 times a day as needed for dry eye. 5.  Cetirizine 10 mg p.o. daily for allergic rhinitis.  6.  Ferrous sulfate 160 mg p.o. daily.  7.  Norco 7.5/325, 1 p.o. q.4 hours as needed for severe pain.  8.  Lisinopril 10 mg p.o. daily.  9.  Digoxin 0.125 mg p.o. daily.  10.  Trazodone 100 mg p.o. at bedtime.   11.  Daptomycin 600 mg IV q.24 hours x12 days.  12.  Pantoprazole 40 mg p.o. b.i.d.  13.  Bisacodyl suppositories rectally daily as needed for constipation. 14.  Cipro 500 mg p.o. b.i.d. x 12 days.  15.  Sliding scale insulin. 16.  Insulin 70/30, 23 units  subcutaneous in a.m., 16 units subcutaneous in p.m.   No further anticoagulation will be administered secondary to recent gastrointestinal bleeding, and the patient is aware of risks of this, given his prior stroke and atrial fibrillation. Fortunately, he remained in normal sinus rhythm throughout this hospitalization.   ____________________________ Adin Hector, MD bjk:aj D: 04/06/2014 11:46:06 ET T: 04/06/2014 23:20:49 ET JOB#: [id number]  cc: Adin Hector, MD, <Dictator> Ramonita Lab MD ELECTRONICALLY SIGNED 04/08/2014 7:38

## 2015-04-05 NOTE — Consult Note (Signed)
PATIENT NAME:  [name], [name] MR#:  [id number] DATE OF BIRTH:  [birth year]  DATE OF CONSULTATION:  04/03/2014  REFERRING PHYSICIAN:  Adin Hector, MD CONSULTING PHYSICIAN:  Cheral Marker. Ola Spurr, MD  REASON FOR CONSULTATION: Osteomyelitis.   HISTORY OF PRESENT ILLNESS: This is a pleasant 54 year old gentleman with history of diabetes as well as peripheral vascular disease, who was admitted on April 11th with infection of his right foot where he had initially had an ulcer. He also had fevers and chills. He was found to have underlying osteomyelitis. He was seen by podiatry and underwent resection of the  fifth metatarsal head and distal shaft and antibiotic bead placement April 18th by Dr. Elvina Mattes. He then underwent revascularization by Dr. Lucky Cowboy on April 20th. He has been maintained on antibiotics and has been clinically stable. We are consulted for further antibiotic management.   PAST MEDICAL HISTORY: 1.  Diabetes.  2.  Peripheral vascular disease.  3.  Coronary artery disease.  4.  Paroxysmal atrial fib.  5.  Hypertension.  6.  CVA.   PAST SURGICAL HISTORY:  None.   SOCIAL HISTORY: He lives in a group home setting. Does not smoke, drink or use drugs.   FAMILY HISTORY: Positive for coronary artery disease.   REVIEW OF SYSTEMS: Eleven systems reviewed and negative except as per history of present illness.   ALLERGIES: THE PATIENT IS ALLERGIC TO PENICILLIN.   ANTIBIOTICS SINCE ADMISSION: Include cefepime from admission to April 20th and vancomycin from admission until April 20th. Currently he is on Bactrim.   OTHER MEDICATIONS: Include Norco, Coreg, Zyrtec, digoxin, insulin, Zofran, pantoprazole, trazodone, bisacodyl, lisinopril.   PHYSICAL EXAMINATION: VITAL SIGNS: Temperature 97.3, pulse 74, blood pressure 134/79, respirations 20, sat 98%.  GENERAL: He is overweight, lying in bed in no acute distress but somewhat disheveled.  HEENT: Pupils equal, round and reactive to light and  accommodation. Extraocular movements are intact. Sclerae are anicteric. Oropharynx is clear.   NECK: Supple.  HEART: Regular.  LUNGS: Clear.  ABDOMEN: Soft, nontender, nondistended.  EXTREMITIES: He has on his right leg 1+ edema. He has postoperative sutures in  place on the lateral foot. There is some erythema and warmth. There are antibiotic beads in place. Some of those are extruding through the incision which is relatively loose. There is mild odor to it.   LABORATORY, DIAGNOSTIC AND RADIOLOGICAL DATA: Micro data is reviewed from admission. Blood cultures x 2 have been negative from April 11th. Superficial wound culture grew Proteus mirabilis heavy growth and light growth of MRSA. C. difficile testing April 15th is negative. Wound culture from the day of operation listed as the right fifth metatarsal grew light growth of Proteus as well as light growth of Enterococcus faecium and Enterococcus faecalis. The Enterococcus were both resistant to ampicillin. The faecalis was sensitive to vancomycin but vancomycin sensitivity is still pending on the Enterococcus faecium. White blood count currently is 6.7, hemoglobin 7.8, platelets 190. Renal function shows a creatinine of 1.57. He had worsening renal function following admission with a creatinine at 1.73 at its peak. Sedimentation rate on April 12th was 40 and CRP was 10.6. CT scan done 04/11 shows cortical disruption and irregular erosion of the fifth metatarsal head adjacent to a prominent skin ulcer remaining concerning for osteomyelitis. There is generalized soft tissue edema throughout the foot and ankle.   IMPRESSION:  A 54 year old with diabetes and peripheral vascular disease admitted with a 77-monthhistory of progressive lateral foot ulcer that progressed  to osteomyelitis and cellulitis. He is now status post debridement and resection of the infected bone as well as attempted revascularization. Cultures are growing Proteus mirabilis,  methicillin-resistant Staphylococcus aureus, Enterococcus faecalis and faecium.  Final vancomycin sensitivities are pending on the Enterococcus. He has clinically responded to vancomycin and cefepime. The wound has a somewhat dusky appearance to it but is still intact. There are antibiotic beads in place.   RECOMMENDATION: 1.  At this point, I would repeat an ESR and CRP. Having seen the wound for the first time today, I am worried that it may worsen with oral antibiotics as opposed to IV. He presumably has both macrovascular and microvascular disease and may benefit in the end from IV antibiotic therapy for a few weeks.  2.  If his ESR and CRP are elevated or are increasing, or if the wound worsens, I would place a PICC and treat with at least 2 weeks of IV antibiotics. If the inflammatory markers are still stably low, he could probably continue with Bactrim to cover the MRSA and the Proteus. We could consider the use of linezolid for the Enterococcus although I am not sure how much of a contributor to the infection this is. If the Enterococcus is vancomycin resistant then it has basically been untreated since admission but the infection does seem to be responding potentially.   Thank you for the consult. I will be glad to follow with you.   ____________________________ Cheral Marker. Ola Spurr, MD dpf:cs D: 04/03/2014 20:00:14 ET T: 04/03/2014 20:21:01 ET JOB#: [id number]  cc: Cheral Marker. Ola Spurr, MD, <Dictator> Leon Montoya Ola Spurr MD ELECTRONICALLY SIGNED 04/10/2014 11:50

## 2015-04-05 NOTE — H&P (Signed)
PATIENT NAME:  [name], [name] MR#:  [id number] DATE OF BIRTH:  02/19/1961  DATE OF ADMISSION:  03/23/2014  REFERRING PHYSICIAN:  Physician Assistant, Eric Komar.   PRIMARY CARE PHYSICIAN:  Dr. Bert Klein.   CHIEF COMPLAINT:  Fevers, chills.   HISTORY OF PRESENT ILLNESS:  A 54-year-old Caucasian gentleman with history of coronary artery disease, ischemic cardiomyopathy, paroxysmal atrial fibrillation on anticoagulation as well as diabetes, presenting with fevers and chills.  Describes two day duration of fevers, chills, generalized malaise, has noted drainage from a right foot ulcer, which is purulent, yellowish discharge with a foul odor, states this wound has been present for approximately one month total in duration.  He also denies having some nausea, vomiting and diarrhea for the same day duration.  Denies any recent sick contacts, though he does live in a group facility.  He states that it is possible.  Currently he is without complaints.   REVIEW OF SYSTEMS:  CONSTITUTIONAL:  Positive for subjective fevers, chills, fatigue, weakness.  EYES:  No blurry vision, double vision, eye pain.  EARS, NOSE, THROAT:  Denies tinnitus, ear pain, hearing loss.  RESPIRATORY:  No cough, shortness of breath.  CARDIOVASCULAR:  Denies chest pain, palpitations, edema.  GASTROINTESTINAL:  Positive for nausea, vomiting, diarrhea.  Denies any abdominal pain.  GENITOURINARY:  Denies dysuria, hematuria.  ENDOCRINE:  Denies nocturia or thyroid problems.  HEMATOLOGIC AND LYMPHATIC:  Denies easy bruising, bleeding.  SKIN:  Denies any rashes.  Positive for foot ulcerations described above.  MUSCULOSKELETAL:  Denies pain in neck, back, shoulder, knees, hips or arthritic symptoms.  NEUROLOGIC:  Denies paralysis, paresthesias.  Positive for decreased sensation in feet.  PSYCHIATRIC:  Denies anxiety or depressive symptoms.  Otherwise, full review of systems performed by me is negative.   PAST MEDICAL HISTORY:   Hypertension, coronary artery disease, congestive heart failure, ischemic cardiomyopathy, paroxysmal atrial fibrillation with history of stroke.   SOCIAL HISTORY:  Denies any alcohol, tobacco or drug usage.   FAMILY HISTORY:  Positive for coronary artery disease.   ALLERGIES:  PENICILLIN, WHICH CAUSES A RASH.   MEDICATIONS:  Spironolactone 25 mg by mouth twice daily, aspirin 81 mg by mouth daily, Norco 5/325 mg by mouth q. 4 hours as needed for pain, lisinopril 40 mg by mouth daily, digoxin 1.25 mcg by mouth daily, Coumadin 4 mg by mouth daily, diazepam 5 mg by mouth q. 8 hours as needed for anxiety, trazodone 100 mg by mouth at bedtime, Zofran 4 mg every six hours as needed for nausea, vomiting, lovastatin 20 mg by mouth at bedtime, Coreg 6.25 mg by mouth twice daily, Lasix 40 mg by mouth twice daily, Feosol 325 mg by mouth daily, Prilosec 40 mg by mouth twice daily, NPH 23 units in the morning, 16 units in the evening, as well as insulin sliding scale.   PHYSICAL EXAMINATION: VITAL SIGNS:  Temperature 98.2, heart rate 97, respirations 18, blood pressure 124/76, saturating 96% on room air.  Weight 81.2 kg, BMI 24.3.  GENERAL:  Chronically ill-appearing Caucasian gentleman currently in no acute distress.  HEAD:  Normocephalic, atraumatic.  EYES:  Pupils equal, round, reactive to light.  Extraocular muscles intact.  No scleral icterus.  MOUTH:  Moist mucous membranes.  Dentition intact.  No abscess noted.  EARS, NOSE, THROAT:  Throat clear without exudates.  No external lesions.  NECK:  Supple.  No thyromegaly.  No nodules.  No JVD.  PULMONARY:  Clear to auscultation bilaterally without wheeze, rubs or   rhonchi.  No use of accessory muscles.  Good respiratory effort.  CHEST:  Nontender to palpation.  CARDIOVASCULAR:  S1, S2, regular rate and rhythm.  No murmurs, rubs or gallops.  No edema.  Pedal pulse 2+ bilaterally.  GASTROINTESTINAL:  Soft, nontender, nondistended.  No masses.  Positive bowel  sounds.  No hepatosplenomegaly.  MUSCULOSKELETAL:  No swelling, clubbing.  There is 1+ edema on the right lower extremity.  Range of motion full in all extremities.  NEUROLOGIC:  Cranial nerves II through XII intact.  No gross focal neurological deficits.  Sensation diminished over the feet bilaterally.  Reflexes intact.  SKIN:  There is an approximate 4 x 2 cm ulceration with necrotic base of the lateral portion of the right foot with purulent discharge surrounding area of erythema.  Otherwise, no further ulcerations, lesions, rash or cyanosis.  Skin warm, dry.  Turgor intact.  PSYCHIATRIC:  Mood and affect blunted.  Awake, alert, oriented x 3.  Insight and judgment intact.   LABORATORY DATA:  Sodium 137, potassium 3.3, chloride 101, bicarb 29, BUN 80, creatinine 2.23 which is around his baseline, glucose 193.  LFTs:  Albumin of 3.3, alk phos of 269; otherwise within normal limits.  WBC 8.3, hemoglobin 8.1, platelets of 239, INR of 2.1.  Urinalysis negative for evidence of infection.  Right foot x-ray reveals findings consistent with osteomyelitis with associated deep soft tissue ulcer.   ASSESSMENT AND PLAN:  A 54 year old Caucasian gentleman with a history of diabetes, presenting with fevers, chills.  1.  Osteomyelitis of right foot.  Blood cultures.  Antibiotic coverage with vancomycin and cefepime for methicillin resistant staphylococcus aureus and gram-negative coverage.  Orthopedic consult.  Provide pain control as required.  However, currently not in any pain.  2.  Type 2 diabetes.  Continue his NPH and add insulin sliding scale.  3.  Hypertension with ischemic cardiomyopathy.  Continue Coreg, digoxin and ACE inhibitors as well as aspirin.  4.  Gastroesophageal reflux disease.  Proton pump inhibitor therapy.  5.  Paroxysmal atrial fibrillation on warfarin therapy, currently in normal sinus rhythm.  We will hold warfarin tonight in anticipation of possible surgical intervention.  6.  Venous  thromboembolism prophylaxis, currently therapeutic on warfarin. 7.  CODE STATUS:  THE PATIENT IS FULL CODE.   TIME SPENT:  45 minutes.     ____________________________ Aaron Mose. Hower, MD dkh:ea D: 03/23/2014 23:52:09 ET T: 03/24/2014 01:35:07 ET JOB#: [id number]  cc: Aaron Mose. Hower, MD, <Dictator> DAVID Woodfin Ganja MD ELECTRONICALLY SIGNED 03/24/2014 3:37

## 2015-04-05 NOTE — Consult Note (Signed)
Details:   - GI note:  INR 1.8 today.  Plan for EGD tomorrow.   NPO after midnight.   If EGD is negative will need colonoscopy.   Electronic Signatures: Dow Adolphein, Matthew (MD)  (Signed 16-Apr-15 16:52)  Authored: Details   Last Updated: 16-Apr-15 16:52 by Dow Adolphein, Matthew (MD)

## 2015-04-05 NOTE — Op Note (Signed)
PATIENT NAME:  Charles LutesBROWN, Kushal M MR#:  [id number] DATE OF BIRTH:  Oct 09, 1961  DATE OF PROCEDURE:  04/04/2014  PREOPERATIVE DIAGNOSES:  1. Osteomyelitis.  2. Peripheral arterial disease.  3. Diabetes.   POSTOPERATIVE DIAGNOSES:  1. Osteomyelitis.  2. Peripheral arterial disease.  3. Diabetes.   PROCEDURES:  1. Ultrasound guidance for vascular access to right basilic vein.  2. Fluoroscopic guidance for placement of catheter.  3. Insertion of peripherally inserted central venous catheter, double lumen, right arm.  SURGEON: Dr. Festus BarrenJason Dew  ANESTHESIA: Local.   BLOOD LOSS: Minimal.   INDICATION FOR PROCEDURE:  A 54 year old white male with osteomyelitis. He has undergone revascularization. He will require extended IV therapy and PICC line is requested.   DESCRIPTION OF PROCEDURE: The patient's right arm was sterilely prepped and draped, and a sterile surgical field was created. The right basilic vein was accessed under direct ultrasound guidance without difficulty with a micropuncture needle and permanent image was recorded. 0.018 wire was then placed into the superior vena cava. Peel-away sheath was placed over the wire. A single lumen peripherally inserted central venous catheter was then placed over the wire and the wire and peel-away sheath were removed. The catheter tip was placed into the superior vena cava and was secured at the skin at 39 cm with a sterile dressing. The catheter withdrew blood well and flushed easily with heparinized saline. The patient tolerated procedure well.    ____________________________ Annice NeedyJason S. Dew, MD jsd:dd D: 04/18/2014 16:10:55 ET T: 04/19/2014 02:49:45 ET JOB#: [id number]  cc: Annice NeedyJason S. Dew, MD, <Dictator> Annice NeedyJASON S DEW MD ELECTRONICALLY SIGNED 04/25/2014 15:31

## 2015-04-05 NOTE — Consult Note (Signed)
Chief Complaint:  Subjective/Chief Complaint seen for anemia, found heme positive.  initial prep (golightly) poorly tolerated, was given some magnesium citrate which was better tolerated.   no nausea or abdominalpain.   VITAL SIGNS/ANCILLARY NOTES: **Vital Signs.:   24-Apr-15 04:59  Temperature Temperature (F) 98  Celsius 36.6  Temperature Source oral  Pulse Pulse 79  Respirations Respirations 20  Systolic BP Systolic BP 540  Diastolic BP (mmHg) Diastolic BP (mmHg) 88  Mean BP 103  Pulse Ox % Pulse Ox % 92  Pulse Ox Activity Level  At rest  Oxygen Delivery Room Air/ 21 %    14:18  Vital Signs Type Routine  Temperature Temperature (F) 98.2  Celsius 36.7  Temperature Source oral  Pulse Pulse 80  Respirations Respirations 20  Systolic BP Systolic BP 086  Diastolic BP (mmHg) Diastolic BP (mmHg) 82  Mean BP 101  Pulse Ox % Pulse Ox % 98  Pulse Ox Activity Level  At rest  Oxygen Delivery Room Air/ 21 %  *Intake and Output.:   24-Apr-15 13:45  Stool  large loose stool.   Brief Assessment:  Cardiac Regular   Respiratory clear BS   Gastrointestinal details normal Soft  Nontender  Nondistended  Bowel sounds normal   Lab Results:  Routine Chem:  24-Apr-15 05:31   Result Comment LABS - This specimen was collected through an   - indwelling catheter or arterial line.  - A minimum of 66mls of blood was wasted prior    - to collecting the sample.  Interpret  - results with caution.  Result(s) reported on 05 Apr 2014 at 06:10AM.  Glucose, Serum 74  BUN  37  Creatinine (comp)  1.53  Sodium, Serum 140  Potassium, Serum 4.8  Chloride, Serum  111  CO2, Serum 21  Calcium (Total), Serum  8.1  Anion Gap 8  Osmolality (calc) 287  eGFR (African American)  59  eGFR (Non-African American)  51 (eGFR values <11mL/min/1.73 m2 may be an indication of chronic kidney disease (CKD). Calculated eGFR is useful in patients with stable renal function. The eGFR calculation will not be  reliable in acutely ill patients when serum creatinine is changing rapidly. It is not useful in  patients on dialysis. The eGFR calculation may not be applicable to patients at the low and high extremes of body sizes, pregnant women, and vegetarians.)  Routine Coag:  23-Apr-15 10:11   INR 1.3 (INR reference interval applies to patients on anticoagulant therapy. A single INR therapeutic range for coumarins is not optimal for all indications; however, the suggested range for most indications is 2.0 - 3.0. Exceptions to the INR Reference Range may include: Prosthetic heart valves, acute myocardial infarction, prevention of myocardial infarction, and combinations of aspirin and anticoagulant. The need for a higher or lower target INR must be assessed individually. Reference: The Pharmacology and Management of the Vitamin K  antagonists: the seventh ACCP Conference on Antithrombotic and Thrombolytic Therapy. PYPPJ.0932 Sept:126 (3suppl): [id number]. A HCT value >55% may artifactually increase the PT.  In one study,  the increase was an average of 25%. Reference:  "Effect on Routine and Special Coagulation Testing Values of Citrate Anticoagulant Adjustment in Patients with High HCT Values." American Journal of Clinical Pathology 2006;126:400-405.)  Routine Hem:  24-Apr-15 05:31   WBC (CBC) 7.6  RBC (CBC)  3.08  Hemoglobin (CBC)  7.8  Hematocrit (CBC)  24.6  Platelet Count (CBC) 220  MCV 80  MCH  25.3  MCHC  31.7  RDW  18.8  Neutrophil % 79.9  Lymphocyte % 6.6  Monocyte % 9.9  Eosinophil % 2.2  Basophil % 1.4  Neutrophil # 6.1  Lymphocyte #  0.5  Monocyte # 0.8  Eosinophil # 0.2  Basophil # 0.1   Assessment/Plan:  Assessment/Plan:  Assessment 1) anemia, heme positive stool, likely multifactorial, ACD/gi loss.  2) multiple medical problems with osteomyelitis, AF, CVA,  dm, htn, cks   Plan 1) for colonoscopy today.  I ahve discussed the risks benefits and complicatiosn of  colonoscopy to include not limited to bleeding infection perforation and sedation and he wishes to proceed.   Electronic Signatures for Addendum Section:  Loistine Simas (MD) (Signed Addendum 24-Apr-15 15:53)  DRE to check prep, relatively clear, will take to endo for proceedure.   Electronic Signatures: Loistine Simas (MD)  (Signed 24-Apr-15 15:48)  Authored: Chief Complaint, VITAL SIGNS/ANCILLARY NOTES, Brief Assessment, Lab Results, Assessment/Plan   Last Updated: 24-Apr-15 15:53 by Loistine Simas (MD)

## 2015-04-05 NOTE — Consult Note (Signed)
Chief Complaint:  Subjective/Chief Complaint patietn seen for anemia and reported black stools.  denies n/v or abdominal pain.  On further questioning of the patient today, he stated the stools were not actually black, but [place].  no family history of colon cancer.  S/P debridement proceedure per Podiatry.   VITAL SIGNS/ANCILLARY NOTES: **Vital Signs.:   18-Apr-15 13:23  Vital Signs Type Routine  Temperature Temperature (F) 97.5  Celsius 36.3  Temperature Source oral  Respirations Respirations 20  Systolic BP Systolic BP 121  Diastolic BP (mmHg) Diastolic BP (mmHg) 79  Mean BP 93  Pulse Ox % Pulse Ox % 97  Pulse Ox Activity Level  At rest  Oxygen Delivery Room Air/ 21 %   Brief Assessment:  Cardiac Irregular   Respiratory clear BS   Gastrointestinal details normal Soft  Nontender  Nondistended  No masses palpable  Bowel sounds normal   Lab Results: Routine Chem:  18-Apr-15 05:57   Glucose, Serum 98  BUN  36  Creatinine (comp)  1.53  Sodium, Serum 142  Potassium, Serum 4.2  Chloride, Serum  113  CO2, Serum  20  Calcium (Total), Serum  8.0  Anion Gap 9  Osmolality (calc) 291  eGFR (African American)  59  eGFR (Non-African American)  51 (eGFR values <60mL/min/1.73 m2 may be an indication of chronic kidney disease (CKD). Calculated eGFR is useful in patients with stable renal function. The eGFR calculation will not be reliable in acutely ill patients when serum creatinine is changing rapidly. It is not useful in  patients on dialysis. The eGFR calculation may not be applicable to patients at the low and high extremes of body sizes, pregnant women, and vegetarians.)  Routine Hem:  18-Apr-15 05:57   WBC (CBC) 7.4  RBC (CBC)  3.09  Hemoglobin (CBC)  7.8  Hematocrit (CBC)  24.6  Platelet Count (CBC) 170  MCV 80  MCH  25.1  MCHC  31.6  RDW  18.2  Neutrophil % 77.3  Lymphocyte % 8.0  Monocyte % 10.7  Eosinophil % 3.1  Basophil % 0.9  Neutrophil # 5.7   Lymphocyte #  0.6  Monocyte # 0.8  Eosinophil # 0.2  Basophil # 0.1 (Result(s) reported on 30 Mar 2014 at 06:47AM.)   Assessment/Plan:  Assessment/Plan:  Assessment 1) anemia-probably multifactorial.  patietn reports seeing black stools but after discussion today stated they were actually [place]?  Of note patietn has been heme negative times 2 since admission, EGD normal.   2) diarrhea-positive c diff-cdh antigen, negative toxin a and b, negative pcr for toxigenic c. diff. -no treatment indicated 3) osteomyelitis-s.p toe amputation. -patietn to have angio monday for possible placement of iliac stents.   Plan 1) check erythropoetin level 2) since vascular is scheduled for monday, will need to hold plans for colonoscopy for at least several days to avoid possible problems with closure.  If vascular proceedure is delayed for some reason, could proceed after a day to allow for colon prep.  following   Electronic Signatures: Skulskie, Martin (MD)  (Signed 18-Apr-15 20:10)  Authored: Chief Complaint, VITAL SIGNS/ANCILLARY NOTES, Brief Assessment, Lab Results, Assessment/Plan   Last Updated: 18-Apr-15 20:10 by Skulskie, Martin (MD) 

## 2015-04-05 NOTE — Consult Note (Signed)
PATIENT NAME:  [name], [name] MR#:  [id number] DATE OF BIRTH:  [birth year]  DATE OF CONSULTATION:  03/24/2014  REFERRING PHYSICIAN:   CONSULTING PHYSICIAN:  Maebelle Munroe, MD  CHIEF COMPLAINT: Right foot drainage with concern for osteomyelitis of the right foot.   HISTORY OF PRESENT ILLNESS: Mr. Ricciardi is a 54 year old male with a known history of diabetes who reports approximately 46-monthhistory of right lateral foot pain and recent drainage. He was brought to the Emergency Department for fevers and chills. He has an extensive past medical history. He also has significant diabetic neuropathy of his foot. He currently lives in a group facility. He has been empirically started on vancomycin and cefepime.   PAST MEDICAL HISTORY: Remarkable for atrial fibrillation, cardiomyopathy, coronary artery disease, ischemic cardiomyopathy, diabetes. There is also a history of CVA.   CURRENT ALLERGIES: PENICILLIN.   SOCIAL HISTORY: No alcohol, tobacco or drug use. He currently lives in a group home.   FAMILY HISTORY: Positive for coronary artery disease.   CURRENT MEDICATIONS: Include spironolactone, aspirin, Norco, lisinopril, digoxin, Coumadin, trazodone, diazepam, Zofran, lovastatin, Coreg, Lasix, Feosol, Prilosec, NPH insulin.   PAST SURGICAL HISTORY: noncontributory   PHYSICAL EXAMINATION:  VITAL SIGNS: On presentation to the ward, temperature of 98.7, pulse of 81, respirations 20, blood pressure 104/67 and 93% room air saturation.  LYMPHATIC: Mild swelling of right lateral foot. Skin shows obvious breakdown over the lateral aspect of the fifth metatarsal head. Pulse exam shows pulses are intact by Doppler exam and faintly by physical examination, 1+. His foot has near complete anesthesia.  MUSCULOSKELETAL: Reveals obvious 3 x 5 cm, necrotic, purulent, full-thickness ulcer down to the level of the metatarsal head which is exposed in the wound. He has no sensation with probing of the wound with a  Q-tip, including of the metatarsal head.   RADIOGRAPHS: Reveal soft tissue swelling over the right lateral foot with periosteal elevation of the lateral aspect of the metatarsal head.   CT scan is pending.   LABORATORY EVALUATION: Reveals positive MRSA screen and INR elevated at 3.2. ESR elevated at 40. C-reactive protein elevated at 10.6, with 4.9 being upper limits of normal.   CBC this morning shows hematocrit and hemoglobin of 22.7 and 6.8 with white blood cell count of 6 and 85% neutrophils.   IMPRESSION: Right diabetic foot ulcer with clinical osteomyelitis.   PLAN: He unfortunately cannot have an MRI. Will proceed with CT scan. He will likely need a podiatry or general surgery consultation for consideration of partial amputation versus below-knee amputation. I have explained this to the patient. I will transfer the patient to Dr. HClydell Hakimcare and discuss with him which service would be most appropriate to consult.   Thank you for this consult. I agree with the continued antibiotic coverage and elevation. Sterile dressing was placed after examination today with calcium alginate and Ace wrap.   ____________________________ JMaebelle Munroe MD jfs:gb D: 03/25/2014 03:10:41 ET T: 03/25/2014 04:28:07 ET JOB#: [id number] cc: JMaebelle Munroe MD, <Dictator> JMaebelle MunroeMD ELECTRONICALLY SIGNED 04/30/2014 23:18

## 2015-04-05 NOTE — Op Note (Signed)
PATIENT NAME:  [name], [name] MR#:  [id number] DATE OF BIRTH:  14-Aug-1961  DATE OF PROCEDURE:  04/01/2014  PREOPERATIVE DIAGNOSES: 1.  Peripheral arterial disease with ulceration and infection, right lower extremity.  2.  Diabetes mellitus.   POSTOPERATIVE DIAGNOSES:   1.  Peripheral arterial disease with ulceration and infection, right lower extremity.  2.  Diabetes mellitus.   PROCEDURES PERFORMED: 1.  Ultrasound guidance for vascular access, left femoral artery.  2. Catheter placement to right anterior tibial and right posterior tibial arteries from left femoral approach.  3.  Aortogram and selective right lower extremity angiogram.  4.  Percutaneous transluminal angioplasty of long segment right anterior tibial artery occlusion with 3 mm diameter angioplasty balloon.  5. Percutaneous transluminal angioplasty of right distal popliteal artery and proximal tibioperoneal trunk with 5 mm diameter drug-coated angioplasty balloon.  6.  StarClose closure device, left femoral artery.   SURGEON: Annice Needy, M.D.   ANESTHESIA: Local with moderate conscious sedation.   ESTIMATED BLOOD LOSS: 25 mL.   FLUOROSCOPY TIME:  Approximately 5 minutes.   CONTRAST USED: 70 mL.   INDICATION FOR PROCEDURE: This is a 54 year old white male with nonhealing ulceration of the right foot and osteomyelitis. He has undergone a fifth ray amputation of the right foot within the last few days to address the infection, and he needs his perfusion addressed to improve his wound healing. Risks and benefits were discussed. Informed consent was obtained.   DESCRIPTION OF PROCEDURE: The patient is brought to the vascular suite. Groins were shaved and prepped, and a sterile surgical field was created. Ultrasound was used to visualize a patent left femoral artery. It was accessed under direct ultrasound guidance without difficulty with a Seldinger needle. A J-wire and 5-French sheath were then placed. A pigtail catheter was  placed in the aorta at the L1-L2 level and AP aortogram was performed. This showed normal renal vessels, normal aorta and iliac segments. I then crossed the aortic bifurcation and advanced to the right femoral head and selective right lower extremity angiogram was then performed. This showed a calcified but patent common femoral artery and superficial femoral artery. The popliteal artery tapered down and had about a 75% to 80% stenosis in the below-knee popliteal artery just above the tibial trifurcation. This was advanced for a centimeter or so into the tibioperoneal trunk and then it normalized. The peroneal artery and posterior tibial arteries were patent distally. The anterior tibial artery was patent over the first 8 to 10 cm, but then occluded over a long segment and reconstituted in the distal lower leg. Then, I heparinized the patient with 4000 units of intravenous heparin and placed a 6-French Ansel sheath over a Terumo Advantage wire. I was able to gain access to the anterior tibial artery with a Kumpe catheter and cross the lesion with a CXI catheter and exchange for an 0.018 wire. I confirmed intraluminal flow in the foot of the anterior tibial artery. Over the 0.018 wire, a 3 mm diameter x 30 cm length angioplasty balloon was used to treat the long segment anterior tibial artery occlusion. A waist was taken, which resolved with angioplasty. Completion angiogram following this showed continuous flow into the right foot through the anterior tibial artery with markedly improved flow. I then exchanged through the CXI catheter back for the Advantage wire, went down into the posterior tibial artery. A 5 mm diameter Lutonix drug-coated angioplasty balloon was used to treat the distal popliteal stenosis that did go down  into the proximal tibioperoneal trunk but was predominantly in the popliteal artery. A waist was taken, which resolved with angioplasty. Completion angiogram following this showed a  non-flow-limiting dissection.  The popliteal artery still had brisk flow through it, and the degree of stenosis was markedly improved. At this point, I elected to terminate the procedure. The sheath was pulled back to the ipsilateral external iliac artery and oblique arteriogram was performed. StarClose closure device was deployed in the usual fashion with excellent hemostatic result. The patient tolerated the procedure well and was taken to the recovery room in stable condition.    ____________________________ Annice NeedyJason S. Janye Maynor, MD jsd:dmm D: 04/01/2014 11:58:15 ET T: 04/01/2014 12:15:12 ET JOB#: [id number]  cc: Annice NeedyJason S. Amarachukwu Lakatos, MD, <Dictator> Lynnea FerrierBert J. Klein III, MD Rhona RaiderMatthew G. Troxler, DPM Annice NeedyJASON S Reva Pinkley MD ELECTRONICALLY SIGNED 04/08/2014 9:37

## 2015-04-05 NOTE — Consult Note (Signed)
CHIEF COMPLAINT and HISTORY:  Subjective/Chief Complaint Fever, right foot osteomyelitis, GI bleed   History of Present Illness Patient known to our service previously, now admitted with fever and has a known right foot ulcer with likely osteomyelitis.  Also had elevated Cr on admission with ARF.  Cr down to 1.7 today and was 2.2 on admission.  On Coumadin for a fib and strokes but now has anemia and GI bleed present.  GI has seen and plans endoscopy when INR <2.  Was scheduled to have an angiogram this week as an outpatient to assess his RLE perfusion.   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   Diabetes:    Hypertension:    Cardiac Stents:   ALLERGIES:  Allergies:  Penicillin: Rash  HOME MEDICATIONS:  Home Medications:  Medication Instructions Status  carvedilol 12.5 mg oral tablet 1 tab(s) orally 2 times a day Active  citalopram 20 mg oral tablet 1 tab(s) orally once a day Active  Coumadin 3 mg oral tablet 1 tab(s) orally once a day at HS Active  doxycycline hyclate hyclate 100 mg oral tablet 1 tab(s) orally 2 times a day Active  Dymista 137 mcg-50 mcg/inh nasal spray 1 spray(s) nasal 2 times a day Active  hydrALAZINE 100 mg oral tablet 1 tab(s) orally   Active  isosorbide dinitrate 20 mg oral tablet 1 tab(s) orally 3 times a day Active  lovastatin 40 mg oral tablet 1 tab(s) orally once a day Active  traMADol 50 mg oral tablet 1 tab(s) orally every 6 hours, As Needed - for Pain Active  Tylenol 325 mg oral tablet 1 tab(s) orally every 6 hours, As Needed - for Pain Active  Santyl 250 units/g topical ointment Apply topically to  affected area once a day Active  metolazone 2.5 mg oral tablet 1 tab(s) orally every other day Active  multivitamin 1 tab(s) orally once a day Active  NPH Insulin 100 units/ml 27 unit(s) subcutaneous once a day (in the morning) Active  NPH Insulin 100 units/ml  3 unit(s) subcutaneous once a day (in the evening) Active  oxybutynin 5 mg oral tablet 1 tab(s) orally once a day (at bedtime) Active  oxyCODONE 5 mg oral tablet 1 tab(s) orally every 6 hours, As Needed - for Pain Active  Refresh Plus - ophthalmic solution 2 drop(s) to each affected eye 3 times a day, As Needed Active  cetirizine 10 mg oral tablet 1 tab(s) orally once a day Active  Lasix 40 mg oral tablet 1 tab(s) orally 2 times a day Active  ferrous sulfate 325 mg (65 mg elemental iron) oral delayed release tablet 1 tab(s) orally once a day Active  diazepam 5 mg oral tablet 1 tab(s) orally every 8 hours, As Needed- for Anxiety, Nervousness  Active  Regular Insulin Sliding Scale   0-200=0 units, 201-250= 3 units, 251-300=5 units, 301-350= 8 units, 351-400= 10 units,  > 400 units =12 units.FSBS before meals and at bedtime Active   Family and Social History:  Family History Coronary Artery Disease  Hypertension    Social History negative tobacco, negative ETOH, negative  Illicit drugs   Review of Systems:  Subjective/Chief Complaint foot ulcer right foot.  Pain right foot GI bleed ARF anemia   Fever/Chills Yes    Cough No    Sputum No    Abdominal Pain Yes    Diarrhea No    Constipation No    Nausea/Vomiting Yes    SOB/DOE No    Chest Pain No    Telemetry Reviewed Afib   Dysuria No    Tolerating PT Yes    Tolerating Diet Yes    Physical Exam:  GEN well developed, well nourished, appears older than stated age   HEENT pink conjunctivae, moist oral mucosa   NECK No masses  trachea midline    RESP normal resp effort  no use of accessory muscles    CARD irregular rate  no JVD    VASCULAR ACCESS none     ABD soft  normal BS    LYMPH negative neck, negative axillae   EXTR negative cyanosis/clubbing, positive edema, with stasis changes present on lower legs.  Pulses diminished in right foot   SKIN positive ulcers, right foot, dressed   NEURO decreased sensation in feet, moves all four extremities   PSYCH alert, A+O to time, place, person   LABS:  Laboratory Results:  Hepatic:    11-Apr-15 20:24, Comprehensive Metabolic Panel  Bilirubin, Total 0.9  Alkaline Phosphatase 269  45-117  NOTE: New Reference Range  11/02/13  SGPT (ALT) 23  SGOT (AST) 17  Total Protein, Serum 7.4  Albumin, Serum 3.3  TDMs:    13-Apr-15 04:29, Digoxin, Serum  Digoxin, Serum 0.31  Therapeutic range for digoxin in patients with atrial  fibrillation: 0.8 - 2.0 ng/mL.  In patients with congestive heart failure a therapeutic  range of 0.5 - 0.8 ng/mL is suggested as higher levels are  associated with an increased risk of toxicity without clear  evidence of enhanced efficacy.  Digoxin toxicity is commonly associated with serum levels  > 2.0 ng/mL but may occur with lower levels, including those  in the therapeutic range.  Blood samples should be obtained 6-8 hours after administration  to assure a reasonable volume of distribution.  Routine BB:    12-Apr-15 10:44, Crossmatch 1 Unit  Crossmatch Unit 1   Transfused   Result(s) reported on 25 Mar 2014 at 07:05AM.    12-Apr-15 10:44, Type and Antibody Screen  ABO Group + Rh Type   O Negative  Antibody Screen NEGATIVE  Result(s) reported on 24 Mar 2014 at 01:48PM.    12-Apr-15 14:55, Fresh Frozen Plasma - 2 Units  Fresh Frozen Plasma Unit 1   Transfused  Fresh Frozen Plasma Unit 2   Transfused   Result(s) reported on 25 Mar 2014 at 07:05AM.  Routine Micro:    11-Apr-15 22:12, Blood Culture  Micro Text Report   BLOOD CULTURE    COMMENT                   NO GROWTH IN 18-24 HOURS     ANTIBIOTIC  Culture Comment   NO GROWTH IN 18-24 HOURS    Result(s) reported on 24 Mar 2014 at 10:00PM.  Micro Text Report   BLOOD CULTURE    COMMENT                   NO GROWTH IN 18-24 HOURS     ANTIBIOTIC  Culture Comment   NO GROWTH IN 18-24 HOURS   Result(s) reported on 24 Mar 2014 at 10:00PM.    11-Apr-15 22:39, Wound Aerobic/Anaerobic Culture  Organism Name   GRAM NEGATIVE ROD  Organism Quantity   HEAVY GROWTH  Micro Text Report   WOUND AER/ANAEROBIC CULT    ORGANISM 1                HEAVY GROWTH GRAM NEGATIVE ROD    COMMENT                   ID TO FOLLOW SENSITIVITIES TO FOLLOW    COMMENT                   ONCE BETTER GROWTH    GRAM STAIN                FEW WHITE BLOOD CELLS    GRAM STAIN                MANY GRAM POSITIVE ROD    GRAM STAIN                MODERATE GRAM POSITIVE COCCI IN PAIRS    GRAM STAIN                FEW GRAM NEGATIVE ROD     ANTIBIOTIC  Organism 1   HEAVY GROWTH GRAM NEGATIVE ROD  Culture Comment   ID TO FOLLOW SENSITIVITIES TO FOLLOW  Culture Comment .   ONCE BETTER GROWTH  Gram Stain 1   FEW WHITE BLOOD CELLS  Gram Stain 2   MANY GRAM POSITIVE ROD  Gram Stain 3   MODERATE GRAM POSITIVE COCCI IN PAIRS  Gram Stain 4   FEW GRAM NEGATIVE ROD   Result(s) reported on 24 Mar 2014 at 11:41AM.    12-Apr-15 14:43, MRSA Screen, RT-PCR  Comment 1 .   POSITIVE - MRSA target DNA detected ------------------------------  Test procedure integrates sample purification, nucleic acid  amplification, and detection of the target MRSA sequence in  simple or complex samples using real-time PCR and RT-PCR assays.  This test is not intended to diagnose MRSA nor to guide or  monitor treatment for MRSA infections. Concomitant cultures  are necessary only to recover organisms for epidemiological  typing or for further susceptibility testing.  Micro Text Report   MRSA SCREEN, RT-PCR    COMMENT                   POSITIVE - MRSA target DNA detected     ANTIBIOTIC  General Ref:    12-Apr-15 08:52, C-Reactive Protein   C-Reactive Protein   ========== TEST NAME ==========  ========= RESULTS =========  = REFERENCE RANGE =    C-REACTIVE PROTEIN,QUANT    C-Reactive Protein, Quant  C-Reactive Protein, Quant       [H  10.6 mg/L            ]           0.0-4.9                  [facility]            No: [id number]            [address], [place], [place] [address]            Lindon Romp, MD         [phone]     Result(s) reported on 25 Mar 2014 at 12:48AM.  Routine Chem:    11-Apr-15 20:24, Comprehensive Metabolic  Panel  Glucose, Serum 193  BUN 80  Creatinine (comp) 2.23  Sodium, Serum 137  Potassium, Serum 3.3  Chloride, Serum 101  CO2, Serum 29  Calcium (Total), Serum 9.2  Osmolality (calc) 303  eGFR (African American) 38  eGFR (Non-African American) 32  eGFR values <52m/min/1.73 m2 may be an indication of chronic  kidney disease (CKD).  Calculated eGFR is useful in patients with stable renal function.  The eGFR calculation will not be reliable in acutely ill patients  when serum creatinine is changing rapidly. It is not useful in   patients on dialysis. The eGFR calculation may not be applicable  to patients at the low and high extremes of body sizes, pregnant  women, and vegetarians.  Anion Gap 7    12-Apr-15 14:43, MRSA Screen, RT-PCR  Result Comment   MRSA SCREEN - NOTIFIED OF CRITICAL VALUE   - CALLED TO KRISTI STANTON AT [id number]   - 03/25/2014..Marland KitchenMarland KitchenPL   - READ-BACK PROCESS PERFORMED.   Result(s) reported on 25 Mar 2014 at 12:28AM.    13-Apr-15 019:50 Basic Metabolic Panel (w/Total Calcium)  Glucose, Serum 104  BUN 60  Creatinine (comp) 1.73  Sodium, Serum 141  Potassium, Serum 3.0  Chloride, Serum 106  CO2, Serum 29  Calcium (Total), Serum 8.5  Anion Gap 6  Osmolality (calc) 298  eGFR (African American) 51  eGFR (Non-African American) 44  eGFR values <669mmin/1.73 m2 may be an indication of chronic  kidney disease (CKD).  Calculated eGFR is useful in  patients with stable renal function.  The eGFR calculation will not be reliable in acutely ill patients  when serum creatinine is changing rapidly. It is not useful in   patients on dialysis. The eGFR calculation may not be applicable  to patients at the low and high extremes of body sizes, pregnant  women, and vegetarians.  Routine UA:    11-Apr-15 22:39, Urinalysis  Color (UA) Yellow  Clarity (UA) Clear  Glucose (UA) Negative  Bilirubin (UA) Negative  Ketones (UA) Negative  Specific Gravity (UA) 1.009  Blood (UA) Negative  pH (UA) 5.0  Protein (UA) 30 mg/dL  Nitrite (UA) Negative  Leukocyte Esterase (UA) Negative  Result(s) reported on 23 Mar 2014 at 10:58PM.  RBC (UA) 7 /HPF  WBC (UA) 1 /HPF  Bacteria (UA)   NONE SEEN  Epithelial Cells (UA)   NONE SEEN  Hyaline Cast (UA) 10 /LPF  Result(s) reported on 23 Mar 2014 at 10:58PM.  Routine Sero:    12-Apr-15 14:42, Occult Blood, Feces  Occult Blood, Feces NEGATIVE  Result(s) reported on 24 Mar 2014 at 03:20PM.  Routine Coag:    12-Apr-15 12:46, Prothrombin Time  Prothrombin 31.6  INR 3.2  INR reference interval applies to patients on anticoagulant therapy.  A single INR therapeutic range for coumarins is not optimal for all  indications; however, the suggested range for most indications is  2.0 - 3.0.  Exceptions to the INR Reference Range may include: Prosthetic heart  valves, acute myocardial infarction, prevention of myocardial  infarction, and combinations of aspirin and anticoagulant. The need  for a higher or lower target INR must be assessed individually.  Reference: The Pharmacology and Management of the Vitamin K   antagonists: the seventh ACCP Conference on Antithrombotic and  Thrombolytic Therapy. [id number].1245ept:126 (3suppl): [id number] A HCT value >55% may artifactually increase the PT.  In one study,   the increase was an average of 25%.  Reference:  "Effect on Routine  and Special Coagulation Testing  Values  of Citrate Anticoagulant Adjustment in Patients with High HCT Values."  American Journal of Clinical Pathology 2006;126:400-405.    13-Apr-15 04:29, Prothrombin Time  Prothrombin 25.0  INR 2.3  INR reference interval applies to patients on anticoagulant therapy.  A single INR therapeutic range for coumarins is not optimal for all  indications; however, the suggested range for most indications is  2.0 - 3.0.  Exceptions to the INR Reference Range may include: Prosthetic heart  valves, acute myocardial infarction, prevention of myocardial  infarction, and combinations of aspirin and anticoagulant. The need  for a higher or lower target INR must be assessed individually.  Reference: The Pharmacology and Management of the Vitamin K   antagonists: the seventh ACCP Conference on Antithrombotic and  Thrombolytic Therapy. [id number].[id number] Sept:126 (3suppl): [id number].  A HCT value >55% may artifactually increase the PT.  In one study,   the increase was an average of 25%.  Reference:  "Effect on Routine and Special Coagulation Testing Values  of Citrate Anticoagulant Adjustment in Patients with High HCT Values."  American Journal of Clinical Pathology 2006;126:400-405.  Routine Hem:    11-Apr-15 20:24, CBC Profile  WBC (CBC) 8.3  RBC (CBC) 3.26  Hemoglobin (CBC) 8.1  Hematocrit (CBC) 25.4  Platelet Count (CBC) 239  MCV 78  MCH 25.0  MCHC 32.1  RDW 17.9  Neutrophil % 91.7  Lymphocyte % 2.0  Monocyte % 4.5  Eosinophil % 1.3  Basophil % 0.5  Neutrophil # 7.6  Lymphocyte # 0.2  Monocyte # 0.4  Eosinophil # 0.1  Basophil # 0.0  Result(s) reported on 23 Mar 2014 at 08:52PM.    12-Apr-15 08:52, CBC Profile  WBC (CBC) 6.1  RBC (CBC) 2.90  Hemoglobin (CBC) 6.8  Hematocrit (CBC) 22.7  Platelet Count (CBC) 184  MCV 78  MCH 23.3  MCHC 29.7  RDW 17.9  Neutrophil % 85.4  Lymphocyte % 4.5  Monocyte % 8.3  Eosinophil % 1.4  Basophil % 0.4  Neutrophil # 5.2  Lymphocyte # 0.3   Monocyte # 0.5  Eosinophil # 0.1  Basophil # 0.0  Result(s) reported on 24 Mar 2014 at 09:12AM.    12-Apr-15 08:52, Sedimentation Rate  Erythrocyte Sed Rate 40  Result(s) reported on 24 Mar 2014 at 09:12AM.    12-Apr-15 19:51, Hemoglobin  Hemoglobin (CBC) 8.0  Result(s) reported on 24 Mar 2014 at 08:00PM.    13-Apr-15 04:29, CBC Profile  WBC (CBC) 5.2  RBC (CBC) 3.20  Hemoglobin (CBC) 8.0  Hematocrit (CBC) 25.3  Platelet Count (CBC) 165  MCV 79  MCH 25.0  MCHC 31.6  RDW 17.6  Neutrophil % 73.7  Lymphocyte % 9.2  Monocyte % 12.6  Eosinophil % 3.8  Basophil % 0.7  Neutrophil # 3.8  Lymphocyte # 0.5  Monocyte # 0.7  Eosinophil # 0.2  Basophil # 0.0  Result(s) reported on 25 Mar 2014 at 05:19AM.   RADIOLOGY:  Radiology Results:  XRay:    01-Jun-14 03:24, Chest Portable Single View  Chest Portable Single View  REASON FOR EXAM:    UPRIGHT, N/V  COMMENTS:       PROCEDURE: DXR - DXR PORTABLE CHEST SINGLE VIEW  - May 13 2013  3:24AM     RESULT: Comparison is made to the previous study of 09/02/2012.    The cardiac silhouette is enlarged. There is a single lead left-sided   pacemaker present. Monitoring electrodes are present. The lungs are  clear. The bony and mediastinal structures are unremarkable.    IMPRESSION:   1. Cardiomegaly. This is stable.  2. Pacemaker present.  Dictation Site: 6        VerifiedBy: Sundra Aland, M.D., MD    11-Apr-15 21:34, Foot Right Complete  Foot Right Complete  REASON FOR EXAM:    foot ulcer to lateral aspect of 5th metatarsal  COMMENTS:       PROCEDURE: DXR - DXR FOOT RT COMPLETE W/OBLIQUES  - Mar 23 2014  9:34PM     CLINICAL DATA:  Foot ulcer for greater than 1 month, weeping wound  with greenish yellow pus and visible muscle tissue    EXAM:  RIGHT FOOT COMPLETE - 3+ VIEW    COMPARISON:  None.    FINDINGS:  There is a prominent ulcer laterally over the distal aspect of the  fifth metatarsal and metatarsal  phalangeal joint. The lateral cortex  of the distal fifth metatarsal is not visible. Proximal phalanx  appears intact. There is diffuse osteopenia and small vessel  calcification.     IMPRESSION:  Findings consistent with osteomyelitis associated with deep soft  tissue ulcer.      Electronically Signed    By: Skipper Cliche M.D.    On: 03/23/2014 21:38       Verified By: Rachael Fee, M.D.,  [place]:    01-Jun-14 05:12, US Abdomen Limited Survey  US Abdomen Limited Survey  REASON FOR EXAM:    [place] PAIN, N/V  COMMENTS:   Body Site: GB and Fossa, CBD, Head of Pancreas    PROCEDURE: [place]  - US ABDOMEN LIMITED SURVEY  - May 13 2013  5:12AM     RESULT: Limited right upper quadrant abdominal sonogram is obtained. The   visualized pancreas appears normal. Portal venous flow is normal. Liver   length is 15.97 cm. There is no intrahepatic biliary ductal dilation.   Hepatic echotexture appears to be normal. Common bile duct diameter 3.7   mm. The gallbladder wall thickness is 2.2 mm. There are small mobile   stones demonstrated within the gallbladder. There is trace amount of   fluid adjacent to the hepatic margin and subdiaphragmatic region with a   trace right pleural effusion suggested.    IMPRESSION:  Cholelithiasis. There is a negative sonographic Murphy's     sign. There are no other signs to suggest acute cholecystitis. Trace   ascites. Trace right pleural effusion.    Dictation Site: 6        Verified By: Sundra Aland, M.D., MD  LabUnknown:    01-Jun-14 03:24, Chest Portable Single View  PACS Image    01-Jun-14 05:12, US Abdomen Limited Survey  PACS Image    01-Jun-14 11:03, CT Abdomen and Pelvis With Contrast  PACS Image    11-Apr-15 21:34, Foot Right Complete  PACS Image    12-Apr-15 18:36, CT Foot Right Without Contrast  PACS Image  CT:    01-Jun-14 11:03, CT Abdomen and Pelvis With Contrast  CT Abdomen and Pelvis With Contrast  REASON FOR EXAM:     (1) rlq llq pain vomiting hi whitge blood count; (2)   abd pain  COMMENTS:       PROCEDURE: CT  - CT ABDOMEN / PELVIS  W  - May 13 2013 11:03AM     RESULT: CT of the abdomen and pelvis is performed with 100 mL of   Isovue-300 iodinated intravenous contrast and oral contrast.  Images   reconstructed at 3.0 mm slice thickness in the axial plane. Comparison is   made to previous exam dated 07/17/2010.    Images through the base the lungs demonstrate patchy ground glass   attenuationin both lower lobes greater on the right than the left. This   could be secondary to minimal infiltrate or atelectasis. Correlate for   acute infectious or inflammatory process for developing pneumonia.  Cholelithiasis is demonstrated. The liver and spleen appear unremarkable.   The pancreas appears within normal limits. Tiny stones appear present in   the upper pole the left kidney. Both kidneys shows lobulated contours   without a discrete mass. Atherosclerotic calcification is present within   the abdominal aorta without evidence of an aneurysm. A normal appearing   appendix is present. There is moderately prominent diverticular disease   in the sigmoid colon and distal ascending colon. Diverticula are seen in   the transverse colon and inthe ascending colon and to a lesser extent.   There is no definite CT evidence of acute diverticulitis. There is no   abscess or pneumoperitoneum. No abnormal bowel distention or wall   thickening is evident. There is no adenopathy. The urinary bladder and   prostate appear unremarkable. The heart appears to be mildly enlarged.   The bony structures appear to be unremarkable.    IMPRESSION:   1. Nonobstructing upper pole small left renal stones.  2. Ground glass attenuation in the lower lobe regions worse on the right   than the left. Correlate for underlying mild inflammation or infectious   process.  3. Cholelithiasis.  4. Colonic diverticulosis without definite evidence  of acute   diverticulitis.    Dictation Site: 6        Verified By: Sundra Aland, M.D., MD    12-Apr-15 18:36, CT Foot Right Without Contrast  CT Foot Right Without Contrast  REASON FOR EXAM:    r foot diabetic ulcer with probable 5th metatarsal   osteomyelitis (5th MT head ex  COMMENTS:       PROCEDURE: CT  - CT FOOT RIGHT WITHOUT CONTRAST  - Mar 24 2014  6:36PM     CLINICAL DATA:  Lateral forefoot diabetic skin ulcer. Probable fifth  metatarsal osteomyelitis on radiographs.    EXAM:  CT OF THE RIGHT FOOT WITHOUT CONTRAST    TECHNIQUE:  Multidetector CT imaging was performed according to the standard  protocol. Multiplanar CT image reconstructions were also generated.  COMPARISON:  DG FOOT COMPLETE 3+V*R* dated 03/23/2014    FINDINGS:  As demonstrated radiographically, there is skin and soft tissue  ulceration lateral and dorsal to the fifth metatarsal head. There is  an associated sinus tract containing air versus soft tissue  emphysema, extending nearly to the cortex. The underlying cortex of  the metatarsal head and neck is disrupted dorsally and laterally,  worrisome for osteomyelitis.    The additional metatarsals and toes appear intact. The tibial  sesamoid of the first metatarsal is bipartite. The alignment is  normal at the Lisfranc joint. 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  There is generalized subcutaneous and muscular edema throughout the  ankle and foot. No focal fluid collections are identified. Diffuse  vascular calcifications are noted.     IMPRESSION:  CT confirms the presence of cortical disruption and irregular  erosion of the fifth metatarsal head adjacent to a prominent skin  ulcer, remaining concerning for osteomyelitis.  There is generalized  soft tissue edema throughout the foot and ankle.      Electronically Signed    By: Camie Patience M.D.    On: 03/24/2014  20:40     Verified By: Vivia Ewing, M.D.,   ASSESSMENT AND PLAN:  Assessment/Admission Diagnosis non-healing ulcer RLE with osteomyelitis ARF GI bleed H/O stroke DM a fib   Plan Discussed case with Dr. Caryl Comes. Complex situation. Will need an angiogram to assess and try to optimize RLE perfusion at some point.  Will hold off for now given ARF which is improving with hydration and GI bleed which may limit anticoagulants that we can use.  GI seeing and plans scope when INR down.  If bleeding source found and addressed, and if renal function continues to improve, may be able to do angiogram later this week or next week.  Will follow.   level 4   Electronic Signatures: Algernon Huxley (MD)  (Signed 13-Apr-15 12:06)  Authored: Chief Complaint and History, PAST MEDICAL/SURGICAL HISTORY, ALLERGIES, HOME MEDICATIONS, Family and Social History, Review of Systems, Physical Exam, LABS, RADIOLOGY, Assessment and Plan   Last Updated: 13-Apr-15 12:06 by Algernon Huxley (MD)

## 2015-04-05 NOTE — Consult Note (Signed)
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   Patient was seen by consultant.   Consult note dictated.   Recommend to proceed with surgery or procedure.   Recommend further assessment or treatment.   Orders entered.   Discussed with Attending MD.   Comments: Will wait for Ngioplasty to be done then likely will need 5th ray amputation.  Electronic Signatures: Epimenio Sarinroxler, Cataleya Cristina G (MD)  (Signed 13-Apr-15 12:59)  Authored: Brief Consult Note   Last Updated: 13-Apr-15 12:59 by Epimenio Sarinroxler, Dione Petron G (MD)

## 2015-04-05 NOTE — Consult Note (Signed)
Chief Complaint:  Subjective/Chief Complaint Please see Colonoscopy report.  Mild diverticulosis, one polyp removed. Recommend continue iron, consider o/p capsule endoscopy, GI fu in a couple weeks.   Electronic Signatures: Barnetta ChapelSkulskie, Ausencio Vaden (MD)  (Signed 24-Apr-15 17:56)  Authored: Chief Complaint   Last Updated: 24-Apr-15 17:56 by Barnetta ChapelSkulskie, Cledis Sohn (MD)

## 2015-04-05 NOTE — Consult Note (Signed)
PATIENT NAME:  [name], [name] MR#:  [id number] DATE OF BIRTH:  December 05, 1961  DATE OF CONSULTATION:  03/25/2014  REFERRING PHYSICIAN:   CONSULTING PHYSICIAN:  [name], DPM  HISTORY OF PRESENT ILLNESS: The patient is a 54 year old male admitted to the hospital over the weekend because of fever, chills, malaise, had drainage from a wound on his right foot. He states he has had the wound there for over a month. He was at [facility] for a while. He thinks he got it up against the bedframe there and then he was sent to a group facility and he started getting worse at that point and they sent him to the hospital on Saturday where he was admitted. For some reason an orthopedic consult was written and they apparently did not do the consult, and I was consulted this morning, which is 2 days past his original consult timeframe.   PAST MEDICAL HISTORY: Includes hypertension, coronary artery disease, congestive heart disease, ischemic cardiomyopathy, proximal atrial fibrillation, stroke, and also diabetes.  CURRENT MEDICATIONS: Include spironolactone, aspirin, Norco, lisinopril, digoxin, Coumadin, diazepam, trazodone, Zofran, lovastatin, Coreg, Lasix, Feosol, Prilosec, NPH insulin 23 units in the morning and 16 units in the evening as well as on sliding scale insulin. Currently he is on vancomycin as well as I think a more broad-spectrum antibiotic as well.   PHYSICAL EXAMINATION:  VITAL SIGNS: Include temperature 97.9, pulse 74, respirations 18, blood pressure 114/75, pulse ox 94. LOWER EXTREMITY: Shows ulceration of lateral fifth metatarsal head area. Wound itself is about 2.5 cm x 1.8 cm. There is necrotic tissue present in the area. There is surrounding cellulitis, but it looks like it is improved and stabilized compared to what it was apparently when he was having fevers and chills on Saturday. X-rays and a CT scan from Saturday are indicative of demineralization of bone consistent with  osteomyelitis of the fifth metatarsal head. Pulses are nonpalpable in the area. He has some edema, venostasis, and lymphedema to the right leg as well.   TREATMENT PLAN: He is already scheduled to have an angioplasty done. He is supposed to get it today, but because of some GI bleeding he is scheduled for an EGD today instead, so he will probably have the angioplasty done tomorrow and maybe I can do his foot surgery on Wednesday or Thursday to try to do this fifth ray amputation. I will try to get that established after he has his angioplasty done tomorrow.   ____________________________ Charles RaiderMatthew G. Gaylene Daugherty, DPM mgt:sb D: 03/25/2014 13:05:47 ET T: 03/25/2014 13:27:08 ET JOB#: [id number]  cc: [name], DPM, <Dictator> Charles SarinMATTHEW G Saahas Hidrogo MD ELECTRONICALLY SIGNED 04/09/2014 12:57

## 2015-04-05 NOTE — Consult Note (Signed)
GI Note: down to 2.1 today.  Will make Mr [name] npo after midnight for possible EGD tomorrow.      Electronic Signatures: Dow Adolphein, Grissel Tyrell (MD)  (Signed on 15-Apr-15 18:00)  Authored  Last Updated: 15-Apr-15 18:00 by Dow Adolphein, Magic Mohler (MD)

## 2015-04-05 NOTE — Op Note (Signed)
PATIENT NAME:  [name], Amer M MR#:  [id number] DATE OF BIRTH:  15-Jul-1961  DATE OF PROCEDURE:  03/30/2014  PREOPERATIVE DIAGNOSIS: Osteomyelitis with noted soft tissue damage right 5th  metatarsophalangeal joint region and 5th metatarsal head and distal shaft.   POSTOPERATIVE DIAGNOSIS: Osteomyelitis with noted soft tissue damage right 5th  metatarsophalangeal joint region and 5th metatarsal head and distal shaft.   PROCEDURE: Fifth ray amputation with culture of 5th metatarsal head bone.   SURGEON: Epimenio SarinMatthew G Eulises Kijowski, DPM.   ASSISTANT: None.   HISTORY OF PRESENT ILLNESS: The patient was admitted to the hospital last week. He has  had multiple issues including GI problems as well as inability to get an angioplasty done but I felt like his infection was such and started to drain more from the area so I felt like we needed to go ahead and move doing the 5th ray amputation and he can get the angioplasty done on Monday I think is when he is scheduled for now. I felt we needed to get the infection under better control at this juncture and he was starting to granulate tissue and so I felt he might have reasonable  blood flow to the region until he gets his angioplasty done.   ANESTHESIA: MAC with local anesthesia.   ANESTHESIOLOGIST: Dr. Darleene CleaverVan Staveren.   ESTIMATED BLOOD LOSS: Approximately 50 mL.   OPERATIVE REPORT: The patient was brought to the OR and placed on the OR table in a supine position. At this point after MAC was achieved by the anesthesia team I gave him a block around the 5th metatarsal shaft with 0.5% Marcaine plain. He tolerated this well. At this time, the patient was prepped and draped in the usual sterile manner. Attention was then directed to the lateral 5th metatarsal region where there is an ulceration present. It is about <<2cmMISSING TEXT>> x 3 cm. There are a couple of smaller areas proximal to that where there is some ulcerative changes as well. Swelling extends all along the  lateral side of his foot and into the base of the 5th toe as well. There is penetration down to bone in this wound. At this time, I elected to do an incision that ellipsed around the proximal smaller wounds, which were only a few millimeters in diameter and went through centrally the larger wound and carried around the base of the toe for amputation. Once incision was made, it was carried down to bone. The toe was removed <<MIviaSSING TEXT>> disarticulated at the MTP joint. Fifth metatarsal was then exposed. I used a Ronguer to remove soft demineralized bone at 5th  metatarsal head and sent this for culture. Bone was removed approximately two thirds up the proximal shaft and at a point where good bone was noted. The bone was removed. This was all removed in toto, sent to pathology for evaluation as well as culturing of the bone. A Versajet was then used to remove any devitalized or infected soft tissue. Once this was accomplished, the area was copiously irrigated, checked for any damaged tissue. There was some of the abductor digiti quinti muscle and tendon that I needed to remove because it was involved with infection as well. After more copious irrigation, the distal and proximal portion of the wound was closed and then some gentamicin, Stimulan beads that we had prepared earlier were placed in the wound and then the wound was closed primarily with 3-0 nylon simple interrupted sutures. I say the wound closed probably 85 to 90%  primarily with a couple of small areas where it will have to granulate in but the overall fairly good closure. Bleeding was satisfactory. At this point, I felt he should have good enough bleeding to remain viable and Dr. Wyn Quaker has scheduled an angioplasty on Monday. I will speak to Dr. Wyn Quaker about my findings today. The patient was then dressed in a sterile compressive dressing consisting of Xeroform gauze, 4 x 4's, ABD pads, Conform and Kerlix. He tolerated the procedure and anesthesia well,  left the OR for the recovery room, vital signs stable, neurovascular status intact.   ____________________________ Rhona Raider. Latorria Zeoli, DPM mgt:ja D: 03/30/2014 09:31:00 ET T: 03/30/2014 20:40:29 ET JOB#: [id number]  cc: Rhona Raider Deidrick Rainey, DPM, <Dictator> Epimenio Sarin MD ELECTRONICALLY SIGNED 04/09/2014 12:58

## 2015-04-05 NOTE — Consult Note (Signed)
Chief Complaint:  Subjective/Chief Complaint seen for anemia, reported black stools.  repeat hemoccult today noted positive;  still on regular diet.  denies abdominalpain or nausea.   VITAL SIGNS/ANCILLARY NOTES: **Vital Signs.:   22-Apr-15 19:31  Vital Signs Type Routine  Temperature Temperature (F) 97.7  Celsius 36.5  Temperature Source oral  Pulse Pulse 78  Respirations Respirations 20  Systolic BP Systolic BP 935  Diastolic BP (mmHg) Diastolic BP (mmHg) 83  Mean BP 98  Pulse Ox % Pulse Ox % 99  Pulse Ox Activity Level  At rest  Oxygen Delivery Room Air/ 21 %   Brief Assessment:  Cardiac Irregular   Respiratory clear BS   Gastrointestinal details normal Soft  Nontender  Nondistended  No masses palpable  Bowel sounds normal   Lab Results: Routine Chem:  22-Apr-15 04:51   Glucose, Serum 77  BUN  41  Creatinine (comp)  1.57  Sodium, Serum 139  Potassium, Serum 4.6  Chloride, Serum  113  CO2, Serum 22  Calcium (Total), Serum  8.3  Anion Gap  4  Osmolality (calc) 286  eGFR (African American)  57  eGFR (Non-African American)  50 (eGFR values <36m/min/1.73 m2 may be an indication of chronic kidney disease (CKD). Calculated eGFR is useful in patients with stable renal function. The eGFR calculation will not be reliable in acutely ill patients when serum creatinine is changing rapidly. It is not useful in  patients on dialysis. The eGFR calculation may not be applicable to patients at the low and high extremes of body sizes, pregnant women, and vegetarians.)  Routine Sero:  22-Apr-15 15:00   Occult Blood, Feces POSITIVE (Result(s) reported on 03 Apr 2014 at 03:34PM.)  Routine Hem:  22-Apr-15 04:51   WBC (CBC) 6.7  RBC (CBC)  3.13  Hemoglobin (CBC)  7.8  Hematocrit (CBC)  25.2  Platelet Count (CBC) 190  MCV 81  MCH  25.0  MCHC  31.0  RDW  18.0  Neutrophil % 74.8  Lymphocyte % 9.0  Monocyte % 12.8  Eosinophil % 2.4  Basophil % 1.0  Neutrophil # 5.0   Lymphocyte #  0.6  Monocyte # 0.9  Eosinophil # 0.2  Basophil # 0.1 (Result(s) reported on 03 Apr 2014 at 05:55AM.)   Assessment/Plan:  Assessment/Plan:  Assessment 1) anemia, heme positive stool on repeat testing today.  EGD negative.  2) s/p toe amputation for  osteomyelitis.  3) h/o a fib on coumadin, dm, ischemic cardiomyopathy, CAD   Plan 1) diet changed to clears, will plan colonoscopy for friday.  I have discussed the risks benefits and complications of colonoscopy to include not limited to bleeding infection perforation and sedation and he wishes to proceed./   Electronic Signatures: SLoistine Simas(MD)  (Signed 22-Apr-15 20:34)  Authored: Chief Complaint, VITAL SIGNS/ANCILLARY NOTES, Brief Assessment, Lab Results, Assessment/Plan   Last Updated: 22-Apr-15 20:34 by SLoistine Simas(MD)

## 2015-04-05 NOTE — Consult Note (Signed)
Chief Complaint:  Subjective/Chief Complaint seen for anemia and heme positive stool .  doing well, now on clears for colonoscopy tomorrow. denies n/v or abdominal pain.   VITAL SIGNS/ANCILLARY NOTES: **Vital Signs.:   23-Apr-15 03:39  Vital Signs Type Routine  Temperature Temperature (F) 97.4  Celsius 36.3  Temperature Source oral  Pulse Pulse 78  Respirations Respirations 20  Systolic BP Systolic BP 133  Diastolic BP (mmHg) Diastolic BP (mmHg) 84  Mean BP 100  Pulse Ox % Pulse Ox % 97  Pulse Ox Activity Level  At rest  Oxygen Delivery Room Air/ 21 %   Brief Assessment:  Cardiac Irregular   Respiratory clear BS   Gastrointestinal details normal Soft  Nontender  Nondistended  No masses palpable  Bowel sounds normal   Lab Results: Routine Hem:  11-Apr-15 20:24   Hemoglobin (CBC)  8.1  12-Apr-15 08:52   Hemoglobin (CBC)  6.8    19:51   Hemoglobin (CBC)  8.0 (Result(s) reported on 24 Mar 2014 at 08:00PM.)  13-Apr-15 04:29   Hemoglobin (CBC)  8.0  14-Apr-15 06:11   Hemoglobin (CBC)  8.0  16-Apr-15 05:53   Hemoglobin (CBC)  8.6  17-Apr-15 06:08   Hemoglobin (CBC)  8.1  18-Apr-15 05:57   Hemoglobin (CBC)  7.8  19-Apr-15 04:41   Hemoglobin (CBC)  7.3  20-Apr-15 04:43   Hemoglobin (CBC)  7.0  21-Apr-15 03:56   Hemoglobin (CBC)  8.1  22-Apr-15 04:51   Hemoglobin (CBC)  7.8  23-Apr-15 04:35   WBC (CBC) 6.7  RBC (CBC)  3.09  Hemoglobin (CBC)  7.9  Hematocrit (CBC)  24.6  Platelet Count (CBC) 185  MCV 80  MCH  25.7  MCHC 32.2  RDW  18.1  Neutrophil % 75.8  Lymphocyte % 9.3  Monocyte % 11.1  Eosinophil % 2.4  Basophil % 1.4  Neutrophil # 5.1  Lymphocyte #  0.6  Monocyte # 0.7  Eosinophil # 0.2  Basophil # 0.1 (Result(s) reported on 04 Apr 2014 at 07:55AM.)  Erythrocyte Sed Rate  36 (Result(s) reported on 04 Apr 2014 at 05:54AM.)   Assessment/Plan:  Assessment/Plan:  Assessment 1) anemia, heme positive stool- continues with stable though low hgb,  repeat hemoccult positive. EGD last week negative.2) osteomyelitis, s/p amputation of toe.  3) multiple medical issues with h/o a fib on coumadin, dm, ischemic cardiomyopathy, CAD   Plan 1) colonoscopy tomorrow, I have discussed the risks benefits and complicatiosn of colonoscopy to include not limited to bleeding infection perforation and sedation and he wishes to proceed. will likely need to have capsule endoscopy as outpatient.   Electronic Signatures: Barnetta ChapelSkulskie, Wyoma Genson (MD)  (Signed 23-Apr-15 09:57)  Authored: Chief Complaint, VITAL SIGNS/ANCILLARY NOTES, Brief Assessment, Lab Results, Assessment/Plan   Last Updated: 23-Apr-15 09:57 by Barnetta ChapelSkulskie, Audi Conover (MD)

## 2015-04-05 NOTE — Consult Note (Signed)
Chief Complaint:  Subjective/Chief Complaint seen for anemia and reported black stools.  denies n/v or abdominal apin, tolerating regular diet.   VITAL SIGNS/ANCILLARY NOTES: **Vital Signs.:   19-Apr-15 14:01  Vital Signs Type Routine  Temperature Temperature (F) 97.5  Temperature Source oral  Pulse Pulse 70  Respirations Respirations 20  Systolic BP Systolic BP 820  Diastolic BP (mmHg) Diastolic BP (mmHg) 78  Mean BP 91  Pulse Ox % Pulse Ox % 98  Pulse Ox Activity Level  At rest  Oxygen Delivery Room Air/ 21 %   Brief Assessment:  Cardiac Irregular   Respiratory clear BS   Gastrointestinal details normal Soft  Nontender  Nondistended  No masses palpable   Lab Results: Routine Chem:  19-Apr-15 04:41   Glucose, Serum 74  BUN  38  Creatinine (comp)  1.52  Sodium, Serum 140  Potassium, Serum 4.3  Chloride, Serum  114  CO2, Serum 21  Calcium (Total), Serum  7.9  Anion Gap  5  Osmolality (calc) 287  eGFR (African American)  60  eGFR (Non-African American)  52 (eGFR values <58m/min/1.73 m2 may be an indication of chronic kidney disease (CKD). Calculated eGFR is useful in patients with stable renal function. The eGFR calculation will not be reliable in acutely ill patients when serum creatinine is changing rapidly. It is not useful in  patients on dialysis. The eGFR calculation may not be applicable to patients at the low and high extremes of body sizes, pregnant women, and vegetarians.)  Routine Sero:  12-Apr-15 14:42   Occult Blood, Feces NEGATIVE (Result(s) reported on 24 Mar 2014 at 03:20PM.)  15-Apr-15 14:31   Occult Blood, Feces NEGATIVE (Result(s) reported on 27 Mar 2014 at 05:20PM.)  Routine Hem:  19-Apr-15 04:41   WBC (CBC) 7.2  RBC (CBC)  2.96  Hemoglobin (CBC)  7.3  Hematocrit (CBC)  23.9  Platelet Count (CBC) 178  MCV 81  MCH  24.6  MCHC  30.5  RDW  18.3  Neutrophil % 75.8  Lymphocyte % 9.2  Monocyte % 11.0  Eosinophil % 3.1  Basophil % 0.9   Neutrophil # 5.5  Lymphocyte #  0.7  Monocyte # 0.8  Eosinophil # 0.2  Basophil # 0.1 (Result(s) reported on 31 Mar 2014 at 05:44AM.)   Assessment/Plan:  Assessment/Plan:  Assessment 1) anemia, reported black stools.  patietn has denied black stools to me and has been heme negative twice on this admission.  EGD was negative.  I agree with further evaluation via colonoscopy and VCE, however patient is scheduled for vascular proceedure tomorrow, toe amputation yesterday due to osteomyelitis.  He will either have to remain off anticoagulation until proceedures can be done thereafter or in the near future when anticoagulation can be interrupted again.   Plan as above, following.   Electronic Signatures: SLoistine Simas(MD)  (Signed 19-Apr-15 19:53)  Authored: Chief Complaint, VITAL SIGNS/ANCILLARY NOTES, Brief Assessment, Lab Results, Assessment/Plan   Last Updated: 19-Apr-15 19:53 by SLoistine Simas(MD)

## 2015-04-06 NOTE — Consult Note (Signed)
Chief Complaint:   Subjective/Chief Complaint He denies cp . He states left sided weakness.   VITAL SIGNS/ANCILLARY NOTES: **Vital Signs.:   16-Apr-13 08:06   Vital Signs Type POCT   Temperature Temperature (F) 98   Celsius 36.6   Temperature Source tympanic   Pulse Pulse 370   Systolic BP Systolic BP 488   Diastolic BP (mmHg) Diastolic BP (mmHg) 98   Mean BP 110   Pulse Ox % Pulse Ox % 98   Pulse Ox Activity Level  At rest   Oxygen Delivery 2L   Nurse Fingerstick (mg/dL) FSBS (fasting range 65-99 mg/dL) 158   Comments/Interventions  Nurse Notified  *Intake and Output.:   Daily 16-Apr-13 07:00   Grand Totals Intake:  1673 Output:  750    Net:  923 24 Hr.:  923   Oral Intake      In:  473   IV (Primary)      In:  1200   Urine ml     Out:  750   Length of Stay Totals Intake:  1673 Output:  1750    Net:  -77   Brief Assessment:   Cardiac Regular  murmur present  -- JVD  --Gallop    Respiratory normal resp effort  clear BS    Gastrointestinal Normal    Gastrointestinal details normal Soft  Nontender  Nondistended  No masses palpable  Bowel sounds normal   Routine Hem:  16-Apr-13 04:28    WBC (CBC) 13.9   RBC (CBC) 4.77   Hemoglobin (CBC) 12.0   Hematocrit (CBC) 37.9   Platelet Count (CBC) 420   MCV 80   MCH 25.1   MCHC 31.6   RDW 16.3  Routine Chem:  16-Apr-13 04:28    Glucose, Serum 132   BUN 40   Creatinine (comp) 1.40   Sodium, Serum 139   Potassium, Serum 4.1   Chloride, Serum 104   CO2, Serum 24   Calcium (Total), Serum 8.7   Osmolality (calc) 289   eGFR (African American) >60   eGFR (Non-African American) 58   Anion Gap 11  Routine Hem:  16-Apr-13 04:28    Neutrophil % 84.3   Lymphocyte % 7.5   Monocyte % 7.9   Eosinophil % 0.1   Basophil % 0.2   Neutrophil # 11.7   Lymphocyte # 1.0   Monocyte # 1.1   Eosinophil # 0.0   Basophil # 0.0  Thyroid:  16-Apr-13 04:28    Thyroid Stimulating Hormone 1.40  Routine Chem:  16-Apr-13 04:28     B-Type Natriuretic Peptide [facility]) [id number]  Blood Glucose:  16-Apr-13 08:07    POCT Blood Glucose 158    11:57    POCT Blood Glucose 189    17:27    POCT Blood Glucose 168    20:28    POCT Blood Glucose 156   Radiology Results: XRay:    14-Apr-13 13:14, Chest 1 View AP or PA   Chest 1 View AP or PA    REASON FOR EXAM:    STROKE  COMMENTS:       PROCEDURE: DXR - DXR CHEST 1 VIEWAP OR PA  - Mar 26 2012  1:14PM     RESULT:     There is marked cardiomegaly. The patient has taken a shallow   inspiration. With technique taken into consideration there isno evidence   of focal infiltrates, effusions or edema. There is mild prominence of the   interstitial markings.  The visualized bony skeleton is unremarkable.    IMPRESSION:     1. Cardiomegaly.  2. Interstitial prominence which may represent a component of pulmonary   vascular congestion.    Thank you for the opportunity to contribute to the care of your patient.           Verified By: Mikki Santee, M.D., MD    16-Apr-13 08:26, Chest Portable Single View   Chest Portable Single View    REASON FOR EXAM:    dyspnea  COMMENTS:       PROCEDURE: DXR - DXR PORTABLE CHEST SINGLE VIEW  - Mar 28 2012  8:26AM     RESULT: Comparison: None    Findings:     Single portable AP chest radiograph is provided.  There is no focal   parenchymal opacity, pleural effusion, or pneumothorax. The heart size is   mildly enlarged. The osseous structures are unremarkable.    IMPRESSION:     No acute disease of the chest.    Dictation Site: 1          Verified By: Jennette Banker, M.D., MD  [place]:    14-Apr-13 15:07, US Carotid Doppler Bilateral   US Carotid Doppler Bilateral    REASON FOR EXAM:    cva  COMMENTS:       PROCEDURE: [place]  - US CAROTID DOPPLER BILATERAL  - Mar 26 2012  3:07PM     RESULT:     Technique: Grayscale, Duplex color Doppler, SPECTRAL waveform imaging was   performed of the right and left carotid  systems.    Findings: Visual evaluation of the right and left carotid system   demonstrates mild soft plaque within the internal carotid artery, carotid   bulb and common carotid arteries. Arteries on the right and left   demonstrate less than 50% visual stenosis. Color filling is identified   within the vessels as well as appropriate waveforms. ICA/CCA ratios:   Right: 0.95  Left 0.99    Antegrade flow is identified within the right and left vertebral arteries.    IMPRESSION:  No sonographic evidence of hemodynamically significant   stenosis within the right or left carotid systems.       Thank you for the opportunity to contribute to the care of your patient.           Verified By: Mikki Santee, M.D., MD  MRI:    37-Apr-13 09:27, MRI Brain Without Contrast   MRI Brain Without Contrast    REASON FOR EXAM:    cva  COMMENTS:       PROCEDURE: MR  - MR BRAIN WO CONTRAST  - Mar 27 2012  9:27AM     RESULT:     HISTORY: CVA.    COMPARISON STUDIES:  Head CT of 03/26/2012.     PROCEDURE AND FINDINGS:  Multiplanar, multisequence imaging of the brain   is obtained. Diffusion weighted images reveal acute stroke in the right   thalamus and throughout the right medial temporo-occipital region.   Prominent motion artifact is present. No mass lesion noted. Vascular     flow-voids are normal.    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      Thank you for this opportunity to contribute to the care of your patient.           Verified By: Osa Craver, M.D., MD  Cardiology:  14-Apr-13 11:54, ECG   Ventricular Rate 123   Atrial Rate 129   QRS Duration 86   QT 366   QTc 523   R Axis 114   T Axis -13   ECG interpretation    Accelerated Junctional rhythm  Right axis deviation  Pulmonary disease pattern  Septal infarct (cited on or before 06-Aug-2008)  Abnormal ECG  When compared with ECG of  17-Jul-2010 17:39,  Junctional rhythm has replaced Sinus rhythm  QRS axis Shiftedright  Nonspecific T wave abnormality now evident in Inferior leads  Nonspecific T wave abnormality no longer evident in Lateral leads  ----------unconfirmed----------  Confirmed by OVERREAD, NOT ([id number]), editor PEARSON, BARBARA ([id number]) on 03/27/2012 10:06:13 AM   ECG     15-Apr-13 08:12, Echo Doppler   Echo Doppler    Interpretation Summary    The left ventricle is moderately dilated. There is no thrombus. Left   ventricular systolic function is severely reduced. Ejection Fraction   = <25%. There is normal left ventricular wall thickness. There is   severe global hypokinesis of the left ventricle. The right   ventricular systolic function is moderate to severely reduced. The   right ventricle is moderately dilated.    Procedure:    A two-dimensional transthoracic echocardiogram with color flow and   Doppler was performed.    Left Ventricle    Left ventricular systolic function is severely reduced.    Ejection Fraction = <25%.    There is severe global hypokinesis of the left ventricle.    The left ventricle is moderately dilated.    There is no thrombus.    There is normal left ventricular wall thickness.    Right Ventricle    The right ventricle is moderately dilated.    There is normal right ventricular wall thickness.    The right ventricular systolic function is moderate to severely   reduced.    Atria    The left atrium is moderately dilated.    Borderline right atrial enlargement.    Mitral Valve    The mitral valve leaflets appear thickened, but open well.    There is trace mitral regurgitation.    Tricuspid Valve    The tricuspid valve isnot well visualized, but is grossly normal.    There is mild tricuspid regurgitation.    Aortic Valve    The aortic valve is normal in structure and function.    No aortic regurgitation is present.    Pulmonic Valve    The pulmonic valve is not  well seen, but is grossly normal.    There is no pulmonic valvular regurgitation.    Great Vessels    The aortic root is not well visualized but is probably normal size.    Pericardium/Pleural    There is no pleural effusion.    Trivial pericardial effusion.    MMode 2D Measurements and Calculations    RVDd: 4.2 cm    IVSd: 1.2 cm    LVIDd: 5.4 cm    LVIDs: 5.1 cm    LVPWd: 1.4 cm    FS: 5.2 %    EF(Teich): 12 %    Ao root diam: 3.2 cm    LA dimension: 4.8 cm    LVOT diam: 2.0 cm    Doppler Measurements and Calculations    MV E point: 93 cm/sec    MV dec time: 0.15 sec    Ao V2 max: 64 cm/sec    Ao max PG: 2.0  mmHg    AVA(V,D): 1.8 cm2    LV max PG: 1.0 mmHg    LV V1 max: 36 cm/sec    PA V2 max: 54 cm/sec    PA max PG: 1.0 mmHg    PI end-d vel: 179cm/sec    TR Max vel: 243 cm/sec    TR Max PG: 24 mmHg    RVSP: 29 mmHg    RAP systole: 5.0 mmHg    Reading Physician: Lujean Amel   Sonographer: Sherrie Sport  Interpreting Physician:  Lujean Amel,  electronically signed on   03-28-2012 15:34:06  Requesting Physician: Lujean Amel  CT:    14-Apr-13 12:26, CT Head Without Contrast   CT Head Without Contrast    REASON FOR EXAM:    LEFT SIDE WEAKNESS  COMMENTS:       PROCEDURE: CT  - CT HEAD WITHOUT CONTRAST  - Mar 26 2012 12:26PM     RESULT: Head CT dated 03/26/2012.    Technique: Helical noncontrasted 5 mm sections were obtained from the   skull base to thevertex.    Findings: A geographically appearing area of low-attenuation is   appreciated which primarily involves the right occipital lobe. This   finding is well demarcated and has the appearance of a region of subacute   to chronic infarction with the right posterior cerebral artery   distribution. There is no evidence of intra-axial nor extra-axial fluid     collections nor evidence of acute hemorrhage. There are diffuse areas of   low-attenuation within the subcortical, deep, and periventricular  white   matter regions. Diffuse low-attenuation is appreciated within the right   lobe of the thalamus, and in the posterior aspect of the corpus callosum   on the right. Reflecting callosal and thalamic perforator of involvement.    There is evidence of approximately 3 to 4 mm of subfalcine herniation to   the right at the level of the thalamus.    There is partial effacement of the right lateral ventricle. There is no   evidence of a depressed skull fracture. The visualized paranasal sinuses  and mastoid air cells are patent.    IMPRESSION:  Findings consistent with an area of subacute to chronic   infarction within a right PCA distribution.  2. Small vessel white matter ischemic changes are also appreciated.  3. A mass within the area of low-attenuation cannot be excluded though   was of lower differential consideration. These findings appear to   primarily represent cytotoxic edema. Does not appear to be significant   vasogenic edema other than the findings involving the thalamus.          Verified By: Mikki Santee, M.D., MD   Assessment/Plan:  Invasive Device Daily Assessment of Necessity:   Does the patient currently have any of the following indwelling devices? foley    Indwelling Urinary Catheter no longer required, order entered to discontinue   Assessment/Plan:   Assessment IMP NqMI cva htn sob cm DM CRI SMOKING    Plan PLAN Continue tele continue Lovenox Consider coumadin ASA D/C Plavix if coumadin is started Continue DM control agree with Bp med pt/OT Refer to rehab SNF Advise to quit smoking   Electronic Signatures: Lujean Amel D (MD)  (Signed 17-Apr-13 22:02)  Authored: Chief Complaint, VITAL SIGNS/ANCILLARY NOTES, Brief Assessment, Lab Results, Radiology Results, Assessment/Plan   Last Updated: 17-Apr-13 22:02 by Yolonda Kida (MD)

## 2015-04-06 NOTE — Consult Note (Signed)
Chief Complaint:   Subjective/Chief Complaint Pt still lethagic but arousable. He denies pain Still has left sided neglect.   VITAL SIGNS/ANCILLARY NOTES: **Vital Signs.:   17-Apr-13 07:45   Vital Signs Type Routine   Temperature Temperature (F) 98.6   Celsius 37   Temperature Source oral   Pulse Pulse 109   Pulse source per Dinamap   Respirations Respirations 18   Systolic BP Systolic BP 245   Diastolic BP (mmHg) Diastolic BP (mmHg) 85   Mean BP 97   BP Source Dinamap   Pulse Ox % Pulse Ox % 97   Pulse Ox Activity Level  At rest   Oxygen Delivery 2L  *Intake and Output.:   Daily 17-Apr-13 07:00   Grand Totals Intake:   Output:  1250    Net:  -1250 24 Hr.:  -1250   Urine ml     Out:  1250   Length of Stay Totals Intake:  1673 Output:  3000    Net:  -8099   Brief Assessment:   Cardiac Irregular  -- carotid bruits  -- LE edema  -- JVD  --Gallop    Respiratory normal resp effort  clear BS  rhonchi    Gastrointestinal Normal    Gastrointestinal details normal Soft  Nontender  Nondistended  No masses palpable   Cardiology:  17-Apr-13 04:22    Ventricular Rate 112   Atrial Rate 326   QRS Duration 88   QT 356   QTc 485   R Axis 111   T Axis -58  Routine Hem:  17-Apr-13 04:26    WBC (CBC) 7.5   RBC (CBC) 4.81   Hemoglobin (CBC) 12.0   Hematocrit (CBC) 38.3   Platelet Count (CBC) 321   MCV 80   MCH 25.0   MCHC 31.4   RDW 16.2  Routine Chem:  17-Apr-13 04:26    Glucose, Serum 98   BUN 45   Creatinine (comp) 1.70   Sodium, Serum 137   Potassium, Serum 3.7   Chloride, Serum 102   CO2, Serum 23   Calcium (Total), Serum 8.2   Osmolality (calc) 285   eGFR (African American) 53   eGFR (Non-African American) 46   Anion Gap 12  Routine Hem:  17-Apr-13 04:26    Neutrophil % 84.8   Lymphocyte % 9.1   Monocyte % 5.9   Eosinophil % 0.1   Basophil % 0.1   Neutrophil # 6.4   Lymphocyte # 0.7   Monocyte # 0.4   Eosinophil # 0.0   Basophil # 0.0  Routine  Chem:  17-Apr-13 04:26    Cholesterol, Serum 139   Triglycerides, Serum 102   HDL (INHOUSE) 43   VLDL Cholesterol Calculated 20   LDL Cholesterol Calculated 76  Blood Glucose:  17-Apr-13 07:53    POCT Blood Glucose 96    11:19    POCT Blood Glucose 149  Routine Coag:  17-Apr-13 15:32    Prothrombin 15.8   INR 1.2  Blood Glucose:  17-Apr-13 17:47    POCT Blood Glucose 181   Radiology Results: XRay:    14-Apr-13 13:14, Chest 1 View AP or PA   Chest 1 View AP or PA    REASON FOR EXAM:    STROKE  COMMENTS:       PROCEDURE: DXR - DXR CHEST 1 VIEWAP OR PA  - Mar 26 2012  1:14PM     RESULT:     There is marked cardiomegaly.  The patient has taken a shallow   inspiration. With technique taken into consideration there isno evidence   of focal infiltrates, effusions or edema. There is mild prominence of the   interstitial markings. The visualized bony skeleton is unremarkable.    IMPRESSION:     1. Cardiomegaly.  2. Interstitial prominence which may represent a component of pulmonary   vascular congestion.    Thank you for the opportunity to contribute to the care of your patient.           Verified By: Mikki Santee, M.D., MD    16-Apr-13 08:26, Chest Portable Single View   Chest Portable Single View    REASON FOR EXAM:    dyspnea  COMMENTS:       PROCEDURE: DXR - DXR PORTABLE CHEST SINGLE VIEW  - Mar 28 2012  8:26AM     RESULT: Comparison: None    Findings:     Single portable AP chest radiograph is provided.  There is no focal   parenchymal opacity, pleural effusion, or pneumothorax. The heart size is   mildly enlarged. The osseous structures are unremarkable.    IMPRESSION:     No acute disease of the chest.    Dictation Site: 1          Verified By: Jennette Banker, M.D., MD  [place]:    14-Apr-13 15:07, US Carotid Doppler Bilateral   US Carotid Doppler Bilateral    REASON FOR EXAM:    cva  COMMENTS:       PROCEDURE: [place]  - US CAROTID DOPPLER  BILATERAL  - Mar 26 2012  3:07PM     RESULT:     Technique: Grayscale, Duplex color Doppler, SPECTRAL waveform imaging was   performed of the right and left carotid systems.    Findings: Visual evaluation of the right and left carotid system   demonstrates mild soft plaque within the internal carotid artery, carotid   bulb and common carotid arteries. Arteries on the right and left   demonstrate less than 50% visual stenosis. Color filling is identified   within the vessels as well as appropriate waveforms. ICA/CCA ratios:   Right: 0.95  Left 0.99    Antegrade flow is identified within the right and left vertebral arteries.    IMPRESSION:  No sonographic evidence of hemodynamically significant   stenosis within the right or left carotid systems.       Thank you for the opportunity to contribute to the care of your patient.           Verified By: Mikki Santee, M.D., MD  MRI:    69-Apr-13 09:27, MRI Brain Without Contrast   MRI Brain Without Contrast    REASON FOR EXAM:    cva  COMMENTS:       PROCEDURE: MR  - MR BRAIN WO CONTRAST  - Mar 27 2012  9:27AM     RESULT:     HISTORY: CVA.    COMPARISON STUDIES:  Head CT of 03/26/2012.     PROCEDURE AND FINDINGS:  Multiplanar, multisequence imaging of the brain   is obtained. Diffusion weighted images reveal acute stroke in the right   thalamus and throughout the right medial temporo-occipital region.   Prominent motion artifact is present. No mass lesion noted. Vascular     flow-voids are normal.    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  Thank you for this opportunity to contribute to the care of your patient.           Verified By: Osa Craver, M.D., MD  Cardiology:    14-Apr-13 11:54, ECG   Ventricular Rate 123   Atrial Rate 129   QRS Duration 86   QT 366   QTc 523   R Axis 114   T Axis -13   ECG interpretation     Accelerated Junctional rhythm  Right axis deviation  Pulmonary disease pattern  Septal infarct (cited on or before 06-Aug-2008)  Abnormal ECG  When compared with ECG of 17-Jul-2010 17:39,  Junctional rhythm has replaced Sinus rhythm  QRS axis Shiftedright  Nonspecific T wave abnormality now evident in Inferior leads  Nonspecific T wave abnormality no longer evident in Lateral leads  ----------unconfirmed----------  Confirmed by OVERREAD, NOT ([id number]), editor PEARSON, BARBARA ([id number]) on 03/27/2012 10:06:13 AM   ECG     15-Apr-13 08:12, Echo Doppler   Echo Doppler    Interpretation Summary    The left ventricle is moderately dilated. There is no thrombus. Left   ventricular systolic function is severely reduced. Ejection Fraction   = <25%. There is normal left ventricular wall thickness. There is   severe global hypokinesis of the left ventricle. The right   ventricular systolic function is moderate to severely reduced. The   right ventricle is moderately dilated.    Procedure:    A two-dimensional transthoracic echocardiogram with color flow and   Doppler was performed.    Left Ventricle    Left ventricular systolic function is severely reduced.    Ejection Fraction = <25%.    There is severe global hypokinesis of the left ventricle.    The left ventricle is moderately dilated.    There is no thrombus.    There is normal left ventricular wall thickness.    Right Ventricle    The right ventricle is moderately dilated.    There is normal right ventricular wall thickness.    The right ventricular systolic function is moderate to severely   reduced.    Atria    The left atrium is moderately dilated.    Borderline right atrial enlargement.    Mitral Valve    The mitral valve leaflets appear thickened, but open well.    There is trace mitral regurgitation.    Tricuspid Valve    The tricuspid valve isnot well visualized, but is grossly normal.    There is mild tricuspid  regurgitation.    Aortic Valve    The aortic valve is normal in structure and function.    No aortic regurgitation is present.    Pulmonic Valve    The pulmonic valve is not well seen, but is grossly normal.    There is no pulmonic valvular regurgitation.    Great Vessels    The aortic root is not well visualized but is probably normal size.    Pericardium/Pleural    There is no pleural effusion.    Trivial pericardial effusion.    MMode 2D Measurements and Calculations    RVDd: 4.2 cm    IVSd: 1.2 cm    LVIDd: 5.4 cm    LVIDs: 5.1 cm    LVPWd: 1.4 cm    FS: 5.2 %    EF(Teich): 12 %    Ao root diam: 3.2 cm    LA dimension: 4.8 cm    LVOT diam: 2.0 cm  Doppler Measurements and Calculations    MV E point: 93 cm/sec    MV dec time: 0.15 sec    Ao V2 max: 64 cm/sec    Ao max PG: 2.0 mmHg    AVA(V,D): 1.8 cm2    LV max PG: 1.0 mmHg    LV V1 max: 36 cm/sec    PA V2 max: 54 cm/sec    PA max PG: 1.0 mmHg    PI end-d vel: 179cm/sec    TR Max vel: 243 cm/sec    TR Max PG: 24 mmHg    RVSP: 29 mmHg    RAP systole: 5.0 mmHg    Reading Physician: Lujean Amel   Sonographer: Sherrie Sport  Interpreting Physician:  Lujean Amel,  electronically signed on   03-28-2012 15:34:06  Requesting Physician: Lujean Amel    17-Apr-13 04:22, ECG   Ventricular Rate 112   Atrial Rate 326   QRS Duration 88   QT 356   QTc 485   R Axis 111   T Axis -58   ECG interpretation    Atrial fibrillation with rapid ventricular response  Right axis deviation  Pulmonary disease pattern  Septal infarct , age undetermined  Abnormal ECG  No previous ECGs available  Confirmed by Martha Soltys ([id number]) on 03/29/2012 8:57:02 AM    Overreader: Lujean Amel   ECG   CT:    14-Apr-13 12:26, CT Head Without Contrast   CT Head Without Contrast    REASON FOR EXAM:    LEFT SIDE WEAKNESS  COMMENTS:       PROCEDURE: CT  - CT HEAD WITHOUT CONTRAST  - Mar 26 2012 12:26PM     RESULT:  Head CT dated 03/26/2012.    Technique: Helical noncontrasted 5 mm sections were obtained from the   skull base to thevertex.    Findings: A geographically appearing area of low-attenuation is   appreciated which primarily involves the right occipital lobe. This   finding is well demarcated and has the appearance of a region of subacute   to chronic infarction with the right posterior cerebral artery   distribution. There is no evidence of intra-axial nor extra-axial fluid     collections nor evidence of acute hemorrhage. There are diffuse areas of   low-attenuation within the subcortical, deep, and periventricular white   matter regions. Diffuse low-attenuation is appreciated within the right   lobe of the thalamus, and in the posterior aspect of the corpus callosum   on the right. Reflecting callosal and thalamic perforator of involvement.    There is evidence of approximately 3 to 4 mm of subfalcine herniation to   the right at the level of the thalamus.    There is partial effacement of the right lateral ventricle. There is no   evidence of a depressed skull fracture. The visualized paranasal sinuses  and mastoid air cells are patent.    IMPRESSION:  Findings consistent with an area of subacute to chronic   infarction within a right PCA distribution.  2. Small vessel white matter ischemic changes are also appreciated.  3. A mass within the area of low-attenuation cannot be excluded though   was of lower differential consideration. These findings appear to   primarily represent cytotoxic edema. Does not appear to be significant   vasogenic edema other than the findings involving the thalamus.          Verified By: Mikki Santee, M.D., MD    17-Apr-13 11:06, CT  Head Without Contrast   CT Head Without Contrast    REASON FOR EXAM:    recent CVA, progressive h/a  COMMENTS:       PROCEDURE: CT  - CT HEAD WITHOUT CONTRAST  - Mar 29 2012 11:06AM     RESULT: CT without  contrast is compared to the previous examination dated   26 March 2012 area and is again right occipital and posterior parietal   infarct. There is no intracranial hemorrhage, mass or mass effect. There   is not appear progression of infarct compared to the previous study.   There is progressive maturation of the ischemic region consistent with an   evolving subacute to late subacute process. There is some effacement of   some of the sulci in the right parietal region which is of uncertain   significance. Followup is recommended. No hemorrhage is evident. There is   minimal mass effect on thethird ventricle with minimal midline shift   toward the left seen on image 6 measuring approximately 3.5 mm. There is     no evolving hydrocephalus. The calvarium appears intact. The sinuses and   mastoids are clear. There is low-attenuation within the right thalamus as   noted previously. This is the area causing some mass effect on the third   ventricle.    IMPRESSION:   1. Evolving infarct in the right PCA distribution. There is some   effacement of the sulci in the right parietal region. Correlate for acute   right MCA distribution early ischemic event. No acute hemorrhage or   evidence of ventriculomegaly compared to the previous study.          Verified By: Sundra Aland, M.D., MD   Assessment/Plan:  Invasive Device Daily Assessment of Necessity:   Does the patient currently have any of the following indwelling devices? foley   Assessment/Plan:   Assessment IMP CVA NQMI/ HTN AFIB CM CHF AMS DM Smoking CRI .    Plan PLAN ASA Agree with LOvenox Long term anticoug with coumadin or Xarelta BP control agree with DM meds Rec rehab Continue CHF management Agree with neurology input SNF with rehab   Electronic Signatures: Lujean Amel D (MD)  (Signed 17-Apr-13 21:52)  Authored: Chief Complaint, VITAL SIGNS/ANCILLARY NOTES, Brief Assessment, Lab Results, Radiology  Results, Assessment/Plan   Last Updated: 17-Apr-13 21:52 by Yolonda Kida (MD)

## 2015-04-06 NOTE — Consult Note (Signed)
PATIENT NAME:  [name], LAD MR#:  [id number] DATE OF BIRTH:  01-14-1961  DATE OF CONSULTATION:  03/30/2012  REFERRING PHYSICIAN:   CONSULTING PHYSICIAN:  Evan Mackie A. Ether Griffins, DPM  REASON FOR CONSULTATION: Left foot ulceration.   HISTORY OF PRESENT ILLNESS: This is a 54 year old gentleman who was admitted with a recent fall. He apparently has had recent mental status changes. He was found to have left-sided weakness. Concern for a left-sided CVA has been noted. Also concern for a possible non-Q-wave MI, per cardiology. He has a history of chronic left foot ulceration and has apparently been managed in the outpatient wound care facility and has also been on antibiotics. Per outpatient notes, I was able to find that he had a MRSA infection with group B strep and possible osteomyelitis. He is not able to provide much review of systems at this time as he has a fairly flat affect. Physical therapy is attempting to work with him as he is quite unstable at this time.   PAST MEDICAL HISTORY:  1. Hypertension. 2. Diabetes, uncontrolled. 3. Hyperlipidemia. 4. Coronary artery disease.   HOME MEDICATIONS:  1. Aspirin 325 mg daily. 2. Metformin 850 mg three times daily. 3. Humalog 100 units/mL 45 units subcutaneous three times daily.  4. Glimepiride 4 mg twice a day. 5. Hydrochlorothiazide 50 mg daily.  6. Furosemide 40 mg daily. 7. Lisinopril 80 mg once a day. 8. Tramadol p.r.n.   ALLERGIES: Penicillin.   SOCIAL HISTORY: Denies tobacco. Drinks alcohol occasionally. 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   REVIEW OF SYSTEMS: See above. The patient is unable to provide much review of systems.   PHYSICAL EXAMINATION   GENERAL: He has a flat affect at this time.   VASCULAR: He has nonpalpable pulses to the lower extremities.   NEUROLOGIC: He appears to be completely neuropathic to lower extremities.   DERM: On the lateral aspect of his fifth MTPJ, he has a noted ulceration that probes deep down  to bone. There is no purulent drainage from the area. No surrounding erythema or foul odor from the area. On the plantar aspect of the right foot, he also has a noted superficial abrasion/ulceration. These are noninfected as well. On the dorsomedial first metatarsophalangeal joint there is a small abrasion pressure sore, at his first metatarsophalangeal joint. He also has multiple superficial abrasions on both his right and leg left lower legs.   MUSCULOSKELETAL: He has diffuse edema to the lower extremities.   ASSESSMENT:  1. Diabetic neuropathic with chronic osteomyelitis, left fifth MTPJ.  2. Superficial ulcerations.  3. Severe peripheral vascular disease.   PLAN: We will order an x-ray of his left foot to evaluate the extent of his possible osteomyelitis, in his left foot. We will have nursing continue with daily dressing changes and wound care to the area. He is not very good candidate for surgical intervention with the recent CVA and possible MI. I am inclined to treat this conservatively with antibiotics and local wound care, which can be continued at the [facility]. He is currently non-weight bearing as this will assist [place]           with healing some of the plantar wounds. We need to be careful with strict pressure relief off the heels at this time. We will await the results of the x-ray and give further input after the results have been obtained. ____________________________ [place] Donovan. Ether Griffins, DPM jaf:slb D: 03/30/2012 12:35:41 ET T: 03/30/2012 15:12:49 ET JOB#: [id number]  cc: Jill AlexandersJustin A. Ether GriffinsFowler, DPM, <Dictator> Zilda No DPM ELECTRONICALLY SIGNED 04/04/2012 11:23

## 2015-04-06 NOTE — Consult Note (Signed)
PATIENT NAME:  [name] MR#:  [id number] DATE OF BIRTH:  May 30, 1961  DATE OF CONSULTATION:  03/31/2012  REFERRING PHYSICIAN:  Dr Alberteen Spindleline.   CONSULTING PHYSICIAN:  Rosalyn GessMichael E. Watson Robarge, MD  REASON FOR CONSULTATION: Chronic osteomyelitis of the left fifth toe.   HISTORY OF PRESENT ILLNESS: The patient is a 54 year old white man with a past history significant for diabetes and chronic osteomyelitis of the left fifth toe, who had been on IV antibiotics for several weeks and who was admitted on 03/26/2012 following a fall. He was subsequently diagnosed with an acute stroke and was found to be in new onset of atrial fibrillation. There is also concern that he may have had a non-ST elevation myocardial infarction. The patient has been in the hospital receiving supportive care for the stroke. He is clinically doing somewhat better with some improved function, but remains with left-sided weakness. He is mildly confused but is still able to provide history. Per the notes from his primary care doctor, he has not been very compliant with his diabetes in the past. The patient was noted to continue to have some ulcerations on his feet. He has been seen by Podiatry who has ordered plain films of the feet which have demonstrated osteomyelitis. The patient has not been on any antibiotics recently. [facility] Per the family. There was staph and VRE present. He received IV antibiotics although the name of the antibiotic was not known to the family. He subsequently, following the removal of his IV catheter in October 2012, received oral linezolid for a period of time. I have no records of his care for his osteomyelitis nor any blood work nor any culture results. The patient denies any pain in his feet due to his peripheral neuropathy.   ALLERGIES: Penicillin.   PAST MEDICAL HISTORY:  1. Poorly controlled diabetes.  2. Hypertension.  3. Osteomyelitis of the left fifth toe, status post IV  antibiotics followed by oral linezolid.    4. Coronary artery disease, status post stent placement.  5. Congestive heart failure.   SOCIAL HISTORY: The patient lives by himself. He does not smoke. He does not drink.   FAMILY HISTORY: Positive for stroke, coronary artery disease, and myocardial infarction.  REVIEW OF SYSTEMS: GENERAL: No fevers, chills, sweats. No sinus congestion or sore throat. He does have some headache currently. NECK: No stiffness. No swollen glands. RESPIRATORY: No cough. No shortness of breath. No sputum production. CARDIAC: No chest pains or palpitations. GI: No nausea, no vomiting, no abdominal pain. He has been having diarrhea per the family. GU: No complaints. MUSCULOSKELETAL: He has no pain in his lower extremities. NEUROLOGIC: He has left-sided weakness and possibly some visual field deficits. He has some confusion as well. SKIN: He has multiple ulcerations over his feet and his right shin. PSYCHIATRIC: No complaints. All other systems are negative.   PHYSICAL EXAMINATION:  VITAL SIGNS: T-max of 100.4, T current of 96.5, pulse 95, blood pressure 112/82, 95% on 2 liters.   GENERAL: A 54 year old white man in no acute distress.   HEENT: Normocephalic, atraumatic. Lips were within normal limits.   NECK: Midline trachea. No lymphadenopathy. No thyromegaly.   LUNGS: Clear to auscultation bilaterally. Good air movement. No focal consolidation.   HEART: Regular rate and rhythm without murmur, rub, or gallop.   ABDOMEN: Soft, nontender, nondistended. No hepatosplenomegaly. No hernias noted.   EXTREMITIES: No evidence for tenosynovitis.   SKIN: He had multiple ulcerations on his  right foot mainly in the plantar aspect. These are relatively small and superficial. There did not appear to be any surrounding erythema nor any purulent discharge from these areas. The right shin had similar albeit larger ulceration that appeared to be healing fairly well. The left foot was  wrapped in bandages and was not directly observed. There is no evidence for lymphangitic streaking. He did not appear to have significant swelling in the lower extremities.   NEUROLOGIC: The patient was awake and interactive. He was mildly confused. He had decreased movement of the left side including the upper and lower extremities.   PSYCHIATRIC: Mood and affect appeared normal.   LABORATORY DATA: BUN of 34, creatinine 1.35. White count of 11.8 with a hemoglobin of 11.1, platelet count of 298,000.  ANC of 9.9. White count on admission was 18.0. Blood cultures from admission show no growth. A urinalysis had greater than 500 glucose, 2+ blood, greater than 500 protein, negative nitrites, negative leukocyte esterase, 3 red cells and 4 white cells per high-powered field. Urine culture is growing mixed bacterial flora. Plain films of the left foot shows bony destruction of the MP joint of the fifth toe involving the distal metatarsal and proximal portion of the proximal phalanx.   IMPRESSION: A 54 year old white man with a past history significant for diabetes, left fifth toe osteomyelitis admitted with acute stroke, new onset atrial fibrillation, and possible non-ST-segment elevation myocardial infarction who has evidence for continued osteomyelitis.   RECOMMENDATIONS:  1. His x-ray of his foot shows evidence for osteomyelitis.  His family reports a prior staph and VRE on cultures. It is unclear what antibiotics he received IV but he appears to be given oral linezolid following IV therapy. The osteomyelitis is a chronic process and given his other severe comorbid issues is less of a priority.    2. He has been seen by Podiatry. They would prefer to avoid debridement at this time due to his stroke and cardiac issues.  3. We do not have any current cultures, and I do not have the results of his prior cultures available at this time.  4. Podiatry does not feel that there is significant inflammation. We will  check a sedimentation rate and C-reactive protein at this time. This could be up somewhat due to the stroke, however.  5. If he has active bone infection and there is any necrotic bone present then cure of this would be unlikely without debridement. Options include IV therapy, oral suppressive therapy, or watchful waiting. Without debridement there is little reason to give him aggressive IV therapy.  Watchful waiting would likely allow the infection to worsen over time putting him at increased risk for below-the-knee amputation.  6. Will start him on oral doxycycline for suppressive therapy. There is no reason to expect that this would eradicate the infection, however. Doxycycline would possibly treat VRE and staph but I cannot be certain of this without cultures.  7. Would follow his INR as doxycycline will affect the Coumadin levels.          This is a moderately complex infectious disease case. Thank you very much for involving me in [name] care.     ____________________________ Rosalyn Gess. Delon Revelo, MD meb:vtd D: 03/31/2012 15:30:23 ET T: 04/01/2012 09:35:58 ET JOB#: [id number]  cc: Rosalyn Gess. Jacquline Terrill, MD, <Dictator> Araeya Lamb E Mindel Friscia MD ELECTRONICALLY SIGNED 04/03/2012 11:06

## 2015-04-06 NOTE — H&P (Signed)
PATIENT NAME:  [name], [name] MR#:  [id number] DATE OF BIRTH:  1961/02/17  DATE OF ADMISSION:  03/26/2012  ED REFERRING PHYSICIAN: Dr. Governor Rooks PRIMARY CARE PHYSICIAN: Dr. Daniel Nones   CHIEF COMPLAINT: Status post fall.  HISTORY OF PRESENT ILLNESS: Patient is a 54 year old white male with history of hypertension, coronary artery disease, previous history of left foot ulcer, congestive heart failure, who apparently has been "wobbly on his feet" for a few weeks now. He actually fell last week and had to receive stitches on his chin. About 24 hours ago was in his bed and fell out of the bed onto the floor and he states that he could not get up. When his family went to check on him they noticed that he was on the floor therefore EMS was called. When they arrive they noticed patient with multiple abrasions in his lower extremities and scratches on his body. Patient since being in the ED was noticed to have left upper extremity and left lower extremity weakness. He is also noticed to have acute renal failure as well as troponin of 3.20. Therefore, I am asked to admit the patient. Patient reports that besides having difficulty with ambulating for the past few weeks he has not had any chest pain or palpitations. He has had shortness of breath for about one month mainly occurring at nighttime. He reports that he wakes up with shortness of breath and feels like a panic attack. He denies any dyspnea on exertion or orthopnea. He does have chronic lower extremity swelling. Patient otherwise denies any difficulty with speech or swallowing. Denies any numbness. He himself does not feel that his left side is weak. Otherwise has had decrease in appetite over the past few weeks. Denies any abdominal pain, nausea, vomiting, or diarrhea.   PAST MEDICAL HISTORY:  1. Hypertension.  2. 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  3. History of coronary  artery disease, status post three stents.  4. History of left foot ulcer, receiving IV antibiotics.  5. Previous history of congestive heart failure.   PAST SURGICAL HISTORY: Only surgery he had was Hickman catheter that was placed and subsequently removed.   ALLERGIES: Penicillin.   CURRENT MEDICATIONS:  1. Metformin 850 mg t.i.d.  2. Humalog Lispro 45 units t.i.d.  3. Glimepiride 5 mg p.o. b.i.d.  4. Enteric coated aspirin 325. 5. HCTZ 50 p.o. daily.  6. Tramadol 50 q.6 p.r.n.  7. Lasix 40 daily.  8. Lisinopril 80 mg daily.   SOCIAL HISTORY: No history of smoking. No alcohol. He lives alone.   FAMILY HISTORY: Mother had a CVA as well as coronary artery disease. Brother also had a heart attack.    REVIEW OF SYSTEMS: CONSTITUTIONAL: Denies any fevers. Complains of fatigue, weakness. Denies any pain, weight loss, weight gain. EYES: No blurred or double vision. No pain. No redness. No inflammation. No glaucoma. No cataracts. ENT: No tinnitus. No ear pain. No hearing loss. No seasonal or year-round allergies. No epistaxis. No nasal discharge. No snoring. No postnasal drip. No difficulty with swallowing. RESPIRATORY: No cough. No wheezing. No hemoptysis. Does complain of some dyspnea. No asthma. No painful respirations. No chronic obstructive pulmonary disease. No tuberculosis. No pneumonia. CARDIOVASCULAR: Denies any chest pain, orthopnea. Has chronic edema. No arrhythmias. No palpitations. No syncope. GASTROINTESTINAL: No nausea, vomiting, diarrhea. No abdominal pain. No hematemesis. No melena. No ulcers. No gastroesophageal reflux disease. No irritable bowel  syndrome. No jaundice. No rectal bleeding. GENITOURINARY: Denies any dysuria, hematuria, renal colic or frequency. ENDO: Denies any polyuria, nocturia, or thyroid problems. Does have diabetes which is poorly controlled. HEME/LYMPH: Denies any anemia, easy bruisability, or bleeding. SKIN: Denies any acne. Does have abrasions related to his  fall. MUSCULOSKELETAL: Denies any pain in neck, back, or shoulder. No gout. NEUROLOGICAL: Has left-sided weakness. Denies any numbness. No previous history of cerebrovascular accident, transient ischemic attack, or seizures. PSYCHIATRIC: No anxiety. No insomnia. No ADD. No OCD.   PHYSICAL EXAMINATION:  VITAL SIGNS: Temperature 98, pulse on presentation 121.   GENERAL: Patient is a 54 year old white male appears very dehydrated and acutely ill.   HEENT: Head atraumatic. He has raised area in his posterior neck which he reports is chronic. There is no induration or softness to it. He reports that this is lifelong. Pupils equal, round, reactive to light and accommodation. Extraocular movements intact. There is no conjunctival pallor. No scleral icterus. Nasal exam shows no ulceration or drainage. Oropharynx is clear without any exudates.   NECK: There is no thyromegaly. No carotid bruits.   CARDIOVASCULAR: Tachycardic. No murmurs, rubs, clicks, or gallops. PMI is not displaced.   LUNGS: Clear to auscultation bilaterally without any rales, rhonchi, wheezing.   ABDOMEN: Soft, nontender, nondistended. Positive bowel sounds x4.   EXTREMITIES: He has got 1+ edema. He has got multiple abrasions on his skin. He has got some superficial ulcerations on his foot, also his bilateral feet are cold to touch which is according to him a chronic issue.   VASCULAR: Diminished DP, PT pulses in both lower extremities.   MUSCULOSKELETAL: There is no erythema or swelling.   NEUROLOGIC: Patient currently is awake, oriented to place, person, time. Cranial nerves II through XII grossly intact. Strength is diminished in the left upper extremity and left lower extremity. Strength is 4/5 in the left upper and left lower extremity. Reflexes are 2+. Babinski is downgoing. Sensation intact in the lower extremities.   PSYCHIATRIC: Currently not anxious or depressed.   LYMPHATICS: No lymph nodes palpable.   SKIN: Abrasion  on multiple areas of his body.   LABORATORY, DIAGNOSTIC AND RADIOLOGICAL DATA: Evaluation in the ED: Glucose 418, BUN 34, creatinine 1.61, sodium 137, potassium 4.7, chloride 99, CO2 23, anion gap 15, calcium 9.0. LFTs: Total protein 6.7, albumin 2.8, bilirubin total 0.8, alkaline phosphatase 180, AST 56, ALT 29, CPK 647, troponin 3.20, WBC 18.0, hemoglobin 12.7, platelet count 445. EKG shows sinus tachycardia with no acute ST-T wave changes. CT scan of the head shows findings consistent with area of subacute to chronic infarction with right PCA distribution, small vessel white matter ischemic changes appreciated. Mass within the area of low attenuation cannot be excluded. Possibly related to cytotoxic edema.   ASSESSMENT AND PLAN: Patient is a 54 year old white male with history of hypertension, coronary artery disease, congestive heart failure, has been unsteady with walking for the past one month, he fell 24 hours ago from bed and could not get up. Family found him on the floor. He was awake, brought to the ED with left-sided weakness.  1. 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 At this time will go ahead and admit the patient. Get a MRI of his brain. Will have neurology evaluate for further recommendations. Will check carotid Doppler's. Patient was on full dose aspirin in light of his acute stroke and also possible non-ST myocardial infarction. At this time  I will go ahead and decrease his aspirin. Will add Plavix to his current regimen. Will get also get PT and OT evaluation and swallow evaluation. His gag reflex is currently intact.  2. Possible non-ST myocardial infarction. Will place him on aspirin. No heparin or Lovenox full dose due to possible acute cerebrovascular accident. Will get echocardiogram. Will consult cardiology for any further recommendations.  3. Acute renal failure likely due to dehydration  as well as concurrent use of Lasix as well as hydrochlorothiazide. At this time will discontinue all his nephrotoxic drugs and give him IV fluids. Follow his renal function.  4. Leukocytosis, possibly reactive. Will get a set of blood cultures. Will get urine cultures. Will get a chest x-ray. Hold off on any antibiotics for time being.  5. Diabetes, which is very poorly controlled, likely in light of patient not being able to take his medication over the last 24 hours. Patient reviewing in [facility] records, he was on Lantus which was apparently discontinued. He is only on short acting Humalog, likely due to cost issues. I am going to go ahead and start him on 75/25 insulin and also place him on high dose sliding scale insulin. If his blood sugars do not improve he may need an insulin drip.  6. History of MRSA. Will place him on isolation precautions.  7. Miscellaneous: Will place him on Lovenox for deep vein thrombosis prophylaxis.   TIME SPENT: 45 minutes spent.  ____________________________ Lacie Scotts. Allena Katz, MD shp:cms D: 03/26/2012 14:21:23 ET T: 03/26/2012 14:45:57 ET  JOB#: [id number] cc: Imya Mance H. Allena Katz, MD, <Dictator> Lynnea Ferrier, MD Charise Carwin MD ELECTRONICALLY SIGNED 03/27/2012 13:13

## 2015-04-06 NOTE — Consult Note (Signed)
PATIENT NAME:  [name], REPPOND MR#:  [id number] DATE OF BIRTH:  04-29-1961  DATE OF CONSULTATION:  03/27/2012  REFERRING PHYSICIAN:  Dr. Allena Katz CONSULTING PHYSICIAN:  Rose Phi. Kemper Durie, MD  HISTORY: Mr. Rosendahl is a 54 year old right-handed divorced white patient of Dr. 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 He was admitted 03/26/2012 for  being status post a fall. He is referred for evaluation of stroke. History comes from the patient, his hospital chart and hospital records.   The patient was brought to the Emergency Room at noon on 03/26/2012 by EMTs summoned by the family to the patient's home after the patient had been found on the floor. The admission note includes report of history from the patient that he had fallen out of bed 24 hours earlier and not able to get up, that he had been "wobbly on his feet" for a few weeks, and that for approximately a month he has had symptoms at night of orthopnea, waking from sleep short of breath as if having a panic attack.   On admission, he was noted to have left upper extremity weakness. The patient's laboratory studies were notable for a glucose of 418, hemoglobin A1c of 11.5%, BUN 34, creatinine 1.6, CPK 647, troponin 3.20, white blood count 18,000. Brain CT scan was very consistent with subacute nonhemorrhagic infarction of the right posterior cerebral artery territory. Brain MRI scan shows recent nonhemorrhagic infarction of the right posterior cerebral artery territory as well as the right thalamus region.   The patient denies any prior history of stroke or temporary  stroke. He continued to work and to drive to [facility] through Friday 03/24/2012.   PHYSICAL EXAMINATION: The patient is a well-developed and more than moderately overweight white gentleman  examined lying semisupine, in no apparent distress. Blood pressure is 125/90, heart rate 100. He was normocephalic without evidence of trauma. His neck was supple. He was alert with clear speech and normal expression. He was oriented to person, location, not fully to time, and not appropriately to situation. He had more than moderate left side neglect. He followed commands appropriately with right arm and leg. He could be induced to move the left arm and leg on motor power testing. Cranial nerve examination was notable for mild upper motor neuron left facial weakness and for complete left homonymous hemianopsia. On motor examination of extremities, there was symetric normal  tone throughout. Power was normal proximally and distally in the right arm and leg. With exhortation, best elicitable power on the left was rated greater than or equal to 4 out of 5 in the upper extremity proximally and distally and approximately 4- out of 5 in the left lower extremity. On sensory examination, there was extinction of light touch and sharp perception in the left arm and leg. Reflexes were diffusely decreased. Gait was not tested.   IMPRESSION: Left homonymous hemianopsia, mild-to-moderate left side weakness of left  arm and face, and significant left side neglect.   RECOMMENDATIONS:  1. I agree with his present work-up and treatment in hospital including imaging and laboratory studies.  2. I agree with indication for Speech and Language Pathology and Physical Therapy evaluations. He has been ordered Occupational Therapy evaluation with regard to use of the hand and arm and also with regard to his significant neglect.  3. Consider anticoagulation in  the setting of stroke suggestive of embolic event.   I appreciate being asked to see this pleasant and interesting gentleman.   ____________________________ Rose PhiPeter R. Kemper Durielarke, MD prc:cbb D: 03/30/2012 15:17:27 ET T: 03/30/2012 17:40:18 ET JOB#: [id number]  cc: Rose PhiPeter R.  Kemper Durielarke, MD, <Dictator> Gaspar GarbePETER R Karisma Meiser MD ELECTRONICALLY SIGNED 04/05/2012 11:01

## 2015-04-06 NOTE — Consult Note (Signed)
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  His xray of the foot shows evidence for osteomyelitis.  His family reports prior Staph and VRE on cultures.  It is unclear what antibiotics he received IV, but he appears to have been given oral linezolid following the IV therapy.  The osteomyelitis is a chronic process and given his severe other comorbid issues is less of a priority. He has been seen by podiatry.  They would prefer to avoid debridement at this time due to his CVA and cardiac issues. We do not have any current cultures and I do not have the results of his prior cultures available at this time. Podiatry does not feel that there is significant inflammation.  Will check his ESR and CRP at this time. These could be up somewhat due to the CVA. If he has active bone infection and there is any necrotic bone present, then cure of this is unlikely without debridement.  Options include IV therapy, oral suppressive therapy or watchful waiting.  Without debridement, there is little reason to give him aggressive IV therapy.  Watchful waiting would likely allow the infection to worsen over time.  Will start him on doxycycline for suppressive therapy.  There is no reason to expect that this will eradicate the infection.  Doxcycyline will possibly treat the VRE and Staph, but I cannot be certain of this without cultures. Would follow INR as doxcycyline will affect coumadin levels.   Electronic Signatures: Yanixan Mellinger, Heinz Knuckles (MD)  (Signed on 19-Apr-13 15:19)  Authored  Last Updated: 19-Apr-13 15:19 by Zephan Beauchaine, Heinz Knuckles (MD)

## 2015-04-06 NOTE — Consult Note (Signed)
PATIENT NAME:  [name], [name] DATE OF BIRTH:  1961-08-17  DATE OF CONSULTATION:  03/27/2012  REFERRING PHYSICIAN:  Daniel NonesBert Klein, MD CONSULTING PHYSICIAN:  Dwayne D. Callwood, MD  INDICATION: Cerebrovascular accident, altered mental status, cardiomyopathy.   HISTORY OF PRESENT ILLNESS: [name] is a 54 year old white male with history of hypertension, coronary artery disease, chronic left foot ulcer, congestive heart failure,  cardiomyopathy, hypertension, noncompliance, diabetes who reportedly had been feeling wobbly on his feet for a few weeks now. He actually fell a week ago and received stitches to his chin. About 24 hours prior to his admission he was in bed and fell out of bed onto the floor and states that he could not get up. When the family went to check on him, noticed that he was on the floor and, therefore, EMS was called. When they arrived, they noticed the patient had multiple abrasions to his lower extremities and scratches on his body. Since he has been in the ER, he noticed weakness on the left side of his body. Also, noted to have acute renal failure, elevated troponin. He was admitted for further evaluation. He had had some difficulty ambulating. Denied any chest pains or palpitations. He has had some shortness of breath for about a month, mostly at night, occasionally woke him up and like in a panic attack. He had paroxysmal nocturnal dyspnea and orthopnea. He has had some lower extremity swelling. Denied fever, chills, or sweats.   REVIEW OF SYSTEMS: Possible syncope, possible blackout spells. No weight loss. No weight gain, hemoptysis, or hematemesis. Denied bright red blood per rectum. No vision change or hearing change. Denies sputum production or cough. Chronic leg ulcer.   PAST MEDICAL HISTORY:  1. Hypertension. 2. Hyperlipidemia. 3. Cardiomyopathy. 4. Congestive heart failure. 5. Diabetes. 6. Chronic leg ulcer. 7. Peripheral vascular disease. 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   PAST SURGICAL HISTORY: Hickman catheter, which has since been removed.   ALLERGIES: Penicillin.   MEDICATIONS:  1. Metformin 850 three times a day. 2. Humalog. 3. Lispro 45 units 3 times daily. 4. Glimepiride 5 mg twice a day.  5. Aspirin 325 mg daily.  6. HCTZ 50 mg a day.  7. Tramadol 50 q.6 p.r.n.  8. Lasix 40 mg a day.  9. Lisinopril 80 daily. Marland Kitchen.   FAMILY HISTORY: Cerebrovascular accident, coronary artery disease, myocardial infarction.   SOCIAL HISTORY: He denies smoking. No alcohol consumption. He lives alone.   PHYSICAL EXAMINATION:  VITAL SIGNS: Blood pressure 130/70 at the time I saw him. Pulse 110 and regular, respiratory rate 16, temperature 98.   HEENT: Normocephalic, atraumatic. Pupils equal and reactive to light.   NECK: Supple. No significant jugular venous distention, bruits, or adenopathy.   LUNGS: Clear to auscultation and percussion. No significant wheeze, rhonchi, or rale.   HEART: Regular rate and rhythm. Systolic ejection murmur left sternal border. PMI is nondisplaced.   ABDOMEN: Benign. Positive bowel sounds. No rebound, guarding, or tenderness.   EXTREMITIES: Trace edema. Chronic abrasion also is on his left foot.   NEUROLOGIC: Left-sided weakness 3/5.   SKIN: Occasional abrasions.   LABORATORY, RADIOLOGICAL AND DIAGNOSTIC DATA: Glucose 418, BUN 34, creatinine 1.6, sodium 137, potassium 4.7. LFTs: AST 56, ALT 29. CK 647, troponin III 0.2. White count 18, hemoglobin 12.7, platelet count 445. EKG: Sinus tachycardia, nonspecific ST-T wave changes. CT of the head: No stroke, consistent with subacute to chronic infarction in the right PCA distribution  with small vessel white matter ischemic changes.   ASSESSMENT:  1. Cerebrovascular accident.  2. Altered mental status. 3. Elevated troponin, possible non-Q-wave myocardial infarction.  4. Congestive heart failure.  5. History of  hypertension. 6. Diabetes.  7. History of hyperlipidemia. 8. Renal insufficiency. 9. Methicillin Resistant Staphylococcus Aureus. 10. Peripheral vascular disease. 11. Recurrent leg infection.   PLAN:  1. Agree with admit.  2. Hydration. 3. Anticoagulation.  4. Follow-up echocardiogram.  5. Place on telemetry.  6. Antibiotics for his leg ulcers. 7. Consult neurology for left-sided cerebrovascular accident.  8. Continue antiplatelets. Will continue in hospital, Lovenox.  9. Follow-up echocardiogram to be sure there is no thrombus. 10. Hydration for renal insufficiency.  11. Consider renal consult if indicated.  12. Continue blood pressure control.  13. Follow-up cardiac enzymes.  14. Follow-up troponins and EKGs.  15. Would probably treat the patient medically for now.     16. Continue Lasix as necessary for PND, orthopnea, leg edema.  17. Continue to treat the patient aggressively medically for now.    ____________________________ Bobbie Stack. Juliann Pares, MD ddc:ap D: 03/28/2012 09:15:00 ET T: 03/28/2012 10:55:06 ET JOB#: [id number]  cc: Dwayne D. Juliann Pares, MD, <Dictator> Alwyn Pea MD ELECTRONICALLY SIGNED 04/19/2012 22:54

## 2015-04-06 NOTE — Discharge Summary (Signed)
PATIENT NAME:  [name], [name] MR#:  [id number] DATE OF BIRTH:  16-Nov-1961  DATE OF ADMISSION:  03/26/2012 DATE OF DISCHARGE:  04/05/2012  FINAL DIAGNOSES:  1. 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  2. Transient atrial fibrillation, thought to be source of stroke secondary to embolism.  3. Acute myocardial infarction.  4. 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  5. Diabetes mellitus, uncontrolled.  6. Accelerated hypertension.  7. 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   HISTORY AND PHYSICAL: Please see dictated admission history and physical.   [place]: The patient was admitted with finding of profound left side weakness, left hemineglect left, and left homonymous hemianopsia. In addition, his cardiac enzymes were elevated consistent with acute myocardial infarction. While on the monitor, he was noted to have transient atrial fibrillation, and it was felt likely that he had gone into atrial fibrillation with rapid rate, which likely precipitated the myocardial infarction and lead to the stroke as well. Cardiology saw the patient, and he was not a candidate for any invasive evaluation secondary to his acute cerebrovascular accident. Neurology saw the patient as well with further medications adjusted. He did have some increased headache, and repeat CT scans were performed, which revealed some cerebral edema secondary to the stroke, however this remained stable on serial CT scans.   He was initiated on anticoagulation, including Coumadin, and he tolerated this well. Adjustment of his medications led to further heart rate and blood pressure control. He was then introduced on low dose ACE inhibitor, and he tolerated this in addition. Fortunately he had some improvement in his left hemineglect, although still with profound weakness. Initially we had  anticipated trying to get into inpatient rehabilitation, and was felt he would not be capable of performing adequately to be able to go through this rigorous rehabilitation.   He has a history of osteomyelitis and was noted to have erythema of his feet as well as multiple abrasions. Podiatry saw the patient and he underwent foot films, which confirmed evidence of osteomyelitis, in the left fifth toe. Infectious Disease saw the patient, and they recommended suppressive therapy at this point, as he is not a candidate for any type of surgical treatment for his osteomyelitis at this point secondary to his acute illnesses. He was placed on doxycycline and he tolerated this without issue.   At this point, the patient will be discharged to a skilled nursing facility. His physical activity will be up with assistance as tolerated. He should be on fall and bleeding precautions, aspiration precautions. He will need fingerstick blood sugars before meals and at bedtime. Dry dressing changes to the left foot as needed. Daily weights. Physician should be contacted for more than 2 pound gain in one day or 5 pounds in one week, or increasing signs and symptoms of congestive heart failure. His diet should be no added salt, no concentrated sweets. Physical therapy, occupational therapy, and speech therapy should evaluate and treat the patient. He will need a MET-B, CBC, urinalysis, INR, and digoxin level in one week with results to the nursing home physician.   DISCHARGE MEDICATIONS:  1. Regular sliding scale insulin.  2. Lopressor 50 mg p.o. twice a day. 3. Neosporin ointment topically daily to the abrasions.  4. Flomax 0.4 mg p.o. at bedtime secondary to urinary obstruction. If Foley can be removed, likely this medication can be stopped.  5. Lovastatin 20 mg p.o. at  bedtime.  6. Enteric-coated aspirin 81 mg p.o. daily.  7. Digoxin 0.125 mg p.o. daily.  8. Lisinopril 5 mg p.o. daily.  9. Doxycycline 100 mg p.o. twice a  day; again this is for suppressive therapy for osteomyelitis and should be continued until stopped by Infectious Disease.  10. Norco 5/325 mg one p.o. every four hours p.r.n. severe pain.  11. Coumadin 3 mg p.o. at bedtime.  12. Lantus 30 units subcutaneous daily.  13. Lacri-Lube to the left eye at bedtime.  14. Artificial Tears to the left eye three times daily.        15. Lasix 40 mg p.o. daily p.r.n. edema.  16. Potassium 10 mEq p.o. daily p.r.n. with Lasix.  ____________________________ Adin Hector, MD bjk:slb D: 04/05/2012 13:35:38 ET T: 04/05/2012 13:50:46 ET JOB#: [id number]  cc: Adin Hector, MD, <Dictator> Heinz Knuckles. Blocker, MD Dwayne D. Clayborn Bigness, MD Ramonita Lab MD ELECTRONICALLY SIGNED 04/07/2012 13:17

## 2015-04-06 NOTE — Consult Note (Signed)
Chief Complaint:   Subjective/Chief Complaint full note will be dictated.  Pt admitted with recent fall and mental status changes.  Poss nqwmi and cva.  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  Followed at wound care center.   Brief Assessment:   Additional Physical Exam Non-palp pulses. Noted neuropathy to lower legs. Skin is thin.  Left foot lateral 5th toe joint ulceration which probes deep.  No purulence.  Mild erythema with ulceration. Right plantar foot with very superficial abrasions and ulceration. Noted edema diffusely to lower legs.   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    Plan will order xray left foot to determine extent of osteo. Pt with recent MI and CVA, not good surgical candidate.  Ulcer appears chronic and has been managed outpt.  Unless ulcer become active purulent, would continue to manage conservative with wound care and abx. Recommend daily dressing changes for now with iodosorb as has been used outpt. Strict pressure relief to heel to prevent ulceration. Pt is bedrest for now until strength returns for ambulation.   Electronic Signatures: Gwyneth RevelsFowler, Sherley Mckenney (MD)  (Signed 18-Apr-13 12:31)  Authored: Chief Complaint, Brief Assessment, Lab Results, Assessment/Plan   Last Updated: 18-Apr-13 12:31 by Gwyneth RevelsFowler, Emmet Messer (MD)

## 2015-04-14 ENCOUNTER — Encounter (HOSPITAL_BASED_OUTPATIENT_CLINIC_OR_DEPARTMENT_OTHER): Payer: Self-pay | Admitting: Registered Nurse

## 2015-05-09 ENCOUNTER — Other Ambulatory Visit (INDEPENDENT_AMBULATORY_CARE_PROVIDER_SITE_OTHER): Payer: Self-pay | Admitting: Internal Medicine

## 2015-05-09 DIAGNOSIS — E0821 Diabetes mellitus due to underlying condition with diabetic nephropathy: Secondary | ICD-10-CM

## 2015-05-09 MED ORDER — METFORMIN HCL 500 MG OR TABS
ORAL_TABLET | ORAL | Status: DC
Start: 2015-05-09 — End: 2015-10-16

## 2015-06-12 ENCOUNTER — Telehealth (INDEPENDENT_AMBULATORY_CARE_PROVIDER_SITE_OTHER): Payer: Self-pay | Admitting: Internal Medicine

## 2015-06-12 NOTE — Telephone Encounter (Signed)
Please see ER/Urgent Care report Scan    ER where seen: [facility]    Recommended Follow-up: NONE    Diagnosis/Reason for Visit: PROCEDURAL SEDATION    F/U appt made yet:No    Next steps: ROUTING TO PCP.

## 2015-06-19 ENCOUNTER — Other Ambulatory Visit (INDEPENDENT_AMBULATORY_CARE_PROVIDER_SITE_OTHER): Payer: Self-pay | Admitting: Internal Medicine

## 2015-06-19 DIAGNOSIS — I10 Essential (primary) hypertension: Secondary | ICD-10-CM

## 2015-06-20 MED ORDER — AMLODIPINE BESYLATE 10 MG OR TABS
ORAL_TABLET | ORAL | Status: DC
Start: 2015-06-20 — End: 2015-10-09

## 2015-06-24 ENCOUNTER — Encounter (INDEPENDENT_AMBULATORY_CARE_PROVIDER_SITE_OTHER): Payer: Self-pay

## 2015-06-24 NOTE — Telephone Encounter (Signed)
Please see Hospital Admin/Discharge report    Hospital where seen:[facility]    Recommended Follow-up: NONE NOTED    Diagnosis/ Reason for visit: DM AND FX ANKLE?    F/U appt made yet:No    ROUTING TO THE RN POOL

## 2015-06-25 NOTE — Telephone Encounter (Signed)
Roger [name] fell and fractured his left ankle.  He was discharged from hospital on 7/2 with instructions to follow up with ortho.    FYI to PCP.

## 2015-07-10 ENCOUNTER — Other Ambulatory Visit: Payer: Self-pay | Admitting: Internal Medicine

## 2015-07-10 DIAGNOSIS — I10 Essential (primary) hypertension: Secondary | ICD-10-CM

## 2015-07-10 NOTE — Telephone Encounter (Signed)
The patient last received this medication at the requesting pharmacy on    05/23/15

## 2015-07-11 MED ORDER — LISINOPRIL 40 MG OR TABS
40.0000 mg | ORAL_TABLET | Freq: Every day | ORAL | Status: DC
Start: 2015-07-11 — End: 2015-10-16

## 2015-08-26 ENCOUNTER — Encounter (INDEPENDENT_AMBULATORY_CARE_PROVIDER_SITE_OTHER): Payer: Self-pay | Admitting: Family Medicine

## 2015-08-26 ENCOUNTER — Ambulatory Visit (INDEPENDENT_AMBULATORY_CARE_PROVIDER_SITE_OTHER): Payer: PPO | Admitting: Family Medicine

## 2015-08-26 ENCOUNTER — Encounter (INDEPENDENT_AMBULATORY_CARE_PROVIDER_SITE_OTHER): Payer: PPO | Admitting: Family Medicine

## 2015-08-26 VITALS — BP 118/73 | HR 112 | Temp 98.1°F | Resp 20 | Wt 231.0 lb

## 2015-08-26 DIAGNOSIS — R319 Hematuria, unspecified: Secondary | ICD-10-CM

## 2015-08-26 LAB — PR U/A AUTO W/MICRO, ONSITE
Bacteria: NEGATIVE /HPF
Casts, URN: NEGATIVE /LPF
Crystals, URN: NEGATIVE /LPF
Epithelial Cells, URN: NEGATIVE /LPF
WBC, URN: NEGATIVE /HPF (ref ?–5)

## 2015-08-26 NOTE — Progress Notes (Signed)
Reason for visit: blood in urine x 5 days. Pt. Also states he is leaking urine when he sleeps as well.    Have you seen a specialist since your last visit: NO     08/26/2015 Roger [name], [place] Maintenance due: DM Foot, DM eye, A1C, Tetanus vaccine, and colonoscopy  Verified WAIIS: WAIIS is down  Last Physical: 12/08/2012    08/26/2015 - Last diabetes related labs:  CHOLESTEROL (LDL)       Date                     Value               Ref Range           Status                12/04/2013               82                  <130 mg/dL          Final            ----------  HEMOGLOBIN A1C       Date                     Value               Ref Range           Status                03/29/2014               5.5                 4.0 - 6.0 %         Corrected             05/09/2013               6.1*                4.0 - 6.0 %                          ----------   ALBUMIN (MICRO), URN       Date                     Value               Ref Range           Status                05/07/2014               2.30                mg/dL               Final            ----------     - Last diabetes related exams:   Last foot exam:  05/01/2014   Last scanned eye exam:  10/16/2012     [place] Maintenance   Topic Date Due   . Hepatitis C Screen  1961-05-27   . HIV Screen  13-Nov-1961   . Diabetes Eye Exam  06/23/1978   . Diabetes Foot Exam  05/23/2014   .  A1c  09/28/2014   . Tetanus Vaccine  11/11/2014   . Colonoscopy  03/11/2015   . Influenza Vaccine (1) 08/14/2015   . Cholesterol Test  12/04/2018       No future appointments.

## 2015-08-26 NOTE — Progress Notes (Signed)
Roger [name] is a 54 year old male here for change in color of the urine  Duration: has been having more problems then in the past  Location: from the ,[place]  Quality: thinks that there is redness to start then there are clots  Timing: getting worse, but off an on  Severity: no pain, no other areas of bleeding no problems with urination at this time.   Context: recently had surgery beginning of june, had a cathter, prior history of prostate cancer , had had surgery in the past   Removal prostate  Associated Signs and Symptoms: no fevers or chills, no back pain  Sees Dr. Jodi Mourning, [facility] in [place].     Outpatient Prescriptions Prior to Visit   Medication Sig Dispense Refill   . Alprostadil, Vasodilator, (CAVERJECT) 20 MCG IntraCAVernous Recon Soln Inject 0.18m intracorporally, NTE 1/24h or 3 times a week. (see below for specs). 1 vial 12   . AmLODIPine Besylate 10 MG Oral Tab TAKE 1 TABLET BY MOUTH ONCE DAILY 90 tablet 0   . Aspirin 81 MG Oral Tab Take 1 tablet (81 mg) by mouth daily. 30 tablet    . Blood Glucose Monitoring Suppl (BLOOD GLUCOSE MONITOR KIT) Does not apply Kit one meter for testing 1-3 times per day 1 Kit 11   . Lisinopril 40 MG Oral Tab Take 1 tablet (40 mg) by mouth daily. - Due for follow up, please make appointment 90 tablet 0   . MetFORMIN HCl 500 MG Oral Tab TAKE 1 TABLET BY MOUTH ONCE DAILY 90 tablet 0   . MetFORMIN HCl 500 MG Oral Tab TAKE 1 TABLET BY MOUTH ONCE DAILY 90 tablet 3   . Multiple Vitamins-Minerals (MULTIVITAMIN & MINERAL OR) 1 tab a day     . ONETOUCH DELICA LANCETS FINE Does not apply Misc Use to check blood sugar up to 3 times daily 300 each 3   . ONETOUCH ULTRA BLUE In Vitro Strip USE TO CHECK BLOOD SUGAR UP TO THREE TIMES DAILY 300 strip 3   . Oxycodone-Acetaminophen 5-325 MG Oral Tab Take 1 tablet by mouth every 6 hours as needed for pain. For Pain. 20 tablet 0   . Sildenafil Citrate 20 MG Oral Tab Take FIVE 20 mg tablets (100 mg) ONE HOUR before sexual activity. 20 tablet 4    . Sildenafil Citrate 20 MG Oral Tab TAKE FIVE 246mtablets (10049mONE HOUR prior to sexual activity. 50 tablet 5   . Tadalafil 5 MG Oral Tab Take 1 tablet (5 mg) by mouth daily as needed for erectile dysfunction. 30 tablet 12     No facility-administered medications prior to visit.       Past Medical History   Diagnosis Date   . Unspecified essential hypertension 1996     on 3 drugs, some side effects from medications.    . Family history of colon cancer      brother died in 40s8s colon cancer    . Type II or unspecified type diabetes mellitus without mention of complication, not stated as uncontrolled 2013   . History of erectile dysfunction        Filed Vitals:    08/26/15 1223   BP: 118/73   Pulse: 112   Temp: 98.1 F (36.7 C)   TempSrc: Temporal   Resp: 20   Weight: 231 lb (104.781 kg)   SpO2: 96%     -------------------------------------------------------------------------------    Review of Systems    Physical  Exam   Constitutional: He is oriented to person, place, and time. He appears well-developed and well-nourished.   HENT:   Head: Normocephalic.   Cardiovascular: Normal rate, regular rhythm and normal heart sounds.    Pulmonary/Chest: Effort normal and breath sounds normal.   Neurological: He is alert and oriented to person, place, and time.   Skin: Skin is warm and dry.   Psychiatric: He has a normal mood and affect. His behavior is normal. Judgment and thought content normal.     Office Visit on 08/26/15   1. U/A AUTO W/MICRO, ONSITE   Result Value Ref Range    Color, Urine RED     Clarity, URN BLOODY     Glucose, Urine  NEG mg/dL    Bilirubin, Urine  NEG    Ketones, URN  NEG mg/dL    Specific Gravity, Urine  1.005 - 1.030    Occult Blood, URN  NEG    pH, URN  5.0 - 8.0    Protein  NEG-TRACE mg/dL    Urobilinogen, URN  0.2 - 1.0 E.U./dL    Nitrite, URN  NEG    Leukocytes  NEG    RBC, Urine 4+,( >30) (A) NEG (0-2)    WBC, URN NEG NEG(0-5) /HPF    Epithelial Cells, URN NEG /LPF    Bacteria NEG /HPF     Casts, URN NEG /LPF    Crystals, URN NEG /LPF    Other           Assessment and Plan    1. Hematuria  Discussed that the patient should get CT scan as the patient has some blood in the urine.  Prior history prostate surgery in the past.  Will refer to urology as well.  As there is clots there is concerns that there is some bleeding in the bladder.   - U/A AUTO W/MICRO, ONSITE  - REFERRAL TO UROLOGY  - CT KUB PROTOCOL

## 2015-08-29 ENCOUNTER — Encounter (INDEPENDENT_AMBULATORY_CARE_PROVIDER_SITE_OTHER): Payer: PPO | Admitting: Family Practice

## 2015-09-04 ENCOUNTER — Telehealth (HOSPITAL_BASED_OUTPATIENT_CLINIC_OR_DEPARTMENT_OTHER): Payer: Self-pay | Admitting: Urology

## 2015-09-04 ENCOUNTER — Telehealth (INDEPENDENT_AMBULATORY_CARE_PROVIDER_SITE_OTHER): Payer: Self-pay | Admitting: Family Medicine

## 2015-09-04 DIAGNOSIS — R319 Hematuria, unspecified: Secondary | ICD-10-CM

## 2015-09-04 DIAGNOSIS — J479 Bronchiectasis, uncomplicated: Secondary | ICD-10-CM

## 2015-09-04 NOTE — Telephone Encounter (Signed)
Patient continues to have renal cysts, these have not particularly changed.  You do have some lung damage, this is long-standing.  The area around the prostate continues to have scar tissue and does not show any significant concerns although because the test is not specific for lumps or bumps in this area and was unable to fully assess the tissue.  Please follow up with urology as discussed.

## 2015-09-04 NOTE — Telephone Encounter (Signed)
(  TEXTING IS AN OPTION FOR [facility] ONLY)  Is this a [place] clinic? No      RETURN CALL: General message OK      SUBJECT:  General Message     REASON FOR REQUEST: Patient called in wanting to schedule an appointment to see Dr Jodi Mourning as he is experiencing blood in his urine. Please assist. Thank you.     MESSAGE: na

## 2015-09-04 NOTE — Telephone Encounter (Signed)
Provider message relayed to patient.  Reports was a non-smoker and does not have any respiratory diseases.  Would like to know if lung damage was caused by radiation treatments and is unable to get into Urology until next year.      Forwarding to Referral pool to assist with referral concerns

## 2015-09-04 NOTE — Telephone Encounter (Signed)
Forwarding to provider to advise on imaging results.

## 2015-09-04 NOTE — Telephone Encounter (Signed)
(  TEXTING IS AN OPTION FOR [facility] ONLY)  Is this a [place] clinic? Yes. Patient declined the option to receive mobile text messages.      RETURN CALL: General message OK      SUBJECT:  General Message     REASON FOR REQUEST: Patient had a CT scan last Thursday and thought that he was going to get a call back regarding the results however he still hasn't heard anything. Please assist.     MESSAGE: na

## 2015-09-05 NOTE — Telephone Encounter (Signed)
Called and spoke with patient should follow up with CT scan of lungs in 1-2 months.   Will try and get the urology to the patient in.  Within 1-2 weeks for hematuria.   Can you help him with this?

## 2015-09-08 NOTE — Telephone Encounter (Signed)
Spoke to [facility] s/p radiation last Feb. States had some bleeding and tissue sloughing was seen by his PCP and CT obtained, PCP recommended he schedule f/u with Dr Jodi Mourning. States his hematuria has stopped. Informed he may see hematuria on and off and this would not be unexpected with his situation. Instructed to always increase his fluids substantially to clear blood from bladder, voiced understanding. Follow up appt scheduled.

## 2015-09-10 NOTE — Telephone Encounter (Signed)
patient referral was reviewed and scheduled with Dr Jodi Mourning on 11/03/2015.  Provider could do an urgent referral and have information reviewed or contact Dr Jodi Mourning to discuss.

## 2015-09-26 ENCOUNTER — Encounter (INDEPENDENT_AMBULATORY_CARE_PROVIDER_SITE_OTHER): Payer: Self-pay | Admitting: Family Medicine

## 2015-09-29 ENCOUNTER — Telehealth (HOSPITAL_BASED_OUTPATIENT_CLINIC_OR_DEPARTMENT_OTHER): Payer: Self-pay | Admitting: Urology

## 2015-09-29 ENCOUNTER — Inpatient Hospital Stay: Payer: Self-pay

## 2015-09-29 NOTE — Telephone Encounter (Signed)
Spoke to [place], states he had hematuria on and off last time week 9/22 he had increase fluids per instruction and had cleared. However he states he has only had a small amount of urination over the past 4 days and has noted some thick blood like discharge. Denies suprapubic pain or discomfort, fever or chill. States he has continued to drink a good amount of fluid intake daily, denies LE edema, no audible wheezing noted. He continue to states he has been dx with diabetes and has not been taking his medication. Informed if he should be having more urine output than reported. Instructed to go to ED tonight for assessment, voiced understanding,and agreed to be seen tonight.

## 2015-09-29 NOTE — Telephone Encounter (Signed)
(  TEXTING IS AN OPTION FOR [facility] ONLY)  Is this a [place] clinic? No      RETURN CALL: Detailed message on voicemail only      SUBJECT:  General Message     REASON FOR REQUEST: Triage    MESSAGE: Unable to urinate for 4 days execpt for once with dried blood and with very little urine. Patient reports waking up in the morning with spots of blood. Has been doing everything he can to "flush his system" and would like to be triaged to determine what his next move should be. Please contact as soon as possible, thank you!

## 2015-09-30 ENCOUNTER — Encounter (INDEPENDENT_AMBULATORY_CARE_PROVIDER_SITE_OTHER): Payer: Self-pay

## 2015-10-01 ENCOUNTER — Encounter (INDEPENDENT_AMBULATORY_CARE_PROVIDER_SITE_OTHER): Payer: Self-pay

## 2015-10-01 NOTE — Telephone Encounter (Signed)
Roger [name] informed calling to f/u out come of ED assessment. Requested he call back with update.

## 2015-10-07 NOTE — Telephone Encounter (Signed)
I have attempted to f/u with pt several times since 10/17. Today I left a message with his EC Jan, LVM requesting she call with update.

## 2015-10-08 NOTE — Telephone Encounter (Signed)
I have not been able to contact pt or family member for follow up. Closing encounter at this time.

## 2015-10-09 ENCOUNTER — Other Ambulatory Visit: Payer: Self-pay | Admitting: Internal Medicine

## 2015-10-09 DIAGNOSIS — I10 Essential (primary) hypertension: Secondary | ICD-10-CM

## 2015-10-09 NOTE — Telephone Encounter (Signed)
The patient last received this medication at the requesting pharmacy on    09/09/15

## 2015-10-10 MED ORDER — AMLODIPINE BESYLATE 10 MG OR TABS
10.0000 mg | ORAL_TABLET | Freq: Every day | ORAL | Status: DC
Start: 2015-10-10 — End: 2015-10-16

## 2015-10-13 ENCOUNTER — Telehealth (INDEPENDENT_AMBULATORY_CARE_PROVIDER_SITE_OTHER): Payer: Self-pay | Admitting: Internal Medicine

## 2015-10-13 ENCOUNTER — Encounter (INDEPENDENT_AMBULATORY_CARE_PROVIDER_SITE_OTHER): Payer: PPO | Admitting: Internal Medicine

## 2015-10-13 NOTE — Telephone Encounter (Signed)
(  TEXTING IS AN OPTION FOR [facility] ONLY)  Is this a [place] clinic? Yes. What is the mobile number we can use to get a hold of you via text? [phone]      RETURN CALL: Detailed message on voicemail only      SUBJECT:  General Message     REASON FOR REQUEST: Patient requesting a call for any sooner appointment than 11/4 with an Internist provider only for his discharge follow up visit; preferably mid day appointment.    MESSAGE: n/a    Please assist, thank you.

## 2015-10-13 NOTE — Telephone Encounter (Signed)
[place] for [employer].

## 2015-10-13 NOTE — Telephone Encounter (Signed)
1st attempt. LVM [facility] if pt calls back please transfer call to ext [phone]. Thank you.

## 2015-10-15 NOTE — Telephone Encounter (Signed)
Apt rescheduled to 10/16/15. Closing TE

## 2015-10-16 ENCOUNTER — Other Ambulatory Visit: Payer: Self-pay | Admitting: Internal Medicine

## 2015-10-16 ENCOUNTER — Encounter (INDEPENDENT_AMBULATORY_CARE_PROVIDER_SITE_OTHER): Payer: PPO | Admitting: Internal Medicine

## 2015-10-16 ENCOUNTER — Ambulatory Visit (INDEPENDENT_AMBULATORY_CARE_PROVIDER_SITE_OTHER): Payer: PPO | Admitting: Internal Medicine

## 2015-10-16 ENCOUNTER — Encounter (INDEPENDENT_AMBULATORY_CARE_PROVIDER_SITE_OTHER): Payer: Self-pay | Admitting: Internal Medicine

## 2015-10-16 VITALS — BP 136/79 | HR 95 | Resp 12 | Wt 227.6 lb

## 2015-10-16 DIAGNOSIS — D62 Acute posthemorrhagic anemia: Secondary | ICD-10-CM

## 2015-10-16 DIAGNOSIS — M79669 Pain in unspecified lower leg: Secondary | ICD-10-CM

## 2015-10-16 DIAGNOSIS — I10 Essential (primary) hypertension: Secondary | ICD-10-CM

## 2015-10-16 DIAGNOSIS — E1121 Type 2 diabetes mellitus with diabetic nephropathy: Secondary | ICD-10-CM

## 2015-10-16 DIAGNOSIS — N179 Acute kidney failure, unspecified: Secondary | ICD-10-CM

## 2015-10-16 DIAGNOSIS — N139 Obstructive and reflux uropathy, unspecified: Secondary | ICD-10-CM

## 2015-10-16 DIAGNOSIS — R32 Unspecified urinary incontinence: Secondary | ICD-10-CM

## 2015-10-16 DIAGNOSIS — M7989 Other specified soft tissue disorders: Secondary | ICD-10-CM

## 2015-10-16 LAB — PR U/A AUTO DIPSTICK ONLY, ONSITE
Bilirubin, Urine: NEGATIVE
Glucose, Urine: NEGATIVE mg/dL
Ketones, URN: NEGATIVE mg/dL
Nitrite, URN: NEGATIVE
Specific Gravity, Urine: 1.025 (ref 1.005–1.030)
Urobilinogen, URN: 1 E.U./dL (ref 0.2–1.0)
pH, URN: 7 (ref 5.0–8.0)

## 2015-10-16 MED ORDER — OXYCODONE HCL 5 MG OR TABS
5.0000 mg | ORAL_TABLET | Freq: Four times a day (QID) | ORAL | Status: DC | PRN
Start: 2015-10-16 — End: 2015-10-24

## 2015-10-16 MED ORDER — ATORVASTATIN CALCIUM 40 MG OR TABS
40.0000 mg | ORAL_TABLET | Freq: Every day | ORAL | Status: DC
Start: 2015-10-16 — End: 2015-10-24

## 2015-10-16 MED ORDER — DME PRESCRIPTION
Status: DC
Start: 2015-10-16 — End: 2015-10-24

## 2015-10-16 NOTE — Progress Notes (Signed)
Roger [name] is a 54 year old male.   Chief Complaint   Patient presents with   . Hospital F/U   . 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   Creatinine was 13 on admission, down to 2 by discharge.  Had a cystoscopy.  He notes the urine comes out ok.    He has some incontinence that is not new. Uses pads in the day and depends at night.   He has had some painful foot and leg swelling.  This makes it hard for him to participate in physical therapy.  He would like some pain medication, oxycodone is fine.   His a1c in the hospital was 8.5.   His metformin was held.  Plan was for him to use insulin but he has had some trouble obtaining this so is not currently using any medication for it.       Past Medical History   Diagnosis Date   . Unspecified essential hypertension 1996     on 3 drugs, some side effects from medications.    . Family history of colon cancer      brother died in 4s of colon cancer    . Type II or unspecified type diabetes mellitus without mention of complication, not stated as uncontrolled ([place]) 2013   . History of erectile dysfunction        Past Surgical History   Procedure Laterality Date   . Colonoscopy stoma dx including collj spec spx  2003     [facility], normal   . Colonoscopy stoma dx including collj spec spx  2011   . Prostatectomy         Patient Active Problem List   Diagnosis   . Essential hypertension   . Hypercalcemia   . Nonspecific elevation of levels of transaminase or lactic acid dehydrogenase (LDH)   . Family history of malignant neoplasm of gastrointestinal tract   . Diabetes mellitus with nephropathy ([place])   . Anxiety   . Prostate cancer ([facility])   . Erectile dysfunction       Current Outpatient Prescriptions   Medication Sig Dispense Refill   . Alprostadil, Vasodilator, (CAVERJECT) 20 MCG IntraCAVernous  Recon Soln Inject 0.12m intracorporally, NTE 1/24h or 3 times a week. (see below for specs). 1 vial 12   . Ascorbic Acid 250 MG Oral Tab Take 250 mg by mouth.     . Blood Glucose Monitoring Suppl (BLOOD GLUCOSE MONITOR KIT) Does not apply Kit one meter for testing 1-3 times per day 1 Kit 11   . Cholecalciferol (VITAMIN D3) 400 UNITS Oral Cap Take 1 tablet by mouth.     .Mariane BaumgartenSodium 100 MG Oral Cap Take 100 mg by mouth.     . Ferrous Sulfate 325 (65 FE) MG Oral Tab Take 325 mg by mouth.     . HydrALAZINE HCl 10 MG Oral Tab Take 20 mg by mouth.     . Insulin Glargine 100 UNIT/ML Subcutaneous Solution Inject 20 Units under the skin.     . Metoprolol Succinate ER 25 MG Oral TABLET SR 24 HR Take 25 mg by mouth.     . Multiple Vitamins-Minerals (MULTIVITAMIN & MINERAL OR) 1 tab a day     . ONETOUCH DELICA LANCETS FINE Does not apply Misc Use to  check blood sugar up to 3 times daily 300 each 3   . ONETOUCH ULTRA BLUE In Vitro Strip USE TO CHECK BLOOD SUGAR UP TO THREE TIMES DAILY 300 strip 3   . OxyCODONE HCl 5 MG Oral Tab Take 5 mg by mouth.     . Simethicone 80 MG Oral Chew Tab Chew and swallow 80 mg by mouth.     . Thiamine HCl 100 MG Oral Tab Take 100 mg by mouth.       No current facility-administered medications for this visit.       Family History   Problem Relation Age of Onset   . Hypertension Mother      stroke   . Cancer Brother      colon cancer, died age 20.         Social History   Substance Use Topics   . Smoking status: Never Smoker    . Smokeless tobacco: Never Used   . Alcohol Use: 6.0 oz/week      Comment: 2 fifths of liquor a week - 12/08/12       Allergies:Atenolol    ROS:  Renal;see HPI  Endo:see HPI    Exam:  BP 136/79 mmHg  Pulse 95  Resp 12  Wt 227 lb 9.6 oz (103.239 kg)  SpO2 98%  Gen: healthy, alert, no distress  QP:RFFMBWG rate and rhythm, no murmur, rub, or gallop  LUNG:clear to auscultation  EXT:1-2+ edema in feet and legs, mild warmth   Exam otherwise  deferred.    IMPRESSION/PLAN:    (N17.9) Acute renal failure, unspecified acute renal failure type (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, obstructive  Plan: COMPREHENSIVE METABOLIC PANEL, U/A AUTO         DIPSTICK ONLY, ONSITE            (M79.669,  M79.89) Pain and swelling of lower leg, unspecified laterality - I may give him some lasix, but I'd like to see labs first; probably result of the renal failure, can't r/o gout given the mild warmth  Plan: URIC ACID, SERUM, OxyCODONE HCl 5 MG Oral Tab            (D62) Acute blood loss anemia - discussed he will probably need iron for about two months   Plan: Ferrous Sulfate 325 (65 FE) MG Oral Tab, CBC         (HEMOGRAM)            (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

## 2015-10-16 NOTE — Telephone Encounter (Signed)
He should not take lisniorpil at this time.   [facility] won't let me remove it from the orders, so I will route it back to you for removal and closure of the encounter

## 2015-10-16 NOTE — Progress Notes (Signed)
Reason for visit:  Pt. Here for f/u from [facility] on 09/29/15-10/04/15 for urinary obstruction.      Have you seen a specialist since your last visit: NO          No specialty comments available.    Health Maintenance   Topic Date Due   . Hepatitis C Screen  Jan 01, 1961   . HIV Screen  05/06/1961   . Diabetes Foot Exam  05/23/2014   . A1c  09/28/2014   . Diabetes Eye Exam  10/16/2014   . Tetanus Vaccine  11/11/2014   . Colonoscopy  03/11/2015   . Influenza Vaccine (1) 08/14/2015   . Cholesterol Test  12/04/2018       Future Appointments  Date Time Provider [place]   11/03/2015 9:45 AM Edilia Bo, MD [facility] [place]

## 2015-10-16 NOTE — Telephone Encounter (Signed)
Patient has an appointment with you 10/16/15 @ 11:30.  Please address patient's refill requests at the appt for the following medication(s): lisinopril

## 2015-10-16 NOTE — Patient Instructions (Addendum)
I'll probably send in a diuretic (furosemide) for you to take for a few days for the leg swelling, but I want to see your labs first.   I may need to send in potassium too.     Labs today.    Follow up next week.    Try adding a statin for heart attack and stroke prevention (recommended for all persons with diabetes between ages 73 and 24).

## 2015-10-17 ENCOUNTER — Telehealth (INDEPENDENT_AMBULATORY_CARE_PROVIDER_SITE_OTHER): Payer: Self-pay | Admitting: Internal Medicine

## 2015-10-17 ENCOUNTER — Encounter (INDEPENDENT_AMBULATORY_CARE_PROVIDER_SITE_OTHER): Payer: PPO | Admitting: Internal Medicine

## 2015-10-17 DIAGNOSIS — R609 Edema, unspecified: Secondary | ICD-10-CM

## 2015-10-17 LAB — CBC (HEMOGRAM)
Hematocrit: 29 % — ABNORMAL LOW (ref 38–50)
Hemoglobin: 8.7 g/dL — ABNORMAL LOW (ref 13.0–18.0)
MCH: 27.6 pg (ref 27.3–33.6)
MCHC: 30.3 g/dL — ABNORMAL LOW (ref 32.2–36.5)
MCV: 91 fL (ref 81–98)
Platelet Count: 377 10*3/uL (ref 150–400)
RBC: 3.15 10*6/uL — ABNORMAL LOW (ref 4.40–5.60)
RDW-CV: 21.9 % — ABNORMAL HIGH (ref 11.6–14.4)
WBC: 9.24 10*3/uL (ref 4.3–10.0)

## 2015-10-17 LAB — COMPREHENSIVE METABOLIC PANEL
ALT (GPT): 27 U/L (ref 10–48)
AST (GOT): 63 U/L — ABNORMAL HIGH (ref 9–38)
Albumin: 3.5 g/dL (ref 3.5–5.2)
Alkaline Phosphatase (Total): 116 U/L (ref 37–159)
Anion Gap: 12 (ref 4–12)
Bilirubin (Total): 0.4 mg/dL (ref 0.2–1.3)
Calcium: 9.3 mg/dL (ref 8.9–10.2)
Carbon Dioxide, Total: 27 meq/L (ref 22–32)
Chloride: 103 meq/L (ref 98–108)
Creatinine: 0.86 mg/dL (ref 0.51–1.18)
GFR, Calc, African American: >60 mL/min (ref >59)
GFR, Calc, European American: >60 mL/min (ref >59)
Glucose: 177 mg/dL — ABNORMAL HIGH (ref 62–125)
Potassium: 4.1 meq/L (ref 3.6–5.2)
Protein (Total): 6.7 g/dL (ref 6.0–8.2)
Sodium: 142 meq/L (ref 135–145)
Urea Nitrogen: 8 mg/dL (ref 8–21)

## 2015-10-17 LAB — URIC ACID, SERUM: Uric Acid: 6.2 mg/dL (ref 3.9–7.6)

## 2015-10-17 MED ORDER — FUROSEMIDE 20 MG OR TABS
20.0000 mg | ORAL_TABLET | Freq: Every morning | ORAL | Status: DC
Start: 2015-10-17 — End: 2015-10-24

## 2015-10-17 NOTE — Telephone Encounter (Signed)
Please let Roger [name] know his kidney function was fine now on his labs.    He is very anemic.     i've sent in furosemide for him to take once a day for three days for the swelling.     He should follow up next week as planned.

## 2015-10-20 NOTE — Telephone Encounter (Signed)
Patient informed of provider message below.  Has appt with PCP on 11/11

## 2015-10-23 ENCOUNTER — Telehealth (HOSPITAL_BASED_OUTPATIENT_CLINIC_OR_DEPARTMENT_OTHER): Payer: Self-pay | Admitting: Urology

## 2015-10-23 NOTE — Telephone Encounter (Signed)
Email response from [place]  I haven't seen him in quite some time. I didn't actually treat him since he decided to have his radiation at an outside hospital. Roderic Palau, have you been following him still? 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 Also, need to confirm that this is not anything related to recurrent disease. Does he have a recent PSA and imaging?   Ulice Dash    Routed to [place] and [facility]    [name], Therapist, sports, [employer]

## 2015-10-23 NOTE — Telephone Encounter (Signed)
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 The patient was treated in the OR for clot removal. He now states the clots returned on 11/07 with a dribbling urine stream. He was seen by Dr. Patsy [place], urologist in [place] who placed an in and out catheter for 350 cc urine this morning. He said the OR report noted significant, beefy redness in the bladder. At this time the patient has a dribbling urine stream.    B: The patient has a history of prostate cancer. 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     07/01/2014 Note by Almetta Lovely-    Roger [name] pre-radical prostectomy (RP) PSA was 12.55. He had a robotic assisted RP w/ R sided nerve sparing and pelvic lymph node dissection on May 2014. Pathology showed adenocarcinoma w/ bilateral involvement, tumor volume < 1cc, no LVSI or PNI,      Gleason 3+4=7, no ECE or SVI and surgical margins negative. Nodes were negative 0/4. His PSA in September 2014 was undetectable. His PSA has been rising, however, from 0.1 (12/2013) to 0.15 (03/2014) to 0.19 (06/13/14).   He seems inclined to be aggressive and was leaning toward proceeding with salvage RT and short course ADT.      A: The patient has concerns of bladder clots and decreased urination in the setting of radiation cystitis.    P: Routed to Renato Gails PA-C for advice and Drs Jodi Mourning and Corlis Leak.    [name], RN, BSN-BC

## 2015-10-23 NOTE — Telephone Encounter (Addendum)
The patient states he has been unable to urinate since the in and out catheter placement this morning. He admits he has not drank any fluid. The patient was advised to go to the ED if he is unable to urinate for 6-8 hours. He agreed.    He said he had a recent PSA. Records have been requested.    [name], RN, BSN-BC

## 2015-10-23 NOTE — Telephone Encounter (Signed)
Pt need a call back from RN, states he was at the urgent care a week ago due to internal bleeding.

## 2015-10-24 ENCOUNTER — Ambulatory Visit (INDEPENDENT_AMBULATORY_CARE_PROVIDER_SITE_OTHER): Payer: PPO | Admitting: Internal Medicine

## 2015-10-24 VITALS — BP 144/82 | HR 112 | Wt 236.0 lb

## 2015-10-24 DIAGNOSIS — I10 Essential (primary) hypertension: Secondary | ICD-10-CM

## 2015-10-24 DIAGNOSIS — F419 Anxiety disorder, unspecified: Secondary | ICD-10-CM

## 2015-10-24 DIAGNOSIS — E1121 Type 2 diabetes mellitus with diabetic nephropathy: Secondary | ICD-10-CM

## 2015-10-24 DIAGNOSIS — M25572 Pain in left ankle and joints of left foot: Secondary | ICD-10-CM

## 2015-10-24 DIAGNOSIS — C61 Malignant neoplasm of prostate: Secondary | ICD-10-CM

## 2015-10-24 DIAGNOSIS — M79669 Pain in unspecified lower leg: Secondary | ICD-10-CM

## 2015-10-24 DIAGNOSIS — R600 Localized edema: Secondary | ICD-10-CM

## 2015-10-24 DIAGNOSIS — R609 Edema, unspecified: Secondary | ICD-10-CM

## 2015-10-24 DIAGNOSIS — M7989 Other specified soft tissue disorders: Secondary | ICD-10-CM

## 2015-10-24 MED ORDER — FUROSEMIDE 20 MG OR TABS
20.0000 mg | ORAL_TABLET | Freq: Two times a day (BID) | ORAL | Status: DC
Start: 2015-10-24 — End: 2016-01-31

## 2015-10-24 MED ORDER — AMLODIPINE BESYLATE 10 MG OR TABS
10.0000 mg | ORAL_TABLET | Freq: Every day | ORAL | Status: AC
Start: 2015-10-24 — End: ?

## 2015-10-24 MED ORDER — OXYCODONE HCL 5 MG OR TABS
5.0000 mg | ORAL_TABLET | Freq: Four times a day (QID) | ORAL | Status: AC | PRN
Start: 2015-10-24 — End: ?

## 2015-10-24 MED ORDER — GLIPIZIDE 5 MG OR TABS
5.0000 mg | ORAL_TABLET | Freq: Every day | ORAL | Status: AC
Start: 2015-10-24 — End: ?

## 2015-10-24 MED ORDER — POTASSIUM CHLORIDE ER 20 MEQ OR TBCR
20.0000 meq | EXTENDED_RELEASE_TABLET | Freq: Two times a day (BID) | ORAL | Status: AC
Start: 2015-10-24 — End: ?

## 2015-10-24 NOTE — Patient Instructions (Signed)
Lower extremity edema  Patient is to start furosemide 20 mg twice a day, recommended the morning and at lunchtime.  He should take potassium with this.  This should help to decrease his volume in his lower extremities and will relieve pressure off of the recently healing left foot.  I will follow-up in 7-10 days with labs and evaluation of the lower extremity edema.    Hypertension  Patient is going to be starting furosemide.  He is currently on amlodipine daily.  This may be the source of his lower extremity swelling, but he also recently received lots of fluid.  I'm hopeful that he will have improvement in his lower extremity edema with diuretics alone.  If not, would switch his amlodipine (stay on it).    Patient was given hydralazine as an outpatient when he left the hospital, but he has not been taking it.  Continue to hold on this medication.    continue metoprolol.     Diabetes  Patient with recent renal failure, taken off of metformin due to renal failure.  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  He currently has normal renal function, but may have recurrent episodes of renal failure in which would not be helpful.  I would recommend that he take glipizide once a day for now and we can adjust this medication.    Foot pain  I suspect that patient's foot pain is primarily related to the swelling, which improved with diuretics.  He was given a small amount of oxycodone to use as needed for the foot pain.  He agrees not to drink alcohol when taking the oxycodone.  He will be following up with his surgeon and will probably eventually going to physical therapy again.  I think it is important for him to follow-up with surgery due to his increased pain in the foot.

## 2015-10-24 NOTE — Progress Notes (Signed)
CHIEF COMPLAINT:  Roger [name] is a 54 year old male here to discuss diabetes.      HISTORY OF PRESENT ILLNESS OR INTERVAL HISTORY:  Pt had bladder clots and did not urinate for 4 days due to clots.  He was sent to the ER and admitted.  He had 5 days of therapy including a surgery to cauterize.  He continues to have some bleeding, but does not have any retention.  This is being followed by his urologist.    He is having some leakage.  He thinks it is expected.      In June, pt broke his foot and ankle.  Screws and plate were placed.  He started PT, but then developed hematuria, so stopped PT.  Leg started swelling when he was in the hospital.  He was given lots of fluid in the hospital.  He has swelling in the legs, L>R, some numbness.      He was told to stop metformin in the summer due to lightheadedness.  He had a blood sugar of 600 before coming into the hospital.          Review of Systems   Constitutional: Positive for malaise/fatigue.   Cardiovascular: Negative.    Genitourinary: Positive for hematuria.   Musculoskeletal:        Foot pain   Psychiatric/Behavioral: Negative.        Physical Exam   Constitutional: He is oriented to person, place, and time and well-developed, well-nourished, and in no distress. No distress.   Cardiovascular: Regular rhythm and normal heart sounds.  Exam reveals no gallop and no friction rub.    No murmur heard.  Slightly tachy, not off of baseline for him   Pulmonary/Chest: Effort normal and breath sounds normal. No respiratory distress. He has no wheezes. He has no rales.   Abdominal: Soft.   Musculoskeletal:   Swelling in the LLE, tender to ROM   Neurological: He is alert and oriented to person, place, and time.   Skin: He is not diaphoretic.   Psychiatric: Memory, affect and judgment normal.       ASSESSMENT AND PLAN:     Lower extremity edema  Patient is to start furosemide 20 mg twice a day, recommended the morning and at lunchtime.  He should take potassium with this.  This  should help to decrease his volume in his lower extremities and will relieve pressure off of the recently healing left foot.  I will follow-up in 7-10 days with labs and evaluation of the lower extremity edema.    Hypertension  Patient is going to be starting furosemide.  He is currently on amlodipine daily.  This may be the source of his lower extremity swelling, but he also recently received lots of fluid.  I'm hopeful that he will have improvement in his lower extremity edema with diuretics alone.  If not, would switch his amlodipine (stay on it).    Patient was given hydralazine as an outpatient when he left the hospital, but he has not been taking it.  Continue to hold on this medication.    continue metoprolol.     Diabetes  Patient with recent renal failure, taken off of metformin due to renal failure.  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  He currently has normal renal function, but may have recurrent episodes of renal failure in which would not  be helpful.  I would recommend that he take glipizide once a day for now and we can adjust this medication.    Foot pain  I suspect that patient's foot pain is primarily related to the swelling, which improved with diuretics.  He was given a small amount of oxycodone to use as needed for the foot pain.  He agrees not to drink alcohol when taking the oxycodone.  He will be following up with his surgeon and will probably eventually going to physical therapy again.  I think it is important for him to follow-up with surgery due to his increased pain in the foot.        =======================    PROBLEM LIST:  Reviewed and updated in the electronic medical record under Problem List.    This includes pertinent past medical and surgical history.    Patient Active Problem List    Diagnosis Date Noted   . Prostate cancer ([place]) [C61] 05/16/2013   . Anxiety [F41.9]  04/29/2012   . 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 Family history of malignant neoplasm of gastrointestinal tract [Z80.0] 05/01/2002   . Hypercalcemia [E83.52] 06/11/2001   . Nonspecific elevation of levels of transaminase or lactic acid dehydrogenase (LDH) [R74.0] 06/11/2001   . Essential hypertension [I10] 06/02/2001   . Erectile dysfunction [N52.9] 10/08/2013       MEDICATIONS:  Current Outpatient Prescriptions   Medication Sig Dispense Refill   . Alprostadil, Vasodilator, (CAVERJECT) 20 MCG IntraCAVernous Recon Soln Inject 0.83m intracorporally, NTE 1/24h or 3 times a week. (see below for specs). 1 vial 12   . Ascorbic Acid 250 MG Oral Tab Take 250 mg by mouth.     . Atorvastatin Calcium 40 MG Oral Tab Take 1 tablet (40 mg) by mouth daily. To lower Cholesterol. 90 tablet 1   . Blood Glucose Monitoring Suppl (BLOOD GLUCOSE MONITOR KIT) Does not apply Kit one meter for testing 1-3 times per day 1 Kit 11   . Cholecalciferol (VITAMIN D3) 400 UNITS Oral Cap Take 1 tablet by mouth.     . Ferrous Sulfate 325 (65 FE) MG Oral Tab Take 325 mg by mouth.     . Furosemide 20 MG Oral Tab Take 1 tablet (20 mg) by mouth every morning. X 3 days 3 tablet 0   . HydrALAZINE HCl 10 MG Oral Tab Take 20 mg by mouth.     . Insulin Glargine 100 UNIT/ML Subcutaneous Solution Inject 20 Units under the skin.     . Metoprolol Succinate ER 25 MG Oral TABLET SR 24 HR Take 25 mg by mouth.     . Multiple Vitamins-Minerals (MULTIVITAMIN & MINERAL OR) 1 tab a day     . ONETOUCH DELICA LANCETS FINE Does not apply Misc Use to check blood sugar up to 3 times daily 300 each 3   . ONETOUCH ULTRA BLUE In Vitro Strip USE TO CHECK BLOOD SUGAR UP TO THREE TIMES DAILY 300 strip 3   . OxyCODONE HCl 5 MG Oral Tab Take 1 tablet (5 mg) by mouth every 6 hours as needed for pain. prior to physical therapy 5 tablet 0   . Thiamine HCl 100 MG Oral Tab Take 100 mg by mouth.     . Unclassified (DME PRESCRIPTION)  Supply: adult diapers, 30 per month  Diagnosis: (R32) Urinary incontinence, unspecified type  Prognosis: good  Length of need:99 30 each 11  No current facility-administered medications for this visit.       ALLERGIES:  Review of patient's allergies indicates:  Allergies   Allergen Reactions   . Atenolol      dizziness

## 2015-10-25 ENCOUNTER — Inpatient Hospital Stay (HOSPITAL_COMMUNITY): Payer: PPO | Admitting: Urology

## 2015-10-25 ENCOUNTER — Inpatient Hospital Stay
Admission: AD | Admit: 2015-10-25 | Discharge: 2015-10-29 | DRG: 920 | Disposition: A | Discharge status: Home / Self Care | Payer: PPO | Attending: Urology | Admitting: Urology

## 2015-10-25 DIAGNOSIS — Z800 Family history of malignant neoplasm of digestive organs: Secondary | ICD-10-CM

## 2015-10-25 DIAGNOSIS — N529 Male erectile dysfunction, unspecified: Secondary | ICD-10-CM | POA: Diagnosis present

## 2015-10-25 DIAGNOSIS — R319 Hematuria, unspecified: Secondary | ICD-10-CM

## 2015-10-25 DIAGNOSIS — N3091 Cystitis, unspecified with hematuria: Secondary | ICD-10-CM | POA: Diagnosis present

## 2015-10-25 DIAGNOSIS — N179 Acute kidney failure, unspecified: Secondary | ICD-10-CM | POA: Diagnosis present

## 2015-10-25 DIAGNOSIS — D62 Acute posthemorrhagic anemia: Secondary | ICD-10-CM | POA: Diagnosis present

## 2015-10-25 DIAGNOSIS — N138 Other obstructive and reflux uropathy: Secondary | ICD-10-CM | POA: Diagnosis present

## 2015-10-25 DIAGNOSIS — E119 Type 2 diabetes mellitus without complications: Secondary | ICD-10-CM | POA: Diagnosis present

## 2015-10-25 DIAGNOSIS — Z9079 Acquired absence of other genital organ(s): Secondary | ICD-10-CM

## 2015-10-25 DIAGNOSIS — N281 Cyst of kidney, acquired: Secondary | ICD-10-CM | POA: Diagnosis present

## 2015-10-25 DIAGNOSIS — Z923 Personal history of irradiation: Secondary | ICD-10-CM

## 2015-10-25 DIAGNOSIS — I10 Essential (primary) hypertension: Secondary | ICD-10-CM | POA: Diagnosis present

## 2015-10-25 DIAGNOSIS — Z8546 Personal history of malignant neoplasm of prostate: Secondary | ICD-10-CM

## 2015-10-25 DIAGNOSIS — N99820 Postprocedural hemorrhage and hematoma of a genitourinary system organ or structure following a genitourinary system procedure: Principal | ICD-10-CM | POA: Diagnosis present

## 2015-10-25 DIAGNOSIS — Z8249 Family history of ischemic heart disease and other diseases of the circulatory system: Secondary | ICD-10-CM

## 2015-10-26 ENCOUNTER — Encounter: Payer: Self-pay | Admitting: Hospitalist

## 2015-10-26 ENCOUNTER — Other Ambulatory Visit: Payer: Self-pay | Admitting: Student in an Organized Health Care Education/Training Program

## 2015-10-26 DIAGNOSIS — N179 Acute kidney failure, unspecified: Secondary | ICD-10-CM

## 2015-10-26 DIAGNOSIS — N281 Cyst of kidney, acquired: Secondary | ICD-10-CM

## 2015-10-26 DIAGNOSIS — N1330 Unspecified hydronephrosis: Secondary | ICD-10-CM

## 2015-10-26 DIAGNOSIS — N3289 Other specified disorders of bladder: Secondary | ICD-10-CM

## 2015-10-26 LAB — CALCIUM, (REFLEXIVE IONIZED)

## 2015-10-26 LAB — BASIC METABOLIC PANEL
Anion Gap: 19 — ABNORMAL HIGH (ref 4–12)
Anion Gap: 12 (ref 4–12)
Anion Gap: 13 — ABNORMAL HIGH (ref 4–12)
Calcium: 8.8 mg/dL — ABNORMAL LOW (ref 8.9–10.2)
Calcium: 8.5 mg/dL — ABNORMAL LOW (ref 8.9–10.2)
Calcium: 8.9 mg/dL (ref 8.9–10.2)
Carbon Dioxide, Total: 16 meq/L — ABNORMAL LOW (ref 22–32)
Carbon Dioxide, Total: 21 meq/L — ABNORMAL LOW (ref 22–32)
Carbon Dioxide, Total: 19 meq/L — ABNORMAL LOW (ref 22–32)
Chloride: 95 meq/L — ABNORMAL LOW (ref 98–108)
Chloride: 99 meq/L (ref 98–108)
Chloride: 100 meq/L (ref 98–108)
Creatinine: 8.08 mg/dL — ABNORMAL HIGH (ref 0.51–1.18)
Creatinine: 7.02 mg/dL — ABNORMAL HIGH (ref 0.51–1.18)
Creatinine: 6.59 mg/dL — ABNORMAL HIGH (ref 0.51–1.18)
GFR, Calc, African American: 8 mL/min — ABNORMAL LOW (ref >59)
GFR, Calc, African American: 10 mL/min — ABNORMAL LOW (ref >59)
GFR, Calc, African American: 11 mL/min — ABNORMAL LOW (ref >59)
GFR, Calc, European American: 7 mL/min — ABNORMAL LOW (ref >59)
GFR, Calc, European American: 8 mL/min — ABNORMAL LOW (ref >59)
GFR, Calc, European American: 9 mL/min — ABNORMAL LOW (ref >59)
Glucose: 100 mg/dL (ref 62–125)
Glucose: 108 mg/dL (ref 62–125)
Glucose: 91 mg/dL (ref 62–125)
Potassium: 5 meq/L (ref 3.6–5.2)
Potassium: 4.8 meq/L (ref 3.6–5.2)
Potassium: 4.5 meq/L (ref 3.6–5.2)
Sodium: 130 meq/L — ABNORMAL LOW (ref 135–145)
Sodium: 132 meq/L — ABNORMAL LOW (ref 135–145)
Sodium: 132 meq/L — ABNORMAL LOW (ref 135–145)
Urea Nitrogen: 56 mg/dL — ABNORMAL HIGH (ref 8–21)
Urea Nitrogen: 54 mg/dL — ABNORMAL HIGH (ref 8–21)
Urea Nitrogen: 53 mg/dL — ABNORMAL HIGH (ref 8–21)

## 2015-10-26 LAB — URINALYSIS COMPLETE, URN
Bacteria, URN: UNDETERMINED — AB
Bilirubin (Qual), URN: UNDETERMINED — AB
Comments For Microscopic, URN: UNDETERMINED — AB
Epith Cells_Renal/Trans,URN: UNDETERMINED /HPF — AB
Epith Cells_Squamous, URN: UNDETERMINED /LPF — AB
Glucose Qual, URN: NEGATIVE mg/dL
Ketones, URN: NEGATIVE mg/dL
Leukocyte Esterase, URN: POSITIVE — AB
Nitrite, URN: POSITIVE — AB
Protein (Alb Semiquant), URN: >300 mg/dL — AB
Specific Gravity, URN: 1.025 g/mL (ref 1.002–1.027)
WBC, URN: UNDETERMINED /HPF — AB

## 2015-10-26 LAB — CBC (HEMOGRAM)
Hematocrit: 21 % — ABNORMAL LOW (ref 38–50)
Hematocrit: 20 % — ABNORMAL LOW (ref 38–50)
Hemoglobin: 6.7 g/dL — ABNORMAL LOW (ref 13.0–18.0)
Hemoglobin: 6.3 g/dL — ABNORMAL LOW (ref 13.0–18.0)
MCH: 29.1 pg (ref 27.3–33.6)
MCH: 28.8 pg (ref 27.3–33.6)
MCHC: 32.7 g/dL (ref 32.2–36.5)
MCHC: 32.1 g/dL — ABNORMAL LOW (ref 32.2–36.5)
MCV: 89 fL (ref 81–98)
MCV: 90 fL (ref 81–98)
Platelet Count: 226 10*3/uL (ref 150–400)
Platelet Count: 248 10*3/uL (ref 150–400)
RBC: 2.3 10*6/uL — ABNORMAL LOW (ref 4.40–5.60)
RBC: 2.19 10*6/uL — ABNORMAL LOW (ref 4.40–5.60)
RDW-CV: 21.6 % — ABNORMAL HIGH (ref 11.6–14.4)
RDW-CV: 21.6 % — ABNORMAL HIGH (ref 11.6–14.4)
WBC: 10.79 10*3/uL — ABNORMAL HIGH (ref 4.3–10.0)
WBC: 10.06 10*3/uL — ABNORMAL HIGH (ref 4.3–10.0)

## 2015-10-26 LAB — MAGNESIUM: Magnesium: 1.6 mg/dL — ABNORMAL LOW (ref 1.8–2.4)

## 2015-10-26 LAB — CBC, DIFF
% Basophils: 1 %
% Eosinophils: 0 %
% Immature Granulocytes: 0 %
% Lymphocytes: 17 %
% Monocytes: 12 %
% Neutrophils: 70 %
% Nucleated RBC: 0 %
Absolute Eosinophil Count: 0 10*3/uL (ref 0.00–0.50)
Absolute Lymphocyte Count: 2.66 10*3/uL (ref 1.00–4.80)
Basophils: 0.16 10*3/uL (ref 0.00–0.20)
Hematocrit: 22 % — ABNORMAL LOW (ref 38–50)
Hemoglobin: 7.2 g/dL — ABNORMAL LOW (ref 13.0–18.0)
Immature Granulocytes: 0 10*3/uL (ref 0.00–0.05)
MCH: 28.9 pg (ref 27.3–33.6)
MCHC: 32.1 g/dL — ABNORMAL LOW (ref 32.2–36.5)
MCV: 90 fL (ref 81–98)
Monocytes: 1.88 10*3/uL — ABNORMAL HIGH (ref 0.00–0.80)
Neutrophils: 10.95 10*3/uL — ABNORMAL HIGH (ref 1.80–7.00)
Nucleated RBC: 0 10*3/uL
Platelet Count: 272 10*3/uL (ref 150–400)
RBC: 2.49 10*6/uL — ABNORMAL LOW (ref 4.40–5.60)
RDW-CV: 22 % — ABNORMAL HIGH (ref 11.6–14.4)
WBC: 15.64 10*3/uL — ABNORMAL HIGH (ref 4.3–10.0)

## 2015-10-26 LAB — PROTHROMBIN & PTT
Partial Thromboplastin Time: 38 s — ABNORMAL HIGH (ref 22–35)
Prothrombin INR: 1.3 (ref 0.8–1.3)
Prothrombin Time Patient: 15.7 s — ABNORMAL HIGH (ref 10.7–15.6)

## 2015-10-26 LAB — PHOSPHATE: Phosphate: 6.9 mg/dL — ABNORMAL HIGH (ref 2.5–4.5)

## 2015-10-26 LAB — SODIUM, URINE
Sodium, URN: 84 meq/L
Sodium/24Hr, URN: UNDETERMINED meq/(24.h) (ref 40–220)

## 2015-10-26 LAB — GLUCOSE POC, ~~LOC~~
Glucose (POC): 114 mg/dL (ref 62–125)
Glucose (POC): 101 mg/dL (ref 62–125)
Glucose (POC): 120 mg/dL (ref 62–125)
Glucose (POC): 103 mg/dL (ref 62–125)

## 2015-10-26 LAB — CREATININE, URINE
Creatinine/24H, Urine: UNDETERMINED mg/(24.h) (ref 1000–2000)
Creatinine/Unit, Urine: 47 mg/dL

## 2015-10-26 LAB — CHLORIDE, URINE
Chloride, URN: 80 meq/L
Chloride/24Hr, URN: UNDETERMINED meq/(24.h) (ref 110–250)

## 2015-10-26 LAB — BLOOD TYPE CONFIRMATION: ABO/Rh: B POS

## 2015-10-27 ENCOUNTER — Telehealth (INDEPENDENT_AMBULATORY_CARE_PROVIDER_SITE_OTHER): Payer: Self-pay

## 2015-10-27 LAB — BASIC METABOLIC PANEL
Anion Gap: 10 (ref 4–12)
Calcium: 9.2 mg/dL (ref 8.9–10.2)
Carbon Dioxide, Total: 21 meq/L — ABNORMAL LOW (ref 22–32)
Chloride: 103 meq/L (ref 98–108)
Creatinine: 5.14 mg/dL — ABNORMAL HIGH (ref 0.51–1.18)
GFR, Calc, African American: 14 mL/min — ABNORMAL LOW (ref >59)
GFR, Calc, European American: 12 mL/min — ABNORMAL LOW (ref >59)
Glucose: 111 mg/dL (ref 62–125)
Potassium: 4.2 meq/L (ref 3.6–5.2)
Sodium: 134 meq/L — ABNORMAL LOW (ref 135–145)
Urea Nitrogen: 48 mg/dL — ABNORMAL HIGH (ref 8–21)

## 2015-10-27 LAB — TYPE AND SCREEN
ABO/Rh: B POS
Antibody Screen: NEGATIVE
Units Ordered: 5

## 2015-10-27 LAB — CBC (HEMOGRAM)
Hematocrit: 19 % — CL (ref 38–50)
Hemoglobin: 6.5 g/dL — ABNORMAL LOW (ref 13.0–18.0)
MCH: 29.8 pg (ref 27.3–33.6)
MCHC: 33.9 g/dL (ref 32.2–36.5)
MCV: 88 fL (ref 81–98)
Platelet Count: 253 10*3/uL (ref 150–400)
RBC: 2.18 10*6/uL — ABNORMAL LOW (ref 4.40–5.60)
RDW-CV: 19.4 % — ABNORMAL HIGH (ref 11.6–14.4)
WBC: 8.87 10*3/uL (ref 4.3–10.0)

## 2015-10-27 LAB — GLUCOSE POC, ~~LOC~~
Glucose (POC): 115 mg/dL (ref 62–125)
Glucose (POC): 137 mg/dL — ABNORMAL HIGH (ref 62–125)
Glucose (POC): 159 mg/dL — ABNORMAL HIGH (ref 62–125)
Glucose (POC): 138 mg/dL — ABNORMAL HIGH (ref 62–125)
Glucose (POC): 183 mg/dL — ABNORMAL HIGH (ref 62–125)

## 2015-10-27 LAB — HEMATOCRIT
Hematocrit: 21 % — ABNORMAL LOW (ref 38–50)
Hematocrit: 23 % — ABNORMAL LOW (ref 38–50)

## 2015-10-27 LAB — PSA, DIAGNOSTIC/MONITORING: PSA, Diagnostic/Monitoring: <0.03 ng/mL (ref 0.00–4.00)

## 2015-10-27 LAB — MAGNESIUM: Magnesium: 1.9 mg/dL (ref 1.8–2.4)

## 2015-10-27 LAB — PHOSPHATE: Phosphate: 7.2 mg/dL — ABNORMAL HIGH (ref 2.5–4.5)

## 2015-10-27 NOTE — Telephone Encounter (Signed)
Inpatient [place]    Roger [name] is a 54 year old gentleman who presents with acute kidney injury and clot retention, secondary to likely hemorrhagic cystitis after a prior robot-assisted prostatectomy and subsequent XRT with ADT for Gleason 3+4 adenocarcinoma of the prostate.

## 2015-10-28 DIAGNOSIS — E8770 Fluid overload, unspecified: Secondary | ICD-10-CM

## 2015-10-28 LAB — CBC (HEMOGRAM)
Hematocrit: 21 % — ABNORMAL LOW (ref 38–50)
Hemoglobin: 6.9 g/dL — ABNORMAL LOW (ref 13.0–18.0)
MCH: 28.9 pg (ref 27.3–33.6)
MCHC: 32.5 g/dL (ref 32.2–36.5)
MCV: 89 fL (ref 81–98)
Platelet Count: 294 10*3/uL (ref 150–400)
RBC: 2.39 10*6/uL — ABNORMAL LOW (ref 4.40–5.60)
RDW-CV: 19.4 % — ABNORMAL HIGH (ref 11.6–14.4)
WBC: 7.73 10*3/uL (ref 4.3–10.0)

## 2015-10-28 LAB — BASIC METABOLIC PANEL
Anion Gap: 11 (ref 4–12)
Calcium: 9 mg/dL (ref 8.9–10.2)
Carbon Dioxide, Total: 25 meq/L (ref 22–32)
Chloride: 103 meq/L (ref 98–108)
Creatinine: 2.74 mg/dL — ABNORMAL HIGH (ref 0.51–1.18)
GFR, Calc, African American: 29 mL/min — ABNORMAL LOW (ref >59)
GFR, Calc, European American: 24 mL/min — ABNORMAL LOW (ref >59)
Glucose: 155 mg/dL — ABNORMAL HIGH (ref 62–125)
Potassium: 3.8 meq/L (ref 3.6–5.2)
Sodium: 139 meq/L (ref 135–145)
Urea Nitrogen: 30 mg/dL — ABNORMAL HIGH (ref 8–21)

## 2015-10-28 LAB — PHOSPHATE: Phosphate: 5.2 mg/dL — ABNORMAL HIGH (ref 2.5–4.5)

## 2015-10-28 LAB — GLUCOSE POC, ~~LOC~~
Glucose (POC): 129 mg/dL — ABNORMAL HIGH (ref 62–125)
Glucose (POC): 151 mg/dL — ABNORMAL HIGH (ref 62–125)
Glucose (POC): 154 mg/dL — ABNORMAL HIGH (ref 62–125)
Glucose (POC): 150 mg/dL — ABNORMAL HIGH (ref 62–125)

## 2015-10-28 LAB — URINE C/S: Colony Count: 100

## 2015-10-28 LAB — MAGNESIUM: Magnesium: 1.4 mg/dL — ABNORMAL LOW (ref 1.8–2.4)

## 2015-10-28 NOTE — Telephone Encounter (Signed)
inpatient

## 2015-10-29 LAB — CBC (HEMOGRAM)
Hematocrit: 22 % — ABNORMAL LOW (ref 38–50)
Hemoglobin: 7.4 g/dL — ABNORMAL LOW (ref 13.0–18.0)
MCH: 29.5 pg (ref 27.3–33.6)
MCHC: 33.6 g/dL (ref 32.2–36.5)
MCV: 88 fL (ref 81–98)
Platelet Count: 358 10*3/uL (ref 150–400)
RBC: 2.51 10*6/uL — ABNORMAL LOW (ref 4.40–5.60)
RDW-CV: 18.7 % — ABNORMAL HIGH (ref 11.6–14.4)
WBC: 7.84 10*3/uL (ref 4.3–10.0)

## 2015-10-29 LAB — BASIC METABOLIC PANEL
Anion Gap: 10 (ref 4–12)
Calcium: 9.1 mg/dL (ref 8.9–10.2)
Carbon Dioxide, Total: 25 meq/L (ref 22–32)
Chloride: 107 meq/L (ref 98–108)
Creatinine: 1.8 mg/dL — ABNORMAL HIGH (ref 0.51–1.18)
GFR, Calc, African American: 48 mL/min — ABNORMAL LOW (ref >59)
GFR, Calc, European American: 39 mL/min — ABNORMAL LOW (ref >59)
Glucose: 103 mg/dL (ref 62–125)
Potassium: 3.8 meq/L (ref 3.6–5.2)
Sodium: 142 meq/L (ref 135–145)
Urea Nitrogen: 19 mg/dL (ref 8–21)

## 2015-10-29 LAB — GLUCOSE POC, ~~LOC~~
Glucose (POC): 103 mg/dL (ref 62–125)
Glucose (POC): 113 mg/dL (ref 62–125)
Glucose (POC): 140 mg/dL — ABNORMAL HIGH (ref 62–125)

## 2015-10-29 LAB — PHOSPHATE: Phosphate: 4.3 mg/dL (ref 2.5–4.5)

## 2015-10-29 LAB — MAGNESIUM: Magnesium: 1.3 mg/dL — ABNORMAL LOW (ref 1.8–2.4)

## 2015-10-29 NOTE — Telephone Encounter (Signed)
inpatient

## 2015-10-30 NOTE — Telephone Encounter (Signed)
[place] was discharged 11/16    REASON FOR ADMISSION:   54 yo male with history of prostate cancer s/p RALP and radiation who now presents with gross hematuria, clot retention. In the ED, a 22 french 3 way foley catheter was placed and continuous bladder irrigation was started.   HOSPITAL COURSE:   Patient was admitted from the ED with gross hematuria, clot retention, and acute kidney injury. In the ED, a 22 french 3 way foley catheter was placed and continuous bladder irrigation was started. Patient continued on continuous badder irrigation with intermittent manual irrigation over the subsequent two days. His urine gradually became clearer, and clots were irrigated out daily. On day 4 of his hospitalization, his urine looked clear and the decision was made to give him a voiding trial. CBI was stopped and his foley catheter was removed. He was able to urinate, however passed small clots which decreased in quantity over the following hours. He continued to urinate without difficulty and he was determined fit for discharge. Nephrology was consulted throughout his hospitalization for management of acute renal failure, which improved significantly through his hospital course. Patient was discharged home without any issues.     Notes indicate he's following up with urology on 11/21.    Dr Hilda Blades - do you want to see patient for follow up too?

## 2015-10-31 NOTE — Telephone Encounter (Signed)
OK for regular f/u next month.

## 2015-11-03 ENCOUNTER — Encounter (HOSPITAL_BASED_OUTPATIENT_CLINIC_OR_DEPARTMENT_OTHER): Payer: Self-pay | Admitting: Urology

## 2015-11-03 ENCOUNTER — Encounter (INDEPENDENT_AMBULATORY_CARE_PROVIDER_SITE_OTHER): Payer: PPO | Admitting: Internal Medicine

## 2015-11-03 ENCOUNTER — Ambulatory Visit: Payer: PPO | Attending: Urology | Admitting: Urology

## 2015-11-03 VITALS — BP 132/80 | HR 110 | Ht 68.0 in | Wt 226.0 lb

## 2015-11-03 DIAGNOSIS — R319 Hematuria, unspecified: Secondary | ICD-10-CM | POA: Insufficient documentation

## 2015-11-03 DIAGNOSIS — C61 Malignant neoplasm of prostate: Secondary | ICD-10-CM | POA: Insufficient documentation

## 2015-11-03 NOTE — Progress Notes (Signed)
Urology Follow-up     SUBJECTIVE:    Chief Complaint   Patient presents with   . Hematuria   . Prostate Cancer     History of Present Illness:  Roger [name] is a 54 year old male who returns for prostate cancer.  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   Margins negative.   Developed biochemical recurrence and underwent salvage radiation therapy and finished in February 2016.  He has radiation close to home.    In June he fell down and broke his leg and at the time of surgery had a catheter placed resulting in significant hematuria and clots apparently.  He's had some intermittent hematuria since that time.  More recently he went into urinary clot retention and renal failure and had a clot evacuation at an outside hospital.  Reportedly no bladder tumors at that time.  He again went into clot retention within a couple weeks and came to [facility].  His bladder was hand irrigated and he was managed with CBI which cleared and then passed a voiding trial.  He currently is doing very well without return of hematuria over last 1-2 weeks.  Of note he has a undetectable PSA.        Review of Systems  Const: Neg for fever, chills    Past Medical Hx:    Past Medical History   Diagnosis Date   . Unspecified essential hypertension 1996     on 3 drugs, some side effects from medications.    . Family history of colon cancer      brother died in 49s of colon cancer    . Type II or unspecified type diabetes mellitus without mention of complication, not stated as uncontrolled ([place]) 2013   . History of erectile dysfunction        Past Surgical Hx:    Past Surgical History   Procedure Laterality Date   . Colonoscopy stoma dx including collj spec spx  2003     [facility], normal   . Colonoscopy stoma dx including collj spec spx  2011   . Prostatectomy         Active Meds:    Outpatient Prescriptions Marked as Taking for the 11/03/15 encounter (Office Visit) with Edilia Bo, MD   Medication Sig Dispense  Refill   . AmLODIPine Besylate 10 MG Oral Tab Take 1 tablet (10 mg) by mouth daily. For blood pressure 90 tablet 3   . Ascorbic Acid 250 MG Oral Tab Take 250 mg by mouth.     . Furosemide 20 MG Oral Tab Take 1 tablet (20 mg) by mouth 2 times a day. 60 tablet 0   . GlipiZIDE 5 MG Oral Tab Take 1 tablet (5 mg) by mouth daily. For diabetes. 30 tablet 3   . OxyCODONE HCl 5 MG Oral Tab Take 1 tablet (5 mg) by mouth every 6 hours as needed for pain. prior to physical therapy 20 tablet 0   . Potassium Chloride ER 20 MEQ Oral Tab CR Take 1 tablet (20 mEq) by mouth 2 times a day. 60 tablet 0   . Thiamine HCl 100 MG Oral Tab Take 100 mg by mouth.         Allergies:    Allergies as of 11/03/2015 Elta Guadeloupe as Reviewed 11/03/2015   Allergen Reaction Noted   . Atenolol  12/19/2001       OBJECTIVE:  Physical Exam:  BP 132/80 mmHg  Pulse 110  Ht 5\' 8"  (1.727 m)  Wt 226 lb (102.513 kg)  BMI 34.37 kg/m2  Constitutional: WDWN, Pleasant and appropriate affect and No acute distress  Extremities: No peripheral edema        IMPRESSION AND PLAN     1. 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  Prostatectomy May 2014.  Biochemical recurrence.  Salvage radiation ending February 2016.  PSA undetectable.  2. Hematuria with recent clot retention and renal failure.  We'll plan to perform an office cystoscopy in the next 6-8 weeks.  He may need to be referred for hyperbaric oxygen treatment if thought to be radiation cystitis.  3. Urinary continence.  He was completely continent after surgery but with the recent events he is having some leakage now again.  4. Diabetes.  5. Hypertension.  6. Obesity.  BMI 34.  7. Multiple bilateral renal cyst.      Recommend a follow up creatinine over next several weeks which he said he could have done through his PCP.

## 2015-11-06 ENCOUNTER — Inpatient Hospital Stay (HOSPITAL_COMMUNITY): Payer: PPO | Admitting: Urology

## 2015-11-06 ENCOUNTER — Inpatient Hospital Stay
Admission: AD | Admit: 2015-11-06 | Discharge: 2015-11-09 | DRG: 669 | Disposition: A | Discharge status: Home / Self Care | Payer: PPO | Attending: Urology | Admitting: Urology

## 2015-11-06 DIAGNOSIS — N3041 Irradiation cystitis with hematuria: Principal | ICD-10-CM | POA: Diagnosis present

## 2015-11-06 DIAGNOSIS — D62 Acute posthemorrhagic anemia: Secondary | ICD-10-CM | POA: Diagnosis present

## 2015-11-06 DIAGNOSIS — Z8546 Personal history of malignant neoplasm of prostate: Secondary | ICD-10-CM

## 2015-11-06 DIAGNOSIS — R339 Retention of urine, unspecified: Secondary | ICD-10-CM

## 2015-11-06 DIAGNOSIS — Z923 Personal history of irradiation: Secondary | ICD-10-CM

## 2015-11-06 DIAGNOSIS — Y842 Radiological procedure and radiotherapy as the cause of abnormal reaction of the patient, or of later complication, without mention of misadventure at the time of the procedure: Secondary | ICD-10-CM | POA: Diagnosis present

## 2015-11-06 DIAGNOSIS — I10 Essential (primary) hypertension: Secondary | ICD-10-CM | POA: Diagnosis present

## 2015-11-06 DIAGNOSIS — E119 Type 2 diabetes mellitus without complications: Secondary | ICD-10-CM | POA: Diagnosis present

## 2015-11-06 DIAGNOSIS — N3040 Irradiation cystitis without hematuria: Secondary | ICD-10-CM

## 2015-11-06 DIAGNOSIS — Z9079 Acquired absence of other genital organ(s): Secondary | ICD-10-CM

## 2015-11-06 DIAGNOSIS — R338 Other retention of urine: Secondary | ICD-10-CM | POA: Diagnosis present

## 2015-11-06 DIAGNOSIS — R319 Hematuria, unspecified: Secondary | ICD-10-CM

## 2015-11-06 DIAGNOSIS — N179 Acute kidney failure, unspecified: Secondary | ICD-10-CM | POA: Diagnosis present

## 2015-11-06 DIAGNOSIS — E872 Acidosis: Secondary | ICD-10-CM | POA: Diagnosis present

## 2015-11-06 DIAGNOSIS — D72829 Elevated white blood cell count, unspecified: Secondary | ICD-10-CM | POA: Diagnosis present

## 2015-11-06 DIAGNOSIS — N3289 Other specified disorders of bladder: Secondary | ICD-10-CM

## 2015-11-06 LAB — PROTHROMBIN & PTT
Partial Thromboplastin Time: 36 s — ABNORMAL HIGH (ref 22–35)
Prothrombin INR: 1.2 (ref 0.8–1.3)
Prothrombin Time Patient: 14.9 s (ref 10.7–15.6)

## 2015-11-06 LAB — URINALYSIS WITH REFLEX CULTURE
Bilirubin (Qual), URN: NEGATIVE
Epith Cells_Renal/Trans,URN: NEGATIVE /HPF
Epith Cells_Squamous, URN: NEGATIVE /LPF
Glucose Qual, URN: NEGATIVE mg/dL
Ketones, URN: NEGATIVE mg/dL
Leukocyte Esterase, URN: POSITIVE — AB
Nitrite, URN: NEGATIVE
Specific Gravity, URN: 1.006 g/mL (ref 1.002–1.027)
pH, URN: 6 (ref 5.0–8.0)

## 2015-11-06 LAB — BASIC METABOLIC PANEL
Anion Gap: 13 — ABNORMAL HIGH (ref 4–12)
Calcium: 8.7 mg/dL — ABNORMAL LOW (ref 8.9–10.2)
Carbon Dioxide, Total: 18 meq/L — ABNORMAL LOW (ref 22–32)
Chloride: 100 meq/L (ref 98–108)
Creatinine: 4.88 mg/dL — ABNORMAL HIGH (ref 0.51–1.18)
GFR, Calc, African American: 15 mL/min/{1.73_m2}
GFR, Calc, European American: 12 mL/min/{1.73_m2}
Glucose: 60 mg/dL — ABNORMAL LOW (ref 62–125)
Potassium: 5 meq/L (ref 3.6–5.2)
Sodium: 131 meq/L — ABNORMAL LOW (ref 135–145)
Urea Nitrogen: 21 mg/dL (ref 8–21)

## 2015-11-06 LAB — CBC (HEMOGRAM)
Hematocrit: 20 % — ABNORMAL LOW (ref 38–50)
Hemoglobin: 6.8 g/dL — ABNORMAL LOW (ref 13.0–18.0)
MCH: 29.7 pg (ref 27.3–33.6)
MCHC: 33.8 g/dL (ref 32.2–36.5)
MCV: 88 fL (ref 81–98)
Platelet Count: 601 10*3/uL — ABNORMAL HIGH (ref 150–400)
RBC: 2.29 10*6/uL — ABNORMAL LOW (ref 4.40–5.60)
RDW-CV: 18.7 % — ABNORMAL HIGH (ref 11.6–14.4)
WBC: 16.75 10*3/uL — ABNORMAL HIGH (ref 4.3–10.0)

## 2015-11-06 LAB — REFLEX CULTURE FOR UA

## 2015-11-06 LAB — GLUCOSE POC, ~~LOC~~
Glucose (POC): 62 mg/dL (ref 62–125)
Glucose (POC): 76 mg/dL (ref 62–125)

## 2015-11-07 DIAGNOSIS — D649 Anemia, unspecified: Secondary | ICD-10-CM

## 2015-11-07 DIAGNOSIS — R339 Retention of urine, unspecified: Secondary | ICD-10-CM

## 2015-11-07 LAB — BASIC METABOLIC PANEL
Anion Gap: 10 (ref 4–12)
Calcium: 8.8 mg/dL — ABNORMAL LOW (ref 8.9–10.2)
Carbon Dioxide, Total: 22 meq/L (ref 22–32)
Chloride: 101 meq/L (ref 98–108)
Creatinine: 3.64 mg/dL — ABNORMAL HIGH (ref 0.51–1.18)
GFR, Calc, African American: 21 mL/min/{1.73_m2}
GFR, Calc, European American: 17 mL/min/{1.73_m2}
Glucose: 54 mg/dL — ABNORMAL LOW (ref 62–125)
Potassium: 4.9 meq/L (ref 3.6–5.2)
Sodium: 133 meq/L — ABNORMAL LOW (ref 135–145)
Urea Nitrogen: 24 mg/dL — ABNORMAL HIGH (ref 8–21)

## 2015-11-07 LAB — CBC (HEMOGRAM)
Hematocrit: 19 % — CL (ref 38–50)
Hemoglobin: 6.4 g/dL — ABNORMAL LOW (ref 13.0–18.0)
MCH: 29.2 pg (ref 27.3–33.6)
MCHC: 33.5 g/dL (ref 32.2–36.5)
MCV: 87 fL (ref 81–98)
Platelet Count: 529 10*3/uL — ABNORMAL HIGH (ref 150–400)
RBC: 2.19 10*6/uL — ABNORMAL LOW (ref 4.40–5.60)
RDW-CV: 19 % — ABNORMAL HIGH (ref 11.6–14.4)
WBC: 10.21 10*3/uL — ABNORMAL HIGH (ref 4.3–10.0)

## 2015-11-07 LAB — PHOSPHATE: Phosphate: 6.2 mg/dL — ABNORMAL HIGH (ref 2.5–4.5)

## 2015-11-07 LAB — GLUCOSE POC, ~~LOC~~
Glucose (POC): 78 mg/dL (ref 62–125)
Glucose (POC): 98 mg/dL (ref 62–125)
Glucose (POC): 105 mg/dL (ref 62–125)
Glucose (POC): 154 mg/dL — ABNORMAL HIGH (ref 62–125)

## 2015-11-07 LAB — MAGNESIUM: Magnesium: 1.4 mg/dL — ABNORMAL LOW (ref 1.8–2.4)

## 2015-11-07 LAB — HEMATOCRIT: Hematocrit: 25 % — ABNORMAL LOW (ref 38–50)

## 2015-11-07 LAB — TYPE AND SCREEN
ABO/Rh: B POS
Antibody Screen: NEGATIVE
Units Ordered: 3

## 2015-11-08 DIAGNOSIS — R319 Hematuria, unspecified: Secondary | ICD-10-CM

## 2015-11-08 LAB — BASIC METABOLIC PANEL
Anion Gap: 10 (ref 4–12)
Calcium: 9.1 mg/dL (ref 8.9–10.2)
Carbon Dioxide, Total: 24 meq/L (ref 22–32)
Chloride: 101 meq/L (ref 98–108)
Creatinine: 2.41 mg/dL — ABNORMAL HIGH (ref 0.51–1.18)
GFR, Calc, African American: 34 mL/min/{1.73_m2}
GFR, Calc, European American: 28 mL/min/{1.73_m2}
Glucose: 79 mg/dL (ref 62–125)
Potassium: 4.5 meq/L (ref 3.6–5.2)
Sodium: 135 meq/L (ref 135–145)
Urea Nitrogen: 21 mg/dL (ref 8–21)

## 2015-11-08 LAB — CBC (HEMOGRAM)
Hematocrit: 25 % — ABNORMAL LOW (ref 38–50)
Hemoglobin: 8.7 g/dL — ABNORMAL LOW (ref 13.0–18.0)
MCH: 29.2 pg (ref 27.3–33.6)
MCHC: 34.3 g/dL (ref 32.2–36.5)
MCV: 85 fL (ref 81–98)
Platelet Count: 490 10*3/uL — ABNORMAL HIGH (ref 150–400)
RBC: 2.98 10*6/uL — ABNORMAL LOW (ref 4.40–5.60)
RDW-CV: 19.7 % — ABNORMAL HIGH (ref 11.6–14.4)
WBC: 7.9 10*3/uL (ref 4.3–10.0)

## 2015-11-08 LAB — URINE C/S
Colony Count: <100
Culture: NO GROWTH

## 2015-11-08 LAB — GLUCOSE POC, ~~LOC~~
Glucose (POC): 84 mg/dL (ref 62–125)
Glucose (POC): 119 mg/dL (ref 62–125)
Glucose (POC): 104 mg/dL (ref 62–125)
Glucose (POC): 116 mg/dL (ref 62–125)

## 2015-11-08 LAB — MAGNESIUM: Magnesium: 1.3 mg/dL — ABNORMAL LOW (ref 1.8–2.4)

## 2015-11-08 LAB — PHOSPHATE: Phosphate: 5.1 mg/dL — ABNORMAL HIGH (ref 2.5–4.5)

## 2015-11-09 LAB — BASIC METABOLIC PANEL
Anion Gap: 9 (ref 4–12)
Calcium: 9.1 mg/dL (ref 8.9–10.2)
Carbon Dioxide, Total: 23 meq/L (ref 22–32)
Chloride: 105 meq/L (ref 98–108)
Creatinine: 1.68 mg/dL — ABNORMAL HIGH (ref 0.51–1.18)
GFR, Calc, African American: 52 mL/min/{1.73_m2}
GFR, Calc, European American: 43 mL/min/{1.73_m2}
Glucose: 99 mg/dL (ref 62–125)
Potassium: 4.2 meq/L (ref 3.6–5.2)
Sodium: 137 meq/L (ref 135–145)
Urea Nitrogen: 13 mg/dL (ref 8–21)

## 2015-11-09 LAB — PHOSPHATE: Phosphate: 4.2 mg/dL (ref 2.5–4.5)

## 2015-11-09 LAB — CBC (HEMOGRAM)
Hematocrit: 25 % — ABNORMAL LOW (ref 38–50)
Hemoglobin: 8.3 g/dL — ABNORMAL LOW (ref 13.0–18.0)
MCH: 29 pg (ref 27.3–33.6)
MCHC: 33.6 g/dL (ref 32.2–36.5)
MCV: 86 fL (ref 81–98)
Platelet Count: 402 10*3/uL — ABNORMAL HIGH (ref 150–400)
RBC: 2.86 10*6/uL — ABNORMAL LOW (ref 4.40–5.60)
RDW-CV: 19.2 % — ABNORMAL HIGH (ref 11.6–14.4)
WBC: 6.41 10*3/uL (ref 4.3–10.0)

## 2015-11-09 LAB — GLUCOSE POC, ~~LOC~~: Glucose (POC): 98 mg/dL (ref 62–125)

## 2015-11-09 LAB — MAGNESIUM: Magnesium: 1.3 mg/dL — ABNORMAL LOW (ref 1.8–2.4)

## 2015-11-10 ENCOUNTER — Telehealth (INDEPENDENT_AMBULATORY_CARE_PROVIDER_SITE_OTHER): Payer: Self-pay | Admitting: Internal Medicine

## 2015-11-10 LAB — HEMOGLOBIN A1C, HPLC: Hemoglobin A1C: 6.1 % — ABNORMAL HIGH (ref 4.0–6.0)

## 2015-11-10 NOTE — Telephone Encounter (Signed)
Please do a TCM on patient.  I need to f/u on his kidney function.

## 2015-11-13 NOTE — Telephone Encounter (Signed)
Was unable to reach for TCM.    Per chart review he has an appt with urology on 12/5.

## 2015-11-17 ENCOUNTER — Telehealth (HOSPITAL_BASED_OUTPATIENT_CLINIC_OR_DEPARTMENT_OTHER): Payer: Self-pay | Admitting: Urology

## 2015-11-17 ENCOUNTER — Ambulatory Visit (HOSPITAL_BASED_OUTPATIENT_CLINIC_OR_DEPARTMENT_OTHER): Payer: Self-pay | Admitting: Urology

## 2015-11-17 NOTE — Telephone Encounter (Signed)
Called patient and let him know Dr. Jodi Mourning wanted him to go to a Hyperbaric Chamber at [facility] or [facility].  Patient to follow up in a few months.

## 2015-11-17 NOTE — Telephone Encounter (Signed)
Roger [name] is unable to make it to his appointment today. We would like him to be seen either at [facility] or [facility] for hyperbaric oxygen therapy for his radiation cystitis.

## 2015-11-18 NOTE — Telephone Encounter (Signed)
I've left a message that PCP would like to see him for follow up.  Encouraged patient to call back today to schedule.

## 2015-11-18 NOTE — Telephone Encounter (Signed)
Roger [name] returned my call.  He's been scheduled for follow up on Friday.

## 2015-11-21 ENCOUNTER — Telehealth (INDEPENDENT_AMBULATORY_CARE_PROVIDER_SITE_OTHER): Payer: Self-pay

## 2015-11-21 ENCOUNTER — Encounter (INDEPENDENT_AMBULATORY_CARE_PROVIDER_SITE_OTHER): Payer: Self-pay | Admitting: Internal Medicine

## 2015-11-21 NOTE — Telephone Encounter (Signed)
Phone call from patient.  He's having a family emergency and is unable to come to see PCP today as scheduled.  States that he has been unable to urinate since last night.    He was discharged from [facility] on 11/27 following treatment for clot retention   acute kidney injury .    I encouraged Roger [name] to seek care at ER if he is unable to urinate and he states he will do so if needed.    He requested an appt for Saturday and this was scheduled.

## 2015-11-21 NOTE — Telephone Encounter (Signed)
FYI to PCP as well    Is this patient stable to attempt cath in house tomorrow?

## 2015-11-22 ENCOUNTER — Encounter (INDEPENDENT_AMBULATORY_CARE_PROVIDER_SITE_OTHER): Payer: Self-pay | Admitting: Internal Medicine

## 2015-11-22 ENCOUNTER — Inpatient Hospital Stay: Payer: Self-pay

## 2015-11-22 NOTE — Telephone Encounter (Signed)
Spoke to IM MD - moved to Dr. Raphael Gibney schedule for saturday

## 2015-11-23 NOTE — Progress Notes (Signed)
This encounter was opened in error.

## 2015-11-24 ENCOUNTER — Telehealth (INDEPENDENT_AMBULATORY_CARE_PROVIDER_SITE_OTHER): Payer: Self-pay | Admitting: Internal Medicine

## 2015-11-24 NOTE — Telephone Encounter (Signed)
Pt admitted to [place], please check to see if he was discharged and get records.

## 2015-11-28 NOTE — Telephone Encounter (Signed)
Remains inpatient

## 2015-12-01 NOTE — Telephone Encounter (Signed)
Discharged 11/29/2015  Discharge report sent to PCP    If not received, we can request it again from medical records  (Phone: [phone])

## 2015-12-06 ENCOUNTER — Inpatient Hospital Stay: Payer: Self-pay

## 2015-12-07 ENCOUNTER — Inpatient Hospital Stay: Payer: Self-pay

## 2015-12-26 ENCOUNTER — Encounter (INDEPENDENT_AMBULATORY_CARE_PROVIDER_SITE_OTHER): Payer: Self-pay

## 2016-01-16 ENCOUNTER — Telehealth (INDEPENDENT_AMBULATORY_CARE_PROVIDER_SITE_OTHER): Payer: Self-pay

## 2016-01-16 NOTE — Telephone Encounter (Addendum)
Received an outpatient clinic note from [place] for Dr. Hilda Blades to view.    Put in scan pile since it is an outside record.

## 2016-01-19 ENCOUNTER — Encounter (INDEPENDENT_AMBULATORY_CARE_PROVIDER_SITE_OTHER): Payer: PPO | Admitting: Internal Medicine

## 2016-01-19 ENCOUNTER — Telehealth (INDEPENDENT_AMBULATORY_CARE_PROVIDER_SITE_OTHER): Payer: Self-pay | Admitting: Internal Medicine

## 2016-01-19 NOTE — Telephone Encounter (Signed)
Spoke to patient regarding his cancelled visit.    Blood clots better with hyperbaric treatment.  Foot swelling persists.    He has pain.      Please schedule for Friday.

## 2016-01-20 NOTE — Telephone Encounter (Signed)
LVM for pt in regards to message below. PSR/CCR when pt calls back please schedule appt for follow up with PCP Dr. Hilda Blades.Thank you. CCR's if unable to schedule, please warm transfer call to ext [phone].    Will postpone msg for a f/u call by PSR on 01/21/16 for 2nd attempt.

## 2016-01-21 NOTE — Telephone Encounter (Signed)
2nd attempt   LVM  CCR-Patient calls back please relay message below and schedule appointment

## 2016-01-23 ENCOUNTER — Encounter (INDEPENDENT_AMBULATORY_CARE_PROVIDER_SITE_OTHER): Payer: Self-pay | Admitting: Internal Medicine

## 2016-01-23 NOTE — Telephone Encounter (Signed)
Left Message - 3rd attempt. Sending letter home. Closing TE.

## 2016-01-29 ENCOUNTER — Other Ambulatory Visit: Payer: Self-pay | Admitting: Internal Medicine

## 2016-01-29 DIAGNOSIS — I10 Essential (primary) hypertension: Secondary | ICD-10-CM

## 2016-01-31 ENCOUNTER — Other Ambulatory Visit (INDEPENDENT_AMBULATORY_CARE_PROVIDER_SITE_OTHER): Payer: Self-pay | Admitting: Internal Medicine

## 2016-01-31 DIAGNOSIS — R609 Edema, unspecified: Secondary | ICD-10-CM

## 2016-02-03 ENCOUNTER — Telehealth: Payer: Self-pay | Admitting: Internal Medicine

## 2016-02-03 MED ORDER — FUROSEMIDE 20 MG OR TABS
ORAL_TABLET | ORAL | Status: AC
Start: 2016-02-03 — End: ?

## 2016-02-03 NOTE — Telephone Encounter (Signed)
Rx was faxed this morning to pharmacy. Pt was notified.Closing TE

## 2016-02-03 NOTE — Telephone Encounter (Signed)
(  TEXTING IS AN OPTION FOR [facility] CLINICS ONLY)  Is this a [place] clinic? No      RETURN CALL: Detailed message on voicemail only      SUBJECT:  Refill Request    MEDICATION(S): Furosemide 20 MG Oral Tab    NEEDED BY: as soon as possible   PRESCRIBING PROVIDER: A[place]  [facility] [address] [place] PHONE: [phone]  PHARMACY FAX NUMBER: [phone]  ADDITIONAL INFORMATION: Pharmacy waiting on Dr's approval

## 2016-02-14 ENCOUNTER — Inpatient Hospital Stay: Payer: Self-pay

## 2016-02-14 ENCOUNTER — Other Ambulatory Visit (HOSPITAL_BASED_OUTPATIENT_CLINIC_OR_DEPARTMENT_OTHER): Payer: Self-pay | Admitting: Pathology

## 2016-02-16 ENCOUNTER — Encounter (INDEPENDENT_AMBULATORY_CARE_PROVIDER_SITE_OTHER): Payer: PPO | Admitting: Internal Medicine

## 2016-02-16 ENCOUNTER — Telehealth (INDEPENDENT_AMBULATORY_CARE_PROVIDER_SITE_OTHER): Payer: Self-pay | Admitting: Internal Medicine

## 2016-02-16 ENCOUNTER — Encounter (INDEPENDENT_AMBULATORY_CARE_PROVIDER_SITE_OTHER): Payer: Self-pay

## 2016-02-16 ENCOUNTER — Encounter (INDEPENDENT_AMBULATORY_CARE_PROVIDER_SITE_OTHER): Payer: Self-pay | Admitting: Internal Medicine

## 2016-02-16 NOTE — Telephone Encounter (Signed)
Pt died at [place] on 02/28/2016, please update chart and cancel appt today.

## 2016-02-16 NOTE — Telephone Encounter (Signed)
RECEIVED DEATH NOTICE  FROM : [facility] ER Report    DATE OF DEATH : 2016/03/02    PLACE OF DEATH: [facility]    DEATH CERTIFICATE :  NO                SCANNED :  NO     ROUTED TO PCP : Yes,Dr. Hilda Blades    SENT HELP TICKET :  YES   ( initial [facility])

## 2016-02-16 NOTE — Telephone Encounter (Signed)
Please see ER/Urgent Care report Scan    ER where seen: [place]    Diagnosis/Reason for Visit: Cardiac Arrest    F/U appt made yet:No    Next steps: Routed to PCP-Pt passed away

## 2016-02-19 NOTE — Telephone Encounter (Signed)
Pt chart has been updated. No apts scheduled. Closing TE

## 2016-02-20 ENCOUNTER — Telehealth (INDEPENDENT_AMBULATORY_CARE_PROVIDER_SITE_OTHER): Payer: Self-pay | Admitting: Internal Medicine

## 2016-02-20 ENCOUNTER — Other Ambulatory Visit (HOSPITAL_COMMUNITY): Payer: Self-pay | Admitting: Anatomic Pathology & Clinical Pathology

## 2016-02-20 ENCOUNTER — Other Ambulatory Visit (HOSPITAL_BASED_OUTPATIENT_CLINIC_OR_DEPARTMENT_OTHER): Payer: Self-pay | Admitting: Anatomic Pathology & Clinical Pathology

## 2016-02-20 LAB — CHEMISTRY TESTS, VITREOUS
Chloride, FLD: 93 meq/L
Creatinine, FLD: 2 mg/dL
Glucose, FLD: 20 mg/dL
Potassium, FLD: >10 meq/L
Sodium, FLD: 134 meq/L
Urea Nitrogen, FLD: 32 mg/dL

## 2016-02-20 LAB — BETA HYDROXYBUTYRATE, FLD: Beta Hydroxybutyrate, FLD: 1.45 mmol/L

## 2016-02-20 LAB — AUTOPSY SPECIMEN SITE

## 2016-02-20 NOTE — Telephone Encounter (Signed)
Dr. Mliss Fritz is calling to speak with Dr. Hilda Blades directly regarding the Autopsy Report. Please call her on her cell phone at [phone]. Thank you.

## 2016-02-20 NOTE — Telephone Encounter (Signed)
Routing to PCP

## 2016-02-24 ENCOUNTER — Other Ambulatory Visit (HOSPITAL_BASED_OUTPATIENT_CLINIC_OR_DEPARTMENT_OTHER): Payer: Self-pay | Admitting: Pathology

## 2016-02-24 LAB — AUTOPSY, PROVISIONAL

## 2016-02-27 NOTE — Telephone Encounter (Signed)
I spoke to Dr. Mliss Fritz last Friday.

## 2016-03-02 ENCOUNTER — Other Ambulatory Visit: Payer: Self-pay | Admitting: Internal Medicine

## 2016-03-02 NOTE — Telephone Encounter (Signed)
Informed [facility] pharmacy via fax, patient is deceased.  Please update their records.

## 2016-03-08 LAB — AUTOPSY, ADULT

## 2016-03-13 DEATH — deceased

## 2016-04-01 ENCOUNTER — Other Ambulatory Visit (HOSPITAL_BASED_OUTPATIENT_CLINIC_OR_DEPARTMENT_OTHER): Payer: Self-pay | Admitting: Pathology

## 2016-04-01 LAB — AUTOPSY, ADULT

## 2016-06-18 ENCOUNTER — Emergency Department: Admit: 2016-06-18 | Payer: PRIVATE HEALTH INSURANCE | Primary: Family Medicine

## 2016-06-18 ENCOUNTER — Inpatient Hospital Stay
Admit: 2016-06-18 | Discharge: 2016-06-18 | Disposition: A | Discharge status: Home / Self Care | Payer: PRIVATE HEALTH INSURANCE | Attending: Emergency Medicine

## 2016-06-18 ENCOUNTER — Emergency Department: Payer: PRIVATE HEALTH INSURANCE | Primary: Family Medicine

## 2016-06-18 DIAGNOSIS — M25551 Pain in right hip: Secondary | ICD-10-CM

## 2016-06-18 NOTE — ED Notes (Signed)
Pt ambulated in hallway with unsteady gait. He has contacted a friend ,Brett Becker for a ride home. Discharge instructions reviewed with patient. Patient verbalizes understanding. Paperwork in hand. He is awaiting a ride home at this time.

## 2016-06-18 NOTE — ED Triage Notes (Signed)
Patient arrives via EMS from home. Patient fell this morning around 0900. ETOH on board. Right hip pain.

## 2016-06-18 NOTE — ED Notes (Signed)
Patient ambulated around room, very unsteady.

## 2016-06-18 NOTE — ED Notes (Signed)
Pt's friend here to take home at this time. Discharge education reviewed. Pt assisted to vehicle

## 2016-06-18 NOTE — ED Provider Notes (Signed)
HPI Comments: 55 year old male presents after a fall at 9 AM.  He reports chronic back pain and his legs occasionally give out.  Patient states left leg gave out today causing him to fall on his right leg.  He was able to crawl to a chair where he remained most of the day.  He was unable to bear weight "well", so called EMS.  Reports pain in the right inner thigh.  Denies new numbness or tingling.  No increased back pain from baseline.    Patient is a 55 y.o. male presenting with fall. The history is provided by the patient.   Fall   Pertinent negatives include no fever, no numbness and no headaches.        Past Medical History:   Diagnosis Date   ??? 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  Found to have GI bleeding secondary to gastric ullcers.   ??? Gastric ulcer September, 2014    Found at EGD to have major gastric ulcers.       Past Surgical History:   Procedure Laterality Date   ??? HX BACK SURGERY      x 2   ??? HX ENDOSCOPY  September, 2014    Major gastric ulcer   ??? NEUROLOGICAL PROCEDURE UNLISTED           Family History:   Problem Relation Age of Onset   ??? Heart Disease Mother      CHF       Social History     Social History   ??? Marital status: SINGLE     Spouse name: N/A   ??? Number of children: N/A   ??? Years of education: N/A     Occupational History   ??? Disabled Not Employed     disabled secondary to back injury     Social History Main Topics   ??? Smoking status: Current Every Day Smoker     Packs/day: 1.00     Years: 40.00     Types: Cigarettes   ??? Smokeless tobacco: Never Used   ??? Alcohol use Yes   ??? Drug use: 2.00 per week      Comment: marijuana    ??? Sexual activity: Not on file     Other Topics Concern   ??? 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   HENT: Negative for facial swelling.    Eyes: Negative for visual disturbance.   Musculoskeletal: Positive for arthralgias. Negative for back pain and joint swelling.   Skin: Negative for wound.   Neurological: Negative for weakness, numbness and headaches.       Vitals:    06/18/16 1751   BP: (!) 165/106   Pulse: 71   Resp: 16   Temp: 97.1 ??F (36.2 ??C)   SpO2: 96%   Weight: 52.2 kg (115 lb)   Height: 5\' 8"  (1.727 m)            Physical Exam   Constitutional: He appears well-developed and well-nourished.   Poorly groomed, appears older than stated age   HENT:  Head: Normocephalic and atraumatic.   Eyes: Conjunctivae are normal. Pupils are equal, round, and reactive to light.   Neck: Normal range of motion. Neck supple.   Musculoskeletal:        Right hip: He exhibits normal range of motion, no tenderness, no swelling, no crepitus and no deformity.        Lumbar back: He exhibits normal range of motion, no tenderness and no swelling.        Legs:  Neurological: He has normal strength. No sensory deficit.   Nursing note and vitals reviewed.       MDM  Number of Diagnoses or Management Options  Diagnosis management comments: Parts of this document were created using dragon voice recognition software. The chart has been reviewed but errors may still be present.         Amount and/or Complexity of Data Reviewed  Tests in the radiology section of CPT??: ordered and reviewed (Xr Spine Lumb 2 Or 3 V    Result Date: 06/18/2016  LUMBAR SPINE, 3 views. HISTORY: Low back pain following fall.. TECHNIQUE: AP, lateral and cone-down lumbosacral junction films. FINDINGS: Spinal alignment anatomic. Pedicles appear intact. Degenerative changes lower lumbar spine.  Disk spaces narrowed at L5-S1. Aortic arch calcifications are present.     IMPRESSION: Negative for acute lumbar spine fracture. Mild degenerative change.     Xr Hip Rt W Or Wo Pelv 2-3 Vws    Result Date: 06/18/2016   RIGHT HIP, 3 views. HISTORY: Right hip pain following fall. TECHNIQUE: AP view of the pelvis and coned down AP and frogleg lateral of the hip. FINDINGS: Bony pelvic ring appears intact. Right femoral head and neck and proximal shaft appear intact. SI joints symmetric. Pubic rami also appear intact.     IMPRESSION:  Negative for acute fracture.     )    Risk of Complications, Morbidity, and/or Mortality  General comments: 6:53 PM  xrays negative. Able to ambulate, but feels unsteady. Has h/o alcohol abuse.     I discussed the results of all labs, procedures, radiographs, and treatments with the patient and available family.  Treatment plan is agreed upon and the patient is ready for discharge.  Questions about treatment in the ED and differential diagnosis of presenting condition were answered.  Patient was given verbal discharge instructions including, but not limited to, importance of returning to the emergency department for any concern of worsening or continued symptoms.  Instructions were given to follow up with a primary care provider or specialist within 1-2 days.  Adverse effects of medications, if prescribed, were discussed and patient was advised to refrain from significant physical activity until followed up by primary care physician and to not drive or operate heavy machinery after taking any sedating substances.            ED Course       Procedures

## 2016-06-28 ENCOUNTER — Inpatient Hospital Stay
Admit: 2016-06-28 | Discharge: 2016-07-13 | Disposition: E | Payer: MEDICAID | Attending: Critical Care Medicine | Admitting: Critical Care Medicine

## 2016-06-28 ENCOUNTER — Emergency Department: Admit: 2016-06-28 | Payer: MEDICAID | Primary: Family Medicine

## 2016-06-28 DIAGNOSIS — A419 Sepsis, unspecified organism: Principal | ICD-10-CM

## 2016-06-28 LAB — BLOOD GAS, ARTERIAL
Arterial O2 Hgb: 95.6 % (ref 94.0–97.0)
BASE DEFICIT: 12.5 mmol/L — ABNORMAL HIGH (ref 0–2)
BICARBONATE: 12 mmol/L — ABNORMAL LOW (ref 22.0–26.0)
CARBOXYHEMOGLOBIN: 0.3 % — ABNORMAL LOW (ref 0.5–1.5)
DEOXYHEMOGLOBIN: 4 % (ref 0.0–5.0)
FIO2: 40 %
METHEMOGLOBIN: 0.5 % (ref 0.0–1.5)
O2 SAT: 96 % (ref 92.0–98.5)
PCO2: 22 mmHg — ABNORMAL LOW (ref 35.0–45.0)
PEEP/CPAP: 10
PO2: 105 mmHg — ABNORMAL HIGH (ref 75.0–100.0)
TOTAL HEMOGLOBIN: 12.6 GM/DL (ref 11.7–15.0)
pH: 7.33 — ABNORMAL LOW (ref 7.35–7.45)

## 2016-06-28 LAB — CBC WITH AUTOMATED DIFF
ABS. BASOPHILS: 0 10*3/uL (ref 0.0–0.2)
ABS. BASOPHILS: 0 10*3/uL (ref 0.0–0.2)
ABS. EOSINOPHILS: 0 10*3/uL (ref 0.0–0.8)
ABS. EOSINOPHILS: 0 10*3/uL (ref 0.0–0.8)
ABS. IMM. GRANS.: 0.1 10*3/uL (ref 0.0–0.5)
ABS. IMM. GRANS.: 0 10*3/uL (ref 0.0–0.5)
ABS. LYMPHOCYTES: 0.5 10*3/uL (ref 0.5–4.6)
ABS. LYMPHOCYTES: 0.2 10*3/uL — ABNORMAL LOW (ref 0.5–4.6)
ABS. MONOCYTES: 0.5 10*3/uL (ref 0.1–1.3)
ABS. MONOCYTES: 0.3 10*3/uL (ref 0.1–1.3)
ABS. NEUTROPHILS: 8.1 10*3/uL (ref 1.7–8.2)
ABS. NEUTROPHILS: 5.5 10*3/uL (ref 1.7–8.2)
BASOPHILS: 0 % (ref 0.0–2.0)
BASOPHILS: 0 % (ref 0.0–2.0)
EOSINOPHILS: 0 % — ABNORMAL LOW (ref 0.5–7.8)
EOSINOPHILS: 0 % — ABNORMAL LOW (ref 0.5–7.8)
HCT: 37.6 % — ABNORMAL LOW (ref 41.1–50.3)
HCT: 33.7 % — ABNORMAL LOW (ref 41.1–50.3)
HGB: 14.2 g/dL (ref 13.6–17.2)
HGB: 12.5 g/dL — ABNORMAL LOW (ref 13.6–17.2)
IMMATURE GRANULOCYTES: 0.5 % (ref 0.0–5.0)
IMMATURE GRANULOCYTES: 0.3 % (ref 0.0–5.0)
LYMPHOCYTES: 6 % — ABNORMAL LOW (ref 13–44)
LYMPHOCYTES: 4 % — ABNORMAL LOW (ref 13–44)
MCH: 33.3 PG — ABNORMAL HIGH (ref 26.1–32.9)
MCH: 32.7 PG (ref 26.1–32.9)
MCHC: 37.8 g/dL — ABNORMAL HIGH (ref 31.4–35.0)
MCHC: 37.1 g/dL — ABNORMAL HIGH (ref 31.4–35.0)
MCV: 88.3 FL (ref 79.6–97.8)
MCV: 88.2 FL (ref 79.6–97.8)
MONOCYTES: 5 % (ref 4.0–12.0)
MONOCYTES: 5 % (ref 4.0–12.0)
MPV: 11.9 FL (ref 10.8–14.1)
MPV: 11.7 FL (ref 10.8–14.1)
NEUTROPHILS: 89 % — ABNORMAL HIGH (ref 43–78)
NEUTROPHILS: 91 % — ABNORMAL HIGH (ref 43–78)
PLATELET: 65 10*3/uL — ABNORMAL LOW (ref 150–450)
PLATELET: 52 10*3/uL — ABNORMAL LOW (ref 150–450)
RBC: 4.26 M/uL (ref 4.23–5.67)
RBC: 3.82 M/uL — ABNORMAL LOW (ref 4.23–5.67)
RDW: 11.6 % — ABNORMAL LOW (ref 11.9–14.6)
RDW: 11.7 % — ABNORMAL LOW (ref 11.9–14.6)
WBC: 9.1 10*3/uL (ref 4.3–11.1)
WBC: 6 10*3/uL (ref 4.3–11.1)

## 2016-06-28 LAB — URINALYSIS W/ RFLX MICROSCOPIC
Bacteria: 0 /hpf
Bilirubin: NEGATIVE
Glucose: NEGATIVE mg/dL
Leukocyte Esterase: NEGATIVE
Nitrites: NEGATIVE
Protein: 300 mg/dL — AB
Specific gravity: 1.012 (ref 1.001–1.023)
Urobilinogen: 0.2 EU/dL (ref 0.2–1.0)
pH (UA): 5.5 (ref 5.0–9.0)

## 2016-06-28 LAB — POC LACTIC ACID
Lactic Acid (POC): 9.2 mmol/L — ABNORMAL HIGH (ref 0.5–1.9)
Lactic Acid (POC): 5.3 mmol/L — ABNORMAL HIGH (ref 0.5–1.9)

## 2016-06-28 LAB — BLOOD GAS & LYTES, ARTERIAL
ALLENS TEST: POSITIVE
Arterial O2 Hgb: 94.4 % (ref 94.0–97.0)
BASE DEFICIT: 16 mmol/L — ABNORMAL HIGH (ref 0–2)
BICARBONATE: 10 mmol/L — ABNORMAL LOW (ref 22.0–26.0)
CARBOXYHEMOGLOBIN: 1.8 % — ABNORMAL HIGH (ref 0.5–1.5)
CHLORIDE: 76 — ABNORMAL LOW (ref 98–106)
Calcium, ionized: 1.02 mmol/L (ref 1.0–1.3)
DEOXYHEMOGLOBIN: 3 % (ref 0.0–5.0)
METHEMOGLOBIN: 0.6 % (ref 0.0–1.5)
O2 FLOW: 8 L/min
O2 SAT: 97 % (ref 92.0–98.5)
PCO2: 25 mmHg — ABNORMAL LOW (ref 35.0–45.0)
PO2: 107 mmHg — ABNORMAL HIGH (ref 75.0–100.0)
POTASSIUM: 4.31 MMOL/L (ref 3.5–5.3)
Sodium: 109.2 MMOL/L — CL (ref 135–148)
TOTAL HEMOGLOBIN: 14.7 GM/DL (ref 11.7–15.0)
pH: 7.21 — CL (ref 7.35–7.45)

## 2016-06-28 LAB — OSMOLALITY, UR: Osmolality,urine: 304 MOSM/kg H2O (ref 50–1400)

## 2016-06-28 LAB — METABOLIC PANEL, COMPREHENSIVE
A-G Ratio: 0.5 — ABNORMAL LOW (ref 1.2–3.5)
ALT (SGPT): 80 U/L — ABNORMAL HIGH (ref 12–65)
AST (SGOT): 186 U/L — ABNORMAL HIGH (ref 15–37)
Albumin: 2.7 g/dL — ABNORMAL LOW (ref 3.5–5.0)
Alk. phosphatase: 70 U/L (ref 50–136)
Anion gap: 36 mmol/L — ABNORMAL HIGH (ref 7–16)
BUN: 18 MG/DL (ref 6–23)
Bilirubin, total: 1.6 MG/DL — ABNORMAL HIGH (ref 0.2–1.1)
CO2: 15 mmol/L — ABNORMAL LOW (ref 21–32)
Calcium: 8.1 MG/DL — ABNORMAL LOW (ref 8.3–10.4)
Chloride: 74 mmol/L — ABNORMAL LOW (ref 98–107)
Creatinine: 1.74 MG/DL — ABNORMAL HIGH (ref 0.8–1.5)
GFR est AA: 52 mL/min/{1.73_m2} — ABNORMAL LOW (ref >60)
GFR est non-AA: 43 mL/min/{1.73_m2} — ABNORMAL LOW (ref >60)
Globulin: 5 g/dL — ABNORMAL HIGH (ref 2.3–3.5)
Glucose: 86 mg/dL (ref 65–100)
Potassium: 4.1 mmol/L (ref 3.5–5.1)
Protein, total: 7.7 g/dL (ref 6.3–8.2)
Sodium: 125 mmol/L — ABNORMAL LOW (ref 136–145)

## 2016-06-28 LAB — RETICULOCYTE COUNT
Absolute Retic Cnt.: 0.0616 M/ul (ref 0.026–0.095)
Immature Retic Fraction: 6.6 % (ref 2.3–13.4)
Retic Hgb Conc.: 36 pg — ABNORMAL HIGH (ref 29–35)
Reticulocyte count: 1.7 % (ref 0.3–2.0)

## 2016-06-28 LAB — MRSA SCREEN - PCR (NASAL)

## 2016-06-28 LAB — MAGNESIUM
Magnesium: 1.7 mg/dL — ABNORMAL LOW (ref 1.8–2.4)
Magnesium: 2.1 mg/dL (ref 1.8–2.4)

## 2016-06-28 LAB — OSMOLALITY, SERUM/PLASMA: Osmolality, serum/plasma: 251 MOSM/kg H2O — ABNORMAL LOW (ref 275–295)

## 2016-06-28 LAB — METABOLIC PANEL, BASIC
Anion gap: 18 mmol/L — ABNORMAL HIGH (ref 7–16)
Anion gap: 19 mmol/L — ABNORMAL HIGH (ref 7–16)
BUN: 26 MG/DL — ABNORMAL HIGH (ref 6–23)
BUN: 29 MG/DL — ABNORMAL HIGH (ref 6–23)
CO2: 16 mmol/L — ABNORMAL LOW (ref 21–32)
CO2: 17 mmol/L — ABNORMAL LOW (ref 21–32)
Calcium: 7.2 MG/DL — ABNORMAL LOW (ref 8.3–10.4)
Calcium: 7.2 MG/DL — ABNORMAL LOW (ref 8.3–10.4)
Chloride: 83 mmol/L — ABNORMAL LOW (ref 98–107)
Chloride: 88 mmol/L — ABNORMAL LOW (ref 98–107)
Creatinine: 1.27 MG/DL (ref 0.8–1.5)
Creatinine: 1.38 MG/DL (ref 0.8–1.5)
GFR est AA: >60 mL/min/{1.73_m2} (ref >60)
GFR est AA: >60 mL/min/{1.73_m2} (ref >60)
GFR est non-AA: 57 mL/min/{1.73_m2} — ABNORMAL LOW (ref >60)
GFR est non-AA: >60 mL/min/{1.73_m2} (ref >60)
Glucose: 107 mg/dL — ABNORMAL HIGH (ref 65–100)
Glucose: 121 mg/dL — ABNORMAL HIGH (ref 65–100)
Potassium: 3.8 mmol/L (ref 3.5–5.1)
Potassium: 4 mmol/L (ref 3.5–5.1)
Sodium: 118 mmol/L — CL (ref 136–145)
Sodium: 123 mmol/L — CL (ref 136–145)

## 2016-06-28 LAB — DRUG SCREEN, URINE
AMPHETAMINES: NEGATIVE
BARBITURATES: NEGATIVE
BENZODIAZEPINES: NEGATIVE
COCAINE: POSITIVE
METHADONE: NEGATIVE
OPIATES: NEGATIVE
PCP(PHENCYCLIDINE): NEGATIVE
THC (TH-CANNABINOL): NEGATIVE

## 2016-06-28 LAB — TROPONIN I: Troponin-I, Qt.: 1.22 NG/ML — CR (ref 0.02–0.05)

## 2016-06-28 LAB — POC TROPONIN
Troponin-I (POC): 0.68 ng/ml — ABNORMAL HIGH (ref 0.0–0.08)
Troponin-I (POC): 0.66 ng/ml — ABNORMAL HIGH (ref 0.0–0.08)

## 2016-06-28 LAB — LACTIC ACID: Lactic acid: 3 MMOL/L — CR (ref 0.4–2.0)

## 2016-06-28 LAB — BNP: BNP: 1534 pg/mL

## 2016-06-28 LAB — ETHYL ALCOHOL: ALCOHOL(ETHYL),SERUM: 27 MG/DL

## 2016-06-28 LAB — PHOSPHORUS: Phosphorus: 5.9 MG/DL — ABNORMAL HIGH (ref 2.5–4.5)

## 2016-06-28 LAB — PROCALCITONIN: Procalcitonin: 1.2 ng/mL

## 2016-06-28 MED ORDER — PIPERACILLIN-TAZOBACTAM 4.5 GRAM IV SOLR
4.5 gram | Freq: Four times a day (QID) | INTRAVENOUS | Status: DC
Start: 2016-06-28 — End: 2016-06-28

## 2016-06-28 MED ORDER — MAGNESIUM SULFATE 2 GRAM/50 ML IVPB
2 gram/50 mL (4 %) | INTRAVENOUS | Status: AC
Start: 2016-06-28 — End: 2016-06-28
  Administered 2016-06-28: 08:00:00 via INTRAVENOUS

## 2016-06-28 MED ORDER — SODIUM CHLORIDE 0.9 % IV
INTRAVENOUS | Status: DC
Start: 2016-06-28 — End: 2016-06-29
  Administered 2016-06-28 – 2016-06-29 (×4): via INTRAVENOUS

## 2016-06-28 MED ORDER — IPRATROPIUM-ALBUTEROL 2.5 MG-0.5 MG/3 ML NEB SOLUTION
2.5 mg-0.5 mg/3 ml | RESPIRATORY_TRACT | Status: DC
Start: 2016-06-28 — End: 2016-06-30
  Administered 2016-06-28 – 2016-06-30 (×14): via RESPIRATORY_TRACT

## 2016-06-28 MED ORDER — ADV ADDAPTOR
1000 mg | Freq: Once | Status: AC
Start: 2016-06-28 — End: 2016-06-28
  Administered 2016-06-28: 07:00:00 via INTRAVENOUS

## 2016-06-28 MED ORDER — LORAZEPAM 2 MG/ML IJ SOLN
2 mg/mL | Freq: Four times a day (QID) | INTRAMUSCULAR | Status: DC | PRN
Start: 2016-06-28 — End: 2016-06-29
  Administered 2016-06-28 – 2016-06-29 (×3): via INTRAVENOUS

## 2016-06-28 MED ORDER — PIPERACILLIN-TAZOBACTAM 3.375 GRAM IV SOLR
3.375 gram | Freq: Three times a day (TID) | INTRAVENOUS | Status: DC
Start: 2016-06-28 — End: 2016-06-28

## 2016-06-28 MED ORDER — SODIUM CHLORIDE 0.9% BOLUS IV
0.9 % | Freq: Once | INTRAVENOUS | Status: AC
Start: 2016-06-28 — End: 2016-06-28
  Administered 2016-06-28: 06:00:00 via INTRAVENOUS

## 2016-06-28 MED ORDER — HALOPERIDOL LACTATE 5 MG/ML IJ SOLN
5 mg/mL | INTRAMUSCULAR | Status: AC
Start: 2016-06-28 — End: 2016-06-28
  Administered 2016-06-28: 12:00:00 via INTRAMUSCULAR

## 2016-06-28 MED ORDER — THIAMINE 100 MG/ML INJECTION
100 mg/mL | INTRAMUSCULAR | Status: AC
Start: 2016-06-28 — End: 2016-07-09
  Administered 2016-06-28 – 2016-06-29 (×2): via INTRAVENOUS

## 2016-06-28 MED ORDER — VANCOMYCIN IN 0.9 % SODIUM CHLORIDE 1.25 GRAM/250 ML IV
1.25 gram/250 mL | INTRAVENOUS | Status: DC
Start: 2016-06-28 — End: 2016-07-03
  Administered 2016-06-29 – 2016-07-02 (×4): via INTRAVENOUS

## 2016-06-28 MED ORDER — SODIUM CHLORIDE 0.9 % INJECTION
1.1 mg/mL | INTRAMUSCULAR | Status: AC | PRN
Start: 2016-06-28 — End: 2016-06-28
  Administered 2016-06-28: 16:00:00 via INTRAVENOUS

## 2016-06-28 MED ORDER — ADV ADDAPTOR
3.375 gram | Freq: Three times a day (TID) | Status: DC
Start: 2016-06-28 — End: 2016-07-03
  Administered 2016-06-28 – 2016-07-03 (×17): via INTRAVENOUS

## 2016-06-28 MED ORDER — FAMOTIDINE (PF) 20 MG/2 ML IV
20 mg/2 mL | Freq: Two times a day (BID) | INTRAVENOUS | Status: DC
Start: 2016-06-28 — End: 2016-07-02
  Administered 2016-06-28 – 2016-07-02 (×9): via INTRAVENOUS

## 2016-06-28 MED ORDER — SODIUM CHLORIDE 0.9 % IJ SYRG
Freq: Three times a day (TID) | INTRAMUSCULAR | Status: DC
Start: 2016-06-28 — End: 2016-07-11
  Administered 2016-06-28 – 2016-07-11 (×40): via INTRAVENOUS

## 2016-06-28 MED ORDER — PIPERACILLIN-TAZOBACTAM 4.5 GRAM IV SOLR
4.5 gram | Freq: Once | INTRAVENOUS | Status: AC
Start: 2016-06-28 — End: 2016-06-28
  Administered 2016-06-28: 07:00:00 via INTRAVENOUS

## 2016-06-28 MED ORDER — SODIUM CHLORIDE 0.9 % IJ SYRG
INTRAMUSCULAR | Status: DC | PRN
Start: 2016-06-28 — End: 2016-06-30

## 2016-06-28 MED ORDER — METHYLPREDNISOLONE (PF) 125 MG/2 ML IJ SOLR
125 mg/2 mL | Freq: Three times a day (TID) | INTRAMUSCULAR | Status: DC
Start: 2016-06-28 — End: 2016-07-02
  Administered 2016-06-28 – 2016-07-02 (×13): via INTRAVENOUS

## 2016-06-28 MED ORDER — SODIUM CHLORIDE 0.9 % IV
40 mg/mL | Freq: Once | INTRAVENOUS | Status: AC
Start: 2016-06-28 — End: 2016-06-28
  Administered 2016-06-28: 08:00:00 via INTRAVENOUS

## 2016-06-28 MED ORDER — SODIUM CHLORIDE 0.9 % IV
10 gram | Freq: Once | INTRAVENOUS | Status: DC
Start: 2016-06-28 — End: 2016-06-28

## 2016-06-28 MED ORDER — BUDESONIDE 0.5 MG/2 ML NEB SUSPENSION
0.5 mg/2 mL | Freq: Two times a day (BID) | RESPIRATORY_TRACT | Status: DC
Start: 2016-06-28 — End: 2016-06-30
  Administered 2016-06-28 – 2016-06-30 (×5): via RESPIRATORY_TRACT

## 2016-06-28 MED ORDER — NICOTINE 14 MG/24 HR DAILY PATCH
14 mg/24 hr | Freq: Every day | TRANSDERMAL | Status: DC
Start: 2016-06-28 — End: 2016-07-09
  Administered 2016-07-09: 16:00:00 via TRANSDERMAL

## 2016-06-28 MED ORDER — METHYLPREDNISOLONE (PF) 125 MG/2 ML IJ SOLR
125 mg/2 mL | Freq: Once | INTRAMUSCULAR | Status: AC
Start: 2016-06-28 — End: 2016-06-28
  Administered 2016-06-28: 07:00:00 via INTRAVENOUS

## 2016-06-28 MED ORDER — SODIUM CHLORIDE 0.9 % IJ SYRG
INTRAMUSCULAR | Status: DC | PRN
Start: 2016-06-28 — End: 2016-07-11

## 2016-06-28 MED ORDER — METHYLPREDNISOLONE (PF) 125 MG/2 ML IJ SOLR
125 mg/2 mL | Freq: Three times a day (TID) | INTRAMUSCULAR | Status: DC
Start: 2016-06-28 — End: 2016-06-28

## 2016-06-28 MED FILL — LORAZEPAM 2 MG/ML IJ SOLN: 2 mg/mL | INTRAMUSCULAR | Qty: 1

## 2016-06-28 MED FILL — SODIUM CHLORIDE 0.9 % IV: INTRAVENOUS | Qty: 1000

## 2016-06-28 MED FILL — BUDESONIDE 0.5 MG/2 ML NEB SUSPENSION: 0.5 mg/2 mL | RESPIRATORY_TRACT | Qty: 1

## 2016-06-28 MED FILL — PIPERACILLIN-TAZOBACTAM 3.375 GRAM IV SOLR: 3.375 gram | INTRAVENOUS | Qty: 3.38

## 2016-06-28 MED FILL — SOLU-MEDROL (PF) 125 MG/2 ML SOLUTION FOR INJECTION: 125 mg/2 mL | INTRAMUSCULAR | Qty: 2

## 2016-06-28 MED FILL — IPRATROPIUM-ALBUTEROL 2.5 MG-0.5 MG/3 ML NEB SOLUTION: 2.5 mg-0.5 mg/3 ml | RESPIRATORY_TRACT | Qty: 3

## 2016-06-28 MED FILL — VANCOMYCIN 1,000 MG IV SOLR: 1000 mg | INTRAVENOUS | Qty: 1000

## 2016-06-28 MED FILL — NICOTINE 14 MG/24 HR DAILY PATCH: 14 mg/24 hr | TRANSDERMAL | Qty: 1

## 2016-06-28 MED FILL — PIPERACILLIN-TAZOBACTAM 4.5 GRAM IV SOLR: 4.5 gram | INTRAVENOUS | Qty: 4.5

## 2016-06-28 MED FILL — HALOPERIDOL LACTATE 5 MG/ML IJ SOLN: 5 mg/mL | INTRAMUSCULAR | Qty: 1

## 2016-06-28 MED FILL — DEFINITY 1.1 MG/ML INTRAVENOUS SUSPENSION: 1.1 mg/mL | INTRAVENOUS | Qty: 1.3

## 2016-06-28 MED FILL — TOBRAMYCIN 40 MG/ML INJECTION: 40 mg/mL | INTRAMUSCULAR | Qty: 9

## 2016-06-28 MED FILL — MAGNESIUM SULFATE 2 GRAM/50 ML IVPB: 2 gram/50 mL (4 %) | INTRAVENOUS | Qty: 50

## 2016-06-28 MED FILL — FAMOTIDINE (PF) 20 MG/2 ML IV: 20 mg/2 mL | INTRAVENOUS | Qty: 2

## 2016-06-28 NOTE — H&P (Signed)
HISTORY AND PHYSICAL      Brett Becker    19[birth year]Jul-2017    Date of Admission:  07/19/16    The patient's chart is reviewed and the patient is discussed with the staff.    Subjective:     Patient is a 55 y.o. Caucasian male presents with worsening dyspnea.    Patient with COPD with bullous emphysema , active smoker (1PPD), ETOH abuse (about 12 pack per day), was brought in by ambulance since had distress at home. Per ER attending was bagged in field and then put on CPAP. Here had to be placed on BIPAP. Noted LA of 9. TNI of 1.2. Patient was not alert or answering questions. Patient with noted EKG changes with T-wave inversion in multiple leads as well. Noted wheezing and multiple electrolyte abnormalities and felts septic as well with possible PNA. Given triple abx, nebs, steroids. Given fluids as well. ER called cardiology to see patient as well.    Patient now on BIPAP at 14/12 50% and Rate of 24 and waking up. He reports was not doing well, but did not elaborate much. Reports does not use oxygen at home but has nebulizer but not sure what is in it. Does not report taking meds for anything else. Also does not report chest pain, but is not clear. Wheezing but not sure if coughing up secretions. Looks disheveled, denies being homeless. Does report drinks 12 pack of beer per day. NA is in 120's. Albumin in 2's. In ARF with of 1.7 and prior <1. Also with thrombocytopenia as well. Given fluids at 30 ml/kg in ER and repeat LA, TNI being done now.        Review of Systems  Limited since waking up and does not report much.     Patient Active Problem List   Diagnosis Code   ??? 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 Alcohol abuse F10.10   ??? HTN (hypertension) I10   ??? Gastric ulcer K25.9   ??? Hepatitis C B19.20   ??? 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 Hyponatremia E87.1   ??? COPD exacerbation (HCC) J44.1   ??? Hypoxia R09.02   ??? Underweight R63.6    ??? Lactic acidosis E87.2   ??? ETOH abuse F10.10   ??? Acute hypoxemic respiratory failure (HCC) J96.01   ??? Protein calorie malnutrition (HCC) E46   ??? Elevated troponin R74.8   ??? AKI (acute kidney injury) (HCC) N17.9   ??? Thrombocytopenia (HCC) D69.6       Prior to Admission Medications   Prescriptions Last Dose Informant Patient Reported? Taking?   albuterol-ipratropium (DUO-NEB) 2.5 mg-0.5 mg/3 ml nebu   No No   Sig: 3 mL by Nebulization route every six (6) hours as needed.   cloNIDine (CATAPRES) 0.2 mg/24 hr patch   No No   Sig: 1 Patch by TransDERmal route every seven (7) days.   diazepam (VALIUM) 5 mg tablet   No No   Sig: Take 0.5 Tabs by mouth every six (6) hours. Max Daily Amount: 10 mg.   levETIRAcetam (KEPPRA) 500 mg tablet   No No   Sig: Take 1 Tab by mouth two (2) times a day.   magnesium oxide (MAG-OX) 400 mg tablet   No No   Sig: Take 1 Tab by mouth two (2) times a day.      Facility-Administered Medications: None       Past Medical  History:   Diagnosis Date   ??? 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  Found to have GI bleeding secondary to gastric ullcers.   ??? Gastric ulcer September, 2014    Found at EGD to have major gastric ulcers.     Past Surgical History:   Procedure Laterality Date   ??? HX BACK SURGERY      x 2   ??? HX ENDOSCOPY  September, 2014    Major gastric ulcer   ??? NEUROLOGICAL PROCEDURE UNLISTED       Social History     Social History   ??? Marital status: SINGLE     Spouse name: N/A   ??? Number of children: N/A   ??? Years of education: N/A     Occupational History   ??? Disabled Not Employed     disabled secondary to back injury     Social History Main Topics   ??? Smoking status: Current Every Day Smoker     Packs/day: 1.00     Years: 40.00     Types: Cigarettes   ??? Smokeless tobacco: Never Used   ??? Alcohol use Yes   ??? Drug use: 2.00 per week      Comment: marijuana    ??? Sexual activity: Not on file     Other Topics Concern   ??? 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 Heart Disease Mother      CHF     Allergies   Allergen Reactions   ??? Codeine Rash       Current Facility-Administered Medications   Medication Dose Route Frequency   ??? magnesium sulfate 2 g/50 ml IVPB (premix or compounded)  2 g IntraVENous NOW   ??? sodium chloride (NS) flush 5-10 mL  5-10 mL IntraVENous PRN   ??? tobramycin (NEBCIN) 360 mg in 0.9% sodium chloride 100 mL IVPB  360 mg IntraVENous ONCE   ??? 0.9% sodium chloride infusion  75 mL/hr IntraVENous CONTINUOUS     Current Outpatient Prescriptions   Medication Sig   ??? diazepam (VALIUM) 5 mg tablet Take 0.5 Tabs by mouth every six (6) hours. Max Daily Amount: 10 mg.   ??? levETIRAcetam (KEPPRA) 500 mg tablet Take 1 Tab by mouth two (2) times a day.   ??? cloNIDine (CATAPRES) 0.2 mg/24 hr patch 1 Patch by TransDERmal route every seven (7) days.   ??? albuterol-ipratropium (DUO-NEB) 2.5 mg-0.5 mg/3 ml nebu 3 mL by Nebulization route every six (6) hours as needed.   ??? magnesium oxide (MAG-OX) 400 mg tablet Take 1 Tab by mouth two (2) times a day.           Objective:     Vitals:    07/05/2016 0240 06/27/2016 0301 06/15/2016 0321 06/12/2016 0340   BP: 123/86 141/82 135/86 (!) 143/91   Pulse: (!) 102 97 97 100   Resp: 29 30 27 21    SpO2: 98% 100% 100% 100%   Weight:       Height:           PHYSICAL EXAM     Constitutional:  the  patient is well developed and tolerating BIPAP at 12/10 and decreased to 40% RR at 24 since breathing about 34  EENMT:  Sclera clear, pupils equal, oral mucosa moist  Respiratory: b/l wheezing and some rhonchi  Cardiovascular:  RRR without M,G,R  Gastrointestinal: soft and non-tender; with positive bowel sounds.  Musculoskeletal: warm without cyanosis. There is no lower leg edema.  Skin:  no jaundice or rashes, dirty feet, cool feet   Neurologic: no gross neuro deficits     Psychiatric:  alert and answering question but takes time before he answers questions    CXR: diffuse bullous disease with noted haziness in RML and noted on prior CXR.       Old CT from 2014 with marked bullous disease with chronic changes in RML/RLL area                    Recent Labs      Jul 12, 2016   0119   WBC  9.1   HGB  14.2   HCT  37.6*   PLT  65*     Recent Labs      July 12, 2016   0119   NA  125*   K  4.1   CL  74*   GLU  86   CO2  15*   BUN  18   CREA  1.74*   MG  1.7*   CA  8.1*   TROIQ  1.22*   ALB  2.7*   TBILI  1.6*   ALT  80*   SGOT  186*     Recent Labs      12-Jul-2016   0115   PH  7.21*   PCO2  25*   PO2  107*   HCO3  10*     No results for input(s): LCAD, LAC in the last 72 hours.    Assessment:  (Medical Decision Making)     Hospital Problems  Date Reviewed: 12-Jul-2016          Codes Class Noted POA    * (Principal)Acute hypoxemic respiratory failure (HCC) ICD-10-CM: J96.01  ICD-9-CM: 518.81  07/12/16 Unknown        COPD exacerbation (HCC) ICD-10-CM: J44.1  ICD-9-CM: 491.21  2016/07/12 Yes        Lactic acidosis ICD-10-CM: E87.2  ICD-9-CM: 276.2  Jul 12, 2016 Unknown        ETOH abuse ICD-10-CM: F10.10  ICD-9-CM: 305.00  Jul 12, 2016 Unknown        Protein calorie malnutrition (HCC) ICD-10-CM: E46  ICD-9-CM: 263.9  07/12/2016 Unknown        Elevated troponin ICD-10-CM: R74.8  ICD-9-CM: 790.6  07/12/2016 Unknown        AKI (acute kidney injury) (HCC) ICD-10-CM: N17.9  ICD-9-CM: 584.9  2016/07/12 Unknown        Thrombocytopenia (HCC) ICD-10-CM: D69.6  ICD-9-CM: 287.5  07/12/2016 Unknown        Bullous emphysema- severe (Chronic) ICD-10-CM: J43.9  ICD-9-CM: 492.0  10/15/2013 Yes              Plan:  (Medical Decision Making)     --Will admit to ICU  --continue BIPAP and adjusted as per above. Awaiting new ABG  --NEBS  --steroids  --taper oxygen, but will likey need on dischage and will need to be assessed  --needs to quit smoking & drinking  --place nicotine patch  --check LA   --panculture and agree with abx -- will place on zosyn/vanco for now given severe sepsis  --  f/u TNI  --cardiology to see in AM  --check urine studies, drug screen  --Will place on banana bag  --ativan prn for withdrawals -- which will likely occur  --social worker to help with his social situation.  --check reticular count  --foley  --f/u lytes since high risk for re-feeding syndrome.  --wants to be CAT 1 for now.     More than 50% of the time documented was spent in face-to-face contact with the patient and in the care of the patient on the floor/unit where the patient is located.    Gunner Iodice Mcarthur Rossetti, MD

## 2016-06-28 NOTE — Progress Notes (Signed)
Unable to complete admission database and medication reconciliation due to patient on BIPAP, lethargic/confused, and no family or visitors at bedside.

## 2016-06-28 NOTE — ED Triage Notes (Signed)
Pt arrives to ED via EMS. EMS called out to house to find pt responsive and only speaking one word at a time, tripod stance, diaphoretic. Once en route to ED pt became un responsive. EMS began ventilating pt via bag mask. Approx 10 min ventilation pt vitals improved and alert enough for C-pap. Pt received 1 albuterol treatment 5mg . LS tight with little air flow. BGl 78. EMS states they could not obtain o2 sat. Pt hx smoking.

## 2016-06-28 NOTE — Progress Notes (Signed)
TRANSFER - IN REPORT:    Verbal report received from CarawayAmanda, Charity fundraiserN (name) on Brett KickJimmy Becker  being received from ER (unit) for routine progression of care      Report consisted of patient???s Situation, Background, Assessment and   Recommendations(SBAR).     Information from the following report(s) SBAR, Kardex, ED Summary, Procedure Summary, Intake/Output, Recent Results, Med Rec Status and Cardiac Rhythm Sinus Tachycardia was reviewed with the receiving nurse.    Opportunity for questions and clarification was provided.      Assessment completed upon patient???s arrival to unit and care assumed.

## 2016-06-28 NOTE — Progress Notes (Signed)
Bedside report received from McBaineKatherine, [place]RN.  Patient responds to pain, drowsy and lethargic.  ST to 117 on monitor, inverted T wave.  On airvo with O2 91%.  Excoriation/redness to bottom with some open skin.  Safety measures in place, will continue to monitor.     Late entry due to patient care.

## 2016-06-28 NOTE — Progress Notes (Signed)
Bedside and Verbal shift change report given to Marcelino DusterMichelle, Charity fundraiserN (oncoming nurse) by Florentina AddisonKatie, RN (offgoing nurse). Report included the following information SBAR, Kardex, ED Summary, Procedure Summary, Intake/Output, MAR, Recent Results, Med Rec Status and Cardiac Rhythm Sinus Tachycardia.     Skin assessed, patient turned

## 2016-06-28 NOTE — Progress Notes (Signed)
Pharmacokinetic Consult to Pharmacist    Brett Becker is a 55 y.o. male being treated for sepsis/pneumonia with Zosyn and vancomycin.    Height: 5\' 8"  (172.7 cm)  Weight: 52.2 kg (115 lb)  Lab Results   Component Value Date/Time    BUN 18 06/18/2016 01:19 AM    Creatinine 1.74 07/06/2016 01:19 AM    WBC 9.1 07/01/2016 01:19 AM    Procalcitonin 1.2 07/12/2016 01:19 AM    Lactic acid 1.3 03/20/2015 06:55 PM      Estimated Creatinine Clearance: 35 mL/min (based on Cr of 1.74).    All Micro Results     Procedure Component Value Units Date/Time    CULTURE, BLOOD [[id number]][[id number]] Collected:  06/24/2016 0210    Order Status:  Completed Specimen:  Blood from Blood Updated:  07/12/2016 0329    CULTURE, BLOOD [[id number]] Collected:  07/02/2016 0159    Order Status:  Completed Specimen:  Blood from Blood Updated:  06/17/2016 0329     Special Requests: RIGHT ANTECUBITAL        Culture result: PENDING          Day 1 of vancomycin.  Goal trough is 15-20.  Vancomycin dose initiated at 1.25 g q24h.   Will continue to follow patient.      Thank you,  Larena SoxJosh Gariboldi, PharmD

## 2016-06-28 NOTE — Progress Notes (Signed)
Patient off of BIPAP, able to sign admitting paperwork and answer admission assessment questions. Airvo at 45L, Spo2 is 90%. Patient is resting quietly, no complaints of pain. Dyspnea with exertion or any activity. Alert to person, self, and place; keeps requesting that he wants to "get out of here".   BP 144/81, HR 104.

## 2016-06-28 NOTE — ED Notes (Signed)
Report given to Amanda Fisher, RN for continuation of care.

## 2016-06-28 NOTE — ED Provider Notes (Addendum)
HPI Comments: 55 year old man in poor physical condition who has chronic lung disease nd while not listed on his past medical history appears to have COPD.  Patient also has a history of DTs and gastric ulcers.  Reports that patient was at home and became short of breath by the time EMS got there he was in distress and went unconscious for them had to be bagged for several minutes.  Once regaining consciousness he was placed on a CPAP machine and seems to be improving.    Patient is a 55 y.o. male presenting with respiratory distress syndrome.   Respiratory Distress   This is a new problem. The problem occurs continuously.The problem has been rapidly improving. Associated symptoms include wheezing. Pertinent negatives include no fever, no headaches, no coryza, no rhinorrhea, no sore throat, no swollen glands, no ear pain, no neck pain, no cough, no sputum production and no hemoptysis.        Past Medical History:   Diagnosis Date   ??? 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  Found to have GI bleeding secondary to gastric ullcers.   ??? Gastric ulcer September, 2014    Found at EGD to have major gastric ulcers.       Past Surgical History:   Procedure Laterality Date   ??? HX BACK SURGERY      x 2   ??? HX ENDOSCOPY  September, 2014    Major gastric ulcer   ??? NEUROLOGICAL PROCEDURE UNLISTED           Family History:   Problem Relation Age of Onset   ??? Heart Disease Mother      CHF       Social History     Social History   ??? Marital status: SINGLE     Spouse name: N/A   ??? Number of children: N/A   ??? Years of education: N/A     Occupational History   ??? Disabled Not Employed     disabled secondary to back injury     Social History Main Topics   ??? Smoking status: Current Every Day Smoker     Packs/day: 1.00     Years: 40.00     Types: Cigarettes   ??? Smokeless tobacco: Never Used   ??? Alcohol use Yes   ??? Drug use: 2.00 per week      Comment: marijuana    ??? Sexual activity: Not on file      Other Topics Concern   ??? 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  Negative for activity change and fever.   HENT: Negative.  Negative for ear pain, rhinorrhea and sore throat.    Eyes: Negative.    Respiratory: Positive for wheezing. Negative for cough, hemoptysis and sputum production.    Cardiovascular: Negative.    Gastrointestinal: Negative.    Genitourinary: Negative.    Musculoskeletal: Negative.  Negative for neck pain.   Skin: Negative.    Neurological: Negative.  Negative for headaches.   Psychiatric/Behavioral: Negative.    All other systems reviewed and are negative.  There were no vitals filed for this visit.         Physical Exam   Constitutional: He is oriented to person, place, and time. He appears listless. He appears cachectic. He appears toxic. He has a sickly appearance. He appears ill. He appears distressed.   Disheveled and dirty   HENT:   Head: Normocephalic and atraumatic.   Right Ear: External ear normal.   Left Ear: External ear normal.   Nose: Nose normal.   Mouth/Throat: Oropharynx is clear and moist. No oropharyngeal exudate.   Eyes: Conjunctivae and EOM are normal. Pupils are equal, round, and reactive to light. Right eye exhibits exudate. Right eye exhibits no discharge. Left eye exhibits no discharge. No scleral icterus.   Neck: Normal range of motion. Neck supple. No JVD present. No tracheal deviation present.   Cardiovascular: Normal rate, regular rhythm and intact distal pulses.    Pulmonary/Chest: Accessory muscle usage present. No stridor. Tachypnea noted. He is in respiratory distress. He has decreased breath sounds in the right lower field and the left lower field. He has wheezes in the right middle field. He exhibits no tenderness.    Abdominal: Soft. Bowel sounds are normal. He exhibits no distension and no mass. There is no tenderness.   Musculoskeletal: Normal range of motion. He exhibits no edema or tenderness.   Neurological: He is oriented to person, place, and time. He appears listless. No cranial nerve deficit or sensory deficit. GCS eye subscore is 4. GCS verbal subscore is 4. GCS motor subscore is 6.   Skin: Skin is warm and dry. No rash noted. He is not diaphoretic. No erythema. No pallor.   Psychiatric: Thought content normal. His affect is blunt. He is slowed.   Nursing note and vitals reviewed.       MDM  Number of Diagnoses or Management Options  Pneumonia of right middle lobe due to infectious organism: new and requires workup  Respiratory distress: new and requires workup  Diagnosis management comments: Patient had assisted ventilations in the field by EMS due to apnea and poor respiratory drive.  He was also placed on CPAP and improved upon arrival in ED was breathing on his own and was awake but sluggish.  Patient was found to have a pneumonia and felt to be in septic shock.  Septic shock protocol was initiated with fluids and antibiotic coverage per protocol.   Patient improved while in the emergency department was admitted to the hospital by pulmonary/intensivist.       Amount and/or Complexity of Data Reviewed  Clinical lab tests: ordered and reviewed  Tests in the radiology section of CPT??: ordered and reviewed  Tests in the medicine section of CPT??: ordered and reviewed      ED Course       Procedures

## 2016-06-28 NOTE — Progress Notes (Addendum)
Off BIPAP, placed on AirVo 45L @ 40%.

## 2016-06-28 NOTE — Progress Notes (Cosign Needed)
Patient in CCU room 3304. Dual skin assessment completed with Amedeo GoryHarelda, RN. Patient has red/open/excoriated bottom/perineal area and scattered scabs across body. Will consult wound care. Patient is alert to person and self. BIPAP 40%, Sp02 96%.  Complete chlorhexidine bed bath given. Patient has no complaints of pain. BP 111/65, HR 104.

## 2016-06-28 NOTE — Progress Notes (Signed)
Patient's wallet sent to safe.

## 2016-06-28 NOTE — Progress Notes (Signed)
Spoke with ER and looked at his backside and extensive abrasions, poor skin -- stage III. See images. Will get wound care to see patient. This likely explains thrombocytopenia as well. Will cancel hematology consult    Brett Deak Mcarthur RossettiJalil Gentle Hoge, MD

## 2016-06-28 NOTE — Progress Notes (Signed)
Patient beginning to get very agitated and having tremors, 1 mg Ativan given IV.

## 2016-06-28 NOTE — ED Notes (Signed)
TRANSFER - OUT REPORT:    Verbal report given to Katie, RN on Ina KickJimmy Borchers  being transferred to 3304 for routine progression of care       Report consisted of patient???s Situation, Background, Assessment and   Recommendations(SBAR).     Information from the following report(s) ED Summary was reviewed with the receiving nurse.    Lines:   Peripheral IV 10-18-16 Left Antecubital (Active)   Site Assessment Clean, dry, & intact 07-02-16  1:46 AM   Phlebitis Assessment 0 07-02-16  1:46 AM   Infiltration Assessment 0 07-02-16  1:46 AM   Dressing Status Clean, dry, & intact 07-02-16  1:46 AM   Dressing Type Transparent;Tape 07-02-16  1:46 AM   Hub Color/Line Status Green 07-02-16  1:46 AM       Peripheral IV 10-18-16 Right Antecubital (Active)   Site Assessment Clean, dry, & intact 07-02-16  2:23 AM   Phlebitis Assessment 0 07-02-16  2:23 AM   Infiltration Assessment 0 07-02-16  2:23 AM   Dressing Status Clean, dry, & intact 07-02-16  2:23 AM   Dressing Type Transparent;Tape 07-02-16  2:23 AM   Hub Color/Line Status Pink 07-02-16  2:23 AM        Opportunity for questions and clarification was provided.      Patient transported with:   Monitor  Registered Nurse

## 2016-06-28 NOTE — Progress Notes (Signed)
SPEECH PATHOLOGY NOTE:    Speech consult received and appreciated. Bedside swallowing evaluation attempted this AM; however patient on BiPap. ST to re-attempt at later date/time as patient is able to participate in po trials.     Mickel DuhamelShannon Batson, MSP, CCC-SLP, CBIS

## 2016-06-28 NOTE — ED Notes (Signed)
Pt medicated for agitation and was pulling at his mask.  Resp has come down to adjust the mask also.  Pt is awaiting a room assignment.

## 2016-06-29 ENCOUNTER — Inpatient Hospital Stay: Admit: 2016-06-29 | Payer: MEDICAID | Primary: Family Medicine

## 2016-06-29 LAB — EKG, 12 LEAD, INITIAL
Atrial Rate: 113 {beats}/min
Calculated P Axis: -107 degrees
Calculated R Axis: 81 degrees
Calculated T Axis: -139 degrees
P-R Interval: 80 ms
Q-T Interval: 356 ms
QRS Duration: 64 ms
QTC Calculation (Bezet): 472 ms
Ventricular Rate: 106 {beats}/min

## 2016-06-29 LAB — CBC WITH AUTOMATED DIFF
ABS. BASOPHILS: 0 10*3/uL (ref 0.0–0.2)
ABS. EOSINOPHILS: 0 10*3/uL (ref 0.0–0.8)
ABS. IMM. GRANS.: 0 10*3/uL (ref 0.0–0.5)
ABS. LYMPHOCYTES: 0.3 10*3/uL — ABNORMAL LOW (ref 0.5–4.6)
ABS. MONOCYTES: 0.2 10*3/uL (ref 0.1–1.3)
ABS. NEUTROPHILS: 5.4 10*3/uL (ref 1.7–8.2)
BASOPHILS: 0 % (ref 0.0–2.0)
EOSINOPHILS: 0 % — ABNORMAL LOW (ref 0.5–7.8)
HCT: 34.6 % — ABNORMAL LOW (ref 41.1–50.3)
HGB: 12.7 g/dL — ABNORMAL LOW (ref 13.6–17.2)
IMMATURE GRANULOCYTES: 0.7 % (ref 0.0–5.0)
LYMPHOCYTES: 5 % — ABNORMAL LOW (ref 13–44)
MCH: 33 PG — ABNORMAL HIGH (ref 26.1–32.9)
MCHC: 36.7 g/dL — ABNORMAL HIGH (ref 31.4–35.0)
MCV: 89.9 FL (ref 79.6–97.8)
MONOCYTES: 3 % — ABNORMAL LOW (ref 4.0–12.0)
MPV: 11.5 FL (ref 10.8–14.1)
NEUTROPHILS: 91 % — ABNORMAL HIGH (ref 43–78)
PLATELET: 81 10*3/uL — ABNORMAL LOW (ref 150–450)
RBC: 3.85 M/uL — ABNORMAL LOW (ref 4.23–5.67)
RDW: 12 % (ref 11.9–14.6)
WBC: 5.9 10*3/uL (ref 4.3–11.1)

## 2016-06-29 LAB — LACTIC ACID: Lactic acid: 2.6 MMOL/L — CR (ref 0.4–2.0)

## 2016-06-29 LAB — BLOOD GAS, ARTERIAL
ALLENS TEST: POSITIVE
ALLENS TEST: POSITIVE
Arterial O2 Hgb: 95 % (ref 94.0–97.0)
Arterial O2 Hgb: 87.7 % — ABNORMAL LOW (ref 94.0–97.0)
BASE DEFICIT: 12.2 mmol/L — ABNORMAL HIGH (ref 0–2)
BASE DEFICIT: 15.6 mmol/L — ABNORMAL HIGH (ref 0–2)
BICARBONATE: 12 mmol/L — ABNORMAL LOW (ref 22.0–26.0)
BICARBONATE: 10 mmol/L — ABNORMAL LOW (ref 22.0–26.0)
CARBOXYHEMOGLOBIN: 0.3 % — ABNORMAL LOW (ref 0.5–1.5)
CARBOXYHEMOGLOBIN: 0.5 % (ref 0.5–1.5)
DEOXYHEMOGLOBIN: 4 % (ref 0.0–5.0)
DEOXYHEMOGLOBIN: 11 % — ABNORMAL HIGH (ref 0.0–5.0)
FIO2: 45 %
METHEMOGLOBIN: 0.7 % (ref 0.0–1.5)
METHEMOGLOBIN: 0.6 % (ref 0.0–1.5)
O2 SAT: 96 % (ref 92.0–98.5)
O2 SAT: 89 % — ABNORMAL LOW (ref 92.0–98.5)
PCO2: 24 mmHg — ABNORMAL LOW (ref 35.0–45.0)
PCO2: 26 mmHg — ABNORMAL LOW (ref 35.0–45.0)
PO2: 97 mmHg (ref 75.0–100.0)
PO2: 67 mmHg — ABNORMAL LOW (ref 75.0–100.0)
RATE: 16
TOTAL HEMOGLOBIN: 11.7 GM/DL (ref 11.7–15.0)
TOTAL HEMOGLOBIN: 12.4 GM/DL (ref 11.7–15.0)
pH: 7.32 — ABNORMAL LOW (ref 7.35–7.45)
pH: 7.22 — CL (ref 7.35–7.45)

## 2016-06-29 LAB — METABOLIC PANEL, BASIC
Anion gap: 19 mmol/L — ABNORMAL HIGH (ref 7–16)
Anion gap: 21 mmol/L — ABNORMAL HIGH (ref 7–16)
Anion gap: 20 mmol/L — ABNORMAL HIGH (ref 7–16)
Anion gap: 17 mmol/L — ABNORMAL HIGH (ref 7–16)
BUN: 32 MG/DL — ABNORMAL HIGH (ref 6–23)
BUN: 34 MG/DL — ABNORMAL HIGH (ref 6–23)
BUN: 36 MG/DL — ABNORMAL HIGH (ref 6–23)
BUN: 37 MG/DL — ABNORMAL HIGH (ref 6–23)
CO2: 15 mmol/L — ABNORMAL LOW (ref 21–32)
CO2: 13 mmol/L — ABNORMAL LOW (ref 21–32)
CO2: 13 mmol/L — ABNORMAL LOW (ref 21–32)
CO2: 15 mmol/L — ABNORMAL LOW (ref 21–32)
Calcium: 7.2 MG/DL — ABNORMAL LOW (ref 8.3–10.4)
Calcium: 7.6 MG/DL — ABNORMAL LOW (ref 8.3–10.4)
Calcium: 7.4 MG/DL — ABNORMAL LOW (ref 8.3–10.4)
Calcium: 7.5 MG/DL — ABNORMAL LOW (ref 8.3–10.4)
Chloride: 91 mmol/L — ABNORMAL LOW (ref 98–107)
Chloride: 92 mmol/L — ABNORMAL LOW (ref 98–107)
Chloride: 95 mmol/L — ABNORMAL LOW (ref 98–107)
Chloride: 99 mmol/L (ref 98–107)
Creatinine: 1.2 MG/DL (ref 0.8–1.5)
Creatinine: 1.23 MG/DL (ref 0.8–1.5)
Creatinine: 1.2 MG/DL (ref 0.8–1.5)
Creatinine: 1.32 MG/DL (ref 0.8–1.5)
GFR est AA: >60 mL/min/{1.73_m2} (ref >60)
GFR est AA: >60 mL/min/{1.73_m2} (ref >60)
GFR est AA: >60 mL/min/{1.73_m2} (ref >60)
GFR est AA: >60 mL/min/{1.73_m2} (ref >60)
GFR est non-AA: >60 mL/min/{1.73_m2} (ref >60)
GFR est non-AA: >60 mL/min/{1.73_m2} (ref >60)
GFR est non-AA: >60 mL/min/{1.73_m2} (ref >60)
GFR est non-AA: 60 mL/min/{1.73_m2} — ABNORMAL LOW (ref >60)
Glucose: 118 mg/dL — ABNORMAL HIGH (ref 65–100)
Glucose: 113 mg/dL — ABNORMAL HIGH (ref 65–100)
Glucose: 135 mg/dL — ABNORMAL HIGH (ref 65–100)
Glucose: 158 mg/dL — ABNORMAL HIGH (ref 65–100)
Potassium: 3.8 mmol/L (ref 3.5–5.1)
Potassium: 3.8 mmol/L (ref 3.5–5.1)
Potassium: 3.7 mmol/L (ref 3.5–5.1)
Potassium: 3.7 mmol/L (ref 3.5–5.1)
Sodium: 125 mmol/L — ABNORMAL LOW (ref 136–145)
Sodium: 126 mmol/L — ABNORMAL LOW (ref 136–145)
Sodium: 128 mmol/L — ABNORMAL LOW (ref 136–145)
Sodium: 131 mmol/L — ABNORMAL LOW (ref 136–145)

## 2016-06-29 LAB — PHOSPHORUS: Phosphorus: 4.6 MG/DL — ABNORMAL HIGH (ref 2.5–4.5)

## 2016-06-29 LAB — MAGNESIUM: Magnesium: 1.9 mg/dL (ref 1.8–2.4)

## 2016-06-29 MED ORDER — METOPROLOL TARTRATE 5 MG/5 ML IV SOLN
5 mg/ mL | INTRAVENOUS | Status: DC | PRN
Start: 2016-06-29 — End: 2016-07-09
  Administered 2016-06-29 – 2016-07-08 (×6): via INTRAVENOUS

## 2016-06-29 MED ORDER — SODIUM ACETATE 2 MEQ/ML IV
2 mEq/mL | INTRAVENOUS | Status: DC
Start: 2016-06-29 — End: 2016-07-01
  Administered 2016-06-29 – 2016-07-01 (×4): via INTRAVENOUS

## 2016-06-29 MED ORDER — LORAZEPAM 2 MG/ML IJ SOLN
2 mg/mL | INTRAMUSCULAR | Status: DC | PRN
Start: 2016-06-29 — End: 2016-07-08
  Administered 2016-06-29 – 2016-07-08 (×8): via INTRAVENOUS

## 2016-06-29 MED ORDER — LORAZEPAM 2 MG/ML IJ SOLN
2 mg/mL | Freq: Four times a day (QID) | INTRAMUSCULAR | Status: DC | PRN
Start: 2016-06-29 — End: 2016-06-29

## 2016-06-29 MED FILL — IPRATROPIUM-ALBUTEROL 2.5 MG-0.5 MG/3 ML NEB SOLUTION: 2.5 mg-0.5 mg/3 ml | RESPIRATORY_TRACT | Qty: 3

## 2016-06-29 MED FILL — DEXTROSE 5% IN WATER (D5W) IV: INTRAVENOUS | Qty: 1000

## 2016-06-29 MED FILL — METOPROLOL TARTRATE 5 MG/5 ML IV SOLN: 5 mg/ mL | INTRAVENOUS | Qty: 5

## 2016-06-29 MED FILL — FAMOTIDINE (PF) 20 MG/2 ML IV: 20 mg/2 mL | INTRAVENOUS | Qty: 2

## 2016-06-29 MED FILL — BUDESONIDE 0.5 MG/2 ML NEB SUSPENSION: 0.5 mg/2 mL | RESPIRATORY_TRACT | Qty: 1

## 2016-06-29 MED FILL — LORAZEPAM 2 MG/ML IJ SOLN: 2 mg/mL | INTRAMUSCULAR | Qty: 1

## 2016-06-29 MED FILL — SOLU-MEDROL (PF) 125 MG/2 ML SOLUTION FOR INJECTION: 125 mg/2 mL | INTRAMUSCULAR | Qty: 2

## 2016-06-29 MED FILL — PIPERACILLIN-TAZOBACTAM 3.375 GRAM IV SOLR: 3.375 gram | INTRAVENOUS | Qty: 3.38

## 2016-06-29 MED FILL — NICOTINE 14 MG/24 HR DAILY PATCH: 14 mg/24 hr | TRANSDERMAL | Qty: 1

## 2016-06-29 MED FILL — SODIUM CHLORIDE 0.9 % IV: INTRAVENOUS | Qty: 1000

## 2016-06-29 MED FILL — VANCOMYCIN IN 0.9 % SODIUM CHLORIDE 1.25 GRAM/250 ML IV: 1.25 gram/250 mL | INTRAVENOUS | Qty: 250

## 2016-06-29 NOTE — Progress Notes (Signed)
ABG results phoned to Dr. Criselda PeachesMullen. No new orders at this time. Will hold off on Morphine for now, as severe rash is listed as an allergic reaction to codeine.

## 2016-06-29 NOTE — Progress Notes (Signed)
Patient on 60% BIPAP. HR 132. BP 98/66. Dr Criselda PeachesMullen aware of increased HR, no orders received at this time. Will continue to monitor patient.

## 2016-06-29 NOTE — Progress Notes (Signed)
Called RRT for sats in mid 80s. Elevated vital signs again. Updated Dr. Criselda PeachesMullen.     Orders received for ABG and morphine 2 mg ivp q 4 hr prn for tachypnea.

## 2016-06-29 NOTE — Other (Signed)
Interdisciplinary team rounds were held 06/29/2016 with the following team members:Nursing, Nurse Practitioner, Palliative Care, Pastoral Care, Pharmacy, Physical Therapy, Physician, Physician's Assistant, Respiratory Therapy and Clinical Coordinator and the patient.    Plan of care discussed. See clinical pathway and/or care plan for interventions and desired outcomes.

## 2016-06-29 NOTE — Progress Notes (Signed)
SPEECH PATHOLOGY NOTE:    Attempted to see patient for bedside swallow evaluation this AM. Patient lethargic. Unable to around despite tactile and verbal stim. Per nursing report, patient recently received ativan. No po trials presented. Will re-attempt at later time/date as patient's level of arousal improves.     Mickel DuhamelShannon Batson, MSP, CCC-SLP, CBIS

## 2016-06-29 NOTE — Progress Notes (Signed)
Dr Criselda PeachesMullen notified of ABG results: pH 7.22, Bicarb 10.     Telephone order received for 150 mEq Sodium Bicarb gtt, Pharmacy states that they do not have it available, so Sodium Acetate ordered in it's place.

## 2016-06-29 NOTE — Progress Notes (Signed)
Spoke to Dr Criselda PeachesMullen about patient's increase in HR and BP, Telephone order received for 5mg  Lopressor IV q 4 hours as needed to keep HR below 130. Patient on 60% BIPAP, confused, ABG also ordered.     HR 141, BP 150/79

## 2016-06-29 NOTE — Progress Notes (Signed)
Bedside report given to Katie, RN

## 2016-06-29 NOTE — Progress Notes (Signed)
Pt assessed. Briefly opens eyes to stimulus; obtunded. No command following. Pupils are equal and reactive. Currently respirations are even and unlabored with BIPAP in place. HR 90s, sinus rhythm. BP 146/95.

## 2016-06-29 NOTE — Progress Notes (Signed)
Pt switched back to BIPAP from AIRVO.  SATs were dipping below 90%.  Pt WOB was increased - Pt placed back on BIPAP.  Dr. Criselda PeachesMullen made aware.

## 2016-06-29 NOTE — Progress Notes (Signed)
Patient placed on 45L AirVo. Patient is agitated, tachycardic (HR 122), experiencing tremors, and pulling 02 off. 1mg  Ativan given.

## 2016-06-29 NOTE — Progress Notes (Signed)
Patient starting to become agitated with slight tremors, sinus tach with HR previously in lower 100s now increased to 120s.  PRN Ativan 1 mg administered.

## 2016-06-29 NOTE — Wound Image (Signed)
Bilateral buttocks and perianal area with IAD, patient has diffuse patches of partial thickness skin loss and denuded skin with erythema that is very dark red to purple, concern for yeast infection as well, zinc based barrier cream and antifungal ointment applied with adaptic gauze to help adhere.  Patient had thick dark stool (tar like) that was cleansed during exam using perispray.  Will monitor.

## 2016-06-29 NOTE — Progress Notes (Signed)
Bedside and Verbal shift change report given to Trula Orehristina, Charity fundraiserN (Cabin crewoncoming nurse) by Florentina AddisonKatie, RN (offgoing nurse). Report included the following information SBAR, Kardex, ED Summary, Procedure Summary, Intake/Output, MAR, Recent Results and Cardiac Rhythm Sinus Tachycardia.     Skin assessment completed. New orders discussed

## 2016-06-29 NOTE — Progress Notes (Addendum)
Respirations up to 32 bpm. O2 sat 89-90%. HR up to 120. BP 159/104. Pt slightly tremulous. Some moaning, denies pain. Called pharmacy to reload Ativan. Will reevaluate once given.

## 2016-06-29 NOTE — Progress Notes (Signed)
Critical Care Daily Progress Note: 06/29/2016    Brett Becker   Admission Date: 2016/07/16         The patient's chart is reviewed and the patient is discussed with the staff.       Subjective:     55 y/o with male admitted with COPD exacerbation, respiratory failure and etoh abuse.  Was off  bipap  And placed on airvo earlier.  Now back on bipap    Current Facility-Administered Medications   Medication Dose Route Frequency   ??? LORazepam (ATIVAN) injection 0.5 mg  0.5 mg IntraVENous Q3H PRN   ??? sodium chloride (NS) flush 5-10 mL  5-10 mL IntraVENous PRN   ??? 0.9% sodium chloride infusion  75 mL/hr IntraVENous CONTINUOUS   ??? sodium chloride (NS) flush 5-10 mL  5-10 mL IntraVENous Q8H   ??? sodium chloride (NS) flush 5-10 mL  5-10 mL IntraVENous PRN   ??? famotidine (PF) (PEPCID) 20 mg in sodium chloride 0.9 % 10 mL injection  20 mg IntraVENous Q12H   ??? nicotine (NICODERM CQ) 14 mg/24 hr patch 1 Patch  1 Patch TransDERmal DAILY   ??? albuterol-ipratropium (DUO-NEB) 2.5 MG-0.5 MG/3 ML  3 mL Nebulization Q4H RT   ??? budesonide (PULMICORT) 500 mcg/2 ml nebulizer suspension  500 mcg Nebulization BID RT   ??? vancomycin (VANCOCIN) 1250 mg in NS 250 ml infusion  1,250 mg IntraVENous Q24H   ??? piperacillin-tazobactam (ZOSYN) 3.375 g in 0.9% sodium chloride (MBP/ADV) 100 mL  3.375 g IntraVENous Q8H   ??? methylPREDNISolone (PF) (SOLU-MEDROL) injection 60 mg  60 mg IntraVENous Q8H       Review of Systems   Unobtainable due to patient status.        Objective:     Vitals:    06/29/16 1000 06/29/16 1031 06/29/16 1128 06/29/16 1130   BP: (!) 158/92 (!) 157/93     Pulse: (!) 118 (!) 120     Resp: 29 29     Temp:       SpO2: 92% 93% 92% 93%   Weight:       Height:           Intake and Output:   07/16 1901 - 07/18 0700  In: 2919 [I.V.:2919]  Out: 1660 [Urine:1660]       Physical Exam:          Constitutional:  the patient is on bipap   EENMT:  Sclera clear, pupils equal, oral mucosa moist  Respiratory: rhonchi   Cardiovascular:  RRR without M,G,R  Gastrointestinal: soft and non-tender; with positive bowel sounds.  Musculoskeletal: warm without cyanosis. There is no lower leg edema.  Skin:  no jaundice or rashes, no wounds   Neurologic: no gross neuro deficits     Psychiatric:  Lethargic but answers some questions    LINES:  peripheral    DRIPS:   none    CXR:       LAB  No results for input(s): GLUCPOC in the last 72 hours.    No lab exists for component: [facility]   Recent Labs      06/29/16   0610  July 16, 2016   1001  2016/07/16   0119   WBC  5.9  6.0  9.1   HGB  12.7*  12.5*  14.2   HCT  34.6*  33.7*  37.6*   PLT  81*  52*  65*     Recent Labs      06/29/16  [id number]  05-09-16   2340  05-09-16   1840  05-09-16   1001  05-09-16   0119   NA  126*  125*  123*  118*  125*   K  3.8  3.8  3.8  4.0  4.1   CL  92*  91*  88*  83*  74*   CO2  13*  15*  16*  17*  15*   GLU  113*  118*  107*  121*  86   BUN  34*  32*  29*  26*  18   CREA  1.23  1.20  1.27  1.38  1.74*   MG  1.9   --    --   2.1  1.7*   PHOS  4.6*   --    --   5.9*   --    CA  7.6*  7.2*  7.2*  7.2*  8.1*   TROIQ   --    --    --    --   1.22*   ALB   --    --    --    --   2.7*   TBILI   --    --    --    --   1.6*   ALT   --    --    --    --   80*   SGOT   --    --    --    --   186*     Recent Labs      06/29/16   0405  05-09-16   0412  05-09-16   0115   PH  7.32*  7.33*  7.21*   PCO2  24*  22*  25*   PO2  97  105*  107*   HCO3  12*  12*  10*     Recent Labs      06/29/16   0926  05-09-16   1001   LAC  2.6*  3.0*       Assessment:  (Medical Decision Making)     Hospital Problems  Date Reviewed: 2016/11/02          Codes Class Noted POA    CAP (community acquired pneumonia) ICD-10-CM: J18.9  ICD-9-CM: 486  06/29/2016 Unknown    Worse on cxr    COPD exacerbation (HCC) ICD-10-CM: J44.1  ICD-9-CM: 491.21  2016/11/02 Yes    Ongoing    Lactic acidosis ICD-10-CM: E87.2  ICD-9-CM: 276.2  2016/11/02 Unknown        ETOH abuse ICD-10-CM: F10.10  ICD-9-CM: 305.00  2016/11/02 Unknown         * (Principal)Acute hypoxemic respiratory failure (HCC) ICD-10-CM: J96.01  ICD-9-CM: 518.81  2016/11/02 Unknown    On bipap    Protein calorie malnutrition (HCC) ICD-10-CM: E46  ICD-9-CM: 263.9  2016/11/02 Unknown        Elevated troponin ICD-10-CM: R74.8  ICD-9-CM: 790.6  2016/11/02 Unknown        AKI (acute kidney injury) (HCC) ICD-10-CM: N17.9  ICD-9-CM: 584.9  2016/11/02 Unknown    Cr 1.38    Thrombocytopenia (HCC) ICD-10-CM: D69.6  ICD-9-CM: 287.5  2016/11/02 Unknown        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    [name], MD

## 2016-06-29 NOTE — Progress Notes (Addendum)
Ativan given. Respirations down to 24 bpm. O2 sat 92%. HR 109. BP 139/95.

## 2016-06-29 NOTE — Progress Notes (Signed)
Bedside and Verbal shift change report given to Florentina AddisonKatie, Charity fundraiserN (Cabin crewoncoming nurse) by Marcelino DusterMichelle, RN (offgoing nurse). Report included the following information SBAR, Kardex, ED Summary, Procedure Summary, Intake/Output, MAR, Recent Results, Med Rec Status and Cardiac Rhythm Sinus Tachycardia.     Patient turned, skin assessed. Patient on BIPAP, agitated when turned.

## 2016-06-30 ENCOUNTER — Inpatient Hospital Stay: Admit: 2016-06-30 | Payer: MEDICAID | Primary: Family Medicine

## 2016-06-30 LAB — CBC WITH AUTOMATED DIFF
ABS. BASOPHILS: 0 10*3/uL (ref 0.0–0.2)
ABS. EOSINOPHILS: 0 10*3/uL (ref 0.0–0.8)
ABS. IMM. GRANS.: 0 10*3/uL (ref 0.0–0.5)
ABS. LYMPHOCYTES: 0.2 10*3/uL — ABNORMAL LOW (ref 0.5–4.6)
ABS. MONOCYTES: 0.3 10*3/uL (ref 0.1–1.3)
ABS. NEUTROPHILS: 4.7 10*3/uL (ref 1.7–8.2)
BASOPHILS: 0 % (ref 0.0–2.0)
EOSINOPHILS: 0 % — ABNORMAL LOW (ref 0.5–7.8)
HCT: 29 % — ABNORMAL LOW (ref 41.1–50.3)
HGB: 10.5 g/dL — ABNORMAL LOW (ref 13.6–17.2)
IMMATURE GRANULOCYTES: 0.2 % (ref 0.0–5.0)
LYMPHOCYTES: 4 % — ABNORMAL LOW (ref 13–44)
MCH: 32.3 PG (ref 26.1–32.9)
MCHC: 36.2 g/dL — ABNORMAL HIGH (ref 31.4–35.0)
MCV: 89.2 FL (ref 79.6–97.8)
MONOCYTES: 5 % (ref 4.0–12.0)
MPV: 10.8 FL (ref 10.8–14.1)
NEUTROPHILS: 91 % — ABNORMAL HIGH (ref 43–78)
PLATELET: 82 10*3/uL — ABNORMAL LOW (ref 150–450)
RBC: 3.25 M/uL — ABNORMAL LOW (ref 4.23–5.67)
RDW: 12.2 % (ref 11.9–14.6)
WBC: 5.2 10*3/uL (ref 4.3–11.1)

## 2016-06-30 LAB — BLOOD GAS, ARTERIAL
ALLENS TEST: POSITIVE
ALLENS TEST: POSITIVE
ALLENS TEST: POSITIVE
Arterial O2 Hgb: 88.1 % — ABNORMAL LOW (ref 94.0–97.0)
Arterial O2 Hgb: 89.3 % — ABNORMAL LOW (ref 94.0–97.0)
Arterial O2 Hgb: 96.2 % (ref 94.0–97.0)
BASE DEFICIT: 9.3 mmol/L — ABNORMAL HIGH (ref 0–2)
BASE DEFICIT: 5.9 mmol/L — ABNORMAL HIGH (ref 0–2)
BASE DEFICIT: 2.1 mmol/L — ABNORMAL HIGH (ref 0–2)
BICARBONATE: 15 mmol/L — ABNORMAL LOW (ref 22.0–26.0)
BICARBONATE: 18 mmol/L — ABNORMAL LOW (ref 22.0–26.0)
BICARBONATE: 22 mmol/L (ref 22.0–26.0)
CARBOXYHEMOGLOBIN: 0.3 % — ABNORMAL LOW (ref 0.5–1.5)
CARBOXYHEMOGLOBIN: 0.2 % — ABNORMAL LOW (ref 0.5–1.5)
CARBOXYHEMOGLOBIN: 0.3 % — ABNORMAL LOW (ref 0.5–1.5)
DEOXYHEMOGLOBIN: 11 % — ABNORMAL HIGH (ref 0.0–5.0)
DEOXYHEMOGLOBIN: 10 % — ABNORMAL HIGH (ref 0.0–5.0)
DEOXYHEMOGLOBIN: 3 % (ref 0.0–5.0)
FIO2: 100 %
METHEMOGLOBIN: 0.6 % (ref 0.0–1.5)
METHEMOGLOBIN: 0.6 % (ref 0.0–1.5)
METHEMOGLOBIN: 0.5 % (ref 0.0–1.5)
O2 SAT: 89 % — ABNORMAL LOW (ref 92.0–98.5)
O2 SAT: 90 % — ABNORMAL LOW (ref 92.0–98.5)
O2 SAT: 97 % (ref 92.0–98.5)
PCO2: 30 mmHg — ABNORMAL LOW (ref 35.0–45.0)
PCO2: 32 mmHg — ABNORMAL LOW (ref 35.0–45.0)
PCO2: 37 mmHg (ref 35.0–45.0)
PEEP/CPAP: 10
PO2: 62 mmHg — ABNORMAL LOW (ref 75.0–100.0)
PO2: 62 mmHg — ABNORMAL LOW (ref 75.0–100.0)
PO2: 104 mmHg — ABNORMAL HIGH (ref 75.0–100.0)
RATE: 16
RATE: 16
RATE: 20
TOTAL HEMOGLOBIN: 11.7 GM/DL (ref 11.7–15.0)
TOTAL HEMOGLOBIN: 11.3 GM/DL — ABNORMAL LOW (ref 11.7–15.0)
TOTAL HEMOGLOBIN: 11.2 GM/DL — ABNORMAL LOW (ref 11.7–15.0)
Tidal volume: 400
pH: 7.33 — ABNORMAL LOW (ref 7.35–7.45)
pH: 7.38 (ref 7.35–7.45)
pH: 7.4 (ref 7.35–7.45)

## 2016-06-30 LAB — METABOLIC PANEL, BASIC
Anion gap: 17 mmol/L — ABNORMAL HIGH (ref 7–16)
Anion gap: 13 mmol/L (ref 7–16)
Anion gap: 10 mmol/L (ref 7–16)
Anion gap: 11 mmol/L (ref 7–16)
BUN: 37 MG/DL — ABNORMAL HIGH (ref 6–23)
BUN: 37 MG/DL — ABNORMAL HIGH (ref 6–23)
BUN: 35 MG/DL — ABNORMAL HIGH (ref 6–23)
BUN: 35 MG/DL — ABNORMAL HIGH (ref 6–23)
CO2: 17 mmol/L — ABNORMAL LOW (ref 21–32)
CO2: 22 mmol/L (ref 21–32)
CO2: 25 mmol/L (ref 21–32)
CO2: 27 mmol/L (ref 21–32)
Calcium: 7.4 MG/DL — ABNORMAL LOW (ref 8.3–10.4)
Calcium: 6.9 MG/DL — ABNORMAL LOW (ref 8.3–10.4)
Calcium: 7.5 MG/DL — ABNORMAL LOW (ref 8.3–10.4)
Calcium: 7.6 MG/DL — ABNORMAL LOW (ref 8.3–10.4)
Chloride: 98 mmol/L (ref 98–107)
Chloride: 99 mmol/L (ref 98–107)
Chloride: 97 mmol/L — ABNORMAL LOW (ref 98–107)
Chloride: 96 mmol/L — ABNORMAL LOW (ref 98–107)
Creatinine: 1.22 MG/DL (ref 0.8–1.5)
Creatinine: 1.21 MG/DL (ref 0.8–1.5)
Creatinine: 1.34 MG/DL (ref 0.8–1.5)
Creatinine: 1.39 MG/DL (ref 0.8–1.5)
GFR est AA: >60 mL/min/{1.73_m2} (ref >60)
GFR est AA: >60 mL/min/{1.73_m2} (ref >60)
GFR est AA: >60 mL/min/{1.73_m2} (ref >60)
GFR est AA: >60 mL/min/{1.73_m2} (ref >60)
GFR est non-AA: >60 mL/min/{1.73_m2} (ref >60)
GFR est non-AA: >60 mL/min/{1.73_m2} (ref >60)
GFR est non-AA: 59 mL/min/{1.73_m2} — ABNORMAL LOW (ref >60)
GFR est non-AA: 56 mL/min/{1.73_m2} — ABNORMAL LOW (ref >60)
Glucose: 199 mg/dL — ABNORMAL HIGH (ref 65–100)
Glucose: 263 mg/dL — ABNORMAL HIGH (ref 65–100)
Glucose: 280 mg/dL — ABNORMAL HIGH (ref 65–100)
Glucose: 287 mg/dL — ABNORMAL HIGH (ref 65–100)
Potassium: 3.5 mmol/L (ref 3.5–5.1)
Potassium: 3.2 mmol/L — ABNORMAL LOW (ref 3.5–5.1)
Potassium: 3.5 mmol/L (ref 3.5–5.1)
Potassium: 3.3 mmol/L — ABNORMAL LOW (ref 3.5–5.1)
Sodium: 132 mmol/L — ABNORMAL LOW (ref 136–145)
Sodium: 134 mmol/L — ABNORMAL LOW (ref 136–145)
Sodium: 132 mmol/L — ABNORMAL LOW (ref 136–145)
Sodium: 134 mmol/L — ABNORMAL LOW (ref 136–145)

## 2016-06-30 LAB — GLUCOSE, POC: Glucose (POC): 342 mg/dL — ABNORMAL HIGH (ref 65–100)

## 2016-06-30 LAB — MAGNESIUM: Magnesium: 1.6 mg/dL — ABNORMAL LOW (ref 1.8–2.4)

## 2016-06-30 LAB — PHOSPHORUS: Phosphorus: 2.8 MG/DL (ref 2.5–4.5)

## 2016-06-30 MED ORDER — FENTANYL CITRATE (PF) 50 MCG/ML IJ SOLN
50 mcg/mL | Freq: Once | INTRAMUSCULAR | Status: AC
Start: 2016-06-30 — End: 2016-06-30
  Administered 2016-06-30: 17:00:00 via INTRAVENOUS

## 2016-06-30 MED ORDER — ETOMIDATE 2 MG/ML IV SOLN
2 mg/mL | INTRAVENOUS | Status: AC
Start: 2016-06-30 — End: 2016-07-01
  Administered 2016-06-30: 19:00:00

## 2016-06-30 MED ORDER — MAGNESIUM SULFATE 2 GRAM/50 ML IVPB
2 gram/50 mL (4 %) | Freq: Once | INTRAVENOUS | Status: AC
Start: 2016-06-30 — End: 2016-07-09
  Administered 2016-06-30: 10:00:00 via INTRAVENOUS

## 2016-06-30 MED ORDER — FENTANYL (PF) 25 MCG/ML IN 0.9 % SODIUM CHLORIDE INJECTION
25 mcg/mL | INTRAMUSCULAR | Status: DC
Start: 2016-06-30 — End: 2016-07-09
  Administered 2016-06-30 – 2016-07-09 (×35): via INTRAVENOUS

## 2016-06-30 MED ORDER — POTASSIUM CHLORIDE 20 MEQ/100 ML IV PIGGY BACK
20 mEq/100 mL | INTRAVENOUS | Status: AC
Start: 2016-06-30 — End: 2016-07-09
  Administered 2016-06-30 (×2): via INTRAVENOUS

## 2016-06-30 MED ORDER — ETOMIDATE 2 MG/ML IV SOLN
2 mg/mL | Freq: Once | INTRAVENOUS | Status: AC
Start: 2016-06-30 — End: 2016-06-30
  Administered 2016-06-30: 17:00:00 via INTRAVENOUS

## 2016-06-30 MED ORDER — POTASSIUM CHLORIDE 20 MEQ/100 ML IV PIGGY BACK
20 mEq/100 mL | INTRAVENOUS | Status: AC
Start: 2016-06-30 — End: 2016-07-09
  Administered 2016-06-30 (×2): via INTRAVENOUS

## 2016-06-30 MED ORDER — SODIUM CHLORIDE 0.9 % IV
5 mg/mL | INTRAVENOUS | Status: DC
Start: 2016-06-30 — End: 2016-07-09
  Administered 2016-06-30 – 2016-07-09 (×38): via INTRAVENOUS

## 2016-06-30 MED ORDER — INSULIN REGULAR HUMAN 100 UNIT/ML INJECTION
100 unit/mL | Freq: Four times a day (QID) | INTRAMUSCULAR | Status: DC
Start: 2016-06-30 — End: 2016-07-09
  Administered 2016-06-30 – 2016-07-09 (×33): via SUBCUTANEOUS

## 2016-06-30 MED ORDER — ALBUTEROL SULFATE HFA 90 MCG/ACTUATION AEROSOL INHALER
90 mcg/actuation | RESPIRATORY_TRACT | Status: DC
Start: 2016-06-30 — End: 2016-07-01
  Administered 2016-06-30 – 2016-07-01 (×7): via RESPIRATORY_TRACT

## 2016-06-30 MED ORDER — FENTANYL CITRATE (PF) 50 MCG/ML IJ SOLN
50 mcg/mL | INTRAMUSCULAR | Status: AC
Start: 2016-06-30 — End: 2016-07-01
  Administered 2016-06-30: 19:00:00

## 2016-06-30 MED ORDER — PHARMACY VANCOMYCIN NOTE
Status: DC | PRN
Start: 2016-06-30 — End: 2016-07-03

## 2016-06-30 MED ORDER — NUTRITIONAL PHARMACY SUPPORT ORDERS
Status: DC | PRN
Start: 2016-06-30 — End: 2016-07-09

## 2016-06-30 MED FILL — BUDESONIDE 0.5 MG/2 ML NEB SUSPENSION: 0.5 mg/2 mL | RESPIRATORY_TRACT | Qty: 1

## 2016-06-30 MED FILL — POTASSIUM CHLORIDE 20 MEQ/100 ML IV PIGGY BACK: 20 mEq/100 mL | INTRAVENOUS | Qty: 100

## 2016-06-30 MED FILL — FENTANYL (PF) 25 MCG/ML IN 0.9 % SODIUM CHLORIDE INJECTION: 25 mcg/mL | INTRAMUSCULAR | Qty: 100

## 2016-06-30 MED FILL — SOLU-MEDROL (PF) 125 MG/2 ML SOLUTION FOR INJECTION: 125 mg/2 mL | INTRAMUSCULAR | Qty: 2

## 2016-06-30 MED FILL — LORAZEPAM 2 MG/ML IJ SOLN: 2 mg/mL | INTRAMUSCULAR | Qty: 1

## 2016-06-30 MED FILL — METOPROLOL TARTRATE 5 MG/5 ML IV SOLN: 5 mg/ mL | INTRAVENOUS | Qty: 5

## 2016-06-30 MED FILL — IPRATROPIUM-ALBUTEROL 2.5 MG-0.5 MG/3 ML NEB SOLUTION: 2.5 mg-0.5 mg/3 ml | RESPIRATORY_TRACT | Qty: 3

## 2016-06-30 MED FILL — NICOTINE 14 MG/24 HR DAILY PATCH: 14 mg/24 hr | TRANSDERMAL | Qty: 1

## 2016-06-30 MED FILL — MAGNESIUM SULFATE 2 GRAM/50 ML IVPB: 2 gram/50 mL (4 %) | INTRAVENOUS | Qty: 50

## 2016-06-30 MED FILL — VANCOMYCIN IN 0.9 % SODIUM CHLORIDE 1.25 GRAM/250 ML IV: 1.25 gram/250 mL | INTRAVENOUS | Qty: 250

## 2016-06-30 MED FILL — PHARMACY VANCOMYCIN NOTE: Qty: 1

## 2016-06-30 MED FILL — AMIDATE 2 MG/ML INTRAVENOUS SOLUTION: 2 mg/mL | INTRAVENOUS | Qty: 20

## 2016-06-30 MED FILL — FAMOTIDINE (PF) 20 MG/2 ML IV: 20 mg/2 mL | INTRAVENOUS | Qty: 2

## 2016-06-30 MED FILL — NUTRITIONAL PHARMACY SUPPORT ORDERS: Qty: 1

## 2016-06-30 MED FILL — FENTANYL CITRATE (PF) 50 MCG/ML IJ SOLN: 50 mcg/mL | INTRAMUSCULAR | Qty: 2

## 2016-06-30 MED FILL — VENTOLIN HFA 90 MCG/ACTUATION AEROSOL INHALER: 90 mcg/actuation | RESPIRATORY_TRACT | Qty: 18

## 2016-06-30 MED FILL — MIDAZOLAM 5 MG/ML IJ SOLN: 5 mg/mL | INTRAMUSCULAR | Qty: 20

## 2016-06-30 MED FILL — DEXTROSE 5% IN WATER (D5W) IV: INTRAVENOUS | Qty: 1000

## 2016-06-30 MED FILL — PIPERACILLIN-TAZOBACTAM 3.375 GRAM IV SOLR: 3.375 gram | INTRAVENOUS | Qty: 3.38

## 2016-06-30 NOTE — Progress Notes (Signed)
Pt intubated by Dr. Waynette ButteryGreer on first attempt with a 7.5 ET tube at 23 cm at the teeth.

## 2016-06-30 NOTE — Progress Notes (Addendum)
Brett Becker  Admission Date: 09/14/2016             ICU Daily Progress Note: 06/30/2016     55 y/o with male admitted with COPD exacerbation, respiratory failure and etoh abuse.  On bipap.??    Subjective:     Remains on BiPAP. For over 24 hours. Hypertensive.   Lethargic.    Current Facility-Administered Medications   Medication Dose Route Frequency   ??? [START ON 07/01/2016] Vancomycin trough reminder   Other PRN   ??? LORazepam (ATIVAN) injection 0.5 mg  0.5 mg IntraVENous Q3H PRN   ??? metoprolol (LOPRESSOR) injection 5 mg  5 mg IntraVENous Q4H PRN   ??? sodium acetate 150 mEq in dextrose 5% 1,075 mL infusion  100 mL/hr IntraVENous CONTINUOUS   ??? sodium chloride (NS) flush 5-10 mL  5-10 mL IntraVENous Q8H   ??? sodium chloride (NS) flush 5-10 mL  5-10 mL IntraVENous PRN   ??? famotidine (PF) (PEPCID) 20 mg in sodium chloride 0.9 % 10 mL injection  20 mg IntraVENous Q12H   ??? nicotine (NICODERM CQ) 14 mg/24 hr patch 1 Patch  1 Patch TransDERmal DAILY   ??? albuterol-ipratropium (DUO-NEB) 2.5 MG-0.5 MG/3 ML  3 mL Nebulization Q4H RT   ??? budesonide (PULMICORT) 500 mcg/2 ml nebulizer suspension  500 mcg Nebulization BID RT   ??? vancomycin (VANCOCIN) 1250 mg in NS 250 ml infusion  1,250 mg IntraVENous Q24H   ??? piperacillin-tazobactam (ZOSYN) 3.375 g in 0.9% sodium chloride (MBP/ADV) 100 mL  3.375 g IntraVENous Q8H   ??? methylPREDNISolone (PF) (SOLU-MEDROL) injection 60 mg  60 mg IntraVENous Q8H         Objective:     Vitals:    06/30/16 0930 06/30/16 1000 06/30/16 1030 06/30/16 1100   BP: (!) 156/100 (!) 165/96 (!) 168/101    Pulse: (!) 110 (!) 107 (!) 112    Resp: (!) 34 (!) 34 (!) 34    Temp:       SpO2: (!) 88% (!) 87% (!) 85% 92%   Weight:       Height:         Intake and Output:   07/17 1901 - 07/19 0700  In: 4072.3 [I.V.:4072.3]  Out: 1560 [Urine:1560]       Physical Exam:          GEN: acutely ill, on BiPAP  HEENT:  on BIPAP   NECK:  no JVD, no retractions, no thyromegaly or masses,   LUNGS:  Clear anteriorly, decreased bases   HEART:  accelerated RRR with no M,G,R;  ABDOMEN:  pain on palpation; positive bowel sounds present  EXTREMITIES:  warm with no cyanosis, no lower leg edema  SKIN:  no jaundice or ecchymosis   NEURO:  lethargic but responding    LINES:  IV, foley  DRIPS: none  NUTRITION: npo on BiPAP    Ventilator Settings  Mode FIO2 Rate Tidal Volume Pressure PEEP      100 % (increased from 80% @1045 (02 sats 85-87% on 80%))                 Peak airway pressure:     Minute ventilation: 14.2 l/min       CHEST XRAY:       LAB  Recent Labs      06/30/16   0420  06/29/16   0610  07/12/2016   1001   WBC  5.2  5.9  6.0   HGB  10.5*  12.7*  12.5*   HCT  29.0*  34.6*  33.7*   PLT  82*  81*  52*     Recent Labs      06/30/16   0420  06/29/16   2244  06/29/16   1755   06/29/16   0610   2016-07-28   1001  July 28, 2016   0119   NA  134*  132*  131*   < >  126*   < >  118*  125*   K  3.2*  3.5  3.7   < >  3.8   < >  4.0  4.1   CL  99  98  99   < >  92*   < >  83*  74*   CO2  22  17*  15*   < >  13*   < >  17*  15*   GLU  263*  199*  158*   < >  113*   < >  121*  86   BUN  37*  37*  37*   < >  34*   < >  26*  18   CREA  1.21  1.22  1.32   < >  1.23   < >  1.38  1.74*   MG  1.6*   --    --    --   1.9   --   2.1  1.7*   PHOS  2.8   --    --    --   4.6*   --   5.9*   --    TROIQ   --    --    --    --    --    --    --   1.22*    < > = values in this interval not displayed.     Recent Labs      06/30/16   0430  06/29/16   2335  06/29/16   1719   PH  7.38  7.33*  7.22*   PCO2  32*  30*  26*   PO2  62*  62*  67*   HCO3  18*  15*  10*       Cultures: NTD    Assessment:     Patient Active Problem List   Diagnosis Code   ??? 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 Alcohol abuse F10.10   ??? HTN (hypertension) I10   ??? Gastric ulcer K25.9   ??? Hepatitis C B19.20   ??? 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 Hyponatremia E87.1   ??? COPD exacerbation (HCC) J44.1   ??? Hypoxia R09.02   ??? Underweight R63.6   ??? Lactic acidosis E87.2    ??? ETOH abuse F10.10   ??? Acute hypoxemic respiratory failure (HCC) J96.01   ??? Protein calorie malnutrition (HCC) E46   ??? Elevated troponin R74.8   ??? AKI (acute kidney injury) (HCC) N17.9   ??? Thrombocytopenia (HCC) D69.6   ??? CAP (community acquired pneumonia) J18.9       Plan     Hospital Problems  Date Reviewed: 07-28-2016          Codes Class Noted POA    CAP (community acquired pneumonia) ICD-10-CM: J18.9  ICD-9-CM: 486  06/29/2016 Unknown    On abx D 2    COPD exacerbation (HCC) ICD-10-CM: J44.1  ICD-9-CM: 491.21  July 27, 2016 Yes    BD, steorids    Lactic acidosis ICD-10-CM: E87.2  ICD-9-CM: 276.2  07-27-2016 Unknown        ETOH abuse ICD-10-CM: F10.10  ICD-9-CM: 305.00  07-27-2016 Unknown    Chronically.     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 On solumedrol and BD  -- abx for PNA: on vancomycina nd zosyn  -- nicoderm, banana bag, and ativan PRN for ETOH and tobacco abuse  -- metabolic acidosis on sodium acetate drip.   --Lipase, amylase, Xray. He may need U/S r/o other acute processes  -- hope to avoid intubation    Aram Candela, NP    More than 50% of time documented was spent in face-to-face contact with the patient and in the care of the patient on the floor/unit where the patient is located.       Lungs:  B rhonchi.   Heart:  RRR with no Murmur/Rubs/Gallops    Additional Comments:     Patient with worsening hypoxia. Now desaturating on bipap with 100%. Decision made to proceed with intubation (see separate note). Continue current abx.     I have spoken with and examined the patient. I agree with the above assessment and plan as documented.    Cecilio Asper, MD

## 2016-06-30 NOTE — Progress Notes (Signed)
Pt tachypneic, tachycardic, hypertensive, and restless.  Ativan given as ordered.

## 2016-06-30 NOTE — Interval H&P Note (Signed)
H&P Update:  Ina KickJimmy Ethington was seen and examined.  History and physical has been reviewed. The patient has been examined. There have been no significant clinical changes since the completion of the originally dated History and Physical.    Signed By: Cecilio Asperravis J Acie Custis, MD     June 30, 2016 1:43 PM

## 2016-06-30 NOTE — Progress Notes (Signed)
Physical Therapy Note:    Orders received. Attended interdisciplinary ICU rounds and RN reports patient currently not appropriate to participate in therapy at this time. Will hold today and check back as patient is able and schedule allows.    Thank you,  Rhys MartiniSarah W Joellen Tullos, DPT

## 2016-06-30 NOTE — Progress Notes (Signed)
Patient was intubated with a number 7.5 ET Tube. Tube placement verified by auscultation and ETCO2 monitor. ET Tube is secured at the 23 cm mark at the teeth and on the right side. Patient was intubated by Dr Waynette ButteryGreer on the 1 attempt. Breath sounds are coarse. Patient is Negative for subcutaneous air and chest excursion is symmetric. Trachea is midline.  Patient is also Negative for cyanosis and is Negative for pitting edema.  Patient placed on ventilator on documented settings. All alarms are set and audible. Resuscitation bag is at the head of the bed.      Ventilator Settings  Mode FIO2 Rate Tidal Volume Pressure PEEP I:E Ratio   PRVC  100 %    500 ml     10 cm H20  1:3.45      Peak airway pressure: 31.3 cm H2O   Minute ventilation: 7.8 l/min     ABG:   Recent Labs      06/30/16   0430  06/29/16   2335  06/29/16   1719   PH  7.38  7.33*  7.22*   PCO2  32*  30*  26*   PO2  62*  62*  67*   HCO3  18*  15*  10*

## 2016-06-30 NOTE — Progress Notes (Signed)
Pt will not open eyes to verbal or mild tactile stimulus.  Pt does squeeze and release hands on command.  Pt tachycardic, tachypneic, and hypertensive at this time.  Lung sounds are diminished.

## 2016-06-30 NOTE — Progress Notes (Signed)
SPEECH PATHOLOGY NOTE:  Patient remains on BiPap- not able to participate in speech therapy evaluation. ST to sign off at this time. Please re-consult when patient's medical status improves and is able to participate in ST.    Mickel DuhamelShannon Batson, MSP, CCC-SLP, CBIS

## 2016-06-30 NOTE — Progress Notes (Signed)
Spiritual Care visit. Initial Visit.    Patient did not respond.  Chaplain prayed and left a card.    Spiritual Care Assessment/Progress Notes    Brett Becker [id number]  [id number]    01-18-1961  55 y.o.  male    Patient Telephone Number: [phone] (home)   Religious Affiliation: [name]   Language: English   Extended Emergency Contact Information  Primary Emergency Contact: [facility] [place]  Home Phone: [phone]  Mobile Phone: [phone]  Relation: Daughter  Secondary Emergency Contact: [facility] [place]  Home Phone: [phone]  Relation: Other Relative   Patient Active Problem List    Diagnosis Date Noted   ??? CAP (community acquired pneumonia) 06/29/2016   ??? COPD exacerbation (HCC) 07-24-16   ??? Lactic acidosis 2016-07-24   ??? ETOH abuse July 24, 2016   ??? Acute hypoxemic respiratory failure (HCC) Jul 24, 2016   ??? Protein calorie malnutrition (HCC) July 24, 2016   ??? Elevated troponin 2016-07-24   ??? AKI (acute kidney injury) (HCC) 07-24-2016   ??? Thrombocytopenia (HCC) 07/24/2016   ??? Underweight 03/24/2015   ??? Hypoxia 03/21/2015   ??? 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 Hyponatremia 02/25/2015   ??? 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 Marijuana abuse 09/02/2013   ??? Gastric ulcer 08/31/2013   ??? Hepatitis C 08/31/2013   ??? HTN (hypertension) 08/30/2013   ??? 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 Alcohol abuse 08/28/2013        Date: 06/30/2016       Level of Religious/Spiritual Activity:           Involved in faith tradition/spiritual practice             Not involved in faith tradition/spiritual practice           Spiritually oriented             Claims no spiritual orientation             seeking spiritual identity           Feels alienated from religious practice/tradition           Feels angry about religious practice/tradition           Spirituality/religious tradition IS a resource for coping at this time.           Not able to assess due to medical condition     Services Provided Today:           crisis intervention             reading Scriptures           spiritual assessment             prayer           empathic listening/emotional support           rites and rituals (cite in comments)           life review              religious support           theological development            advocacy           ethical dialog              blessing           bereavement support             support  to family           anticipatory grief support            help with AMD           spiritual guidance             meditation      Spiritual Care Needs           Emotional Support           Spiritual/Religious Care           Loss/Adjustment           Advocacy/Referral                /Ethics           No needs expressed at               this time           Other: (note in               comments)  Spiritual Care Plan           Follow up visits with               pt/family           Provide materials           Schedule sacraments           Contact Community               Clergy           Follow up as needed           Other: (note in               comments)     Comments: Spiritual Assessment     Advance Directives  Legal Next of Kin remains as decision maker.  Category/Code Status Full.  Family Support  Yes.  Spiritual Support  [facility].    Brett Becker   Visit by Arelia Sneddonon Becker, M.Ed., Th.B. ,Staff Chaplain

## 2016-06-30 NOTE — Procedures (Signed)
Procedure: Emergent intubation (CPT 31500)    Indication: acute respiratory failure with hypoxia    Anesthesia:  Fentanyl 50mcg, Etomidate 30mg      After assessing the airway, the patient underwent preoxygenation with 100% FiO2 for 5 min. Fentanyl and etomidate was given sequentially.  After adequate sedation intubation was performed.    A 3.0 MAC blade laryngoscope was used to visualize the epiglottis and vocal chords.    After positive identification of the vocal chords, an 7.5 ET tube was placed into the trachea with direct visualization.    The tube was seen passing through the vocal chords without difficulty.  CO2 colorimetry was employed immediately to verify tube in airway with appropriate color change indicating detection of CO2.    Water vapor was seen within the ET tube, and auscultation of the abdomen revealed no bubbling sounds.    Auscultation and inspection of the chest after intubation showed symmetric chest excursion and symmetric air entry bilaterally.    The tube was secured at 24cm at the lip.    Chest X-ray has been ordered and is pending.    The patient has been placed on a mechanical ventilator.    There were no complications.    Cecilio Asperravis J Markeita Alicia, MD

## 2016-07-01 ENCOUNTER — Inpatient Hospital Stay: Admit: 2016-07-01 | Payer: MEDICAID | Primary: Family Medicine

## 2016-07-01 LAB — BLOOD GAS, ARTERIAL
ALLENS TEST: POSITIVE
ALLENS TEST: POSITIVE
Arterial O2 Hgb: 97.9 % — ABNORMAL HIGH (ref 94.0–97.0)
Arterial O2 Hgb: 92.5 % — ABNORMAL LOW (ref 94.0–97.0)
BASE EXCESS: 5.9 mmol/L — ABNORMAL HIGH (ref 0–3)
BASE EXCESS: 2.5 mmol/L (ref 0–3)
BICARBONATE: 30 mmol/L — ABNORMAL HIGH (ref 22.0–26.0)
BICARBONATE: 28 mmol/L — ABNORMAL HIGH (ref 22.0–26.0)
CARBOXYHEMOGLOBIN: 0.7 % (ref 0.5–1.5)
CARBOXYHEMOGLOBIN: 0.3 % — ABNORMAL LOW (ref 0.5–1.5)
DEOXYHEMOGLOBIN: 1 % (ref 0.0–5.0)
DEOXYHEMOGLOBIN: 7 % — ABNORMAL HIGH (ref 0.0–5.0)
FIO2: 80 %
METHEMOGLOBIN: 0.3 % (ref 0.0–1.5)
METHEMOGLOBIN: 0.5 % (ref 0.0–1.5)
O2 SAT: 99 % — ABNORMAL HIGH (ref 92.0–98.5)
O2 SAT: 93 % (ref 92.0–98.5)
PCO2: 40 mmHg (ref 35.0–45.0)
PCO2: 44 mmHg (ref 35.0–45.0)
PEEP/CPAP: 10
PEEP/CPAP: 10
PO2: 123 mmHg — ABNORMAL HIGH (ref 75.0–100.0)
PO2: 73 mmHg — ABNORMAL LOW (ref 75.0–100.0)
RATE: 20
RATE: 20
TOTAL HEMOGLOBIN: 10 GM/DL — ABNORMAL LOW (ref 11.7–15.0)
TOTAL HEMOGLOBIN: 11.9 GM/DL (ref 11.7–15.0)
Tidal volume: 400
Tidal volume: 400
pH: 7.49 — ABNORMAL HIGH (ref 7.35–7.45)
pH: 7.41 (ref 7.35–7.45)

## 2016-07-01 LAB — METABOLIC PANEL, BASIC
Anion gap: 12 mmol/L (ref 7–16)
Anion gap: 11 mmol/L (ref 7–16)
Anion gap: 10 mmol/L (ref 7–16)
Anion gap: 7 mmol/L (ref 7–16)
BUN: 35 MG/DL — ABNORMAL HIGH (ref 6–23)
BUN: 36 MG/DL — ABNORMAL HIGH (ref 6–23)
BUN: 38 MG/DL — ABNORMAL HIGH (ref 6–23)
BUN: 39 MG/DL — ABNORMAL HIGH (ref 6–23)
CO2: 26 mmol/L (ref 21–32)
CO2: 27 mmol/L (ref 21–32)
CO2: 31 mmol/L (ref 21–32)
CO2: 32 mmol/L (ref 21–32)
Calcium: 7.3 MG/DL — ABNORMAL LOW (ref 8.3–10.4)
Calcium: 7.5 MG/DL — ABNORMAL LOW (ref 8.3–10.4)
Calcium: 7.6 MG/DL — ABNORMAL LOW (ref 8.3–10.4)
Calcium: 7.7 MG/DL — ABNORMAL LOW (ref 8.3–10.4)
Chloride: 97 mmol/L — ABNORMAL LOW (ref 98–107)
Chloride: 99 mmol/L (ref 98–107)
Chloride: 97 mmol/L — ABNORMAL LOW (ref 98–107)
Chloride: 99 mmol/L (ref 98–107)
Creatinine: 1.44 MG/DL (ref 0.8–1.5)
Creatinine: 1.39 MG/DL (ref 0.8–1.5)
Creatinine: 1.43 MG/DL (ref 0.8–1.5)
Creatinine: 1.48 MG/DL (ref 0.8–1.5)
GFR est AA: >60 mL/min/{1.73_m2} (ref >60)
GFR est AA: >60 mL/min/{1.73_m2} (ref >60)
GFR est AA: >60 mL/min/{1.73_m2} (ref >60)
GFR est AA: >60 mL/min/{1.73_m2} (ref >60)
GFR est non-AA: 54 mL/min/{1.73_m2} — ABNORMAL LOW (ref >60)
GFR est non-AA: 56 mL/min/{1.73_m2} — ABNORMAL LOW (ref >60)
GFR est non-AA: 54 mL/min/{1.73_m2} — ABNORMAL LOW (ref >60)
GFR est non-AA: 52 mL/min/{1.73_m2} — ABNORMAL LOW (ref >60)
Glucose: 313 mg/dL — ABNORMAL HIGH (ref 65–100)
Glucose: 328 mg/dL — ABNORMAL HIGH (ref 65–100)
Glucose: 211 mg/dL — ABNORMAL HIGH (ref 65–100)
Glucose: 134 mg/dL — ABNORMAL HIGH (ref 65–100)
Potassium: 3.7 mmol/L (ref 3.5–5.1)
Potassium: 3.3 mmol/L — ABNORMAL LOW (ref 3.5–5.1)
Potassium: 3.8 mmol/L (ref 3.5–5.1)
Potassium: 4.1 mmol/L (ref 3.5–5.1)
Sodium: 135 mmol/L — ABNORMAL LOW (ref 136–145)
Sodium: 137 mmol/L (ref 136–145)
Sodium: 138 mmol/L (ref 136–145)
Sodium: 138 mmol/L (ref 136–145)

## 2016-07-01 LAB — GLUCOSE, POC
Glucose (POC): 320 mg/dL — ABNORMAL HIGH (ref 65–100)
Glucose (POC): 317 mg/dL — ABNORMAL HIGH (ref 65–100)
Glucose (POC): 355 mg/dL — ABNORMAL HIGH (ref 65–100)
Glucose (POC): 228 mg/dL — ABNORMAL HIGH (ref 65–100)
Glucose (POC): 138 mg/dL — ABNORMAL HIGH (ref 65–100)

## 2016-07-01 LAB — COLLECTION COMMENT

## 2016-07-01 LAB — MAGNESIUM: Magnesium: 2.2 mg/dL (ref 1.8–2.4)

## 2016-07-01 LAB — VANCOMYCIN, TROUGH: Vancomycin,trough: 18.1 ug/mL (ref 5–20)

## 2016-07-01 MED ORDER — BUDESONIDE 0.5 MG/2 ML NEB SUSPENSION
0.5 mg/2 mL | Freq: Two times a day (BID) | RESPIRATORY_TRACT | Status: DC
Start: 2016-07-01 — End: 2016-07-09
  Administered 2016-07-01 – 2016-07-09 (×15): via RESPIRATORY_TRACT

## 2016-07-01 MED ORDER — ALBUTEROL SULFATE 0.083 % (0.83 MG/ML) SOLN FOR INHALATION
2.5 mg /3 mL (0.083 %) | RESPIRATORY_TRACT | Status: DC
Start: 2016-07-01 — End: 2016-07-09
  Administered 2016-07-01 – 2016-07-09 (×46): via RESPIRATORY_TRACT

## 2016-07-01 MED ORDER — POTASSIUM CHLORIDE 20 MEQ/100 ML IV PIGGY BACK
20 mEq/100 mL | INTRAVENOUS | Status: AC
Start: 2016-07-01 — End: 2016-07-09
  Administered 2016-07-01 (×2): via INTRAVENOUS

## 2016-07-01 MED ORDER — LIDOCAINE 4 % TOPICAL SOLN
4 % (0 mg/mL) | Status: DC | PRN
Start: 2016-07-01 — End: 2016-07-09
  Administered 2016-07-01: 18:00:00 via TOPICAL

## 2016-07-01 MED ORDER — SODIUM CHLORIDE 0.9% BOLUS IV
0.9 % | Freq: Once | INTRAVENOUS | Status: AC
Start: 2016-07-01 — End: 2016-07-09
  Administered 2016-07-01: 22:00:00 via INTRAVENOUS

## 2016-07-01 MED ORDER — SODIUM CHLORIDE 0.9 % IV
INTRAVENOUS | Status: DC
Start: 2016-07-01 — End: 2016-07-02
  Administered 2016-07-01: 22:00:00 via INTRAVENOUS

## 2016-07-01 MED ORDER — NUTRITIONAL PHARMACY SUPPORT ORDERS
Status: DC | PRN
Start: 2016-07-01 — End: 2016-07-01

## 2016-07-01 MED FILL — PIPERACILLIN-TAZOBACTAM 3.375 GRAM IV SOLR: 3.375 gram | INTRAVENOUS | Qty: 3.38

## 2016-07-01 MED FILL — SOLU-MEDROL (PF) 125 MG/2 ML SOLUTION FOR INJECTION: 125 mg/2 mL | INTRAMUSCULAR | Qty: 2

## 2016-07-01 MED FILL — SODIUM CHLORIDE 0.9 % IV: INTRAVENOUS | Qty: 1000

## 2016-07-01 MED FILL — POTASSIUM CHLORIDE 20 MEQ/100 ML IV PIGGY BACK: 20 mEq/100 mL | INTRAVENOUS | Qty: 100

## 2016-07-01 MED FILL — DEXTROSE 5% IN WATER (D5W) IV: INTRAVENOUS | Qty: 1000

## 2016-07-01 MED FILL — INSULIN REGULAR HUMAN 100 UNIT/ML INJECTION: 100 unit/mL | INTRAMUSCULAR | Qty: 1

## 2016-07-01 MED FILL — NUTRITIONAL PHARMACY SUPPORT ORDERS: Qty: 1

## 2016-07-01 MED FILL — BUDESONIDE 0.5 MG/2 ML NEB SUSPENSION: 0.5 mg/2 mL | RESPIRATORY_TRACT | Qty: 1

## 2016-07-01 MED FILL — NICOTINE 14 MG/24 HR DAILY PATCH: 14 mg/24 hr | TRANSDERMAL | Qty: 1

## 2016-07-01 MED FILL — ALBUTEROL SULFATE 0.083 % (0.83 MG/ML) SOLN FOR INHALATION: 2.5 mg /3 mL (0.083 %) | RESPIRATORY_TRACT | Qty: 1

## 2016-07-01 MED FILL — FAMOTIDINE (PF) 20 MG/2 ML IV: 20 mg/2 mL | INTRAVENOUS | Qty: 2

## 2016-07-01 MED FILL — VANCOMYCIN IN 0.9 % SODIUM CHLORIDE 1.25 GRAM/250 ML IV: 1.25 gram/250 mL | INTRAVENOUS | Qty: 250

## 2016-07-01 NOTE — Progress Notes (Signed)
Dr. Lona KettleBoota notified of urine output.  Orders obtained.

## 2016-07-01 NOTE — Progress Notes (Signed)
Pt desaturating.  Pt repositioned in bed supine and upright.  RT notified.  Sats sustaining in high 70s.  Lung sounds diminished.  LLL more diminished than on earlier assessment.  Dr. Criselda PeachesMullen notified and to beside to assess.

## 2016-07-01 NOTE — Progress Notes (Signed)
Bair hugger placed.

## 2016-07-01 NOTE — Progress Notes (Signed)
Ventilator check complete; patient has a #7.5 ET tube secured at the 23 at the lip.  Patient is sedated.  Patient is not able to follow commands.  Breath sounds are diminished.  Trachea is midline, Negative for subcutaneous air, and chest excursion is symmetric. Patient is also Negative for cyanosis.  All alarms are set and audible.  Resuscitation bag is at the head of the bed.      Ventilator Settings  Mode FIO2 Rate Tidal Volume Pressure PEEP I:E Ratio   PRVC  70 %   20 400 ml     10 cm H20  1:2.33      Peak airway pressure: 28.2 cm H2O   Minute ventilation: 8.4 l/min     ABG:   Recent Labs      07/01/16   0342  06/30/16   1603  06/30/16   0430   PH  7.49*  7.40  7.38   PCO2  40  37  32*   PO2  123*  104*  62*   HCO3  30*  22  18*         Sarah S Sojourner, RT

## 2016-07-01 NOTE — Procedures (Signed)
PROCEDURE    Bronchoscopy with airway inspection/cleanout/    INDICATION   Cap ,mucus plugging    EQUIPMENT:    Olympus Q 180 Bronchhoscope.    ANESTHESIA    Concious sedation with: Marland Kitchen.  Please see ICU/CCU/CTICU medication record.    AIRWAY INSPECTION    After obtaining informed consent, using a bite block/ET tube adapter, an Olympus q 180 video/fiberoptic bronchoscope was  introduced into the trachea through the /ET tube, without complication.         RIGHT    LOCATION NORM/ABNORM DESCRIPTION   VOCAL CORDS  Not seen   TRACHEA NL Et tube tip distal trachea   CARINA NL    RMSB NL    RUL NL Airways large amount of yellow secretion with some mucus plugging removed with saline seen bilateral   BI NL    RML NL    SUP SEGM RLL NL    MED BASAL NL    ANTERIOR BASAL NL    LATERAL BASAL NL    POSTERIOR BASAL NL                                              LEFT    LOCATION NORMAL/ABNORMAL TYPE   LMSB NL    LUL NL    LINGULA NL    SUPERIOR DIVISION NL    SUPERIOR SEG LLL NL    ANTERO-MEDIAL LLL NL    LATERAL LLL NL    POSTERIOR LLL NL      The following samples were obtained:      Bronchial Wash:    Samples were sent for:  cultures  The procedure was completed  without complication and the patient tolerated the procedure well.  Estimated blood loss-  0 to minimum  Recommendations:  Follow up resulst    Allie Bossierharles Mullen Jr V, MD

## 2016-07-01 NOTE — H&P (Signed)
Date of Surgery Update:  Brett Becker was seen and examined.  History and physical has been reviewed. The patient has been examined. There have been no significant clinical changes since the completion of the originally dated History and Physical.    Signed By: Allie Bossierharles Mullen Jr V, MD     July 01, 2016 1:55 PM

## 2016-07-01 NOTE — Other (Signed)
Interdisciplinary team rounds were held 07/01/2016 with the following team members:Nursing, Nurse Practitioner, Palliative Care, Pharmacy, Physical Therapy, Physician, Respiratory Therapy and Clinical Coordinator and the patient.    Plan of care discussed. See clinical pathway and/or care plan for interventions and desired outcomes.

## 2016-07-01 NOTE — Progress Notes (Signed)
Ventilator check complete; patient has a #7.5 ET tube secured at the 23 at the lip.  Patient is sedated.  Patient is not able to follow commands.  Breath sounds are diminished.  Trachea is midline, Negative for subcutaneous air, and chest excursion is symmetric. Patient is also Negative for cyanosis and is Negative for pitting edema.  All alarms are set and audible.  Resuscitation bag is at the head of the bed.      Ventilator Settings  Mode FIO2 Rate Tidal Volume Pressure PEEP I:E Ratio   PRVC  91 %    400 ml     10 cm H20  1:2.33      Peak airway pressure: 28.8 cm H2O   Minute ventilation: 8.9 l/min     ABG:   Recent Labs      06/30/16   1603  06/30/16   0430  06/29/16   2335   PH  7.40  7.38  7.33*   PCO2  37  32*  30*   PO2  104*  62*  62*   HCO3  22  18*  15*         Kathy E Bowling, RT

## 2016-07-01 NOTE — Progress Notes (Signed)
Bedside shift change report given to Strad RN (oncoming nurse) by Vonna DraftsAmy barret RN (offgoing nurse). Report included the following information SBAR, Kardex, Procedure Summary, Intake/Output, MAR, Recent Results, Med Rec Status and Cardiac Rhythm 1st degree hb.

## 2016-07-01 NOTE — Progress Notes (Signed)
Care Daily Progress Note: 07/01/2016  Admission Date: Jul 10, 2016     The patient's chart is reviewed and the patient is discussed with the staff.    Subjective:   55 y/o with male admitted with COPD exacerbation, respiratory failure and etoh abuse. ??On bipap then intubated yesterday.  Called to see stat due to  Drop in oxygenation  - now on 100% and 10 of peep      Current Facility-Administered Medications   Medication Dose Route Frequency   ??? potassium chloride 20 mEq in 100 ml IVPB  20 mEq IntraVENous Q2H   ??? Vancomycin trough reminder   Other PRN   ??? fentaNYL in normal saline (pf) 25 mcg/mL infusion  25-200 mcg/hr IntraVENous TITRATE   ??? midazolam (VERSED) 100 mg in 0.9% sodium chloride 100 mL infusion  1-10 mg/hr IntraVENous TITRATE   ??? albuterol (PROVENTIL HFA, VENTOLIN HFA, PROAIR HFA) inhaler 6 Puff  6 Puff Inhalation Q4H RT   ??? insulin regular (NOVOLIN R, HUMULIN R) injection   SubCUTAneous Q6H   ??? NUTRITIONAL SUPPORT ELECTROLYTE PRN ORDERS   Does Not Apply PRN   ??? LORazepam (ATIVAN) injection 0.5 mg  0.5 mg IntraVENous Q3H PRN   ??? metoprolol (LOPRESSOR) injection 5 mg  5 mg IntraVENous Q4H PRN   ??? sodium chloride (NS) flush 5-10 mL  5-10 mL IntraVENous Q8H   ??? sodium chloride (NS) flush 5-10 mL  5-10 mL IntraVENous PRN   ??? famotidine (PF) (PEPCID) 20 mg in sodium chloride 0.9 % 10 mL injection  20 mg IntraVENous Q12H   ??? nicotine (NICODERM CQ) 14 mg/24 hr patch 1 Patch  1 Patch TransDERmal DAILY   ??? vancomycin (VANCOCIN) 1250 mg in NS 250 ml infusion  1,250 mg IntraVENous Q24H   ??? piperacillin-tazobactam (ZOSYN) 3.375 g in 0.9% sodium chloride (MBP/ADV) 100 mL  3.375 g IntraVENous Q8H   ??? methylPREDNISolone (PF) (SOLU-MEDROL) injection 60 mg  60 mg IntraVENous Q8H       Review of Systems   Unobtainable due to patient status.          Objective:     Vitals:    07/01/16 1016 07/01/16 1027 07/01/16 1030 07/01/16 1101   BP:   155/89 (!) 187/105   Pulse:  90 92 92   Resp:  24 24 (!) 32    Temp: (!) 93.4 ??F (34.1 ??C)      SpO2:  (!) 78% (!) 86% 90%   Weight:       Height:           Intake and Output:   07/18 1901 - 07/20 0700  In: 4785.1 [I.V.:4785.1]  Out: 1350 [Urine:1350]       Physical Exam:          Constitutional:  intubated and mechanically ventilated.  EENMT:  Sclera clear, pupils equal, oral mucosa moist  Respiratory: very diminished- left > r  Cardiovascular:  RRR with no M,G,R;  Gastrointestinal:  soft with no tenderness; positive bowel sounds present  Musculoskeletal:  warm with no cyanosis, no lower leg edema  Skin:  no jaundice or ecchymosis  Some cyanosis  Neurologic: no gross neuro deficits     Psychiatric:  sedated    LINES:  ETT, central    DRIPS:  Fentanyl,  versed    CXR:        Ventilator Settings  Mode FIO2 Rate Tidal Volume Pressure PEEP   PRVC  100 %    400 ml  10 cm H20      Peak airway pressure: 14.4 cm H2O   Minute ventilation: 8.4 l/min     ABG:   Recent Labs      07/01/16   1040  07/01/16   0342  06/30/16   1603   PH  7.41  7.49*  7.40   PCO2  44  40  37   PO2  73*  123*  104*   HCO3  28*  30*  22        LAB  Recent Labs      07/01/16   1006  07/01/16   0544  07/01/16   0005  06/30/16   1802   GLUCPOC  355*  317*  320*  342*     Recent Labs      06/30/16   0420  06/29/16   0610   WBC  5.2  5.9   HGB  10.5*  12.7*   HCT  29.0*  34.6*   PLT  82*  81*     Recent Labs      07/01/16   0733  07/01/16   0055  06/30/16   1702   06/30/16   0420   06/29/16   0610   NA  137  135*  134*   < >  134*   < >  126*   K  3.3*  3.7  3.3*   < >  3.2*   < >  3.8   CL  99  97*  96*   < >  99   < >  92*   CO2  < >  22   < >  13*   GLU  328*  313*  287*   < >  263*   < >  113*   BUN  36*  35*  35*   < >  37*   < >  34*   CREA  1.39  1.44  1.39   < >  1.21   < >  1.23   MG   --   2.2   --    --   1.6*   --   1.9   PHOS   --    --    --    --   2.8   --   4.6*   CA  7.5*  7.3*  7.6*   < >  6.9*   < >  7.6*    < > = values in this interval not displayed.     Recent Labs       06/29/16   0926   LAC  2.6*         Assessment:  (Medical Decision Making)     Hospital Problems  Date Reviewed: Jul 25, 2016          Codes Class Noted POA    CAP (community acquired pneumonia) ICD-10-CM: J18.9  ICD-9-CM: 486  06/29/2016 Unknown    Bilateral progressive    COPD exacerbation (HCC) ICD-10-CM: J44.1  ICD-9-CM: 491.21  07-25-16 Yes    ongoing    Lactic acidosis ICD-10-CM: E87.2  ICD-9-CM: 276.2  07/25/2016 Unknown        ETOH abuse ICD-10-CM: F10.10  ICD-9-CM: 305.00  07/25/2016 Unknown        * (Principal)Acute hypoxemic respiratory failure (HCC) ICD-10-CM: J96.01  ICD-9-CM: 518.81  07-25-2016 Unknown    Critically ill  Protein calorie malnutrition (HCC) ICD-10-CM: E46  ICD-9-CM: 263.9  06/14/2016 Unknown        Elevated troponin ICD-10-CM: R74.8  ICD-9-CM: 790.6  06/24/2016 Unknown        AKI (acute kidney injury) (HCC) ICD-10-CM: N17.9  ICD-9-CM: 584.9  06/19/2016 Unknown        Thrombocytopenia (HCC) ICD-10-CM: D69.6  ICD-9-CM: 287.5  06/27/2016 Unknown        Bullous emphysema- severe (Chronic) ICD-10-CM: J43.9  ICD-9-CM: 492.0  10/15/2013 Yes              Plan:  (Medical Decision Making)   1    Stat cxr  2    Now on 100%  3    May need chest tube or bronch    Critical care time 42 minutes  --  cMore than 50% of the time documented was spent in face-to-face contact with the patient and in the care of the patient on the floor/unit where the patient is located.    Allie Bossierharles Mullen Jr V, MD

## 2016-07-01 NOTE — Progress Notes (Signed)
Pharmacokinetic Consult to Pharmacist    Brett Becker is a 55 y.o. male being treated for sepsis/pneumonia with Zosyn and vancomycin.    Height: 5\' 8"  (172.7 cm)  Weight: 57.3 kg (126 lb 5.2 oz)  Lab Results   Component Value Date/Time    BUN 36 07/01/2016 07:33 AM    Creatinine 1.39 07/01/2016 07:33 AM    WBC 5.2 06/30/2016 04:20 AM    Procalcitonin 1.2 07/02/2016 01:19 AM    Lactic acid 2.6 06/29/2016 09:26 AM    Lactic acid 1.3 03/20/2015 06:55 PM      Estimated Creatinine Clearance: 48.1 mL/min (based on Cr of 1.39).    All Micro Results     Procedure Component Value Units Date/Time    CULTURE, BLOOD [[id number]][[id number]] Collected:  07/06/2016 0159    Order Status:  Completed Specimen:  Blood from Blood Updated:  07/01/16 0730     Special Requests: RIGHT ANTECUBITAL        Culture result: NO GROWTH 3 DAYS       CULTURE, BLOOD [[id number]][[id number]] Collected:  06/23/2016 0210    Order Status:  Completed Specimen:  Blood from Blood Updated:  07/01/16 0730     Special Requests: rfa     Culture result: NO GROWTH 3 DAYS       MRSA SCREEN - PCR (NASAL) [[id number]][[id number]] Collected:  06/16/2016 1510    Order Status:  Completed Specimen:  Nasal from Nasal Swab Updated:  07/02/2016 1655     Special Requests: NO SPECIAL REQUESTS        Culture result:         MRSA target DNA is not detected (presumptive not colonized with MRSA)        Lab Results   Component Value Date/Time    Vancomycin,trough 18.1 07/01/2016 12:55 AM       Day 4 of vancomycin.  Goal trough is 15-20. Trough within goal. Vancomycin dose initiated at 1.25 g q24h. Will continue current regimen.  Will continue to follow patient.      Thank you,  Oval LinseyBrittany NeSmith, PharmD  Clinical Pharmacist  [phone]

## 2016-07-01 NOTE — Progress Notes (Signed)
Physical Therapy Note:    Participated in interdisciplinary rounds including Chaplin, Wound Care, Palliative Care NP, RN staff, MD, Respiratory therapy, and Nutrition.  Patient at this time is not alert and desaturating with rolling.  PT will discontinue consult at this time.  Will continue to participate in rounds to determine appropriateness of PT/OT.    Thank you,  Berenice BoutonKatherine A Davis, PT, DPT

## 2016-07-01 NOTE — Progress Notes (Signed)
Temp noted.  Bair hugger decreased to low.

## 2016-07-01 NOTE — Progress Notes (Signed)
Late entry.    Daughter at bedside.  Dr. Criselda PeachesMullen to bedside to speak with daughter.

## 2016-07-01 NOTE — Progress Notes (Signed)
Attempting to contact family.  Attempted to call Carolina SinkRita but number has been disconnected.  Called Raynelle FanningJulie, pt's aunt, who is going to attempt to get in touch with family.

## 2016-07-01 NOTE — Consults (Addendum)
Palliative Care    Patient: Brett Becker MRN: [id number]  SSN: [id number]    Date of Birth: 1961/08/22  Age: 55 y.o.  Sex: male       Date of Request: 07/01/2016  Date of Consult:  07/01/2016  Reason for Consult:  goals of care  Requesting Physician: Dr Criselda Peaches     Assessment/Plan:     Principal Diagnosis:    Debility, Unspecified  R53.81    Additional Diagnoses:   ?? Failure to Thrive  R62.7  ?? Frailty  R54  ?? Respiratory Failure, Acute on Chronic  J96.20  ?? Counseling, Encounter for Medical Advice  Z71.9  ?? Encounter for Palliative Care  Z51.5    Palliative Performance Scale (PPS):  PPS: 30    Medical Decision Making:   Reviewed and summarized notes from admission to present   Discussed case with appropriate providers- ICU IDT; Amy, RN  Reviewed laboratory and x-ray data from admission to present     Pt sedated, intubated and ventilated.  No family at bedside.  Discussed with Amy, RN- she reports pt's daughter, Brett Becker, is on her way to the hospital.  Will speak with her when she arrives.  Pt with known ETOH abuse, very poor nutritional status, and severe bullous emphysema.  Prognosis is poor.  Will continue to follow.    1215-  Spoke with daughter, Brett Becker, at bedside.  She has spoken with Dr Criselda Peaches and he has updated her on pt's condition and plan of care.  At present,  Brett Becker wants to continue with current efforts and would want resuscitative efforts if needed.  She is aware of pt's poor prognosis.  Provided support and assured her of our ongoing care.  Will continue to follow.      Will discuss findings with members of the interdisciplinary team.      Thank you for this referral.        .    Subjective:     History obtained from:  Family, Care Provider and Chart    Chief Complaint: Respiratory failure   History of Present Illness:  Brett Becker is a 55 yo caucasian male with PMH of bullous emphysema, COPD, ETOH abuse, HTN, Hepatitis C, and malnutrition, who presented to the ER from home on 06/14/2016 after being  found in distress by his friend. Pt was noted to be diaphoretic, with labored respirations.  En route to the hospital, pt became unresponsive, and required ventilation via BVM.  His status improved and he was placed on CPAP and transported to the ER.  Work up in the ER revealed T-wave inversion on EKG, multiple electrolyte derangements, and possible PNA.  Pt was placed on BIPAP and admitted for further management.  He has been on and off BIPAP since admission, and declined to the point on 7/19 that he required intubation.  He remains intubated at present.      Advance Directive: No       Code Status:  Full Code            Health Care Power of Attorney: No - Patient does not have a Health Care Power of [address]    Past Medical History:   Diagnosis Date   ??? 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  Found to have GI bleeding secondary to gastric ullcers.   ??? Gastric ulcer September, 2014    Found at EGD to have major gastric ulcers.  Past Surgical History:   Procedure Laterality Date   ??? HX BACK SURGERY      x 2   ??? HX ENDOSCOPY  September, 2014    Major gastric ulcer   ??? NEUROLOGICAL PROCEDURE UNLISTED       Family History   Problem Relation Age of Onset   ??? Heart Disease Mother      CHF      Social History   Substance Use Topics   ??? Smoking status: Current Every Day Smoker     Packs/day: 1.00     Years: 40.00     Types: Cigarettes   ??? Smokeless tobacco: Never Used   ??? Alcohol use Yes     Prior to Admission medications    Medication Sig Start Date End Date Taking? Authorizing Provider   albuterol-ipratropium (DUO-NEB) 2.5 mg-0.5 mg/3 ml nebu 3 mL by Nebulization route every six (6) hours as needed. 03/17/15  Yes Ferdinand Cava, MD   diazepam (VALIUM) 5 mg tablet Take 0.5 Tabs by mouth every six (6) hours. Max Daily Amount: 10 mg. 03/26/15   Zola Button, MD   levETIRAcetam (KEPPRA) 500 mg tablet Take 1 Tab by mouth two (2) times a  day. 03/17/15   Ferdinand Cava, MD   cloNIDine (CATAPRES) 0.2 mg/24 hr patch 1 Patch by TransDERmal route every seven (7) days. 03/17/15   Ferdinand Cava, MD   magnesium oxide (MAG-OX) 400 mg tablet Take 1 Tab by mouth two (2) times a day. 03/17/15   Ferdinand Cava, MD       Allergies   Allergen Reactions   ??? Codeine Rash        Review of Systems:  Review of systems not obtained due to patient factors- sedated, intubated and ventilated      Objective:     Visit Vitals   ??? BP (!) 143/94   ??? Pulse 62   ??? Temp 96.1 ??F (35.6 ??C)   ??? Resp 20   ??? Ht  (1.727 m)   ??? Wt 57.3 kg (126 lb 5.2 oz)   ??? SpO2 90%   ??? BMI 19.21 kg/m2        Physical Exam:    General:  Sedated, intubated and ventilated.  Thin and frail. No acute distress.   Eyes:  Conjunctivae/corneas clear    Nose: Nares normal. Septum midline.   Neck: Supple, symmetrical, trachea midline   Lungs:   Coarse bilaterally, unlabored   Heart:  Regular rate and rhythm    Abdomen:   Soft, non-tender, non-distended   Extremities: Normal, atraumatic, no cyanosis or edema   Skin: Skin color, texture, turgor normal.   Neurologic: Sedated    Psych: Sedated       Assessment:     Hospital Problems  Date Reviewed: 06/29/16          Codes Class Noted POA    CAP (community acquired pneumonia) ICD-10-CM: J18.9  ICD-9-CM: 486  06/29/2016 Unknown        COPD exacerbation (HCC) ICD-10-CM: J44.1  ICD-9-CM: 491.21  29-Jun-2016 Yes        Lactic acidosis ICD-10-CM: E87.2  ICD-9-CM: 276.2  June 29, 2016 Unknown        ETOH abuse ICD-10-CM: F10.10  ICD-9-CM: 305.00  06/29/2016 Unknown        * (Principal)Acute hypoxemic respiratory failure (HCC) ICD-10-CM: J96.01  ICD-9-CM: 518.81  Jun 29, 2016 Unknown        Protein calorie malnutrition (HCC)  ICD-10-CM: E46  ICD-9-CM: 263.9  07/12/2016 Unknown        Elevated troponin ICD-10-CM: R74.8  ICD-9-CM: 790.6  07/08/2016 Unknown        AKI (acute kidney injury) (HCC) ICD-10-CM: N17.9  ICD-9-CM: 584.9  07/06/2016 Unknown         Thrombocytopenia (HCC) ICD-10-CM: D69.6  ICD-9-CM: 287.5  06/16/2016 Unknown        Bullous emphysema- severe (Chronic) ICD-10-CM: J43.9  ICD-9-CM: 492.0  10/15/2013 Yes              Signed By: Eden EmmsLeigh C Linkon Siverson, NP     July 01, 2016

## 2016-07-01 NOTE — Progress Notes (Signed)
Problem: Nutrition Deficit  Goal: *Optimize nutritional status  Nutrition:  Reason for assessment: Consult for TF management per nutritional support protocols. ( Dr Criselda PeachesMullen)  Assessment:   Food/Nutrition History: Hx of ETOH use and COPDOd. Admitted with respiratory failure and intubated yesterday. FT in place,  Abdomen flat with active bowel sounds. Date of Last BM: 7-19  Pertinent Medications: SSI, Pepcid, Solumedrol, Zosyn  Pertinent labs: K 3.3-receiving supplementation today  POC Glucoses:  217-355 mg/dl, no hx of DM. Likely d/t steroids,  IVF containing D5W was stopped this AM.May need insulin drip or increased SSI frequency and dose if glucoses trend up with kcal from TF.  Anthropometrics:Height: 5\' 8"  (172.7 cm), Weight Source: Bed, Weight: 57.3 kg (126 lb 5.2 oz), Body mass index is 19.21 kg/(m^2). BMI class of normal weight. Weight hx per EMR ( Based on connect care functionality, RD cannot know if these weight are actual versus stated):    WT / BMI 07/01/2016 06/18/2016 03/20/2015 02/25/2015 03/16/2014   WEIGHT 126 lb 5.2 oz 115 lb 114 lb 114 lb 8 oz 125 lb       WT / BMI 10/16/2013 08/28/2013   WEIGHT 124 lb 121 lb 11.1 oz   Macronutrient needs:  EER:  1425-1596 kcal /day (25-18 kcal/kg ABW of 57 kg)  EPR:  86-103 grams protein/day (1.5-1.8 grams/kg IBW)-as renal function allows  Max CHO:  200 grams/day (50% kcal)  Fluid:  851ml/kcal  Intake/Comparative standards: Current NPO status does not meet kcal or protein needs     Nutrition Diagnosis: Difficulty swallowing r/t respiratory failure as evidenced by intubation precludes po intake.      Intervention:  Meals and snacks: NPO  ZO:[id number]:Start TF of Glucerna 1.2 at 25 ml/hr and increase by 10 ml/hr q 8 hrs as tolerated to goal rate of 2255ml/hr with 20ml water q 1hr to provide 1584 kcal/day (100% of needs), 79 grams protein/day (92% of needs), 152 grams CHO/day (does not exceed max CHO),  and ~ 1545ml free water/day (100% of needs).   Nutrition Supplement Therapy: Electrolyte replacement per nutritional support protocols are active on MAR.  Labs: Phos and Magnesium MWF  Coordination of nutrition care: Discussed with Davonne Jarnigan Barrett, RN     Harlow MaresAmy Jabarie Pop, RD, LD, CNSC [phone]

## 2016-07-01 NOTE — Progress Notes (Signed)
NGT advanced 5 cm d/t radiology recommendation.

## 2016-07-01 NOTE — Progress Notes (Signed)
Pt appears to be shivering.  Bair hugger is in place.  Will monitor temp closely.

## 2016-07-01 NOTE — Progress Notes (Signed)
Sedation vacation started.

## 2016-07-01 NOTE — Progress Notes (Signed)
Warm blankets given.  With turning.  Pt desaturates to 70s.  RT notified.

## 2016-07-02 ENCOUNTER — Inpatient Hospital Stay: Admit: 2016-07-02 | Payer: MEDICAID | Primary: Family Medicine

## 2016-07-02 LAB — METABOLIC PANEL, BASIC
Anion gap: 13 mmol/L (ref 7–16)
Anion gap: 7 mmol/L (ref 7–16)
Anion gap: 8 mmol/L (ref 7–16)
BUN: 43 MG/DL — ABNORMAL HIGH (ref 6–23)
BUN: 49 MG/DL — ABNORMAL HIGH (ref 6–23)
BUN: 48 MG/DL — ABNORMAL HIGH (ref 6–23)
CO2: 24 mmol/L (ref 21–32)
CO2: 30 mmol/L (ref 21–32)
CO2: 30 mmol/L (ref 21–32)
Calcium: 7.5 MG/DL — ABNORMAL LOW (ref 8.3–10.4)
Calcium: 7.6 MG/DL — ABNORMAL LOW (ref 8.3–10.4)
Calcium: 7.8 MG/DL — ABNORMAL LOW (ref 8.3–10.4)
Chloride: 100 mmol/L (ref 98–107)
Chloride: 101 mmol/L (ref 98–107)
Chloride: 101 mmol/L (ref 98–107)
Creatinine: 1.9 MG/DL — ABNORMAL HIGH (ref 0.8–1.5)
Creatinine: 1.96 MG/DL — ABNORMAL HIGH (ref 0.8–1.5)
Creatinine: 1.84 MG/DL — ABNORMAL HIGH (ref 0.8–1.5)
GFR est AA: 47 mL/min/{1.73_m2} — ABNORMAL LOW (ref >60)
GFR est AA: 46 mL/min/{1.73_m2} — ABNORMAL LOW (ref >60)
GFR est AA: 49 mL/min/{1.73_m2} — ABNORMAL LOW (ref >60)
GFR est non-AA: 39 mL/min/{1.73_m2} — ABNORMAL LOW (ref >60)
GFR est non-AA: 38 mL/min/{1.73_m2} — ABNORMAL LOW (ref >60)
GFR est non-AA: 41 mL/min/{1.73_m2} — ABNORMAL LOW (ref >60)
Glucose: 188 mg/dL — ABNORMAL HIGH (ref 65–100)
Glucose: 201 mg/dL — ABNORMAL HIGH (ref 65–100)
Glucose: 219 mg/dL — ABNORMAL HIGH (ref 65–100)
Potassium: 5.6 mmol/L — ABNORMAL HIGH (ref 3.5–5.1)
Potassium: 4.6 mmol/L (ref 3.5–5.1)
Potassium: 4.5 mmol/L (ref 3.5–5.1)
Sodium: 137 mmol/L (ref 136–145)
Sodium: 138 mmol/L (ref 136–145)
Sodium: 139 mmol/L (ref 136–145)

## 2016-07-02 LAB — BLOOD GAS, ARTERIAL
ALLENS TEST: POSITIVE
Arterial O2 Hgb: 98.7 % — ABNORMAL HIGH (ref 94.0–97.0)
BASE EXCESS: 5.4 mmol/L — ABNORMAL HIGH (ref 0–3)
BICARBONATE: 30 mmol/L — ABNORMAL HIGH (ref 22.0–26.0)
CARBOXYHEMOGLOBIN: 0.5 % (ref 0.5–1.5)
DEOXYHEMOGLOBIN: 1 % (ref 0.0–5.0)
FIO2: 100 %
METHEMOGLOBIN: 0 % (ref 0.0–1.5)
O2 SAT: 99 % — ABNORMAL HIGH (ref 92.0–98.5)
PCO2: 44 mmHg (ref 35.0–45.0)
PEEP/CPAP: 10
PO2: 154 mmHg — ABNORMAL HIGH (ref 75.0–100.0)
RATE: 20
TOTAL HEMOGLOBIN: 10.7 GM/DL — ABNORMAL LOW (ref 11.7–15.0)
Tidal volume: 400
pH: 7.45 (ref 7.35–7.45)

## 2016-07-02 LAB — GLUCOSE, POC
Glucose (POC): 173 mg/dL — ABNORMAL HIGH (ref 65–100)
Glucose (POC): 201 mg/dL — ABNORMAL HIGH (ref 65–100)
Glucose (POC): 200 mg/dL — ABNORMAL HIGH (ref 65–100)
Glucose (POC): 237 mg/dL — ABNORMAL HIGH (ref 65–100)

## 2016-07-02 LAB — PHOSPHORUS: Phosphorus: 2.1 MG/DL — ABNORMAL LOW (ref 2.5–4.5)

## 2016-07-02 LAB — AMYLASE: Amylase: 27 U/L (ref 25–115)

## 2016-07-02 LAB — LIPASE: Lipase: 195 U/L (ref 73–393)

## 2016-07-02 LAB — MAGNESIUM: Magnesium: 2 mg/dL (ref 1.8–2.4)

## 2016-07-02 MED ORDER — PHARMACY VANCOMYCIN NOTE
Freq: Once | Status: AC
Start: 2016-07-02 — End: 2016-07-03
  Administered 2016-07-03: 05:00:00

## 2016-07-02 MED ORDER — POTASSIUM & SODIUM PHOSPHATES 280 MG-160 MG-250 MG ORAL POWDER PACKET
280-160-250 mg | Freq: Four times a day (QID) | ORAL | Status: AC
Start: 2016-07-02 — End: 2016-07-03
  Administered 2016-07-02 – 2016-07-03 (×4): via GASTROSTOMY

## 2016-07-02 MED ORDER — SODIUM CHLORIDE 0.9 % INJECTION
20 mg/2 mL | INTRAMUSCULAR | Status: DC
Start: 2016-07-02 — End: 2016-07-09
  Administered 2016-07-03 – 2016-07-09 (×7): via INTRAVENOUS

## 2016-07-02 MED ORDER — METHYLPREDNISOLONE (PF) 125 MG/2 ML IJ SOLR
125 mg/2 mL | Freq: Two times a day (BID) | INTRAMUSCULAR | Status: DC
Start: 2016-07-02 — End: 2016-07-04
  Administered 2016-07-03 – 2016-07-04 (×4): via INTRAVENOUS

## 2016-07-02 MED FILL — ALBUTEROL SULFATE 0.083 % (0.83 MG/ML) SOLN FOR INHALATION: 2.5 mg /3 mL (0.083 %) | RESPIRATORY_TRACT | Qty: 1

## 2016-07-02 MED FILL — PHOS-NAK 280 MG-160 MG-250 MG ORAL POWDER PACKET: 280-160-250 mg | ORAL | Qty: 1

## 2016-07-02 MED FILL — FAMOTIDINE (PF) 20 MG/2 ML IV: 20 mg/2 mL | INTRAVENOUS | Qty: 2

## 2016-07-02 MED FILL — SOLU-MEDROL (PF) 125 MG/2 ML SOLUTION FOR INJECTION: 125 mg/2 mL | INTRAMUSCULAR | Qty: 2

## 2016-07-02 MED FILL — NICOTINE 14 MG/24 HR DAILY PATCH: 14 mg/24 hr | TRANSDERMAL | Qty: 1

## 2016-07-02 MED FILL — PIPERACILLIN-TAZOBACTAM 3.375 GRAM IV SOLR: 3.375 gram | INTRAVENOUS | Qty: 3.38

## 2016-07-02 MED FILL — BUDESONIDE 0.5 MG/2 ML NEB SUSPENSION: 0.5 mg/2 mL | RESPIRATORY_TRACT | Qty: 1

## 2016-07-02 MED FILL — FENTANYL (PF) 25 MCG/ML IN 0.9 % SODIUM CHLORIDE INJECTION: 25 mcg/mL | INTRAMUSCULAR | Qty: 100

## 2016-07-02 MED FILL — MIDAZOLAM 5 MG/ML IJ SOLN: 5 mg/mL | INTRAMUSCULAR | Qty: 20

## 2016-07-02 MED FILL — VANCOMYCIN IN 0.9 % SODIUM CHLORIDE 1.25 GRAM/250 ML IV: 1.25 gram/250 mL | INTRAVENOUS | Qty: 250

## 2016-07-02 MED FILL — PHARMACY VANCOMYCIN NOTE: Qty: 1

## 2016-07-02 NOTE — Progress Notes (Signed)
Ventilator check complete; patient has a #7.5 ET tube secured at the 23 at the lip.  Patient is not able to follow commands.  Breath sounds are diminished and expiratory wheezes.  Trachea is midline, Negative for subcutaneous air, and chest excursion is symmetric. Patient is alsonegative for cyanosis and is Positive for pitting edema.  All alarms are set and audible.  Resuscitation bag is at the head of the bed.      Ventilator Settings  Mode FIO2 Rate Tidal Volume Pressure PEEP I:E Ratio   PRVC  100 % (weaned to 80% post abg)    400 ml     10 cm H20  1:1.49      Peak airway pressure: 24 cm H2O   Minute ventilation: 8.3 l/min     ABG:   Recent Labs      07/02/16   0300  07/01/16   1040  07/01/16   0342   PH  7.45  7.41  7.49*   PCO2  44  44  40   PO2  154*  73*  123*   HCO3  30*  28*  30*     Cuff pressure 36cmH20.    Brett Becker

## 2016-07-02 NOTE — Progress Notes (Signed)
Sedation vacation done

## 2016-07-02 NOTE — Progress Notes (Signed)
Brett Becker  Admission Date: 06/30/2016             ICU Daily Progress Note: 07/02/2016     55 y/o with male admitted with COPD exacerbation, respiratory failure and etoh abuse.  On bipap but required intubation for worsening hypoxia. While intubated required bronch with mucus plugs removed. ??    Subjective:     Patient remains intubated. Still with high FIO2 needs at 65%. Worsening UE edema. TTE and creatinine values noted.       Current Facility-Administered Medications   Medication Dose Route Frequency   ??? [START ON 07/03/2016] famotidine (PF) (PEPCID) 20 mg in sodium chloride 0.9 % 10 mL injection  20 mg IntraVENous Q24H   ??? [START ON 07/03/2016] vanc trough reminder   Other ONCE   ??? potassium, sodium phosphates (NEUTRA-PHOS) packet 1 Packet  1 Packet Per G Tube QID   ??? methylPREDNISolone (PF) (SOLU-MEDROL) injection 60 mg  60 mg IntraVENous Q12H   ??? lidocaine (XYLOCAINE) 4 % (40 mg/mL) topical solution   Topical PRN   ??? albuterol (PROVENTIL VENTOLIN) nebulizer solution 2.5 mg  2.5 mg Nebulization Q4H RT   ??? budesonide (PULMICORT) 500 mcg/2 ml nebulizer suspension  500 mcg Nebulization BID RT   ??? 0.9% sodium chloride infusion  100 mL/hr IntraVENous CONTINUOUS   ??? Vancomycin trough reminder   Other PRN   ??? fentaNYL in normal saline (pf) 25 mcg/mL infusion  25-200 mcg/hr IntraVENous TITRATE   ??? midazolam (VERSED) 100 mg in 0.9% sodium chloride 100 mL infusion  1-10 mg/hr IntraVENous TITRATE   ??? insulin regular (NOVOLIN R, HUMULIN R) injection   SubCUTAneous Q6H   ??? NUTRITIONAL SUPPORT ELECTROLYTE PRN ORDERS   Does Not Apply PRN   ??? LORazepam (ATIVAN) injection 0.5 mg  0.5 mg IntraVENous Q3H PRN   ??? metoprolol (LOPRESSOR) injection 5 mg  5 mg IntraVENous Q4H PRN   ??? sodium chloride (NS) flush 5-10 mL  5-10 mL IntraVENous Q8H   ??? sodium chloride (NS) flush 5-10 mL  5-10 mL IntraVENous PRN   ??? nicotine (NICODERM CQ) 14 mg/24 hr patch 1 Patch  1 Patch TransDERmal DAILY    ??? vancomycin (VANCOCIN) 1250 mg in NS 250 ml infusion  1,250 mg IntraVENous Q24H   ??? piperacillin-tazobactam (ZOSYN) 3.375 g in 0.9% sodium chloride (MBP/ADV) 100 mL  3.375 g IntraVENous Q8H         Objective:     Vitals:    07/02/16 1130 07/02/16 1148 07/02/16 1200 07/02/16 1230   BP: (!) 137/91  (!) 152/92 118/80   Pulse: 98  94 95   Resp: 20  (!) 36 20   Temp:    98.2 ??F (36.8 ??C)   SpO2: 99% 99% 96% 98%   Weight:       Height:         Intake and Output:   07/19 1901 - 07/21 0700  In: 3998.2 [I.V.:3379.2]  Out: 815 [Urine:815]       Physical Exam:          GEN: acutely ill, intubated  HEENT:  peerl, intubated  NECK:  no JVD, no retractions, no thyromegaly or masses,   LUNGS:  Clear anteriorly, decreased bases  HEART:  RRR with no M,G,R;  ABDOMEN:  soft, nt, nd, decreased BS  EXTREMITIES:  warm with no cyanosis, no lower leg edema. 1+ B UE edema, R?L with blisters  SKIN:  no jaundice or ecchymosis   NEURO:  sedated    LINES:  IV, foley, ETT  DRIPS: fentanyl  NUTRITION: TF    Ventilator Settings  Mode FIO2 Rate Tidal Volume Pressure PEEP   PRVC  65 %    400 ml     10 cm H20      Peak airway pressure: 15.4 cm H2O   Minute ventilation: 10 l/min       CHEST XRAY:   No new imaging    LAB  Recent Labs      06/30/16   0420   WBC  5.2   HGB  10.5*   HCT  29.0*   PLT  82*     Recent Labs      07/02/16   1040  07/02/16   0325  07/01/16   1700   07/01/16   0055   06/30/16   0420   NA  138  137  138   < >  135*   < >  134*   K  4.6  5.6*  4.1   < >  3.7   < >  3.2*   CL  101  100  99   < >  97*   < >  99   CO2  30  24  32   < >  26   < >  22   GLU  201*  188*  134*   < >  313*   < >  263*   BUN  49*  43*  39*   < >  35*   < >  37*   CREA  1.96*  1.90*  1.48   < >  1.44   < >  1.21   MG   --   2.0   --    --   2.2   --   1.6*   PHOS   --   2.1*   --    --    --    --   2.8    < > = values in this interval not displayed.     Recent Labs      07/02/16   0300  07/01/16   1040  07/01/16   0342   PH  7.45  7.41  7.49*    PCO2  44  44  40   PO2  154*  73*  123*   HCO3  30*  28*  30*       Cultures: NTD    Assessment:     Patient Active Problem List   Diagnosis Code   ??? 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 Alcohol abuse F10.10   ??? HTN (hypertension) I10   ??? Gastric ulcer K25.9   ??? Hepatitis C B19.20   ??? 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 Hyponatremia E87.1   ??? COPD exacerbation (HCC) J44.1   ??? Hypoxia R09.02   ??? Underweight R63.6   ??? Lactic acidosis E87.2   ??? ETOH abuse F10.10   ??? Acute hypoxemic respiratory failure (HCC) J96.01   ??? Protein calorie malnutrition (HCC) E46   ??? Elevated troponin R74.8   ??? AKI (acute kidney injury) (HCC) N17.9   ??? Thrombocytopenia (HCC) D69.6   ??? CAP (community acquired pneumonia) J18.9   ??? Acute systolic heart failure (HCC) I50.21     Patient with ongoing severe hypoxic respiratory failure, intubated. Primarily due to PNA  on setting of severe bullous emphysema. Complicated by acute systolic heart failure and acute renal failure. Patient has multi system organ dysfunction.     Plan     --severe bullous emphysema and acute respiratory failure. On solumedrol and BD. No wheeze, wean steroids. Keep vent pressures low.   -- abx for PNA: on vancomycin and zosyn. Had bronch with cultures pending.   --Worsening RUE edema. Doppler ordered.   --wean O2 as tolerated    Cecilio Asper, MD    More than 50% of time documented was spent in face-to-face contact with the patient and in the care of the patient on the floor/unit where the patient is located.

## 2016-07-02 NOTE — Progress Notes (Signed)
Notified Dr. Lona KettleBoota about pts K+ steadily climbing from 3.3 to 5.6 over 24 hrs. pts urinary output has been steadily dropping. Dr. Lona KettleBoota informed and told to put patient of nutritional support and to let dietary adjust feeding tube in morning.

## 2016-07-02 NOTE — Progress Notes (Signed)
Dr Waynette ButteryGreer notified of ultrasound report of right UE No new orders received. Platelet level 82.

## 2016-07-02 NOTE — Progress Notes (Signed)
Brett Becker, daughter called update given. Brett Becker states that her dad was incontinent at home before coming to the hospital and that how his bottom got to be broken down. She also stated that he fired the home health care that was coming out to see him.

## 2016-07-02 NOTE — Progress Notes (Signed)
Problem: Nutrition Deficit  Goal: *Optimize nutritional status  Nutrition F/U:  TF management per nutritional support protocols. ( Dr Criselda PeachesMullen)   Assessment:   ?? Remains intubated and TF dependent. Has tolerated progression of TF to goal as 0326 this AM. Residual 10-110 ml, aAbdomen flat with active bowel sounds. Date of Last BM: 7-19  ?? K trend 3.3> 3.8>4.1 yesterday (received 20 meq IV KCL x 2). K 5.6 this AM,? D/t recent boluses and/or decreased renal clearance as Creat up to 1.9 and GFR down to 39 and decreased UO. TF did not provide significant amount of K yesterday and  will provide ~68 meq K/ today. Expect K will improve with IVF and expected improved UO and if lasix is started for volume status. Would hold off on changing to lower K TF formula at this time and watch trend. Insulin for hyperglycemia may also help with lowering K level. BMP lab is still ordered for every 6 hrs.  ?? POC Glucoses: 138-228 mg/dl-improved since D5 containing IVF was stopped yesterday. No hx of DM.  Likely d/t steroids. Received 20 units SSI yesterday and 6 units thus far today  ?? Phos 2.1-c/w refeeding  ?? Last 3 Recorded Weights in this Encounter   ??  ?? 06/30/16 0500 ?? 07/01/16 0534 ?? 07/02/16 0138   ?? Weight: ?? 52.2 kg (115 lb 1.3 oz) ?? 57.3 kg (126 lb 5.2 oz) ?? 58 kg (127 lb 13.9 oz)   ?? No noted edema but RN reports concern for volume overload. UBW range per EMR : 114-124#  Macronutrient needs:   EER: 1425-1596 kcal /day (25-18 kcal/kg ABW of 57 kg)   EPR: 86-103 grams protein/day (1.5-1.8 grams/kg IBW)-as renal function allows, GFR 39 today.  Max CHO: 200 grams/day (50% kcal)   Fluid: 391ml/kcal   Intake/Comparative standards:   Current ZO:[id number]:Glucerna 1.2 at 55 ml/hr with 20ml water q 1hr provides 1584 kcal/day (100% of needs), 79 grams protein/day (~1.3 grams/kg ABW), 152 grams CHO/day (does not exceed max CHO), 68 meq K/d,and ~ 1545ml free water/day (100% of needs).   Intervention:   Meals and snacks: NPO    UJ:[id number]:Continue current TF. If K remains high, then consider change to lower K formula.  Nutrition Supplement Therapy: Electrolyte replacement per nutritional support protocols are active on MAR.   Labs: Phos and Magnesium MWF   Coordination of nutrition care: Discussed with Amedeo GoryHarelda, RN   Discharge nutrition plan: Too soon to determine.     Harlow MaresAmy Adeleine Pask, RD, LD, CNSC [phone]

## 2016-07-02 NOTE — Progress Notes (Signed)
Bedside and Verbal shift change report given to Strad RN (oncoming nurse) by Amedeo GoryHarelda RN (offgoing nurse). Report included the following information SBAR, Kardex, Intake/Output, MAR, Recent Results, Med Rec Status and Cardiac Rhythm SR/ST 90's to low 100's. Resting calmly presently HR 102 BP 150's/90's.

## 2016-07-02 NOTE — Progress Notes (Signed)
SpO2 has dropped down to 88% after O2 breaths patient comes up to 92%. SpO2 back down to 88% again on 65% FiO2 RT called to assess patient.

## 2016-07-02 NOTE — Progress Notes (Signed)
Ventilator check complete; patient has a #7.5 ET tube secured at the 23 at the lip.  Patient is sedated.  Patient is not able to follow commands.  Breath sounds are diminished.  Trachea is midline, Negative for subcutaneous air, and chest excursion is symmetric. Patient is also Negative for cyanosis and is Positive for pitting edema.  All alarms are set and audible.  Resuscitation bag is at the head of the bed.      Ventilator Settings  Mode FIO2 Rate Tidal Volume Pressure PEEP I:E Ratio   PRVC  80 %  20  400 ml     10 cm H20  1:1.49      Peak airway pressure: 25.1 cm H2O   Minute ventilation: 9.1 l/min     ABG:   Recent Labs      07/02/16   0300  07/01/16   1040  07/01/16   0342   PH  7.45  7.41  7.49*   PCO2  44  44  40   PO2  154*  73*  123*   HCO3  30*  28*  30*         Stephanie A Vanderbrink, RT

## 2016-07-02 NOTE — Other (Signed)
Interdisciplinary team rounds were held 07/02/2016 with the following team members:Care Management, Nursing, Nurse Practitioner, Nutrition, Palliative Care, Pastoral Care, Pharmacy, Physical Therapy, Physician, Respiratory Therapy, Speech Therapy and Clinical Coordinator.    Plan of care discussed. See clinical pathway and/or care plan for interventions and desired outcomes.

## 2016-07-02 NOTE — Progress Notes (Signed)
Duplex US of RUE being done at bedside presently.

## 2016-07-02 NOTE — Progress Notes (Signed)
Daughter Carolina SinkRita called to check on patient update given.

## 2016-07-02 NOTE — Progress Notes (Signed)
Ventilator check complete; patient has a #7.5 ET tube secured at the 23 at the lip.  Patient is not sedated.  Patient is not able to follow commands.  Breath sounds are coarse.  Trachea is midline, Negative for subcutaneous air, and chest excursion is symmetric. Patient is also Negative for cyanosis and is Negative for pitting edema.  All alarms are set and audible.  Resuscitation bag is at the head of the bed.      Ventilator Settings  Mode FIO2 Rate Tidal Volume Pressure PEEP I:E Ratio   PRVC  80 %    400 ml     10 cm H20  1:1.49      Peak airway pressure: 15.2 cm H2O   Minute ventilation: 8.2 l/min     ABG:   Recent Labs      07/02/16   0300  07/01/16   1040  07/01/16   0342   PH  7.45  7.41  7.49*   PCO2  44  44  40   PO2  154*  73*  123*   HCO3  30*  28*  30*         Timothy E Meadows, RT

## 2016-07-02 NOTE — Progress Notes (Signed)
PCXR done at bedside

## 2016-07-02 NOTE — Progress Notes (Signed)
Palliative Care Progress Note    Patient: Brett Becker MRN: [id number]  SSN: [id number]    Date of Birth: 1961/07/06  Age: 55 y.o.  Sex: male       Assessment/Plan:     Chief Complaint/Interval History: remains sedated, intubated and ventilated       Principal Diagnosis:    ?? Debility, Unspecified  R53.81    Additional Diagnoses:   ?? Failure to Thrive  R62.7  ?? Fatigue, Lethargy  R53.83  ?? Frailty  R54  ?? Respiratory Failure, Acute on Chronic  J96.20  ?? Encounter for Palliative Care  Z51.5    Palliative Performance Scale (PPS)  PPS: 30    Medical Decision Making:   Reviewed and summarized notes over last 24 hours   Discussed case with appropriate providers- ICU IDT  Reviewed laboratory and x-ray data- BMP    Pt sedated, intubated and ventilated.  No family at bedside.  Pt had worsening oxygenation yesterday, and required bronch with clearing of mucus plugging.  He has remained stable since that time.  Per discussion with daughter yesterday, will continue ongoing aggressive efforts.  Will continue to follow.     Will discuss findings with members of the interdisciplinary team.         More than 50% of this 15 minute visit was spent counseling and coordination of care as outlined above.    Subjective:     Review of Systems:  Review of systems not obtained due to patient factors- sedated, intubated and ventilated      Objective:     Visit Vitals   ??? BP 123/82   ??? Pulse 86   ??? Temp 97.8 ??F (36.6 ??C)   ??? Resp 20   ??? Ht 5\' 8"  (1.727 m)   ??? Wt 58 kg (127 lb 13.9 oz)   ??? SpO2 98%   ??? BMI 19.44 kg/m2       Physical Exam:    General:  Sedated, intubated and ventilated. No acute distress.   Eyes:  Conjunctivae/corneas clear    Nose: Nares normal. Septum midline. DH feeding tube   Neck: Supple, symmetrical, trachea midline   Lungs:   Decreased bilaterally   Heart:  Regular rate and rhythm   Abdomen:   Soft, non-tender, non-distended   Extremities: Normal, atraumatic, no cyanosis. BUE edema    Skin: Skin color, texture, turgor normal   Neurologic: Sedated    Psych: Sedated      Signed By: Eden EmmsLeigh C Mao Lockner, NP     July 02, 2016

## 2016-07-03 ENCOUNTER — Inpatient Hospital Stay: Admit: 2016-07-03 | Payer: MEDICAID | Primary: Family Medicine

## 2016-07-03 LAB — BLOOD GAS, ARTERIAL
ALLENS TEST: POSITIVE
Arterial O2 Hgb: 92.8 % — ABNORMAL LOW (ref 94.0–97.0)
BASE EXCESS: 6.9 mmol/L — ABNORMAL HIGH (ref 0–3)
BICARBONATE: 31 mmol/L — ABNORMAL HIGH (ref 22.0–26.0)
CARBOXYHEMOGLOBIN: 1 % (ref 0.5–1.5)
DEOXYHEMOGLOBIN: 6 % — ABNORMAL HIGH (ref 0.0–5.0)
FIO2: 70 %
METHEMOGLOBIN: 0 % (ref 0.0–1.5)
O2 SAT: 94 % (ref 92.0–98.5)
PCO2: 45 mmHg (ref 35.0–45.0)
PEEP/CPAP: 10
PO2: 68 mmHg — ABNORMAL LOW (ref 75.0–100.0)
RATE: 20
TOTAL HEMOGLOBIN: 10.3 GM/DL — ABNORMAL LOW (ref 11.7–15.0)
Tidal volume: 400
pH: 7.47 — ABNORMAL HIGH (ref 7.35–7.45)

## 2016-07-03 LAB — CULTURE, BLOOD
Culture result:: NO GROWTH
Culture result:: NO GROWTH

## 2016-07-03 LAB — METABOLIC PANEL, BASIC
Anion gap: 7 mmol/L (ref 7–16)
Anion gap: 9 mmol/L (ref 7–16)
BUN: 54 MG/DL — ABNORMAL HIGH (ref 6–23)
BUN: 54 MG/DL — ABNORMAL HIGH (ref 6–23)
CO2: 30 mmol/L (ref 21–32)
CO2: 31 mmol/L (ref 21–32)
Calcium: 7.8 MG/DL — ABNORMAL LOW (ref 8.3–10.4)
Calcium: 7.8 MG/DL — ABNORMAL LOW (ref 8.3–10.4)
Chloride: 103 mmol/L (ref 98–107)
Chloride: 104 mmol/L (ref 98–107)
Creatinine: 1.73 MG/DL — ABNORMAL HIGH (ref 0.8–1.5)
Creatinine: 1.78 MG/DL — ABNORMAL HIGH (ref 0.8–1.5)
GFR est AA: 51 mL/min/{1.73_m2} — ABNORMAL LOW (ref >60)
GFR est AA: 53 mL/min/{1.73_m2} — ABNORMAL LOW (ref >60)
GFR est non-AA: 42 mL/min/{1.73_m2} — ABNORMAL LOW (ref >60)
GFR est non-AA: 44 mL/min/{1.73_m2} — ABNORMAL LOW (ref >60)
Glucose: 137 mg/dL — ABNORMAL HIGH (ref 65–100)
Glucose: 146 mg/dL — ABNORMAL HIGH (ref 65–100)
Potassium: 4.5 mmol/L (ref 3.5–5.1)
Potassium: 4.6 mmol/L (ref 3.5–5.1)
Sodium: 142 mmol/L (ref 136–145)
Sodium: 142 mmol/L (ref 136–145)

## 2016-07-03 LAB — GLUCOSE, POC
Glucose (POC): 155 mg/dL — ABNORMAL HIGH (ref 65–100)
Glucose (POC): 169 mg/dL — ABNORMAL HIGH (ref 65–100)
Glucose (POC): 152 mg/dL — ABNORMAL HIGH (ref 65–100)
Glucose (POC): 164 mg/dL — ABNORMAL HIGH (ref 65–100)

## 2016-07-03 LAB — VANCOMYCIN, TROUGH: Vancomycin,trough: 32.7 ug/mL — CR (ref 5–20)

## 2016-07-03 MED ORDER — METOCLOPRAMIDE 5 MG/ML IJ SOLN
5 mg/mL | Freq: Four times a day (QID) | INTRAMUSCULAR | Status: DC
Start: 2016-07-03 — End: 2016-07-06
  Administered 2016-07-03 – 2016-07-06 (×12): via INTRAVENOUS

## 2016-07-03 MED ORDER — AMLODIPINE 5 MG TAB
5 mg | Freq: Every day | ORAL | Status: DC
Start: 2016-07-03 — End: 2016-07-07
  Administered 2016-07-03 – 2016-07-07 (×5): via ORAL

## 2016-07-03 MED ORDER — VANCOMYCIN IN 0.9 % SODIUM CHLORIDE 1.25 GRAM/250 ML IV
1.25 gram/250 mL | INTRAVENOUS | Status: DC
Start: 2016-07-03 — End: 2016-07-04

## 2016-07-03 MED FILL — ALBUTEROL SULFATE 0.083 % (0.83 MG/ML) SOLN FOR INHALATION: 2.5 mg /3 mL (0.083 %) | RESPIRATORY_TRACT | Qty: 1

## 2016-07-03 MED FILL — PHOS-NAK 280 MG-160 MG-250 MG ORAL POWDER PACKET: 280-160-250 mg | ORAL | Qty: 1

## 2016-07-03 MED FILL — FAMOTIDINE (PF) 20 MG/2 ML IV: 20 mg/2 mL | INTRAVENOUS | Qty: 2

## 2016-07-03 MED FILL — BUDESONIDE 0.5 MG/2 ML NEB SUSPENSION: 0.5 mg/2 mL | RESPIRATORY_TRACT | Qty: 1

## 2016-07-03 MED FILL — VANCOMYCIN IN 0.9 % SODIUM CHLORIDE 1.25 GRAM/250 ML IV: 1.25 gram/250 mL | INTRAVENOUS | Qty: 250

## 2016-07-03 MED FILL — PIPERACILLIN-TAZOBACTAM 3.375 GRAM IV SOLR: 3.375 gram | INTRAVENOUS | Qty: 3.38

## 2016-07-03 MED FILL — AMLODIPINE 5 MG TAB: 5 mg | ORAL | Qty: 1

## 2016-07-03 MED FILL — METOCLOPRAMIDE 5 MG/ML IJ SOLN: 5 mg/mL | INTRAMUSCULAR | Qty: 1

## 2016-07-03 MED FILL — NICOTINE 14 MG/24 HR DAILY PATCH: 14 mg/24 hr | TRANSDERMAL | Qty: 1

## 2016-07-03 MED FILL — SOLU-MEDROL (PF) 125 MG/2 ML SOLUTION FOR INJECTION: 125 mg/2 mL | INTRAMUSCULAR | Qty: 2

## 2016-07-03 MED FILL — MIDAZOLAM 5 MG/ML IJ SOLN: 5 mg/mL | INTRAMUSCULAR | Qty: 20

## 2016-07-03 MED FILL — METOCLOPRAMIDE 5 MG/ML IJ SOLN: 5 mg/mL | INTRAMUSCULAR | Qty: 2

## 2016-07-03 NOTE — Progress Notes (Signed)
Pharmacokinetic Consult to Pharmacist    Brett Becker is a 55 y.o. male being treated for sepsis/pneumonia with Zosyn and vancomycin.    Height:  (172.7 cm)  Weight: 62.1 kg (136 lb 14.5 oz)  Lab Results   Component Value Date/Time    BUN 54 07/03/2016 12:35 AM    Creatinine 1.78 07/03/2016 12:35 AM    WBC 5.2 06/30/2016 04:20 AM    Procalcitonin 1.2 06/30/2016 01:19 AM    Lactic acid 2.6 06/29/2016 09:26 AM    Lactic acid 1.3 03/20/2015 06:55 PM      Estimated Creatinine Clearance: 40.7 mL/min (based on Cr of 1.78).    All Micro Results     Procedure Component Value Units Date/Time    FUNGUS CULTURE AND SMEAR [facility] Records [[id number]] Collected:  07/01/16 1400    Order Status:  Completed Specimen:  Other from Miscellaneous sample Updated:  07/03/16 1136     Source BRONCHIAL WASHING        Fungus stain Direct Inoculation     Fungus (Mycology) Culture Other source received      (NOTE)  Performed At: [facility]  [address] [place], [place] [id number]  Mila Homer MD NW:[id number]         CULTURE, RESPIRATORY/SPUTUM/BRONCH Gay Filler STAIN [[id number]]  (Abnormal) Collected:  07/01/16 1400    Order Status:  Completed Specimen:  Sputum from Bronchial Washing Updated:  07/03/16 0920     Special Requests: NO SPECIAL REQUESTS        GRAM STAIN 0 TO 5 WBC'S/OIF      NO EPITHELIAL CELLS SEEN         NO DEFINITE ORGANISM SEEN        Culture result:         SCANT YEAST SUBCULTURE IN PROGRESS (A)    CULTURE, BLOOD [[id number]] Collected:  06/15/2016 0210    Order Status:  Completed Specimen:  Blood from Blood Updated:  07/03/16 0621     Special Requests: rfa     Culture result: NO GROWTH 5 DAYS       CULTURE, BLOOD [[id number]] Collected:  07/01/2016 0159    Order Status:  Completed Specimen:  Blood from Blood Updated:  07/03/16 0621     Special Requests: RIGHT ANTECUBITAL        Culture result: NO GROWTH 5 DAYS       AFB (MYCOBACTERIUM) CULTURE & SMEAR W/REFLEX ID Worthy Keeler NOCARDIA  [[id number]] Collected:  07/01/16 1400    Order Status:  Completed Updated:  07/02/16 0850    MRSA SCREEN - PCR (NASAL) [[id number]] Collected:  06/16/2016 1510    Order Status:  Completed Specimen:  Nasal from Nasal Swab Updated:  07/04/2016 1655     Special Requests: NO SPECIAL REQUESTS        Culture result:         MRSA target DNA is not detected (presumptive not colonized with MRSA)        Lab Results   Component Value Date/Time    Vancomycin,trough 32.7 07/03/2016 12:35 AM       Day 6 of vancomycin.  Goal trough is 15-20.     Trough resulted elevated at 32.7, likely due to further accumulation in setting of renal dysfunction.    For now, will dose per random levels.    Next random level tomorrow with AM labs.    Will continue to follow patient.      Thank you,  Molli Hazard  Burnadette Pop, PharmD  Clinical Pharmacist  [phone]

## 2016-07-03 NOTE — Progress Notes (Addendum)
Brett Becker  Admission Date: 06/16/2016             ICU Daily Progress Note: 07/03/2016     55 y/o with male admitted with COPD exacerbation, respiratory failure and etoh abuse. On bipap but required intubation 7/19 for worsening hypoxia. While intubated required bronch with mucus plugs removed. ??    Subjective:     Patient remains intubated. Continues to have high O2 needs on 65% with 10 PEEP currently. Had RUE doppler yesterday with report of increased swelling. Showed nonocclusive clot in R cephalic, subclavian, and brachiocephalic veins. Heparin gtt not yet started due to ongoing thrombocytopenia.     Current Facility-Administered Medications   Medication Dose Route Frequency   ??? famotidine (PF) (PEPCID) 20 mg in sodium chloride 0.9 % 10 mL injection  20 mg IntraVENous Q24H   ??? methylPREDNISolone (PF) (SOLU-MEDROL) injection 60 mg  60 mg IntraVENous Q12H   ??? lidocaine (XYLOCAINE) 4 % (40 mg/mL) topical solution   Topical PRN   ??? albuterol (PROVENTIL VENTOLIN) nebulizer solution 2.5 mg  2.5 mg Nebulization Q4H RT   ??? budesonide (PULMICORT) 500 mcg/2 ml nebulizer suspension  500 mcg Nebulization BID RT   ??? Vancomycin trough reminder   Other PRN   ??? fentaNYL in normal saline (pf) 25 mcg/mL infusion  25-200 mcg/hr IntraVENous TITRATE   ??? midazolam (VERSED) 100 mg in 0.9% sodium chloride 100 mL infusion  1-10 mg/hr IntraVENous TITRATE   ??? insulin regular (NOVOLIN R, HUMULIN R) injection   SubCUTAneous Q6H   ??? NUTRITIONAL SUPPORT ELECTROLYTE PRN ORDERS   Does Not Apply PRN   ??? LORazepam (ATIVAN) injection 0.5 mg  0.5 mg IntraVENous Q3H PRN   ??? metoprolol (LOPRESSOR) injection 5 mg  5 mg IntraVENous Q4H PRN   ??? sodium chloride (NS) flush 5-10 mL  5-10 mL IntraVENous Q8H   ??? sodium chloride (NS) flush 5-10 mL  5-10 mL IntraVENous PRN   ??? nicotine (NICODERM CQ) 14 mg/24 hr patch 1 Patch  1 Patch TransDERmal DAILY   ??? vancomycin (VANCOCIN) 1250 mg in NS 250 ml infusion  1,250 mg IntraVENous Q24H    ??? piperacillin-tazobactam (ZOSYN) 3.375 g in 0.9% sodium chloride (MBP/ADV) 100 mL  3.375 g IntraVENous Q8H         Objective:     Vitals:    07/03/16 0830 07/03/16 0900 07/03/16 0948 07/03/16 1000   BP: (!) 179/104 (!) 165/97 (!) 153/95 (!) 161/98   Pulse: (!) 102 100 (!) 114 (!) 104   Resp: 16 11 20 14    Temp:       SpO2: 95% 96% 92% 93%   Weight:       Height:         Intake and Output:   07/20 1901 - 07/22 0700  In: 4892.8 [I.V.:2626.8]  Out: 915 [Urine:915]       Physical Exam:          GEN: acutely ill, intubated  HEENT:  peerl, intubated  NECK:  no JVD, no retractions, no thyromegaly or masses,   LUNGS:  Clear anteriorly, decreased bases  HEART:  RRR with no M,G,R;  ABDOMEN:  soft, nt, nd, decreased BS  EXTREMITIES:  warm with no cyanosis, no lower leg edema. 1+ B UE edema, R>L with blisters  SKIN:  no jaundice or ecchymosis   NEURO:  sedated    LINES:  IV, foley, ETT  DRIPS: fentanyl  NUTRITION: TF    Ventilator Settings  Mode FIO2 Rate Tidal Volume Pressure PEEP   PRVC  66 % (weaned to 65% due to sat of 100%)    400 ml     10 cm H20      Peak airway pressure: 18.6 cm H2O   Minute ventilation: 7.9 l/min       CHEST XRAY:   07/03/16  Airspace densities in the mid lungs, likely pneumonia. No significant change  compared to prior exam.      LAB  No results for input(s): WBC, HGB, HCT, PLT, HGBEXT, HCTEXT, PLTEXT, HGBEXT, HCTEXT, PLTEXT in the last 72 hours.  Recent Labs      07/03/16   0035  07/02/16   2311  07/02/16   1727   07/02/16   0325   07/01/16   0055   NA  142  142  139   < >  137   < >  135*   K  4.6  4.5  4.5   < >  5.6*   < >  3.7   CL  104  103  101   < >  100   < >  97*   CO2  31  30  30    < >  24   < >  26   GLU  146*  137*  219*   < >  188*   < >  313*   BUN  54*  54*  48*   < >  43*   < >  35*   CREA  1.78*  1.73*  1.84*   < >  1.90*   < >  1.44   MG   --    --    --    --   2.0   --   2.2   PHOS   --    --    --    --   2.1*   --    --     < > = values in this interval not displayed.      Recent Labs      07/03/16   0320  07/02/16   0300  07/01/16   1040   PH  7.47*  7.45  7.41   PCO2  45  44  44   PO2  68*  154*  73*   HCO3  31*  30*  28*       Cultures: NTD    Assessment:     Patient Active Problem List   Diagnosis Code   ??? 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 Alcohol abuse F10.10   ??? HTN (hypertension) I10   ??? Gastric ulcer K25.9   ??? Hepatitis C B19.20   ??? 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 Hyponatremia E87.1   ??? COPD exacerbation (HCC) J44.1   ??? Hypoxia R09.02   ??? Underweight R63.6   ??? Lactic acidosis E87.2   ??? ETOH abuse F10.10   ??? Acute hypoxemic respiratory failure (HCC) J96.01   ??? Protein calorie malnutrition (HCC) E46   ??? Elevated troponin R74.8   ??? AKI (acute kidney injury) (HCC) N17.9   ??? Thrombocytopenia (HCC) D69.6   ??? CAP (community acquired pneumonia) J18.9   ??? Acute systolic heart failure (HCC) I50.21   ??? Acute deep vein thrombosis (DVT) of right upper extremity (HCC) I82.621     Patient with ongoing severe hypoxic  respiratory failure, intubated. Primarily due to PNA on setting of severe bullous emphysema. Complicated by acute systolic heart failure and acute renal failure. Patient has multi system organ dysfunction. Will likely be very difficult to extubate. Now also with RUE DVT. Cannot anticoagulate for ongoing thrombocytopenia.     Plan     --severe bullous emphysema and acute respiratory failure. On solumedrol and BD. No wheeze, weaning steroids. Keep vent pressures low. Not ready for extubation.   -repeat LFT's tomorrow to check on liver function in setting of low plts.   -- abx for PNA: on vancomycin and zosyn. Had bronch with cultures pending.   --Worsening RUE edema. Doppler+ for DVT. Start heparin gtt when plts >100  --wean O2 as tolerated  -high residuals. Will start reglan.     Cecilio Asper, MD    More than 50% of time documented was spent in face-to-face contact with  the patient and in the care of the patient on the floor/unit where the patient is located.

## 2016-07-03 NOTE — Progress Notes (Signed)
Bedside and Verbal shift change report given to Annabelle Harman RN (oncoming nurse) by Amedeo Gory RN (offgoing nurse). Report included the following information SBAR, Kardex, ED Summary, Procedure Summary, Intake/Output, MAR, Recent Results, Med Rec Status and Cardiac Rhythm SR/ST up to 120's with agitation. Eyes open no distress BP better presently.

## 2016-07-03 NOTE — Progress Notes (Signed)
Tube feeding placed on hold presently for high residuals.

## 2016-07-03 NOTE — Progress Notes (Signed)
Bedside shift change report given to [facility] RN (oncoming nurse) by Lind Covert RN (offgoing nurse). Report included the following information SBAR, Kardex, Intake/Output, MAR, Recent Results, Med Rec Status and Cardiac Rhythm 1st Degree HB and ST.

## 2016-07-03 NOTE — Progress Notes (Signed)
Ventilator check complete; patient has a #7.5 ET tube secured at the 23 at the lip.  Patient is sedated.  Patient is not able to follow commands.  Breath sounds are diminished.  Trachea is midline, Negative for subcutaneous air, and chest excursion is symmetric. Patient is also Negative for cyanosis and is Negative for pitting edema.  All alarms are set and audible.  Resuscitation bag is at the head of the bed.      Ventilator Settings  Mode FIO2 Rate Tidal Volume Pressure PEEP I:E Ratio   PRVC  66 %    400 ml     10 cm H20  1:2.00      Peak airway pressure: 34.8 cm H2O   Minute ventilation: 9.2 l/min     ABG:   Recent Labs      07/03/16   0320  07/02/16   0300  07/01/16   1040   PH  7.47*  7.45  7.41   PCO2  45  44  44   PO2  68*  154*  73*   HCO3  31*  30*  28*         Riesa Pope, RT

## 2016-07-03 NOTE — Progress Notes (Signed)
Ventilator check complete; patient has a #7.5 ET tube secured at the 23 at the lip.   Patient is not able to follow commands.  Breath sounds are diminished.  Trachea is midline, Negative for subcutaneous air, and chest excursion is symmetric. Patient is also Negative for cyanosis and is Negative for pitting edema.  All alarms are set and audible.  Resuscitation bag is at the head of the bed.      Ventilator Settings  Mode FIO2 Rate Tidal Volume Pressure PEEP I:E Ratio   PRVC  71 % (increasing to 75% post ABG)    400 ml     10 cm H20  1:2.00      Peak airway pressure: 23.5 cm H2O   Minute ventilation: 7.9 l/min     ABG:   Recent Labs      07/03/16   0320  07/02/16   0300  07/01/16   1040   PH  7.47*  7.45  7.41   PCO2  45  44  44   PO2  68*  154*  73*   HCO3  31*  30*  28*         Michael S Lupinski, RT

## 2016-07-03 NOTE — Progress Notes (Signed)
Buttocks care given and applied protective barrier and clear antifungal ointment applied to buttocks.

## 2016-07-03 NOTE — Progress Notes (Signed)
Dr Greer here to see patient POC discussed.

## 2016-07-04 ENCOUNTER — Inpatient Hospital Stay: Admit: 2016-07-04 | Payer: MEDICAID | Primary: Family Medicine

## 2016-07-04 LAB — CBC W/O DIFF
HCT: 29.1 % — ABNORMAL LOW (ref 41.1–50.3)
HCT: 30.4 % — ABNORMAL LOW (ref 41.1–50.3)
HGB: 9.6 g/dL — ABNORMAL LOW (ref 13.6–17.2)
HGB: 10.3 g/dL — ABNORMAL LOW (ref 13.6–17.2)
MCH: 32.4 PG (ref 26.1–32.9)
MCH: 32.7 PG (ref 26.1–32.9)
MCHC: 33 g/dL (ref 31.4–35.0)
MCHC: 33.9 g/dL (ref 31.4–35.0)
MCV: 98.3 FL — ABNORMAL HIGH (ref 79.6–97.8)
MCV: 96.5 FL (ref 79.6–97.8)
MPV: 10.8 FL (ref 10.8–14.1)
MPV: 10.6 FL — ABNORMAL LOW (ref 10.8–14.1)
PLATELET: 105 10*3/uL — ABNORMAL LOW (ref 150–450)
PLATELET: 110 10*3/uL — ABNORMAL LOW (ref 150–450)
RBC: 2.96 M/uL — ABNORMAL LOW (ref 4.23–5.67)
RBC: 3.15 M/uL — ABNORMAL LOW (ref 4.23–5.67)
RDW: 13.8 % (ref 11.9–14.6)
RDW: 14 % (ref 11.9–14.6)
WBC: 6.8 10*3/uL (ref 4.3–11.1)
WBC: 8.5 10*3/uL (ref 4.3–11.1)

## 2016-07-04 LAB — CBC WITH AUTOMATED DIFF
ABS. BASOPHILS: 0 10*3/uL (ref 0.0–0.2)
ABS. EOSINOPHILS: 0 10*3/uL (ref 0.0–0.8)
ABS. IMM. GRANS.: 0.1 10*3/uL (ref 0.0–0.5)
ABS. LYMPHOCYTES: 1.2 10*3/uL (ref 0.5–4.6)
ABS. MONOCYTES: 1.3 10*3/uL (ref 0.1–1.3)
ABS. NEUTROPHILS: 14.5 10*3/uL — ABNORMAL HIGH (ref 1.7–8.2)
BASOPHILS: 0 % (ref 0.0–2.0)
EOSINOPHILS: 0 % — ABNORMAL LOW (ref 0.5–7.8)
HCT: 24.4 % — ABNORMAL LOW (ref 41.1–50.3)
HGB: 8.4 g/dL — ABNORMAL LOW (ref 13.6–17.2)
IMMATURE GRANULOCYTES: 0.6 % (ref 0.0–5.0)
LYMPHOCYTES: 7 % — ABNORMAL LOW (ref 13–44)
MCH: 31.7 PG (ref 26.1–32.9)
MCHC: 34.4 g/dL (ref 31.4–35.0)
MCV: 92.1 FL (ref 79.6–97.8)
MONOCYTES: 8 % (ref 4.0–12.0)
MPV: 11.7 FL (ref 10.8–14.1)
NEUTROPHILS: 84 % — ABNORMAL HIGH (ref 43–78)
PLATELET: 275 10*3/uL (ref 150–450)
RBC: 2.65 M/uL — ABNORMAL LOW (ref 4.23–5.67)
RDW: 13.8 % (ref 11.9–14.6)
WBC: 17.1 10*3/uL — ABNORMAL HIGH (ref 4.3–11.1)

## 2016-07-04 LAB — GLUCOSE, POC
Glucose (POC): 163 mg/dL — ABNORMAL HIGH (ref 65–100)
Glucose (POC): 156 mg/dL — ABNORMAL HIGH (ref 65–100)
Glucose (POC): 246 mg/dL — ABNORMAL HIGH (ref 65–100)
Glucose (POC): 240 mg/dL — ABNORMAL HIGH (ref 65–100)

## 2016-07-04 LAB — METABOLIC PANEL, BASIC
Anion gap: 11 mmol/L (ref 7–16)
BUN: 54 MG/DL — ABNORMAL HIGH (ref 6–23)
CO2: 29 mmol/L (ref 21–32)
Calcium: 7.9 MG/DL — ABNORMAL LOW (ref 8.3–10.4)
Chloride: 106 mmol/L (ref 98–107)
Creatinine: 1.47 MG/DL (ref 0.8–1.5)
GFR est AA: >60 mL/min/{1.73_m2} (ref >60)
GFR est non-AA: 53 mL/min/{1.73_m2} — ABNORMAL LOW (ref >60)
Glucose: 152 mg/dL — ABNORMAL HIGH (ref 65–100)
Potassium: 4.5 mmol/L (ref 3.5–5.1)
Sodium: 146 mmol/L — ABNORMAL HIGH (ref 136–145)

## 2016-07-04 LAB — BLOOD GAS, ARTERIAL
ALLENS TEST: POSITIVE
Arterial O2 Hgb: 96.8 % (ref 94.0–97.0)
BASE EXCESS: 5.5 mmol/L — ABNORMAL HIGH (ref 0–3)
BICARBONATE: 31 mmol/L — ABNORMAL HIGH (ref 22.0–26.0)
CARBOXYHEMOGLOBIN: 1.1 % (ref 0.5–1.5)
DEOXYHEMOGLOBIN: 2 % (ref 0.0–5.0)
FIO2: 55 %
METHEMOGLOBIN: 0.1 % (ref 0.0–1.5)
O2 SAT: 98 % (ref 92.0–98.5)
PCO2: 47 mmHg — ABNORMAL HIGH (ref 35.0–45.0)
PEEP/CPAP: 10
PO2: 105 mmHg — ABNORMAL HIGH (ref 75.0–100.0)
RATE: 20
TOTAL HEMOGLOBIN: 10.1 GM/DL — ABNORMAL LOW (ref 11.7–15.0)
Tidal volume: 400
pH: 7.43 (ref 7.35–7.45)

## 2016-07-04 LAB — HEPATIC FUNCTION PANEL
A-G Ratio: 0.6 — ABNORMAL LOW (ref 1.2–3.5)
ALT (SGPT): 109 U/L — ABNORMAL HIGH (ref 12–65)
AST (SGOT): 146 U/L — ABNORMAL HIGH (ref 15–37)
Albumin: 1.8 g/dL — ABNORMAL LOW (ref 3.5–5.0)
Alk. phosphatase: 99 U/L (ref 50–136)
Bilirubin, direct: 0.3 MG/DL (ref <0.4)
Bilirubin, total: 0.8 MG/DL (ref 0.2–1.1)
Globulin: 3.1 g/dL (ref 2.3–3.5)
Protein, total: 4.9 g/dL — ABNORMAL LOW (ref 6.3–8.2)

## 2016-07-04 LAB — PTT: aPTT: 35.2 s — ABNORMAL HIGH (ref 23.5–31.7)

## 2016-07-04 LAB — VANCOMYCIN, RANDOM: Vancomycin, random: 16.2 UG/ML

## 2016-07-04 MED ORDER — HEPARIN (PORCINE) IN D5W 25,000 UNIT/500 ML IV
25000 unit/500 mL (50 unit/mL) | INTRAVENOUS | Status: DC
Start: 2016-07-04 — End: 2016-07-06
  Administered 2016-07-04 – 2016-07-05 (×6): via INTRAVENOUS

## 2016-07-04 MED ORDER — METHYLPREDNISOLONE (PF) 40 MG/ML IJ SOLR
40 mg/mL | Freq: Two times a day (BID) | INTRAMUSCULAR | Status: DC
Start: 2016-07-04 — End: 2016-07-09
  Administered 2016-07-05 – 2016-07-09 (×10): via INTRAVENOUS

## 2016-07-04 MED ORDER — HEPARIN (PORCINE) 5,000 UNIT/ML IJ SOLN
5000 unit/mL | Freq: Once | INTRAMUSCULAR | Status: AC
Start: 2016-07-04 — End: 2016-07-04
  Administered 2016-07-04: 17:00:00 via INTRAVENOUS

## 2016-07-04 MED ORDER — VANCOMYCIN IN 0.9 % SODIUM CHLORIDE 1.25 GRAM/250 ML IV
1.25 gram/250 mL | INTRAVENOUS | Status: DC
Start: 2016-07-04 — End: 2016-07-06
  Administered 2016-07-04 – 2016-07-05 (×2): via INTRAVENOUS

## 2016-07-04 MED ORDER — PIPERACILLIN-TAZOBACTAM 3.375 GRAM IV SOLR
3.375 gram | Freq: Three times a day (TID) | INTRAVENOUS | Status: DC
Start: 2016-07-04 — End: 2016-07-06
  Administered 2016-07-04 – 2016-07-06 (×7): via INTRAVENOUS

## 2016-07-04 MED FILL — ALBUTEROL SULFATE 0.083 % (0.83 MG/ML) SOLN FOR INHALATION: 2.5 mg /3 mL (0.083 %) | RESPIRATORY_TRACT | Qty: 1

## 2016-07-04 MED FILL — MIDAZOLAM 5 MG/ML IJ SOLN: 5 mg/mL | INTRAMUSCULAR | Qty: 20

## 2016-07-04 MED FILL — METOCLOPRAMIDE 5 MG/ML IJ SOLN: 5 mg/mL | INTRAMUSCULAR | Qty: 2

## 2016-07-04 MED FILL — SOLU-MEDROL (PF) 125 MG/2 ML SOLUTION FOR INJECTION: 125 mg/2 mL | INTRAMUSCULAR | Qty: 2

## 2016-07-04 MED FILL — FAMOTIDINE (PF) 20 MG/2 ML IV: 20 mg/2 mL | INTRAVENOUS | Qty: 2

## 2016-07-04 MED FILL — PIPERACILLIN-TAZOBACTAM 3.375 GRAM IV SOLR: 3.375 gram | INTRAVENOUS | Qty: 3.38

## 2016-07-04 MED FILL — VANCOMYCIN IN 0.9 % SODIUM CHLORIDE 1.25 GRAM/250 ML IV: 1.25 gram/250 mL | INTRAVENOUS | Qty: 250

## 2016-07-04 MED FILL — FENTANYL (PF) 25 MCG/ML IN 0.9 % SODIUM CHLORIDE INJECTION: 25 mcg/mL | INTRAMUSCULAR | Qty: 100

## 2016-07-04 MED FILL — AMLODIPINE 5 MG TAB: 5 mg | ORAL | Qty: 1

## 2016-07-04 MED FILL — SOLU-MEDROL (PF) 40 MG/ML SOLUTION FOR INJECTION: 40 mg/mL | INTRAMUSCULAR | Qty: 1

## 2016-07-04 MED FILL — NICOTINE 14 MG/24 HR DAILY PATCH: 14 mg/24 hr | TRANSDERMAL | Qty: 1

## 2016-07-04 MED FILL — BUDESONIDE 0.5 MG/2 ML NEB SUSPENSION: 0.5 mg/2 mL | RESPIRATORY_TRACT | Qty: 1

## 2016-07-04 MED FILL — HEPARIN (PORCINE) 5,000 UNIT/ML IJ SOLN: 5000 unit/mL | INTRAMUSCULAR | Qty: 1

## 2016-07-04 MED FILL — HEPARIN (PORCINE) IN D5W 25,000 UNIT/500 ML IV: 25000 unit/500 mL (50 unit/mL) | INTRAVENOUS | Qty: 500

## 2016-07-04 NOTE — Progress Notes (Signed)
Spoke to Dr Criselda Peaches regarding Dr Vevelyn Francois order for Heparin and CBC. Results of redrawn CBC given to Dr Criselda Peaches. Orders given to start Heparin drip.

## 2016-07-04 NOTE — Progress Notes (Signed)
Heparin , fentanyl and versed verified and documented

## 2016-07-04 NOTE — Progress Notes (Signed)
Brett Becker  Admission Date: 06/21/2016             ICU Daily Progress Note: 07/04/2016     55 y/o with male admitted with COPD exacerbation, respiratory failure and etoh abuse. On bipap but required intubation 7/19 for worsening hypoxia. While intubated required bronch with mucus plugs removed. ??    Subjective:     Patient remains intubated. Coming down on FiO2 needs. No additional issues overnight.     Current Facility-Administered Medications   Medication Dose Route Frequency   ??? amLODIPine (NORVASC) tablet 5 mg  5 mg Oral DAILY   ??? metoclopramide HCl (REGLAN) injection 5 mg  5 mg IntraVENous Q6H   ??? vancomycin (VANCOCIN) 1250 mg in NS 250 ml infusion  1,250 mg IntraVENous See Admin Instructions   ??? piperacillin-tazobactam (ZOSYN) 3.375 g in 0.9% sodium chloride (MBP/ADV) 50 mL MBP  3.375 g IntraVENous Q8H   ??? famotidine (PF) (PEPCID) 20 mg in sodium chloride 0.9 % 10 mL injection  20 mg IntraVENous Q24H   ??? methylPREDNISolone (PF) (SOLU-MEDROL) injection 60 mg  60 mg IntraVENous Q12H   ??? lidocaine (XYLOCAINE) 4 % (40 mg/mL) topical solution   Topical PRN   ??? albuterol (PROVENTIL VENTOLIN) nebulizer solution 2.5 mg  2.5 mg Nebulization Q4H RT   ??? budesonide (PULMICORT) 500 mcg/2 ml nebulizer suspension  500 mcg Nebulization BID RT   ??? fentaNYL in normal saline (pf) 25 mcg/mL infusion  25-200 mcg/hr IntraVENous TITRATE   ??? midazolam (VERSED) 100 mg in 0.9% sodium chloride 100 mL infusion  1-10 mg/hr IntraVENous TITRATE   ??? insulin regular (NOVOLIN R, HUMULIN R) injection   SubCUTAneous Q6H   ??? NUTRITIONAL SUPPORT ELECTROLYTE PRN ORDERS   Does Not Apply PRN   ??? LORazepam (ATIVAN) injection 0.5 mg  0.5 mg IntraVENous Q3H PRN   ??? metoprolol (LOPRESSOR) injection 5 mg  5 mg IntraVENous Q4H PRN   ??? sodium chloride (NS) flush 5-10 mL  5-10 mL IntraVENous Q8H   ??? sodium chloride (NS) flush 5-10 mL  5-10 mL IntraVENous PRN   ??? nicotine (NICODERM CQ) 14 mg/24 hr patch 1 Patch  1 Patch TransDERmal DAILY         Objective:      Vitals:    07/04/16 0343 07/04/16 0423 07/04/16 0824 07/04/16 0900   BP:   (!) 152/92    Pulse: 96  93 94   Resp: 20   27   Temp:       SpO2: 100%   100%   Weight:  126 lb 8.7 oz (57.4 kg)     Height:         Intake and Output:   07/21 1901 - 07/23 0700  In: 2201.8 [I.V.:632.8]  Out: 2050 [Urine:1450; Drains:600]       Physical Exam:          GEN: acutely ill, intubated  HEENT:  peerl, intubated  NECK:  no JVD, no retractions, no thyromegaly or masses,   LUNGS:  Clear anteriorly, decreased bases  HEART:  RRR with no M,G,R;  ABDOMEN:  soft, nt, nd, decreased BS  EXTREMITIES:  warm with no cyanosis, no lower leg edema. 1+ B UE edema, R>L with blisters  SKIN:  no jaundice or ecchymosis   NEURO:  sedated    LINES:  IV, foley, ETT  DRIPS: fentanyl  NUTRITION: TF    Ventilator Settings  Mode FIO2 Rate Tidal Volume Pressure PEEP   PRVC  51 %    400 ml     10 cm H20      Peak airway pressure: 18.1 cm H2O   Minute ventilation: 8.5 l/min       CHEST XRAY:   07/04/16  IMPRESSION:  ??  1. Midlung airspace disease has improved.  ??  2. Bullous disease at the lung bases.      LAB  Recent Labs      07/04/16   0330   WBC  6.8   HGB  9.6*   HCT  29.1*   PLT  105*     Recent Labs      07/04/16   0330  07/03/16   0035  07/02/16   2311   07/02/16   0325   NA  146*  142  142   < >  137   K  4.5  4.6  4.5   < >  5.6*   CL  106  104  103   < >  100   CO2  29  31  30    < >  24   GLU  152*  146*  137*   < >  188*   BUN  54*  54*  54*   < >  43*   CREA  1.47  1.78*  1.73*   < >  1.90*   MG   --    --    --    --   2.0   PHOS   --    --    --    --   2.1*    < > = values in this interval not displayed.     Recent Labs      07/04/16   0330  07/03/16   0320  07/02/16   0300   PH  7.43  7.47*  7.45   PCO2  47*  45  44   PO2  105*  68*  154*   HCO3  31*  31*  30*       Cultures: NTD    Assessment:     Patient Active Problem List   Diagnosis Code   ??? 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 Alcohol abuse F10.10   ??? HTN (hypertension) I10   ??? Gastric ulcer K25.9    ??? Hepatitis C B19.20   ??? 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 Hyponatremia E87.1   ??? COPD exacerbation (HCC) J44.1   ??? Hypoxia R09.02   ??? Underweight R63.6   ??? Lactic acidosis E87.2   ??? ETOH abuse F10.10   ??? Acute hypoxemic respiratory failure (HCC) J96.01   ??? Protein calorie malnutrition (HCC) E46   ??? Elevated troponin R74.8   ??? AKI (acute kidney injury) (HCC) N17.9   ??? Thrombocytopenia (HCC) D69.6   ??? CAP (community acquired pneumonia) J18.9   ??? Acute systolic heart failure (HCC) I50.21   ??? Acute deep vein thrombosis (DVT) of right upper extremity (HCC) I82.621     Patient with improving hypoxic respiratory failure, intubated. Down to 40% and 8 PEEP today. Primarily due to PNA on setting of severe bullous emphysema. Complicated by acute systolic heart failure and acute renal failure. Creatining coming down.  Now also with RUE DVT. Plts just coming up high enough to start heparin gtt.       Plan   -wean sedation. See if ready  for SBT  -start heparin gtt for UE DVT. Monitor plts.   --severe bullous emphysema and acute respiratory failure. On solumedrol and BD. No wheeze, weaning steroids: drop to 40 today. Keep vent pressures low.   -repeat LFT's tomorrow to check on liver function in setting of low plts.   -- abx for PNA: on vancomycin and zosyn. Had bronch with cultures pending.   --Worsening RUE edema. Doppler+ for DVT. Start heparin gtt now that plts >100  -high residuals. Started reglan.     Cecilio Asper, MD    More than 50% of time documented was spent in face-to-face contact with the patient and in the care of the patient on the floor/unit where the patient is located.

## 2016-07-04 NOTE — Progress Notes (Signed)
Bedside and Verbal shift change report given to Annabelle Harman RN (oncoming nurse) by Amedeo Gory RN (offgoing nurse). Report included the following information SBAR, Kardex, Intake/Output, MAR and Accordion.

## 2016-07-04 NOTE — Progress Notes (Signed)
Buttocks care has been done twice this shift. Applied protective barrier cream at area. flexiseal remains intact.

## 2016-07-04 NOTE — Progress Notes (Signed)
Ventilator check complete; patient has a #7.5 ET tube secured at the 23 at the lip.  Patient is sedated.  Patient is not able to follow commands.  Breath sounds are diminished.  Trachea is midline, Negative for subcutaneous air, and chest excursion is symmetric. Patient is also Negative for cyanosis and is Negative for pitting edema.  All alarms are set and audible.  Resuscitation bag is at the head of the bed.      Ventilator Settings  Mode FIO2 Rate Tidal Volume Pressure PEEP I:E Ratio   PRVC  41 %    400 ml     8 cm H20  1:2.00      Peak airway pressure: 21.6 cm H2O   Minute ventilation: 9.3 l/min     ABG:   Recent Labs      07/04/16   0330  07/03/16   0320  07/02/16   0300   PH  7.43  7.47*  7.45   PCO2  47*  45  44   PO2  105*  68*  154*   HCO3  31*  31*  30*         Riesa Pope, RT

## 2016-07-04 NOTE — Progress Notes (Signed)
Pts girlfriend came in crying and acting as if she was in distress.  Stating she was looking for [place].  Took her to pt room.  She did not inquire about pt but was crying as if this was the first she new about him being here. Informed her he was stable and doing better.Pts heart is elevated 110and his breathing began to use more accessory muscles, SATS decreased to 92 so rate was increased on his versed to 5 mcgs.  Discussed with her the need to let him rest and use less stimulation.   She stated she understood and sat quietly by his bedside

## 2016-07-04 NOTE — Progress Notes (Signed)
Patient has a #7.5 ET tube secured at the 23 at the lip.  Breath sounds are diminished.  Trachea is midline and chest excursion is symmetric.  Patient does have peripheral pulses.  Patients skin is pink and cool to the touch.  Patients is Negative for edema.Ventilator check was performed and all alarms are set and audible.  Safety rails are up and resuscitation bag is at the head of bed.      Ventilator Settings  Mode FIO2 Rate Tidal Volume Pressure PEEP I:E Ratio   PRVC  56 %    400 ml     10 cm H20  1:2.00      Peak airway pressure: 25.4 cm H2O   Minute ventilation: 8.5 l/min     ABG:   Recent Labs      07/04/16   0330  07/03/16   0320  07/02/16   0300   PH  7.43  7.47*  7.45   PCO2  47*  45  44   PO2  105*  68*  154*   HCO3  31*  31*  30*

## 2016-07-04 NOTE — Progress Notes (Signed)
Bedside report received from [facility]. VSS Pt turned and repositioned skin assessed

## 2016-07-04 NOTE — Progress Notes (Signed)
Resp at bedside making adjustment on vent Sats 91%

## 2016-07-05 ENCOUNTER — Inpatient Hospital Stay: Admit: 2016-07-05 | Payer: MEDICAID | Primary: Family Medicine

## 2016-07-05 LAB — METABOLIC PANEL, BASIC
Anion gap: 9 mmol/L (ref 7–16)
BUN: 53 MG/DL — ABNORMAL HIGH (ref 6–23)
CO2: 31 mmol/L (ref 21–32)
Calcium: 7.9 MG/DL — ABNORMAL LOW (ref 8.3–10.4)
Chloride: 107 mmol/L (ref 98–107)
Creatinine: 1.3 MG/DL (ref 0.8–1.5)
GFR est AA: >60 mL/min/{1.73_m2} (ref >60)
GFR est non-AA: >60 mL/min/{1.73_m2} (ref >60)
Glucose: 230 mg/dL — ABNORMAL HIGH (ref 65–100)
Potassium: 3.9 mmol/L (ref 3.5–5.1)
Sodium: 147 mmol/L — ABNORMAL HIGH (ref 136–145)

## 2016-07-05 LAB — MAGNESIUM: Magnesium: 2.1 mg/dL (ref 1.8–2.4)

## 2016-07-05 LAB — BLOOD GAS, ARTERIAL
ALLENS TEST: POSITIVE
Arterial O2 Hgb: 93.8 % — ABNORMAL LOW (ref 94.0–97.0)
BASE EXCESS: 4.9 mmol/L — ABNORMAL HIGH (ref 0–3)
BICARBONATE: 28 mmol/L — ABNORMAL HIGH (ref 22.0–26.0)
CARBOXYHEMOGLOBIN: 0.3 % — ABNORMAL LOW (ref 0.5–1.5)
DEOXYHEMOGLOBIN: 5 % (ref 0.0–5.0)
FIO2: 40 %
METHEMOGLOBIN: 0.9 % (ref 0.0–1.5)
O2 SAT: 95 % (ref 92.0–98.5)
PCO2: 38 mmHg (ref 35.0–45.0)
PEEP/CPAP: 8
PO2: 80 mmHg (ref 75.0–100.0)
RATE: 20
TOTAL HEMOGLOBIN: 10 GM/DL — ABNORMAL LOW (ref 11.7–15.0)
Tidal volume: 400
pH: 7.49 — ABNORMAL HIGH (ref 7.35–7.45)

## 2016-07-05 LAB — GLUCOSE, POC
Glucose (POC): 244 mg/dL — ABNORMAL HIGH (ref 65–100)
Glucose (POC): 213 mg/dL — ABNORMAL HIGH (ref 65–100)
Glucose (POC): 202 mg/dL — ABNORMAL HIGH (ref 65–100)

## 2016-07-05 LAB — CBC W/O DIFF
HCT: 27.8 % — ABNORMAL LOW (ref 41.1–50.3)
HGB: 9.2 g/dL — ABNORMAL LOW (ref 13.6–17.2)
MCH: 32.4 PG (ref 26.1–32.9)
MCHC: 33.1 g/dL (ref 31.4–35.0)
MCV: 97.9 FL — ABNORMAL HIGH (ref 79.6–97.8)
MPV: 10.4 FL — ABNORMAL LOW (ref 10.8–14.1)
PLATELET: 121 10*3/uL — ABNORMAL LOW (ref 150–450)
RBC: 2.84 M/uL — ABNORMAL LOW (ref 4.23–5.67)
RDW: 14.3 % (ref 11.9–14.6)
WBC: 8.8 10*3/uL (ref 4.3–11.1)

## 2016-07-05 LAB — CULTURE, RESPIRATORY/SPUTUM/BRONCH W GRAM STAIN
GRAM STAIN: 0
GRAM STAIN: NONE SEEN
GRAM STAIN: NONE SEEN

## 2016-07-05 LAB — PTT
aPTT: 65.5 s — ABNORMAL HIGH (ref 23.5–31.7)
aPTT: 70.3 s — ABNORMAL HIGH (ref 23.5–31.7)

## 2016-07-05 LAB — PHOSPHORUS: Phosphorus: 3.8 MG/DL (ref 2.5–4.5)

## 2016-07-05 MED ORDER — METOPROLOL TARTRATE 5 MG/5 ML IV SOLN
5 mg/ mL | Freq: Four times a day (QID) | INTRAVENOUS | Status: DC | PRN
Start: 2016-07-05 — End: 2016-07-09
  Administered 2016-07-07 – 2016-07-08 (×3): via INTRAVENOUS

## 2016-07-05 MED ORDER — FUROSEMIDE 10 MG/ML IJ SOLN
10 mg/mL | Freq: Once | INTRAMUSCULAR | Status: AC
Start: 2016-07-05 — End: 2016-07-05
  Administered 2016-07-05: 16:00:00 via INTRAVENOUS

## 2016-07-05 MED FILL — VANCOMYCIN IN 0.9 % SODIUM CHLORIDE 1.25 GRAM/250 ML IV: 1.25 gram/250 mL | INTRAVENOUS | Qty: 250

## 2016-07-05 MED FILL — ALBUTEROL SULFATE 0.083 % (0.83 MG/ML) SOLN FOR INHALATION: 2.5 mg /3 mL (0.083 %) | RESPIRATORY_TRACT | Qty: 1

## 2016-07-05 MED FILL — METOCLOPRAMIDE 5 MG/ML IJ SOLN: 5 mg/mL | INTRAMUSCULAR | Qty: 2

## 2016-07-05 MED FILL — PIPERACILLIN-TAZOBACTAM 3.375 GRAM IV SOLR: 3.375 gram | INTRAVENOUS | Qty: 3.38

## 2016-07-05 MED FILL — MIDAZOLAM 5 MG/ML IJ SOLN: 5 mg/mL | INTRAMUSCULAR | Qty: 20

## 2016-07-05 MED FILL — FENTANYL (PF) 25 MCG/ML IN 0.9 % SODIUM CHLORIDE INJECTION: 25 mcg/mL | INTRAMUSCULAR | Qty: 100

## 2016-07-05 MED FILL — NICOTINE 14 MG/24 HR DAILY PATCH: 14 mg/24 hr | TRANSDERMAL | Qty: 1

## 2016-07-05 MED FILL — BUDESONIDE 0.5 MG/2 ML NEB SUSPENSION: 0.5 mg/2 mL | RESPIRATORY_TRACT | Qty: 1

## 2016-07-05 MED FILL — FAMOTIDINE (PF) 20 MG/2 ML IV: 20 mg/2 mL | INTRAVENOUS | Qty: 2

## 2016-07-05 MED FILL — AMLODIPINE 5 MG TAB: 5 mg | ORAL | Qty: 1

## 2016-07-05 MED FILL — HEPARIN (PORCINE) IN D5W 25,000 UNIT/500 ML IV: 25000 unit/500 mL (50 unit/mL) | INTRAVENOUS | Qty: 500

## 2016-07-05 MED FILL — FUROSEMIDE 10 MG/ML IJ SOLN: 10 mg/mL | INTRAMUSCULAR | Qty: 4

## 2016-07-05 MED FILL — SOLU-MEDROL (PF) 40 MG/ML SOLUTION FOR INJECTION: 40 mg/mL | INTRAMUSCULAR | Qty: 1

## 2016-07-05 NOTE — Progress Notes (Addendum)
Brett Becker  Admission Date: 06/13/2016             Daily Progress Note: 07/05/2016     55 y/o with male admitted with COPD exacerbation, respiratory failure and etoh/ cocaine abuse. On bipap but required intubation 7/19 for worsening hypoxia. While intubated required bronch with mucus plugs removed. ??  ??  Subjective:     Hypertensive on vent  Severe bullous emphysema. His daughter spoke with PC and he is now a DNR.     Review of Systems  Review of systems not obtained due to patient factors. sedated and mechanically ventilated    Current Facility-Administered Medications   Medication Dose Route Frequency   ??? methylPREDNISolone (PF) (SOLU-MEDROL) injection 40 mg  40 mg IntraVENous Q12H   ??? heparin 25,000 units in dextrose 500 mL infusion  18-36 Units/kg/hr IntraVENous TITRATE   ??? vancomycin (VANCOCIN) 1250 mg in NS 250 ml infusion  1,250 mg IntraVENous Q24H   ??? amLODIPine (NORVASC) tablet 5 mg  5 mg Oral DAILY   ??? metoclopramide HCl (REGLAN) injection 5 mg  5 mg IntraVENous Q6H   ??? piperacillin-tazobactam (ZOSYN) 3.375 g in 0.9% sodium chloride (MBP/ADV) 50 mL MBP  3.375 g IntraVENous Q8H   ??? famotidine (PF) (PEPCID) 20 mg in sodium chloride 0.9 % 10 mL injection  20 mg IntraVENous Q24H   ??? lidocaine (XYLOCAINE) 4 % (40 mg/mL) topical solution   Topical PRN   ??? albuterol (PROVENTIL VENTOLIN) nebulizer solution 2.5 mg  2.5 mg Nebulization Q4H RT   ??? budesonide (PULMICORT) 500 mcg/2 ml nebulizer suspension  500 mcg Nebulization BID RT   ??? fentaNYL in normal saline (pf) 25 mcg/mL infusion  25-200 mcg/hr IntraVENous TITRATE   ??? midazolam (VERSED) 100 mg in 0.9% sodium chloride 100 mL infusion  1-10 mg/hr IntraVENous TITRATE   ??? insulin regular (NOVOLIN R, HUMULIN R) injection   SubCUTAneous Q6H   ??? NUTRITIONAL SUPPORT ELECTROLYTE PRN ORDERS   Does Not Apply PRN   ??? LORazepam (ATIVAN) injection 0.5 mg  0.5 mg IntraVENous Q3H PRN   ??? metoprolol (LOPRESSOR) injection 5 mg  5 mg IntraVENous Q4H PRN    ??? sodium chloride (NS) flush 5-10 mL  5-10 mL IntraVENous Q8H   ??? sodium chloride (NS) flush 5-10 mL  5-10 mL IntraVENous PRN   ??? nicotine (NICODERM CQ) 14 mg/24 hr patch 1 Patch  1 Patch TransDERmal DAILY         Objective:     Vitals:    07/05/16 0801 07/05/16 0804 07/05/16 0900 07/05/16 0919   BP: 144/77  154/88    Pulse: (!) 106 (!) 111 (!) 111 (!) 120   Resp: 17 18 20     Temp:       SpO2: 97% 98% 96%    Weight:       Height:         Intake and Output:   07/22 1901 - 07/24 0700  In: 2789.2 [I.V.:1058.2]  Out: 2825 [Urine:2300; Drains:525]  07/24 0701 - 07/24 1900  In: 30   Out: -     Physical Exam:          Constitutional: the patient is well develop  HEENT: Sclera clear, pupils equal, oral mucosa moist  Lungs: scattered, diffuse rhonchi on vent  Cardiovascular: RRR without M,G,R  Abd/GI: soft and non-tender; with positive bowel sounds.  Ext: warm without cyanosis. There is no lower leg edema.  Musculoskeletal: moves all four extremities with equal  strength  Skin: no jaundice or rashes, no wounds   Neuro: no gross neuro deficits       Lines/Drains: IV, ETT, foley  DRIPS: fentanyl   Nutrition: TF, residuals > 150 ml    Ventilator Settings  Mode FIO2 Rate Tidal Volume Pressure PEEP   PRVC  46 %    400 ml     8 cm H20      Peak airway pressure: 13.2 cm H2O   Minute ventilation: 9.8 l/min       CHEST XRAY:       LAB  Recent Labs      07/05/16   0400  07/04/16   1549  07/04/16   1130   WBC  8.8  8.5  17.1*   HGB  9.2*  10.3*  8.4*   HCT  27.8*  30.4*  24.4*   PLT  121*  110*  275     Recent Labs      07/05/16   0400  07/04/16   0330  07/03/16   0035   NA  147*  146*  142   K  3.9  4.5  4.6   CL  107  106  104   CO2  GLU  230*  152*  146*   BUN  53*  54*  54*   CREA  1.30  1.47  1.78*   MG  2.1   --    --    PHOS  3.8   --    --      Recent Labs      07/05/16   0328  07/04/16   0330  07/03/16   0320   PH  7.49*  7.43  7.47*   PCO2  38  47*  45   PO2  80  105*  68*   HCO3  28*  31*  31*      Culture result: SCANT CANDIDA KRUSEI (A)       Assessment:     Patient Active Problem List   Diagnosis Code   ??? 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 Alcohol abuse F10.10   ??? HTN (hypertension) I10   ??? Gastric ulcer K25.9   ??? Hepatitis C B19.20   ??? 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 Hyponatremia E87.1   ??? COPD exacerbation (HCC) J44.1   ??? Hypoxia R09.02   ??? Underweight R63.6   ??? Lactic acidosis E87.2   ??? ETOH abuse F10.10   ??? Acute hypoxemic respiratory failure (HCC) J96.01   ??? Protein calorie malnutrition (HCC) E46   ??? Elevated troponin R74.8   ??? AKI (acute kidney injury) (HCC) N17.9   ??? Thrombocytopenia (HCC) D69.6   ??? CAP (community acquired pneumonia) J18.9   ??? Acute systolic heart failure (HCC) I50.21   ??? Acute deep vein thrombosis (DVT) of right upper extremity [facility]) I82.621       Plan     Hospital Problems  Date Reviewed: 07/07/2016          Codes Class Noted POA    Acute deep vein thrombosis (DVT) of right upper extremity (HCC) ICD-10-CM: Z61.096  ICD-9-CM: 453.82  07/03/2016 Yes        Acute systolic heart failure (HCC) ICD-10-CM: I50.21  ICD-9-CM: 428.21  07/02/2016 Yes        CAP (community acquired pneumonia) ICD-10-CM: J18.9  ICD-9-CM: 486  06/29/2016 Yes    abx D 8, improved    COPD exacerbation (HCC) ICD-10-CM: J44.1  ICD-9-CM: 491.21  07/03/16 Yes    Steroids, BD    Lactic acidosis ICD-10-CM: E87.2  ICD-9-CM: 276.2  07-03-16 Yes        ETOH abuse (Chronic) ICD-10-CM: F10.10  ICD-9-CM: 305.00  07/03/16 Yes        * (Principal)Acute hypoxemic respiratory failure (HCC) ICD-10-CM: J96.01  ICD-9-CM: 518.81  2016/07/03 Yes    Weaning     Protein calorie malnutrition (HCC) ICD-10-CM: E46  ICD-9-CM: 263.9  07/03/16 Yes        Elevated troponin ICD-10-CM: R74.8  ICD-9-CM: 790.6  07/03/2016 Yes        AKI (acute kidney injury) (HCC) ICD-10-CM: N17.9  ICD-9-CM: 584.9  07-03-16 Yes    resolved    Thrombocytopenia (HCC) ICD-10-CM: D69.6   ICD-9-CM: 287.5  07/03/2016 Yes        Bullous emphysema- severe (Chronic) ICD-10-CM: J43.9  ICD-9-CM: 492.0  10/15/2013 Yes            -- now on heparin for RUE DVT. Plt > 100,000 in the setting of liver disease  -- RASS goal of 0. PS trials as able  -- severe bullous emphysema and acute respiratory failure with PNA and AECOPD. On abx D 8 of vancomycin and zosyn.  Stop Abx soon  -- Solu-medrol 40 mg Q 12 hours  -- control HTN. Lasix X 1 dose  -- he is now a DNR.     Aram Candela, NP    More than 50% of time documented was spent in face-to-face contact with the patient and in the care of the patient on the floor/unit where the patient is located.     The patient has been seen and examined by me personally, the chart,labs, and radiographic studies have been reviewed.    Chest: Coarse bilaterally  Extremities: 2+ edema    I agree with the above assessment and plan.    Earley Brooke MD.

## 2016-07-05 NOTE — Other (Signed)
Interdisciplinary team rounds were held 07/05/2016 with the following team members:Care Management, Nursing, Nurse Practitioner, Nutrition, Palliative Care, Pastoral Care, Pharmacy, Physical Therapy, Physician, Respiratory Therapy, Speech Therapy and Clinical Coordinator.    Plan of care discussed. See clinical pathway and/or care plan for interventions and desired outcomes.

## 2016-07-05 NOTE — Progress Notes (Signed)
Palliative Care Progress Note    Patient: Brett Becker MRN: [id number]  SSN: [id number]    Date of Birth: July 29, 1961  Age: 55 y.o.  Sex: male       Assessment/Plan:     Chief Complaint/Interval History: remains sedated, intubated and ventilated       Principal Diagnosis:    ?? Debility, Unspecified  R53.81    Additional Diagnoses:   ?? Failure to Thrive  R62.7  ?? Fatigue, Lethargy  R53.83  ?? Frailty  R54  ?? Respiratory Failure, Acute on Chronic  J96.20  ?? Encounter for Palliative Care  Z51.5    Palliative Performance Scale (PPS)  PPS: 30    Medical Decision Making:   Reviewed and summarized notes over last 24 hours   Discussed case with appropriate providers- ICU IDT  Reviewed laboratory and x-ray data- BMP    Pt sedated, intubated and ventilated.  Spoke to patient's daughter [name] by phone.  Updated her on medical condition of ongoing respiratory failure and Pulmonary opinion of being concerned that he could not wean off ventilator.   I asked her to consider patient's wishes if he were not able to wean from ventilator and assured her of attention to patient's comfort if ventilator was to be withdrawn.  We also discussed code status, and she stated that if patient were to arrest despite all current measures, she does not desire defibrillation, CPR, etc., DNR order placed.   Will continue to follow.     Will discuss findings with members of the interdisciplinary team.         More than 50% of this 25 minute visit was spent counseling and coordination of care as outlined above.    Subjective:     Review of Systems:  Review of systems not obtained due to patient factors- sedated, intubated and ventilated      Objective:     Visit Vitals   ??? BP (!) 161/102   ??? Pulse (!) 120   ??? Temp 98.8 ??F (37.1 ??C)   ??? Resp 18   ??? Ht 5\' 8"  (1.727 m)   ??? Wt 60 kg (132 lb 4.4 oz)   ??? SpO2 98%   ??? BMI 20.11 kg/m2       Physical Exam:    General:  Sedated, intubated and ventilated. No acute distress.  Thin, frail    Eyes:  Conjunctivae/corneas clear    Nose: Nares normal. Septum midline. DH feeding tube   Neck: Supple, symmetrical, trachea midline   Lungs:   Decreased bilaterally   Heart:  Regular rate and rhythm   Abdomen:   non-distended   Extremities: BUE edema   Skin: Scrotal edema   Neurologic: Eyes open, does not respond   Psych: Sedated      Signed By: Consuello Closs., MD     July 05, 2016

## 2016-07-05 NOTE — Progress Notes (Signed)
Pt is resting comfortably after FiO2 was increased.  No other changes have been made VSS , pt is no longer using accessory muscles

## 2016-07-05 NOTE — Progress Notes (Signed)
Spiritual Care visit. Follow up visit.    Patient is now DNR; not yet off the ventilator.  Chaplain noted that his eyes were open, and he was looking around.  Spoke with members of his family, and prayed for patient's health.    Visit by Arelia Sneddon, M.Ed., Th.B. ,Staff Chaplain

## 2016-07-05 NOTE — Progress Notes (Signed)
Ventilator check complete; patient has a #7.5 ET tube secured at the 22 at the lip.  Patient is not sedated.  Patient is able to follow commands.  Breath sounds are diminished.  Trachea is midline, Negative for subcutaneous air, and chest excursion is symmetric. Patient is also Negative for cyanosis and is Negative for pitting edema.  All alarms are set and audible.  Resuscitation bag is at the head of the bed.      Ventilator Settings  Mode FIO2 Rate Tidal Volume Pressure PEEP I:E Ratio   Pressure support  42 %    400 ml  8 cm H2O  8 cm H20  1:2.00      Peak airway pressure: 17.6 cm H2O   Minute ventilation: 8.8 l/min     ABG:   Recent Labs      07/05/16   0328  07/04/16   0330  07/03/16   0320   PH  7.49*  7.43  7.47*   PCO2  38  47*  45   PO2  80  105*  68*   HCO3  28*  31*  31*         Angela M [place], RT

## 2016-07-05 NOTE — Progress Notes (Signed)
Buttocks care has been done BID protective barrier applied to excoriated areas.

## 2016-07-05 NOTE — Progress Notes (Signed)
Bedside and Verbal shift change report given to Annabelle Harman RN (oncoming nurse) by Amedeo Gory RN (offgoing nurse). Report included the following information SBAR, Kardex, Intake/Output, MAR, Recent Results, Med Rec Status and Cardiac Rhythm ST 122.

## 2016-07-05 NOTE — Progress Notes (Signed)
Daughter and wife at bedside presently. Update given.

## 2016-07-05 NOTE — Progress Notes (Signed)
Pt given complete bed bath and linen change.  Pt BP elevated during bath and wound care.  Pt is  On sedation.  UOP 950 , rectal tube 200 ml this shift. Tube feeding residuals less than 100 this shift .  Will continue to monitor

## 2016-07-05 NOTE — Progress Notes (Signed)
Problem: Ventilator Management  Goal: *Adequate oxygenation and ventilation  Outcome: Not Met  Ventilator check complete; patient has a #7.5 ET tube secured at the 23 at the lip.  Patient is sedated.  Patient is not able to follow commands.  Breath sounds are diminished.  Trachea is midline , Negative for subcutaneous air, and chest excursion is symmetric. Patient is also Negative for cyanosis and is Positive for pitting edema.  All alarms are set and audible.  Resuscitation bag is at the head of the bed.       Ventilator Settings  Mode FIO2 Rate Tidal Volume Pressure PEEP I:E Ratio   PRVC  46 %    400 ml     8 cm H20  1:2.00       Peak airway pressure: 13.2 cm H2O   Minute ventilation: 9.8 l/min      ABG:   Recent Labs      07/05/16   0328  07/04/16   0330  07/03/16   0320   PH  7.49*  7.43  7.47*   PCO2  38  47*  45   PO2  80  105*  68*   HCO3  28*  31*  31*           Candice R [place]

## 2016-07-05 NOTE — Progress Notes (Signed)
Bedside report and dual med sign off completed with [facility] RN

## 2016-07-05 NOTE — Progress Notes (Signed)
Dr Tamsen Roers on floor seeing pt's.  Discussed the elevated heart rate and BP.  New orders for Lopressor 5 mg q6 hrs received

## 2016-07-05 NOTE — Progress Notes (Signed)
Sedation vacation failed HR up 128 BP 171/102 labored breathing and tremors noted

## 2016-07-06 ENCOUNTER — Inpatient Hospital Stay: Admit: 2016-07-06 | Payer: MEDICAID | Primary: Family Medicine

## 2016-07-06 LAB — CBC W/O DIFF
HCT: 27.5 % — ABNORMAL LOW (ref 41.1–50.3)
HGB: 9 g/dL — ABNORMAL LOW (ref 13.6–17.2)
MCH: 32.8 PG (ref 26.1–32.9)
MCHC: 32.7 g/dL (ref 31.4–35.0)
MCV: 100.4 FL — ABNORMAL HIGH (ref 79.6–97.8)
MPV: 11.1 FL (ref 10.8–14.1)
PLATELET: 116 10*3/uL — ABNORMAL LOW (ref 150–450)
RBC: 2.74 M/uL — ABNORMAL LOW (ref 4.23–5.67)
RDW: 14.9 % — ABNORMAL HIGH (ref 11.9–14.6)
WBC: 9.9 10*3/uL (ref 4.3–11.1)

## 2016-07-06 LAB — PTT: aPTT: >110 s — CR (ref 23.5–31.7)

## 2016-07-06 LAB — BLOOD GAS, ARTERIAL
ALLENS TEST: POSITIVE
Arterial O2 Hgb: 91.9 % — ABNORMAL LOW (ref 94.0–97.0)
BASE EXCESS: 5.4 mmol/L — ABNORMAL HIGH (ref 0–3)
BICARBONATE: 30 mmol/L — ABNORMAL HIGH (ref 22.0–26.0)
CARBOXYHEMOGLOBIN: 0.5 % (ref 0.5–1.5)
DEOXYHEMOGLOBIN: 7 % — ABNORMAL HIGH (ref 0.0–5.0)
FIO2: 40 %
METHEMOGLOBIN: 0.7 % (ref 0.0–1.5)
O2 SAT: 93 % (ref 92.0–98.5)
PCO2: 44 mmHg (ref 35.0–45.0)
PEEP/CPAP: 8
PO2: 74 mmHg — ABNORMAL LOW (ref 75.0–100.0)
TOTAL HEMOGLOBIN: 9.9 GM/DL — ABNORMAL LOW (ref 11.7–15.0)
pH: 7.45 (ref 7.35–7.45)

## 2016-07-06 LAB — METABOLIC PANEL, BASIC
Anion gap: 6 mmol/L — ABNORMAL LOW (ref 7–16)
BUN: 50 MG/DL — ABNORMAL HIGH (ref 6–23)
CO2: 36 mmol/L — ABNORMAL HIGH (ref 21–32)
Calcium: 7.5 MG/DL — ABNORMAL LOW (ref 8.3–10.4)
Chloride: 108 mmol/L — ABNORMAL HIGH (ref 98–107)
Creatinine: 1.28 MG/DL (ref 0.8–1.5)
GFR est AA: >60 mL/min/{1.73_m2} (ref >60)
GFR est non-AA: >60 mL/min/{1.73_m2} (ref >60)
Glucose: 185 mg/dL — ABNORMAL HIGH (ref 65–100)
Potassium: 4.1 mmol/L (ref 3.5–5.1)
Sodium: 150 mmol/L — ABNORMAL HIGH (ref 136–145)

## 2016-07-06 LAB — GLUCOSE, POC
Glucose (POC): 179 mg/dL — ABNORMAL HIGH (ref 65–100)
Glucose (POC): 207 mg/dL — ABNORMAL HIGH (ref 65–100)
Glucose (POC): 183 mg/dL — ABNORMAL HIGH (ref 65–100)
Glucose (POC): 222 mg/dL — ABNORMAL HIGH (ref 65–100)

## 2016-07-06 MED ORDER — HEPARIN, PORCINE (PF) 100 UNIT/ML IV SYRINGE
100 unit/mL | Freq: Three times a day (TID) | INTRAVENOUS | Status: DC
Start: 2016-07-06 — End: 2016-07-09
  Administered 2016-07-06 – 2016-07-09 (×8)

## 2016-07-06 MED ORDER — SODIUM CHLORIDE 0.9 % IJ SYRG
INTRAMUSCULAR | Status: DC | PRN
Start: 2016-07-06 — End: 2016-07-11

## 2016-07-06 MED ORDER — METOCLOPRAMIDE 5 MG/ML IJ SOLN
5 mg/mL | Freq: Four times a day (QID) | INTRAMUSCULAR | Status: DC | PRN
Start: 2016-07-06 — End: 2016-07-09

## 2016-07-06 MED ORDER — SODIUM CHLORIDE 0.9 % IJ SYRG
Freq: Three times a day (TID) | INTRAMUSCULAR | Status: DC
Start: 2016-07-06 — End: 2016-07-11
  Administered 2016-07-06 – 2016-07-11 (×15)

## 2016-07-06 MED ORDER — FUROSEMIDE 10 MG/ML IJ SOLN
10 mg/mL | Freq: Once | INTRAMUSCULAR | Status: AC
Start: 2016-07-06 — End: 2016-07-06
  Administered 2016-07-06: 17:00:00 via INTRAVENOUS

## 2016-07-06 MED ORDER — HEPARIN, PORCINE (PF) 100 UNIT/ML IV SYRINGE
100 unit/mL | INTRAVENOUS | Status: DC | PRN
Start: 2016-07-06 — End: 2016-07-09

## 2016-07-06 MED FILL — METOPROLOL TARTRATE 5 MG/5 ML IV SOLN: 5 mg/ mL | INTRAVENOUS | Qty: 5

## 2016-07-06 MED FILL — BUDESONIDE 0.5 MG/2 ML NEB SUSPENSION: 0.5 mg/2 mL | RESPIRATORY_TRACT | Qty: 1

## 2016-07-06 MED FILL — SOLU-MEDROL (PF) 40 MG/ML SOLUTION FOR INJECTION: 40 mg/mL | INTRAMUSCULAR | Qty: 1

## 2016-07-06 MED FILL — ALBUTEROL SULFATE 0.083 % (0.83 MG/ML) SOLN FOR INHALATION: 2.5 mg /3 mL (0.083 %) | RESPIRATORY_TRACT | Qty: 1

## 2016-07-06 MED FILL — METOCLOPRAMIDE 5 MG/ML IJ SOLN: 5 mg/mL | INTRAMUSCULAR | Qty: 2

## 2016-07-06 MED FILL — PIPERACILLIN-TAZOBACTAM 3.375 GRAM IV SOLR: 3.375 gram | INTRAVENOUS | Qty: 3.38

## 2016-07-06 MED FILL — FUROSEMIDE 10 MG/ML IJ SOLN: 10 mg/mL | INTRAMUSCULAR | Qty: 4

## 2016-07-06 MED FILL — AMLODIPINE 5 MG TAB: 5 mg | ORAL | Qty: 1

## 2016-07-06 MED FILL — NICOTINE 14 MG/24 HR DAILY PATCH: 14 mg/24 hr | TRANSDERMAL | Qty: 1

## 2016-07-06 MED FILL — FAMOTIDINE (PF) 20 MG/2 ML IV: 20 mg/2 mL | INTRAVENOUS | Qty: 2

## 2016-07-06 MED FILL — MONOJECT PREFILL ADVANCED (PF) 100 UNIT/ML INTRAVENOUS SYRINGE: 100 unit/mL | INTRAVENOUS | Qty: 6

## 2016-07-06 NOTE — Progress Notes (Signed)
Ventilator check complete; patient has a #7.5 ET tube secured at the 22 at the lip. Patient is not able to follow commands.  Breath sounds are coarse.  Trachea is midline, Negative for subcutaneous air, and chest excursion is symmetric. Patient is also Negative for cyanosis and is Negative for pitting edema.  All alarms are set and audible.  Resuscitation bag is at the head of the bed.      Ventilator Settings  Mode FIO2 Rate Tidal Volume Pressure PEEP I:E Ratio   Pressure support  45 % (weaned to 45%)    400 ml  5 cm H2O  10 cm H20  1:2.00      Peak airway pressure: 16.6 cm H2O   Minute ventilation: 6.2 l/min     ABG:   Recent Labs      07/06/16   0310  07/05/16   0328  07/04/16   0330   PH  7.45  7.49*  7.43   PCO2  44  38  47*   PO2  74*  80  105*   HCO3  30*  28*  31*         [name] P Lollis, RT

## 2016-07-06 NOTE — Progress Notes (Signed)
Dr Mullen here to see patient POC discussed. Sedation vacation done.

## 2016-07-06 NOTE — Progress Notes (Signed)
Ventilator check complete; patient has a #7.5 ET tube secured at the 22 at the lip.  Patient is sedated.  Patient is not able to follow commands.  Breath sounds are coarse.  Trachea is midline, Negative for subcutaneous air, and chest excursion is symmetric. Patient is also Negative for cyanosis and is Positive for pitting edema.  All alarms are set and audible.  Resuscitation bag is at the head of the bed.      Ventilator Settings  Mode FIO2 Rate Tidal Volume Pressure PEEP I:E Ratio   Pressure support  46 %     5 cm H2O  10 cm H20  1:2.00      Peak airway pressure: 15.3 cm H2O   Minute ventilation: 7.8 l/min     ABG:   Recent Labs      07/06/16   0310  07/05/16   0328  07/04/16   0330   PH  7.45  7.49*  7.43   PCO2  44  38  47*   PO2  74*  80  105*   HCO3  30*  28*  31*         Liana Crocker, RT

## 2016-07-06 NOTE — Other (Signed)
Interdisciplinary team rounds were held 07/06/2016 with the following team members:Care Management, Nursing, Nurse Practitioner, Nutrition, Palliative Care, Pastoral Care, Pharmacy, Physician, Respiratory Therapy and Clinical Coordinator.    Plan of care discussed. See clinical pathway and/or care plan for interventions and desired outcomes.

## 2016-07-06 NOTE — Progress Notes (Signed)
Late entry -06/29/2016 - Staff unable to locate family. Researched old CM notes and found, pt has daughter, Melbourne Whiteaker - [phone]/ and pt's aunt Raynelle Fanning - [phone] given to primary RN.

## 2016-07-06 NOTE — Progress Notes (Addendum)
Brett Becker  Admission Date: 06/23/2016             ICU Daily Progress Note: 07/06/2016     55 y/o with male admitted with COPD exacerbation, respiratory failure and etoh/ cocaine abuse. On bipap but required intubation 7/19 for worsening hypoxia. While intubated required bronch with mucus plugs removed.His daughter spoke with PC and he is now a DNR.    Subjective:     Severe bullous emphysema. Intubated for 7 days now.  Sedated, anxious at times per RN  On 50% fiO2.      Current Facility-Administered Medications   Medication Dose Route Frequency   ??? metoprolol (LOPRESSOR) injection 5 mg  5 mg IntraVENous Q6H PRN   ??? methylPREDNISolone (PF) (SOLU-MEDROL) injection 40 mg  40 mg IntraVENous Q12H   ??? heparin 25,000 units in dextrose 500 mL infusion  18-36 Units/kg/hr IntraVENous TITRATE   ??? vancomycin (VANCOCIN) 1250 mg in NS 250 ml infusion  1,250 mg IntraVENous Q24H   ??? amLODIPine (NORVASC) tablet 5 mg  5 mg Oral DAILY   ??? metoclopramide HCl (REGLAN) injection 5 mg  5 mg IntraVENous Q6H   ??? piperacillin-tazobactam (ZOSYN) 3.375 g in 0.9% sodium chloride (MBP/ADV) 50 mL MBP  3.375 g IntraVENous Q8H   ??? famotidine (PF) (PEPCID) 20 mg in sodium chloride 0.9 % 10 mL injection  20 mg IntraVENous Q24H   ??? lidocaine (XYLOCAINE) 4 % (40 mg/mL) topical solution   Topical PRN   ??? albuterol (PROVENTIL VENTOLIN) nebulizer solution 2.5 mg  2.5 mg Nebulization Q4H RT   ??? budesonide (PULMICORT) 500 mcg/2 ml nebulizer suspension  500 mcg Nebulization BID RT   ??? fentaNYL in normal saline (pf) 25 mcg/mL infusion  25-200 mcg/hr IntraVENous TITRATE   ??? midazolam (VERSED) 100 mg in 0.9% sodium chloride 100 mL infusion  1-10 mg/hr IntraVENous TITRATE   ??? insulin regular (NOVOLIN R, HUMULIN R) injection   SubCUTAneous Q6H   ??? NUTRITIONAL SUPPORT ELECTROLYTE PRN ORDERS   Does Not Apply PRN   ??? LORazepam (ATIVAN) injection 0.5 mg  0.5 mg IntraVENous Q3H PRN   ??? metoprolol (LOPRESSOR) injection 5 mg  5 mg IntraVENous Q4H PRN    ??? sodium chloride (NS) flush 5-10 mL  5-10 mL IntraVENous Q8H   ??? sodium chloride (NS) flush 5-10 mL  5-10 mL IntraVENous PRN   ??? nicotine (NICODERM CQ) 14 mg/24 hr patch 1 Patch  1 Patch TransDERmal DAILY         Objective:     Vitals:    07/06/16 0418 07/06/16 0500 07/06/16 0815 07/06/16 0854   BP:  145/88     Pulse:  92 99    Resp:  10 10    Temp:       SpO2:  96% 97% 90%   Weight: 121 lb 14.6 oz (55.3 kg)      Height:         Intake and Output:   07/23 1901 - 07/25 0700  In: 4064.3 [I.V.:1492.3]  Out: 4850 [Urine:4250; Drains:600]       Physical Exam:          GEN: acutely ill,   HEENT:  eyes closed  NECK:  no JVD, no retractions, no thyromegaly or masses,   LUNGS:  Few rhonchi on vent  HEART:  RRR with no M,G,R;  ABDOMEN:  soft; positive bowel sounds present, + diarrhea  EXTREMITIES:  warm with no cyanosis, no lower leg edema  SKIN:  no jaundice or ecchymosis   NEURO:  sedated on vent    LINES: ETT, IV, rectal tube, foley  DRIPS: fentanyl, versed  NUTRITION:tube feedings    Ventilator Settings  Mode FIO2 Rate Tidal Volume Pressure PEEP   Pressure support  50 % (increased to 50% due to low O2 saturation)    400 ml  8 cm H2O  10 cm H20 (autopeep 2. increased peep to 10. Dr. Criselda Peaches aware)      Peak airway pressure: 19.3 cm H2O   Minute ventilation: 6.3 l/min       CHEST XRAY:     LAB  Recent Labs      07/06/16   0415  07/05/16   0400  07/04/16   1549   WBC  9.9  8.8  8.5   HGB  9.0*  9.2*  10.3*   HCT  27.5*  27.8*  30.4*   PLT  116*  121*  110*     Recent Labs      07/06/16   0415  07/05/16   0400  07/04/16   0330   NA  150*  147*  146*   K  4.1  3.9  4.5   CL  108*  107  106   CO2  36*  31  29   GLU  185*  230*  152*   BUN  50*  53*  54*   CREA  1.28  1.30  1.47   MG   --   2.1   --    PHOS   --   3.8   --      Recent Labs      07/06/16   0310  07/05/16   0328  07/04/16   0330   PH  7.45  7.49*  7.43   PCO2  44  38  47*   PO2  74*  80  105*   HCO3  30*  28*  31*       Cultures:    Culture result: SCANT CANDIDA KRUSEI (A)     CT 2014            Assessment:     Patient Active Problem List   Diagnosis Code   ??? 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 Alcohol abuse F10.10   ??? HTN (hypertension) I10   ??? Gastric ulcer K25.9   ??? Hepatitis C B19.20   ??? 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 Hyponatremia E87.1   ??? COPD exacerbation (HCC) J44.1   ??? Hypoxia R09.02   ??? Underweight R63.6   ??? Lactic acidosis E87.2   ??? ETOH abuse F10.10   ??? Acute hypoxemic respiratory failure (HCC) J96.01   ??? Protein calorie malnutrition (HCC) E46   ??? Elevated troponin R74.8   ??? AKI (acute kidney injury) (HCC) N17.9   ??? Thrombocytopenia (HCC) D69.6   ??? CAP (community acquired pneumonia) J18.9   ??? Acute systolic heart failure (HCC) I50.21   ??? Acute deep vein thrombosis (DVT) of right upper extremity [facility]) I82.621       Plan     Hospital Problems  Date Reviewed: 06-29-16          Codes Class Noted POA    Acute deep vein thrombosis (DVT) of right upper extremity (HCC) ICD-10-CM: Z61.096  ICD-9-CM: 453.82  07/03/2016 Yes    IMPRESSION: Nonocclusive thrombus in the right forearm cephalic  vein, central  right subclavian vein, and right brachiocephalic vein.    Acute systolic heart failure (HCC) ICD-10-CM: I50.21  ICD-9-CM: 428.21  07/02/2016 Yes        CAP (community acquired pneumonia) ICD-10-CM: J18.9  ICD-9-CM: 486  06/29/2016 Yes    S/p abx    COPD exacerbation (HCC) ICD-10-CM: J44.1  ICD-9-CM: 491.21  07/05/2016 Yes    Better     Lactic acidosis ICD-10-CM: E87.2  ICD-9-CM: 276.2  06/18/2016 Yes        ETOH abuse (Chronic) ICD-10-CM: F10.10  ICD-9-CM: 305.00  07/10/2016 Yes        * (Principal)Acute hypoxemic respiratory failure (HCC) ICD-10-CM: J96.01  ICD-9-CM: 518.81  06/16/2016 Yes            AKI (acute kidney injury) (HCC) ICD-10-CM: N17.9  ICD-9-CM: 584.9  07/05/2016 Yes    stable    Thrombocytopenia (HCC) ICD-10-CM: D69.6  ICD-9-CM: 287.5  06/27/2016 Yes    Worse with ETOH use     Bullous emphysema- severe (Chronic) ICD-10-CM: J43.9  ICD-9-CM: 492.0  10/15/2013 Yes              -- change reglan to PRN with diarrhea  -- stop Abx  --BD, steroids Q 12 hours  -- wean FiO2, RASS goal 0  --stop heparin- nonocclusive throbmus with thrombocytopenia      Aram Candela, NP    More than 50% of time documented was spent in face-to-face contact with the patient and in the care of the patient on the floor/unit where the patient is located.   Lungs:  rhonchi  Heart:  RRR with no Murmur/Rubs/Gallops    Additional Comments:  Repeat lasix , decrease pressure support    I have spoken with and examined the patient. I agree with the above assessment and plan as documented.    [name], MD

## 2016-07-06 NOTE — Progress Notes (Signed)
Chart reviewed. Pt currently intubated and on vent. Palliative care following and pt made DNR. CM will continue to follow for any assist.

## 2016-07-06 NOTE — Progress Notes (Signed)
PICC PLACEMENT NOTE    PRE-PROCEDURE VERIFICATION    Correct Patient: Yes (Time out preformed)ROSANNA ARANGUIZ RN IN AGREEMENT WITH TIME OUT.  Consent Procedure: Yes  Appropriate Site: Yes    Temperature: Temp: 97.9 ??F (36.6 ??C), Temperature Source: Temp Source: Axillary    Recent Labs      07/06/16   0415   BUN  50*   CREA  1.28   PLT  116*   WBC  9.9       Allergies: Codeine    Education materials for PICC Care given to family: yes. See Patient Education activity for further details.  PICC Booklet placed on bedside table.    PROCEDURE DETAIL  A double lumen PICC line was started for vascular access. The following documentation is in addition to the PICC properties in the lines/airways flowsheet :  Lot #: [id number]  xylocaine used: yes  Mid-Arm Circumference: 25 (cm)  Internal Catheter Length: 45 (cm)  Internal Catheter Total Length: 45 (cm)  Vein Selection for PICC:left basilic  Central Line Bundle followed yes  Complication Related to Insertion: none  Ports flushed with positive blood return in each port.  Guidewires removed intact.    The placement was verified by ecg technology: yes. The ecg technology results state the tip location is on the left side and the tip overlies the lower  superior vena cava. Primary nurse notified.    Line is okay to use: yes      Herbert l Robert Bellow, RN

## 2016-07-06 NOTE — Progress Notes (Signed)
Spoke with [name], daughter on phone is concerned that her dad is suffering. Informed her that we will have the doctor speak with her tomorrow.

## 2016-07-06 NOTE — Progress Notes (Signed)
Palliative Care Progress Note    Patient: Brett Becker MRN: [id number]  SSN: [id number]    Date of Birth: [birth year]  Age: 55 y.o.  Sex: male       Assessment/Plan:     Chief Complaint/Interval History: on pressure support       Principal Diagnosis:    ?? Debility, Unspecified  R53.81    Additional Diagnoses:   ?? Failure to Thrive  R62.7  ?? Fatigue, Lethargy  R53.83  ?? Frailty  R54  ?? Respiratory Failure, Acute on Chronic  J96.20  ?? Encounter for Palliative Care  Z51.5    Palliative Performance Scale (PPS)  PPS: 30    Medical Decision Making:   Reviewed and summarized notes over last 24 hours   Discussed case with appropriate providers- ICU IDT  Reviewed laboratory and x-ray data- CBC, BMP, ABG    Plans today are to wean sedation and attempt extubation.    Will continue to follow.     Will discuss findings with members of the interdisciplinary team.         More than 50% of this 15 minute visit was spent counseling and coordination of care as outlined above.    Subjective:     Review of Systems:  Review of systems not obtained due to patient factors- sedated, intubated and ventilated      Objective:     Visit Vitals   ??? BP (!) 150/95   ??? Pulse (!) 103   ??? Temp 98.8 ??F (37.1 ??C)   ??? Resp 10   ??? Ht 5\' 8"  (1.727 m)   ??? Wt 55.3 kg (121 lb 14.6 oz)   ??? SpO2 90%   ??? BMI 18.54 kg/m2       Physical Exam:    General:   No acute distress.  Thin, frail   Eyes:  Conjunctivae/corneas clear    Nose: Nares normal. Septum midline. DH feeding tube   Neck: Supple, symmetrical, trachea midline   Lungs:   Decreased bilaterally, on pressure support   Heart:  Regular rate and rhythm   Abdomen:   non-distended   Extremities: BUE edema   Skin: Scrotal edema   Neurologic: Opens eyes to voice   Psych: unable     Signed By: Consuello Closs., MD     July 06, 2016

## 2016-07-06 NOTE — Progress Notes (Signed)
Bedside and Verbal shift change report given to Boneta Lucks RN (oncoming nurse) by Amedeo Gory RN (offgoing nurse). Report included the following information SBAR, Kardex, ED Summary, Procedure Summary, Intake/Output, MAR, Recent Results, Med Rec Status and Cardiac Rhythm ST 110.

## 2016-07-07 ENCOUNTER — Inpatient Hospital Stay: Admit: 2016-07-07 | Payer: MEDICAID | Primary: Family Medicine

## 2016-07-07 LAB — MAGNESIUM: Magnesium: 1.9 mg/dL (ref 1.8–2.4)

## 2016-07-07 LAB — CBC W/O DIFF
HCT: 26.3 % — ABNORMAL LOW (ref 41.1–50.3)
HGB: 8.6 g/dL — ABNORMAL LOW (ref 13.6–17.2)
MCH: 33.2 PG — ABNORMAL HIGH (ref 26.1–32.9)
MCHC: 32.7 g/dL (ref 31.4–35.0)
MCV: 101.5 FL — ABNORMAL HIGH (ref 79.6–97.8)
MPV: 10.7 FL — ABNORMAL LOW (ref 10.8–14.1)
PLATELET: 111 10*3/uL — ABNORMAL LOW (ref 150–450)
RBC: 2.59 M/uL — ABNORMAL LOW (ref 4.23–5.67)
RDW: 14.8 % — ABNORMAL HIGH (ref 11.9–14.6)
WBC: 12 10*3/uL — ABNORMAL HIGH (ref 4.3–11.1)

## 2016-07-07 LAB — GLUCOSE, POC
Glucose (POC): 212 mg/dL — ABNORMAL HIGH (ref 65–100)
Glucose (POC): 181 mg/dL — ABNORMAL HIGH (ref 65–100)
Glucose (POC): 185 mg/dL — ABNORMAL HIGH (ref 65–100)
Glucose (POC): 262 mg/dL — ABNORMAL HIGH (ref 65–100)

## 2016-07-07 LAB — BLOOD GAS, ARTERIAL
ALLENS TEST: POSITIVE
ALLENS TEST: POSITIVE
Arterial O2 Hgb: 93.1 % — ABNORMAL LOW (ref 94.0–97.0)
Arterial O2 Hgb: 90 % — ABNORMAL LOW (ref 94.0–97.0)
BASE EXCESS: 12.1 mmol/L — ABNORMAL HIGH (ref 0–3)
BASE EXCESS: 12.3 mmol/L — ABNORMAL HIGH (ref 0–3)
BICARBONATE: 38 mmol/L — ABNORMAL HIGH (ref 22.0–26.0)
BICARBONATE: 37 mmol/L — ABNORMAL HIGH (ref 22.0–26.0)
CARBOXYHEMOGLOBIN: 1.2 % (ref 0.5–1.5)
CARBOXYHEMOGLOBIN: 1.2 % (ref 0.5–1.5)
DEOXYHEMOGLOBIN: 6 % — ABNORMAL HIGH (ref 0.0–5.0)
DEOXYHEMOGLOBIN: 9 % — ABNORMAL HIGH (ref 0.0–5.0)
FIO2: 45 %
FIO2: 35 %
METHEMOGLOBIN: 0.2 % (ref 0.0–1.5)
METHEMOGLOBIN: 0.2 % (ref 0.0–1.5)
O2 SAT: 94 % (ref 92.0–98.5)
O2 SAT: 91 % — ABNORMAL LOW (ref 92.0–98.5)
PCO2: 54 mmHg — ABNORMAL HIGH (ref 35.0–45.0)
PCO2: 47 mmHg — ABNORMAL HIGH (ref 35.0–45.0)
PEEP/CPAP: 10
PEEP/CPAP: 8
PO2: 73 mmHg — ABNORMAL LOW (ref 75.0–100.0)
PO2: 61 mmHg — ABNORMAL LOW (ref 75.0–100.0)
TOTAL HEMOGLOBIN: 10.4 GM/DL — ABNORMAL LOW (ref 11.7–15.0)
TOTAL HEMOGLOBIN: 8.7 GM/DL — ABNORMAL LOW (ref 11.7–15.0)
pH: 7.46 — ABNORMAL HIGH (ref 7.35–7.45)
pH: 7.51 — ABNORMAL HIGH (ref 7.35–7.45)

## 2016-07-07 LAB — METABOLIC PANEL, BASIC
Anion gap: 5 mmol/L — ABNORMAL LOW (ref 7–16)
BUN: 54 MG/DL — ABNORMAL HIGH (ref 6–23)
CO2: 38 mmol/L — ABNORMAL HIGH (ref 21–32)
Calcium: 7.5 MG/DL — ABNORMAL LOW (ref 8.3–10.4)
Chloride: 107 mmol/L (ref 98–107)
Creatinine: 1.3 MG/DL (ref 0.8–1.5)
GFR est AA: >60 mL/min/{1.73_m2} (ref >60)
GFR est non-AA: >60 mL/min/{1.73_m2} (ref >60)
Glucose: 195 mg/dL — ABNORMAL HIGH (ref 65–100)
Potassium: 4.5 mmol/L (ref 3.5–5.1)
Sodium: 150 mmol/L — ABNORMAL HIGH (ref 136–145)

## 2016-07-07 LAB — PTT: aPTT: 24 s (ref 23.5–31.7)

## 2016-07-07 LAB — PHOSPHORUS: Phosphorus: 4.1 MG/DL (ref 2.5–4.5)

## 2016-07-07 MED ORDER — MORPHINE 10 MG/ML INJ SOLUTION
10 mg/ml | INTRAMUSCULAR | Status: DC | PRN
Start: 2016-07-07 — End: 2016-07-08

## 2016-07-07 MED ORDER — MORPHINE 2 MG/ML INJECTION
2 mg/mL | INTRAMUSCULAR | Status: DC | PRN
Start: 2016-07-07 — End: 2016-07-08
  Administered 2016-07-07: 18:00:00 via INTRAVENOUS

## 2016-07-07 MED ORDER — FUROSEMIDE 10 MG/ML IJ SOLN
10 mg/mL | Freq: Every day | INTRAMUSCULAR | Status: DC
Start: 2016-07-07 — End: 2016-07-09
  Administered 2016-07-08 – 2016-07-09 (×2): via INTRAVENOUS

## 2016-07-07 MED ORDER — AMLODIPINE 10 MG TAB
10 mg | Freq: Every day | ORAL | Status: DC
Start: 2016-07-07 — End: 2016-07-09
  Administered 2016-07-08: 14:00:00 via ORAL

## 2016-07-07 MED FILL — AMLODIPINE 5 MG TAB: 5 mg | ORAL | Qty: 1

## 2016-07-07 MED FILL — SOLU-MEDROL (PF) 40 MG/ML SOLUTION FOR INJECTION: 40 mg/mL | INTRAMUSCULAR | Qty: 1

## 2016-07-07 MED FILL — MONOJECT PREFILL ADVANCED (PF) 100 UNIT/ML INTRAVENOUS SYRINGE: 100 unit/mL | INTRAVENOUS | Qty: 6

## 2016-07-07 MED FILL — ALBUTEROL SULFATE 0.083 % (0.83 MG/ML) SOLN FOR INHALATION: 2.5 mg /3 mL (0.083 %) | RESPIRATORY_TRACT | Qty: 1

## 2016-07-07 MED FILL — METOPROLOL TARTRATE 5 MG/5 ML IV SOLN: 5 mg/ mL | INTRAVENOUS | Qty: 5

## 2016-07-07 MED FILL — LORAZEPAM 2 MG/ML IJ SOLN: 2 mg/mL | INTRAMUSCULAR | Qty: 1

## 2016-07-07 MED FILL — BUDESONIDE 0.5 MG/2 ML NEB SUSPENSION: 0.5 mg/2 mL | RESPIRATORY_TRACT | Qty: 1

## 2016-07-07 MED FILL — MIDAZOLAM 5 MG/ML IJ SOLN: 5 mg/mL | INTRAMUSCULAR | Qty: 20

## 2016-07-07 MED FILL — INSULIN REGULAR HUMAN 100 UNIT/ML INJECTION: 100 unit/mL | INTRAMUSCULAR | Qty: 1

## 2016-07-07 MED FILL — FENTANYL (PF) 25 MCG/ML IN 0.9 % SODIUM CHLORIDE INJECTION: 25 mcg/mL | INTRAMUSCULAR | Qty: 100

## 2016-07-07 MED FILL — MORPHINE 2 MG/ML INJECTION: 2 mg/mL | INTRAMUSCULAR | Qty: 1

## 2016-07-07 MED FILL — FAMOTIDINE (PF) 20 MG/2 ML IV: 20 mg/2 mL | INTRAVENOUS | Qty: 2

## 2016-07-07 MED FILL — NICOTINE 14 MG/24 HR DAILY PATCH: 14 mg/24 hr | TRANSDERMAL | Qty: 1

## 2016-07-07 NOTE — Progress Notes (Signed)
Problem: Nutrition Deficit  Goal: *Optimize nutritional status  Nutrition F/U:  TF Management for [facility].  Assessment:  Weight 56.8 kg (CCU bed 07/07/16).  The patient remains intubated, ventilated, NPO and TF-dependent. Labs are remarkable for AM glucose 195 and sodium 150; POC glucose (7/26) 212,181,185. He remains on Solumedrol 40 mg twice daily and his glucose is managed with SSI coverage (no h/o diabetes). He currently has a rectal tube.  Macronutrient Needs:  EER:  1425-1596 kcal /day (25-18 kcal/kg ABW of 57 kg)  Revised EPR:  68-84 grams protein/day (1.2-1.5 grams/kg/day)  Max CHO:  200 grams/day (50% kcal)  Fluid:  45ml/kcal  Intake/Comparative Standards:  Current KH:VFMBBUYZ 1.2 at 55 ml/hr with 17ml water q 1hr provides 1584 kcal/day (100% of needs), 79 grams protein/day (100% pro needs), 152 grams CHO/day (does not exceed max CHO), 68 meq K/d,and ~ free water/day (100% of needs).   Intervention:   Meals and Snacks: NPO.  Enteral Nutrition: Continue current TF and water flush.  Mineral Supplement Therapy: Nutrition Support Orders/Electrolyte Management Replacement Protocols are active on the MAR.   Coordination of Nutrition Care by a Nutrition Professional: AM CCU rounds, collaboration with Cammy Copa, Charity fundraiser.   Bettey Mare. Daphine Deutscher RD,LD,CNSC  [phone]

## 2016-07-07 NOTE — Progress Notes (Signed)
Patient's SBP sustaining above 190 after prn Lopressor administered. Dr. Vivi Ferns notified of patient status.  New orders for Hydralazine 10 mg IV q6hrprn for SBP >180 and DBP > 100.  Readback for clarification completed.

## 2016-07-07 NOTE — Progress Notes (Addendum)
Palliative Care Progress Note    Patient: Brett Becker MRN: [id number]  SSN: [id number]    Date of Birth: 02/05/1961  Age: 55 y.o.  Sex: male       Assessment/Plan:     Chief Complaint/Interval History: remains very weak with intermittent agitation with sedation vacations       Principal Diagnosis:    ?? Debility, Unspecified  R53.81    Additional Diagnoses:   ?? Failure to Thrive  R62.7  ?? Fatigue, Lethargy  R53.83  ?? Frailty  R54  ?? Respiratory Failure, Acute on Chronic  J96.20  ?? Encounter for Palliative Care  Z51.5    Palliative Performance Scale (PPS)  PPS: 30    Medical Decision Making:   Reviewed and summarized notes over last 24 hours   Discussed case with appropriate providers- ICU IDT  Reviewed laboratory and x-ray data- CBC, BMP, ABG, CXR    Nursing reports that daughter has communicated that she is worried patient is suffering.  Will plan to discuss with her.  Patient has been intubated for 8 days.   Discussed status with Pulmonary.  Case Management notes reviewed.     Will continue to follow.     Addendum:  Spoke to Dr. Criselda Becker who feels patient potentially could be extubated today.  Spoke to daughter Brett Becker by phone informing her of this.  I counseled her that this does not mean patient is "well" but that simply patient may tolerate being extubated at least for a period and that based on previous discussion,  if and when he declines again, he would not be placed back on ventilator unless patient able to express wishes otherwise.   Brett Becker is not available until approximately 6 pm and requests that extubation not be performed until she can be here.    Will discuss findings with members of the interdisciplinary team.         More than 50% of this 35 minute visit was spent counseling and coordination of care as outlined above.    Subjective:     Review of Systems:  Review of systems not obtained due to patient factors- sedated, intubated and ventilated      Objective:     Visit Vitals   ??? BP (!) 169/102    ??? Pulse (!) 106   ??? Temp 99.9 ??F (37.7 ??C)   ??? Resp 15   ??? Ht 5\' 8"  (1.727 m)   ??? Wt 56.8 kg (125 lb 3.5 oz)   ??? SpO2 93%   ??? BMI 19.04 kg/m2       Physical Exam:    General:   No acute distress.  Thin, frail   Eyes:  Conjunctivae/corneas clear    Nose: Nares normal. Septum midline. DH feeding tube   Neck: Supple, symmetrical, trachea midline   Lungs:   Decreased bilaterally, on pressure support   Heart:  Regular rate and rhythm   Abdomen:   non-distended   Extremities: BUE edema   Skin: Scrotal edema   Neurologic: Minimally responsive   Psych: unable     Signed By: Consuello Closs., MD     July 07, 2016

## 2016-07-07 NOTE — Progress Notes (Signed)
Ventilator check complete; patient has a #7.5 ET tube secured at the 22 at the teeth.  Patient is not sedated.    Breath sounds are coarse.  Trachea is midline, Negative for subcutaneous air, and chest excursion is symmetric. Patient is also Negative for cyanosis and is Negative for pitting edema.  All alarms are set and audible.  Resuscitation bag is at the head of the bed.      Ventilator Settings  Mode FIO2 Rate Tidal Volume Pressure PEEP I:E Ratio   Pressure support (for rest over night)  36 %     5 cm H2O  8 cm H20  1:2.00      Peak airway pressure: 12 cm H2O   Minute ventilation: 8.1 l/min     ABG:   Recent Labs      07/07/16   1800  07/07/16   0305  07/06/16   0310   PH  7.51*  7.46*  7.45   PCO2  47*  54*  44   PO2  61*  73*  74*   HCO3  37*  38*  30*         Ames Dura, RT

## 2016-07-07 NOTE — Progress Notes (Signed)
SBP sustaining above 190.  Call placed to Dr. Vivi Ferns.  Orders received to increase current order of Hydralazine to 20 mg IV q6hrprn and to give Hydralazine 10 mg IV now.  Readback for clarification.

## 2016-07-07 NOTE — Progress Notes (Signed)
Ventilator check complete; patient has a #7.5 ET tube secured at the 22 at the lip. Patient is able to follow commands.  Breath sounds are coarse.  Trachea is midline, Negative for subcutaneous air, and chest excursion is symmetric. Patient is also Negative for cyanosis and is Negative for pitting edema.  All alarms are set and audible.  Resuscitation bag is at the head of the bed.      Ventilator Settings  Mode FIO2 Rate Tidal Volume Pressure PEEP I:E Ratio   Pressure support  45 %     5 cm H2O  10 cm H20  1:2.00      Peak airway pressure: 15.4 cm H2O   Minute ventilation: 5.1 l/min     ABG:   Recent Labs      07/07/16   0305  07/06/16   0310  07/05/16   0328   PH  7.46*  7.45  7.49*   PCO2  54*  44  38   PO2  73*  74*  80   HCO3  38*  30*  28*         Stephanie A Vanderbrink, RT

## 2016-07-07 NOTE — Progress Notes (Signed)
Beside verbal report received from [place], [place].

## 2016-07-07 NOTE — Other (Signed)
Interdisciplinary team rounds were held 07/07/2016 with the following team members:Care Management, Nursing, Nurse Practitioner, Nutrition, Palliative Care, Pastoral Care, Pharmacy, Physical Therapy, Physician, Respiratory Therapy, Speech Therapy and Clinical Coordinator and the patient.    Plan of care discussed. See clinical pathway and/or care plan for interventions and desired outcomes.

## 2016-07-07 NOTE — Progress Notes (Signed)
Pt discussed with Dr. Criselda Peaches regarding possible LTAC. Spoke with Aram Beecham, liaison regarding payor source of straight MCD. They do not cover LTAC services. [facility] is not an option for d/c plan. Pt will most likely need STR at d/c. Will need LOC/complex LOC prior. Currently remains intubated and on vent.

## 2016-07-07 NOTE — Progress Notes (Signed)
Family requesting to talk to MD about patient's code status and plan of care regarding extubation. Dr. Vivi Ferns notified, orders to wait to extubate until tomorrow AM so family can discuss plan of care with Dr. Criselda Peaches. Family notified of plan and agrees.

## 2016-07-07 NOTE — Progress Notes (Addendum)
Brett Becker  Admission Date: 2016-07-15             ICU Daily Progress Note: 07/07/2016     55 y/o with male admitted with COPD exacerbation, respiratory failure and etoh/ cocaine abuse. On bipap but required intubation 7/19 for worsening hypoxia. While intubated required bronch with mucus plugs removed.His daughter spoke with PC and he is now a DNR.  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   Still on 45 % fiO2 and 10 of PEEP    Current Facility-Administered Medications   Medication Dose Route Frequency   ??? metoclopramide HCl (REGLAN) injection 5 mg  5 mg IntraVENous Q6H PRN   ??? sodium chloride (NS) flush 20 mL  20 mL InterCATHeter Q8H   ??? heparin (porcine) pf 600 Units  600 Units InterCATHeter Q8H   ??? sodium chloride (NS) flush 20 mL  20 mL InterCATHeter PRN   ??? heparin (porcine) pf 600 Units  600 Units InterCATHeter PRN   ??? metoprolol (LOPRESSOR) injection 5 mg  5 mg IntraVENous Q6H PRN   ??? methylPREDNISolone (PF) (SOLU-MEDROL) injection 40 mg  40 mg IntraVENous Q12H   ??? amLODIPine (NORVASC) tablet 5 mg  5 mg Oral DAILY   ??? famotidine (PF) (PEPCID) 20 mg in sodium chloride 0.9 % 10 mL injection  20 mg IntraVENous Q24H   ??? lidocaine (XYLOCAINE) 4 % (40 mg/mL) topical solution   Topical PRN   ??? albuterol (PROVENTIL VENTOLIN) nebulizer solution 2.5 mg  2.5 mg Nebulization Q4H RT   ??? budesonide (PULMICORT) 500 mcg/2 ml nebulizer suspension  500 mcg Nebulization BID RT   ??? fentaNYL in normal saline (pf) 25 mcg/mL infusion  25-200 mcg/hr IntraVENous TITRATE   ??? midazolam (VERSED) 100 mg in 0.9% sodium chloride 100 mL infusion  1-10 mg/hr IntraVENous TITRATE   ??? insulin regular (NOVOLIN R, HUMULIN R) injection   SubCUTAneous Q6H   ??? NUTRITIONAL SUPPORT ELECTROLYTE PRN ORDERS   Does Not Apply PRN   ??? LORazepam (ATIVAN) injection 0.5 mg  0.5 mg IntraVENous Q3H PRN   ??? metoprolol (LOPRESSOR) injection 5 mg  5 mg IntraVENous Q4H PRN   ??? sodium chloride (NS) flush 5-10 mL  5-10 mL IntraVENous Q8H    ??? sodium chloride (NS) flush 5-10 mL  5-10 mL IntraVENous PRN   ??? nicotine (NICODERM CQ) 14 mg/24 hr patch 1 Patch  1 Patch TransDERmal DAILY         Objective:     Vitals:    07/07/16 0900 07/07/16 0930 07/07/16 0945 07/07/16 1129   BP: (!) 164/102  (!) 164/102 (!) 169/102   Pulse: (!) 114 (!) 116 (!) 117 (!) 106   Resp: 28 16     Temp:       SpO2: 97% 98%     Weight:       Height:         Intake and Output:   07/24 1901 - 07/26 0700  In: 3159.6 [I.V.:670.6]  Out: 4125 [Urine:3725; Drains:400]  07/26 0701 - 07/26 1900  In: 45   Out: -     Physical Exam:          GEN: acutely ill,   HEENT: ??eyes closed  NECK: ??no JVD, no retractions, no thyromegaly or masses,   LUNGS:  Few rhonchi on vent  HEART: ??RRR with no M,G,R;  ABDOMEN: ??soft; positive bowel sounds present, + diarrhea  EXTREMITIES: ??warm with no cyanosis, 1-2+  edema  SKIN: ??no jaundice or ecchymosis   NEURO: ??alert  on vent   ??  LINES: ETT, IV, rectal tube, foley  DRIPS: fentanyl, versed  NUTRITION:tube feedings      Ventilator Settings  Mode FIO2 Rate Tidal Volume Pressure PEEP   Pressure support  45 %    400 ml  5 cm H2O  10 cm H20      Peak airway pressure: 15.4 cm H2O   Minute ventilation: 5.1 l/min       CHEST XRAY:       LAB  Recent Labs      07/07/16   0405  07/06/16   0415  07/05/16   0400   WBC  12.0*  9.9  8.8   HGB  8.6*  9.0*  9.2*   HCT  26.3*  27.5*  27.8*   PLT  111*  116*  121*     Recent Labs      07/07/16   0405  07/06/16   0415  07/05/16   0400   NA  150*  150*  147*   K  4.5  4.1  3.9   CL  107  108*  107   CO2  38*  36*  31   GLU  195*  185*  230*   BUN  54*  50*  53*   CREA  1.30  1.28  1.30   MG  1.9   --   2.1   PHOS  4.1   --   3.8     Recent Labs      07/07/16   0305  07/06/16   0310  07/05/16   0328   PH  7.46*  7.45  7.49*   PCO2  54*  44  38   PO2  73*  74*  80   HCO3  38*  30*  28*       Cultures:  Culture result: SCANT CANDIDA KRUSEI (A) ??     Echocardiogram  SUMMARY:     - ??Left ventricle: Systolic function was mildly to moderately reduced.   Ejection fraction was estimated in the range of 35 % to 40 %. There was akinesis of   The mid inferoseptal, mid anterolateral, apical septal, apical lateral, and   Apical wall(s).    - ??Right ventricle: There was severe pulmonary artery hypertension.    - ??Left atrium: The atrium was mildly dilated.    - ??Right atrium: The atrium was mildly dilated.    - ??Aortic valve: There was mild to moderate regurgitation.    - ??Mitral valve: There was mild regurgitation.    - ??Tricuspid valve: There was mild regurgitation.    Assessment:     Patient Active Problem List   Diagnosis Code   ??? 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 Alcohol abuse F10.10   ??? HTN (hypertension) I10   ??? Gastric ulcer K25.9   ??? Hepatitis C B19.20   ??? 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 Hyponatremia E87.1   ??? COPD exacerbation (HCC) J44.1   ??? Hypoxia R09.02   ??? Underweight R63.6   ??? Lactic acidosis E87.2   ??? ETOH abuse F10.10   ??? Acute hypoxemic respiratory failure (HCC) J96.01   ??? Protein calorie malnutrition (HCC) E46   ??? Elevated troponin R74.8   ??? AKI (acute kidney injury) (HCC) N17.9   ??? Thrombocytopenia (HCC)  D69.6   ??? CAP (community acquired pneumonia) J18.9   ??? Acute systolic heart failure (HCC) I50.21   ??? Acute deep vein thrombosis (DVT) of right upper extremity [facility]) I82.621       Plan     Hospital Problems  Date Reviewed: 07/02/16          Codes Class Noted POA    Acute deep vein thrombosis (DVT) of right upper extremity (HCC) ICD-10-CM: W46.659  ICD-9-CM: 453.82  07/03/2016 Yes    IMPRESSION: Nonocclusive thrombus in the right forearm cephalic vein, central  right subclavian vein, and right brachiocephalic vein    Acute systolic heart failure (HCC) ICD-10-CM: I50.21  ICD-9-CM: 428.21  07/02/2016 Yes    Ad lasix    CAP (community acquired pneumonia) ICD-10-CM: J18.9  ICD-9-CM: 486  06/29/2016 Yes    S/p abx     COPD exacerbation (HCC) ICD-10-CM: J44.1  ICD-9-CM: 491.21  2016/07/02 Yes    BD    ETOH abuse (Chronic) ICD-10-CM: F10.10  ICD-9-CM: 305.00  07/02/2016 Yes        * (Principal)Acute hypoxemic respiratory failure (HCC) ICD-10-CM: J96.01  ICD-9-CM: 518.81  07-02-16 Yes        Protein calorie malnutrition (HCC) ICD-10-CM: E46  ICD-9-CM: 263.9  Jul 02, 2016 Yes        Elevated troponin ICD-10-CM: R74.8  ICD-9-CM: 790.6  02-Jul-2016 Yes        AKI (acute kidney injury) (HCC) ICD-10-CM: N17.9  ICD-9-CM: 584.9  2016/07/02 Yes        Thrombocytopenia (HCC) ICD-10-CM: D69.6  ICD-9-CM: 287.5  2016/07/02 Yes        Bullous emphysema- severe (Chronic) ICD-10-CM: J43.9  ICD-9-CM: 492.0  10/15/2013 Yes              -- PC following. 8 days on vent  S/P treatment of PNA with severe bullous emphysema. Trach vs extubate  --wean sedation, on versed and fentanyl   -- solumedrol 40 mg Q 12 hours  -- increase free water   -- control HTN, increase norvasc and add lasix 20 mg daily      Amber Yaakov Guthrie, NP    More than 50% of time documented was spent in face-to-face contact with the patient and in the care of the patient on the floor/unit where the patient is located.   Lungs:  clear  Heart:  RRR with no Murmur/Rubs/Gallops    Additional Comments:  cxr has improved and he is tolerating ps of 5- will do cpap trial    I have spoken with and examined the patient. I agree with the above assessment and plan as documented.    [name], MD

## 2016-07-07 NOTE — Progress Notes (Signed)
Bedside and Verbal shift change report given to Marylene Land, Charity fundraiser (oncoming nurse) by Cammy Copa, RN (offgoing nurse). Report included the following information SBAR, Kardex, ED Summary, Intake/Output, MAR, Recent Results and Cardiac Rhythm ST.     Patient turned after peri/incontinence care performed.  Fentanyl/Versed drips verified via MAR.

## 2016-07-08 ENCOUNTER — Inpatient Hospital Stay: Admit: 2016-07-08 | Payer: MEDICAID | Primary: Family Medicine

## 2016-07-08 LAB — CBC W/O DIFF
HCT: 25.9 % — ABNORMAL LOW (ref 41.1–50.3)
HGB: 8.2 g/dL — ABNORMAL LOW (ref 13.6–17.2)
MCH: 32.2 PG (ref 26.1–32.9)
MCHC: 31.7 g/dL (ref 31.4–35.0)
MCV: 101.6 FL — ABNORMAL HIGH (ref 79.6–97.8)
MPV: 10.7 FL — ABNORMAL LOW (ref 10.8–14.1)
PLATELET: 131 10*3/uL — ABNORMAL LOW (ref 150–450)
RBC: 2.55 M/uL — ABNORMAL LOW (ref 4.23–5.67)
RDW: 14.8 % — ABNORMAL HIGH (ref 11.9–14.6)
WBC: 12.4 10*3/uL — ABNORMAL HIGH (ref 4.3–11.1)

## 2016-07-08 LAB — METABOLIC PANEL, BASIC
Anion gap: 7 mmol/L (ref 7–16)
BUN: 55 MG/DL — ABNORMAL HIGH (ref 6–23)
CO2: 35 mmol/L — ABNORMAL HIGH (ref 21–32)
Calcium: 8.1 MG/DL — ABNORMAL LOW (ref 8.3–10.4)
Chloride: 109 mmol/L — ABNORMAL HIGH (ref 98–107)
Creatinine: 1.21 MG/DL (ref 0.8–1.5)
GFR est AA: >60 mL/min/{1.73_m2} (ref >60)
GFR est non-AA: >60 mL/min/{1.73_m2} (ref >60)
Glucose: 237 mg/dL — ABNORMAL HIGH (ref 65–100)
Potassium: 4.1 mmol/L (ref 3.5–5.1)
Sodium: 151 mmol/L — ABNORMAL HIGH (ref 136–145)

## 2016-07-08 LAB — BLOOD GAS, ARTERIAL
ALLENS TEST: POSITIVE
Arterial O2 Hgb: 92.2 % — ABNORMAL LOW (ref 94.0–97.0)
BASE EXCESS: 4.8 mmol/L — ABNORMAL HIGH (ref 0–3)
BICARBONATE: 28 mmol/L — ABNORMAL HIGH (ref 22.0–26.0)
CARBOXYHEMOGLOBIN: 0.6 % (ref 0.5–1.5)
DEOXYHEMOGLOBIN: 7 % — ABNORMAL HIGH (ref 0.0–5.0)
METHEMOGLOBIN: 0.6 % (ref 0.0–1.5)
O2 SAT: 93 % (ref 92.0–98.5)
PCO2: 38 mmHg (ref 35.0–45.0)
PEEP/CPAP: 8
PO2: 72 mmHg — ABNORMAL LOW (ref 75.0–100.0)
TOTAL HEMOGLOBIN: 10.2 GM/DL — ABNORMAL LOW (ref 11.7–15.0)
pH: 7.49 — ABNORMAL HIGH (ref 7.35–7.45)

## 2016-07-08 LAB — BNP: BNP: 934 pg/mL

## 2016-07-08 LAB — PTT: aPTT: 23.2 s — ABNORMAL LOW (ref 23.5–31.7)

## 2016-07-08 LAB — GLUCOSE, POC
Glucose (POC): 267 mg/dL — ABNORMAL HIGH (ref 65–100)
Glucose (POC): 235 mg/dL — ABNORMAL HIGH (ref 65–100)
Glucose (POC): 265 mg/dL — ABNORMAL HIGH (ref 65–100)
Glucose (POC): 294 mg/dL — ABNORMAL HIGH (ref 65–100)

## 2016-07-08 MED ORDER — HYDRALAZINE 20 MG/ML IJ SOLN
20 mg/mL | INTRAMUSCULAR | Status: AC
Start: 2016-07-08 — End: 2016-07-07
  Administered 2016-07-08: 03:00:00 via INTRAVENOUS

## 2016-07-08 MED ORDER — MORPHINE 10 MG/ML INJ SOLUTION
10 mg/ml | INTRAMUSCULAR | Status: DC | PRN
Start: 2016-07-08 — End: 2016-07-11
  Administered 2016-07-09 – 2016-07-11 (×9): via INTRAVENOUS

## 2016-07-08 MED ORDER — NICARDIPINE 2.5 MG/ML IV
2510 mg/10 mL | INTRAVENOUS | Status: DC
Start: 2016-07-08 — End: 2016-07-09
  Administered 2016-07-08 (×5): via INTRAVENOUS

## 2016-07-08 MED ORDER — HYDRALAZINE 20 MG/ML IJ SOLN
20 mg/mL | Freq: Four times a day (QID) | INTRAMUSCULAR | Status: DC | PRN
Start: 2016-07-08 — End: 2016-07-07
  Administered 2016-07-08: 02:00:00 via INTRAVENOUS

## 2016-07-08 MED ORDER — HYDRALAZINE 20 MG/ML IJ SOLN
20 mg/mL | Freq: Four times a day (QID) | INTRAMUSCULAR | Status: DC | PRN
Start: 2016-07-08 — End: 2016-07-09

## 2016-07-08 MED ORDER — NICARDIPINE IN SODIUM CHLORIDE(ISO-OSMOTIC) 20 MG/200 ML IV PIGGY BACK
INTRAVENOUS | Status: DC
Start: 2016-07-08 — End: 2016-07-08

## 2016-07-08 MED ORDER — MORPHINE 10 MG/ML INJ SOLUTION
10 mg/ml | INTRAMUSCULAR | Status: DC | PRN
Start: 2016-07-08 — End: 2016-07-09
  Administered 2016-07-08: 20:00:00 via INTRAVENOUS

## 2016-07-08 MED ORDER — LORAZEPAM 2 MG/ML IJ SOLN
2 mg/mL | INTRAMUSCULAR | Status: DC | PRN
Start: 2016-07-08 — End: 2016-07-09
  Administered 2016-07-09: 13:00:00 via INTRAVENOUS

## 2016-07-08 MED FILL — ALBUTEROL SULFATE 0.083 % (0.83 MG/ML) SOLN FOR INHALATION: 2.5 mg /3 mL (0.083 %) | RESPIRATORY_TRACT | Qty: 1

## 2016-07-08 MED FILL — NICARDIPINE 2.5 MG/ML IV: 25 mg/10 mL | INTRAVENOUS | Qty: 10

## 2016-07-08 MED FILL — MONOJECT PREFILL ADVANCED (PF) 100 UNIT/ML INTRAVENOUS SYRINGE: 100 unit/mL | INTRAVENOUS | Qty: 6

## 2016-07-08 MED FILL — HYDRALAZINE 20 MG/ML IJ SOLN: 20 mg/mL | INTRAMUSCULAR | Qty: 1

## 2016-07-08 MED FILL — MORPHINE 10 MG/ML IJ SOLN: 10 mg/mL | INTRAMUSCULAR | Qty: 1

## 2016-07-08 MED FILL — LORAZEPAM 2 MG/ML IJ SOLN: 2 mg/mL | INTRAMUSCULAR | Qty: 1

## 2016-07-08 MED FILL — SOLU-MEDROL (PF) 40 MG/ML SOLUTION FOR INJECTION: 40 mg/mL | INTRAMUSCULAR | Qty: 1

## 2016-07-08 MED FILL — BUDESONIDE 0.5 MG/2 ML NEB SUSPENSION: 0.5 mg/2 mL | RESPIRATORY_TRACT | Qty: 1

## 2016-07-08 MED FILL — NICOTINE 14 MG/24 HR DAILY PATCH: 14 mg/24 hr | TRANSDERMAL | Qty: 1

## 2016-07-08 MED FILL — FAMOTIDINE (PF) 20 MG/2 ML IV: 20 mg/2 mL | INTRAVENOUS | Qty: 2

## 2016-07-08 MED FILL — FUROSEMIDE 10 MG/ML IJ SOLN: 10 mg/mL | INTRAMUSCULAR | Qty: 2

## 2016-07-08 MED FILL — AMLODIPINE 10 MG TAB: 10 mg | ORAL | Qty: 1

## 2016-07-08 MED FILL — METOPROLOL TARTRATE 5 MG/5 ML IV SOLN: 5 mg/ mL | INTRAVENOUS | Qty: 5

## 2016-07-08 MED FILL — FENTANYL (PF) 25 MCG/ML IN 0.9 % SODIUM CHLORIDE INJECTION: 25 mcg/mL | INTRAMUSCULAR | Qty: 100

## 2016-07-08 NOTE — Other (Signed)
Interdisciplinary team rounds were held 07/08/2016 with the following team members:Care Management, Nursing, Nurse Practitioner, Nutrition, Palliative Care, Pastoral Care, Pharmacy, Physical Therapy, Physician, Respiratory Therapy and Clinical Coordinator and the patient.    Plan of care discussed. See clinical pathway and/or care plan for interventions and desired outcomes.

## 2016-07-08 NOTE — Progress Notes (Addendum)
Palliative Care Progress Note    Patient: Brett Becker MRN: [id number]  SSN: [id number]    Date of Birth: [birth year]  Age: 55 y.o.  Sex: male       Assessment/Plan:     Chief Complaint/Interval History: remains very weak with intermittent agitation with sedation vacations       Principal Diagnosis:    ?? Debility, Unspecified  R53.81    Additional Diagnoses:   ?? Failure to Thrive  R62.7  ?? Fatigue, Lethargy  R53.83  ?? Frailty  R54  ?? Respiratory Failure, Acute on Chronic  J96.20  ?? Encounter for Palliative Care  Z51.5    Palliative Performance Scale (PPS)  PPS: 30    Medical Decision Making:   Reviewed and summarized notes over last 24 hours   Discussed case with appropriate providers- ICU IDT  Reviewed laboratory and x-ray data- CBC, BMP, ABG    Possible extubation today.  Pulmonary to see.  Available to speak to daughter as needed.  See discussion in yesterday's note.    Addendum:  Returned to speak to [place] a bedside.  She is tearful.  States she just doesn't want to feel like she "gave up" on her father.  Counseled her that she is being very supportive of her father and that she should focus on doing what her father would do if he were able to voice his wishes.     I feel that patient's likelihood of returning to independent function is highly unlikely.    Will discuss findings with members of the interdisciplinary team.         More than 50% of this 25 minute visit was spent counseling and coordination of care as outlined above.    Subjective:     Review of Systems:  Review of systems not obtained due to patient factors- sedated, intubated and ventilated      Objective:     Visit Vitals   ??? BP 167/90   ??? Pulse (!) 121   ??? Temp 98.6 ??F (37 ??C)   ??? Resp 19   ??? Ht 5\' 8"  (1.727 m)   ??? Wt 53.2 kg (117 lb 4.6 oz)   ??? SpO2 94%   ??? BMI 17.83 kg/m2       Physical Exam:    General:   No acute distress.  Thin, frail   Eyes:  Conjunctivae/corneas clear    Nose: Nares normal. Septum midline. DH feeding tube    Neck: Supple, symmetrical, trachea midline   Lungs:   Decreased bilaterally, on pressure support   Heart:  Tachycardic   Abdomen:   non-distended   Extremities: BUE edema   Skin: Scrotal edema   Neurologic: Minimally responsive   Psych: unable     Signed By: Consuello Closs., MD     July 08, 2016

## 2016-07-08 NOTE — Progress Notes (Addendum)
Care Daily Progress Note: 07/08/2016  Admission Date: 06/18/2016     The patient's chart is reviewed and the patient is discussed with the staff. Admitted with severe COPD and respiratory failure requiring intubation. S/p bronch for mucus plugging. On CPAP but short of breath.     Subjective:     Sedated with fentanyl and versed. Not able to nod head or follow any commands.     Current Facility-Administered Medications   Medication Dose Route Frequency   ??? niCARdipine (CARDENE) 25 mg in 0.9% sodium chloride 250 mL infusion  0-15 mg/hr IntraVENous TITRATE   ??? amLODIPine (NORVASC) tablet 10 mg  10 mg Oral DAILY   ??? furosemide (LASIX) injection 20 mg  20 mg IntraVENous DAILY   ??? morphine injection 2 mg  2 mg IntraVENous Q1H PRN   ??? morphine injection 5 mg  5 mg IntraVENous Q1H PRN   ??? hydrALAZINE (APRESOLINE) 20 mg/mL injection 20 mg  20 mg IntraVENous Q6H PRN   ??? metoclopramide HCl (REGLAN) injection 5 mg  5 mg IntraVENous Q6H PRN   ??? sodium chloride (NS) flush 20 mL  20 mL InterCATHeter Q8H   ??? heparin (porcine) pf 600 Units  600 Units InterCATHeter Q8H   ??? sodium chloride (NS) flush 20 mL  20 mL InterCATHeter PRN   ??? heparin (porcine) pf 600 Units  600 Units InterCATHeter PRN   ??? metoprolol (LOPRESSOR) injection 5 mg  5 mg IntraVENous Q6H PRN   ??? methylPREDNISolone (PF) (SOLU-MEDROL) injection 40 mg  40 mg IntraVENous Q12H   ??? famotidine (PF) (PEPCID) 20 mg in sodium chloride 0.9 % 10 mL injection  20 mg IntraVENous Q24H   ??? lidocaine (XYLOCAINE) 4 % (40 mg/mL) topical solution   Topical PRN   ??? albuterol (PROVENTIL VENTOLIN) nebulizer solution 2.5 mg  2.5 mg Nebulization Q4H RT   ??? budesonide (PULMICORT) 500 mcg/2 ml nebulizer suspension  500 mcg Nebulization BID RT   ??? fentaNYL in normal saline (pf) 25 mcg/mL infusion  25-200 mcg/hr IntraVENous TITRATE   ??? midazolam (VERSED) 100 mg in 0.9% sodium chloride 100 mL infusion  1-10 mg/hr IntraVENous TITRATE    ??? insulin regular (NOVOLIN R, HUMULIN R) injection   SubCUTAneous Q6H   ??? NUTRITIONAL SUPPORT ELECTROLYTE PRN ORDERS   Does Not Apply PRN   ??? LORazepam (ATIVAN) injection 0.5 mg  0.5 mg IntraVENous Q3H PRN   ??? metoprolol (LOPRESSOR) injection 5 mg  5 mg IntraVENous Q4H PRN   ??? sodium chloride (NS) flush 5-10 mL  5-10 mL IntraVENous Q8H   ??? sodium chloride (NS) flush 5-10 mL  5-10 mL IntraVENous PRN   ??? nicotine (NICODERM CQ) 14 mg/24 hr patch 1 Patch  1 Patch TransDERmal DAILY       Objective:     Vitals:    07/08/16 1030 07/08/16 1045 07/08/16 1100 07/08/16 1115   BP: 165/88 168/89 170/86 170/86   Pulse: (!) 112 (!) 114 (!) 115 (!) 111   Resp: 20 19 17 20    Temp:       SpO2: 92% 92% 92% 92%   Weight:       Height:           Intake and Output:   07/25 1901 - 07/27 0700  In: 2730 [I.V.:297]  Out: 3170 [Urine:3075; Drains:95]       Physical Exam:          Constitutional:  intubated and mechanically ventilated.  HEENT:  Sclera clear,  pupils equal, orally intubated  RESPIRATORY: clear bilaterally. Overall decreased and noted tachypnea.   CARDIOVASCULAR:  RRR with no M,G,R; sinus tach  ABDOMEN/GI:  soft with no tenderness; positive bowel sounds present  EXTREMITIES:  warm with no cyanosis, 1+ lower leg edema and 2+ arm edema  SKIN:  no jaundice or ecchymosis  NEURO: eyes open and appear focused. Can't follow any commands and makes not attempt to communicate    MUSCULOSKELETAL:  No deformities    ROS: cannot assess    LINES:  ETT, foley, NG,     DRIPS:  Cardene, Fentanyl, versed    CHEST XRAY:        Ventilator Settings  Mode FIO2 Rate Tidal Volume Pressure PEEP   Pressure support  35 % 22 Around 370 mls 5 cm H2O  8 cm H20      Peak airway pressure: 14.6 cm H2O   Minute ventilation: 8.1 l/min     ABG:   Recent Labs      07/08/16   0455  07/07/16   1800  07/07/16   0305   PH  7.49*  7.51*  7.46*   PCO2  38  47*  54*   PO2  72*  61*  73*   HCO3  28*  37*  38*        LAB  Recent Labs      07/08/16   0439  07/07/16    0405  07/06/16   0415   WBC  12.4*  12.0*  9.9   HGB  8.2*  8.6*  9.0*   HCT  25.9*  26.3*  27.5*   PLT  131*  111*  116*     Recent Labs      07/08/16   0439  07/07/16   0405  07/06/16   0415   NA  151*  150*  150*   K  4.1  4.5  4.1   CL  109*  107  108*   CO2  35*  38*  36*   GLU  237*  195*  185*   BUN  55*  54*  50*   CREA  1.21  1.30  1.28   MG   --   1.9   --    PHOS   --   4.1   --    CA  8.1*  7.5*  7.5*           Assessment:  (Medical Decision Making)     Hospital Problems  Date Reviewed: 07/04/2016          Codes Class Noted POA    Hypernatremia ICD-10-CM: E87.0  ICD-9-CM: 276.0  07/08/2016 No    New. Only getting 25 mls free water    Acute deep vein thrombosis (DVT) of right upper extremity (HCC) ICD-10-CM: Z61.096  ICD-9-CM: 453.82  07/03/2016 Yes    Overview Signed 07/07/2016 11:41 AM by Aram Candela, NP     IMPRESSION: Nonocclusive thrombus in the right forearm cephalic vein, central right subclavian vein, and right brachiocephalic vein         No anticoagulation    Acute systolic heart failure (HCC) ICD-10-CM: I50.21  ICD-9-CM: 428.21  07/02/2016 Yes    Has a lot of edema. EF 35 to 40%    CAP (community acquired pneumonia) ICD-10-CM: J18.9  ICD-9-CM: 486  06/29/2016 Yes    Has completed abx. Candida per bronch washings    COPD exacerbation (HCC) ICD-10-CM: J44.1  ICD-9-CM: 491.21  07/03/2016 Yes    No wheezing at present. On steroids/bronchodilators    ETOH abuse (Chronic) ICD-10-CM: F10.10  ICD-9-CM: 305.00  06/20/2016 Yes    Noted - hx of DTs per chart    * (Principal)Acute hypoxemic respiratory failure (HCC) ICD-10-CM: J96.01  ICD-9-CM: 518.81  06/21/2016 Yes    On PS of 5 but looks short of breath with mild tachypnea    Protein calorie malnutrition (HCC) (Chronic) ICD-10-CM: E46  ICD-9-CM: 263.9  11-Jul-2016 Yes    On TF now.   Lab Results   Component Value Date/Time    Protein, total 4.9 07/04/2016 03:30 AM    Albumin 1.8 07/04/2016 03:30 AM        Elevated troponin ICD-10-CM: R74.8   ICD-9-CM: 790.6  06/23/2016 Yes        AKI (acute kidney injury) (HCC) ICD-10-CM: N17.9  ICD-9-CM: 584.9  07/09/2016 Yes    improved    Thrombocytopenia (HCC) (Chronic) ICD-10-CM: D69.6  ICD-9-CM: 287.5  07/05/2016 Yes    better    Bullous emphysema- severe (Chronic) ICD-10-CM: J43.9  ICD-9-CM: 492.0  10/15/2013 Yes    Looks too short of breath to successfully extubate          Plan:  (Medical Decision Making)   1. Currently on PS but looks short of breath. Don't think he would tolerate extubation - concerned about fatigue. Discussed with daughter - she is leaning towards terminal extubation but she worries about dyspnea.   2. If aggressive care continued, need to increase water, add anticoagulant for DVT, diurese, consider ACE for heart failure, etc  3. If care continued, will need trach/PEG, regency consult    Terisa Starr, NP    More than 50% of time documented was spent face-to-face contact with the patient and in the care of the patient on the floor/unit where the patient is located  Lungs:   rhonchi  Heart:  RRR with no Murmur/Rubs/Gallops    Additional Comments:  tachypneic on cpap,  Not following commands-his daughter is not comfortable making decision about withholding care, not eligible for regency due to insurance per social service    I have spoken with and examined the patient. I agree with the above assessment and plan as documented.    [name], MD

## 2016-07-08 NOTE — Progress Notes (Signed)
Ventilator check complete; patient has a #7.5 ET tube secured at the 22 at the lip. Patient is not able to follow commands.  Breath sounds are coarse and diminished.  Trachea is midline, Negative for subcutaneous air, and chest excursion is symmetric. Patient is also Negative for cyanosis and is Positive for pitting edema.  All alarms are set and audible.  Resuscitation bag is at the head of the bed.      Ventilator Settings  Mode FIO2 Rate Tidal Volume Pressure PEEP I:E Ratio   Pressure support  35 %     5 cm H2O  8 cm H20  1:2.00      Peak airway pressure: 14.6 cm H2O   Minute ventilation: 8.1 l/min     ABG:   Recent Labs      07/08/16   0455  07/07/16   1800  07/07/16   0305   PH  7.49*  7.51*  7.46*   PCO2  38  47*  54*   PO2  72*  61*  73*   HCO3  28*  37*  38*         Stephanie A Vanderbrink, RT

## 2016-07-08 NOTE — Progress Notes (Signed)
Patient breathing more agonal and tachypneic. Patient placed back on PRVC, settings documented in flowsheet. Patient appears to be more comfortable now and family is at the bedside.

## 2016-07-08 NOTE — Progress Notes (Signed)
Bedside and Verbal shift change report given to Ladona Ridgel, RN (oncoming nurse) by Marylene Land, RN (offgoing nurse). Report included the following information SBAR, Kardex, ED Summary, Intake/Output, MAR, Recent Results and Cardiac Rhythm ST.     Patient turned during incontinence/peri care for comfort.  Fentanyl/Versed drips verified via MAR.

## 2016-07-08 NOTE — Progress Notes (Signed)
Patient O2sat dropped to 80% and trying to sit up in bed. RT called to bedside, patient suctioned and adjustments made to ventilator per RT.

## 2016-07-08 NOTE — Progress Notes (Signed)
Patient breathing more agonal, HR 120s. Stephanie, RT at bedside and placed patient back on a rate for comfort. Patient tolerating well. Will monitor.

## 2016-07-08 NOTE — Progress Notes (Signed)
Patient SpO2 dropped to 80%. Patient suctioned for a small amount of thick white secretions. Peep increased to 10 and FiO2 to 100%. SpO2 now 93%.

## 2016-07-08 NOTE — Progress Notes (Signed)
LOC application with [facility] started via [facility] for any potential rehab needs, confirmation # [id number].

## 2016-07-08 NOTE — Progress Notes (Signed)
Dr. Vivi Ferns notified of patient's SBP sustaining above 190 after prn Lopressor given. Orders received for Cardene drip to titrate for SBP <180 and DBP <100.  Readback for clarification.

## 2016-07-09 ENCOUNTER — Inpatient Hospital Stay: Admit: 2016-07-09 | Payer: MEDICAID | Primary: Family Medicine

## 2016-07-09 LAB — BLOOD GAS, ARTERIAL
Arterial O2 Hgb: 88.5 % — ABNORMAL LOW (ref 94.0–97.0)
BASE EXCESS: 2.9 mmol/L (ref 0–3)
BICARBONATE: 26 mmol/L (ref 22.0–26.0)
CARBOXYHEMOGLOBIN: 0.5 % (ref 0.5–1.5)
DEOXYHEMOGLOBIN: 10 % — ABNORMAL HIGH (ref 0.0–5.0)
FIO2: 40 %
METHEMOGLOBIN: 0.7 % (ref 0.0–1.5)
O2 SAT: 90 % — ABNORMAL LOW (ref 92.0–98.5)
PCO2: 33 mmHg — ABNORMAL LOW (ref 35.0–45.0)
PEEP/CPAP: 10
PO2: 58 mmHg — ABNORMAL LOW (ref 75.0–100.0)
TOTAL HEMOGLOBIN: 10.6 GM/DL — ABNORMAL LOW (ref 11.7–15.0)
Tidal volume: 500
pH: 7.51 — ABNORMAL HIGH (ref 7.35–7.45)

## 2016-07-09 LAB — FUNGUS CULTURE AND SMEAR

## 2016-07-09 LAB — FUNGUS, CULTURE, MISC SOURCE: Fungus culture: POSITIVE — AB

## 2016-07-09 LAB — PTT: aPTT: 22.9 s — ABNORMAL LOW (ref 23.5–31.7)

## 2016-07-09 LAB — PHOSPHORUS: Phosphorus: 4.5 MG/DL (ref 2.5–4.5)

## 2016-07-09 LAB — GLUCOSE, POC
Glucose (POC): 231 mg/dL — ABNORMAL HIGH (ref 65–100)
Glucose (POC): 327 mg/dL — ABNORMAL HIGH (ref 65–100)

## 2016-07-09 LAB — MAGNESIUM: Magnesium: 2.2 mg/dL (ref 1.8–2.4)

## 2016-07-09 MED ORDER — MORPHINE 10 MG/ML INJ SOLUTION
10 mg/ml | INTRAMUSCULAR | Status: DC | PRN
Start: 2016-07-09 — End: 2016-07-11
  Administered 2016-07-09 – 2016-07-10 (×6): via INTRAVENOUS

## 2016-07-09 MED ORDER — LORAZEPAM 2 MG/ML IJ SOLN
2 mg/mL | INTRAMUSCULAR | Status: DC | PRN
Start: 2016-07-09 — End: 2016-07-11
  Administered 2016-07-09 – 2016-07-11 (×5): via INTRAVENOUS

## 2016-07-09 MED ORDER — SODIUM CHLORIDE 0.9% BOLUS IV
0.9 % | Freq: Once | INTRAVENOUS | Status: AC
Start: 2016-07-09 — End: 2016-07-09
  Administered 2016-07-09: 14:00:00 via INTRAVENOUS

## 2016-07-09 MED ORDER — GLYCOPYRROLATE 0.2 MG/ML IJ SOLN
0.2 mg/mL | Freq: Four times a day (QID) | INTRAMUSCULAR | Status: DC | PRN
Start: 2016-07-09 — End: 2016-07-11
  Administered 2016-07-09 – 2016-07-11 (×3): via INTRAVENOUS

## 2016-07-09 MED ORDER — LORAZEPAM 2 MG/ML IJ SOLN
2 mg/mL | INTRAMUSCULAR | Status: AC
Start: 2016-07-09 — End: 2016-07-09
  Administered 2016-07-09: 18:00:00 via INTRAVENOUS

## 2016-07-09 MED FILL — FUROSEMIDE 10 MG/ML IJ SOLN: 10 mg/mL | INTRAMUSCULAR | Qty: 2

## 2016-07-09 MED FILL — NICOTINE 14 MG/24 HR DAILY PATCH: 14 mg/24 hr | TRANSDERMAL | Qty: 1

## 2016-07-09 MED FILL — ALBUTEROL SULFATE 0.083 % (0.83 MG/ML) SOLN FOR INHALATION: 2.5 mg /3 mL (0.083 %) | RESPIRATORY_TRACT | Qty: 1

## 2016-07-09 MED FILL — BUDESONIDE 0.5 MG/2 ML NEB SUSPENSION: 0.5 mg/2 mL | RESPIRATORY_TRACT | Qty: 1

## 2016-07-09 MED FILL — GLYCOPYRROLATE 0.2 MG/ML IJ SOLN: 0.2 mg/mL | INTRAMUSCULAR | Qty: 2

## 2016-07-09 MED FILL — FAMOTIDINE (PF) 20 MG/2 ML IV: 20 mg/2 mL | INTRAVENOUS | Qty: 2

## 2016-07-09 MED FILL — MORPHINE 10 MG/ML IJ SOLN: 10 mg/mL | INTRAMUSCULAR | Qty: 1

## 2016-07-09 MED FILL — SOLU-MEDROL (PF) 40 MG/ML SOLUTION FOR INJECTION: 40 mg/mL | INTRAMUSCULAR | Qty: 1

## 2016-07-09 MED FILL — MONOJECT PREFILL ADVANCED (PF) 100 UNIT/ML INTRAVENOUS SYRINGE: 100 unit/mL | INTRAVENOUS | Qty: 6

## 2016-07-09 MED FILL — LORAZEPAM 2 MG/ML IJ SOLN: 2 mg/mL | INTRAMUSCULAR | Qty: 1

## 2016-07-09 MED FILL — AMLODIPINE 10 MG TAB: 10 mg | ORAL | Qty: 1

## 2016-07-09 MED FILL — NICARDIPINE 2.5 MG/ML IV: 25 mg/10 mL | INTRAVENOUS | Qty: 10

## 2016-07-09 NOTE — Progress Notes (Signed)
Care Daily Progress Note: 07/09/2016  Admission Date: 07/08/2016     The patient's chart is reviewed and the patient is discussed with the staff. Admitted with severe COPD and respiratory failure requiring intubation. S/p bronch for mucus plugging. On CPAP initially and required intubation on 06/30/2016 and continued to be ventilator dependent.    Subjective:     Sedated with fentanyl and versed. Not able to nod head or follow any commands.   Now with worsening hypotension and tachycardia- required fluid bolus- did not meet extubation criteria yesterday.  Long discussion with family regarding patient status and wishes, after the discussion family decided on comfort measures only and compassionate extubation.    Current Facility-Administered Medications   Medication Dose Route Frequency   ??? niCARdipine (CARDENE) 25 mg in 0.9% sodium chloride 250 mL infusion  0-15 mg/hr IntraVENous TITRATE   ??? morphine injection 5 mg  5 mg IntraVENous Q15MIN PRN   ??? morphine injection 10 mg  10 mg IntraVENous Q15MIN PRN   ??? LORazepam (ATIVAN) injection 2 mg  2 mg IntraVENous Q2H PRN   ??? amLODIPine (NORVASC) tablet 10 mg  10 mg Oral DAILY   ??? furosemide (LASIX) injection 20 mg  20 mg IntraVENous DAILY   ??? hydrALAZINE (APRESOLINE) 20 mg/mL injection 20 mg  20 mg IntraVENous Q6H PRN   ??? metoclopramide HCl (REGLAN) injection 5 mg  5 mg IntraVENous Q6H PRN   ??? sodium chloride (NS) flush 20 mL  20 mL InterCATHeter Q8H   ??? heparin (porcine) pf 600 Units  600 Units InterCATHeter Q8H   ??? sodium chloride (NS) flush 20 mL  20 mL InterCATHeter PRN   ??? heparin (porcine) pf 600 Units  600 Units InterCATHeter PRN   ??? metoprolol (LOPRESSOR) injection 5 mg  5 mg IntraVENous Q6H PRN   ??? methylPREDNISolone (PF) (SOLU-MEDROL) injection 40 mg  40 mg IntraVENous Q12H   ??? famotidine (PF) (PEPCID) 20 mg in sodium chloride 0.9 % 10 mL injection  20 mg IntraVENous Q24H   ??? lidocaine (XYLOCAINE) 4 % (40 mg/mL) topical solution   Topical PRN    ??? albuterol (PROVENTIL VENTOLIN) nebulizer solution 2.5 mg  2.5 mg Nebulization Q4H RT   ??? budesonide (PULMICORT) 500 mcg/2 ml nebulizer suspension  500 mcg Nebulization BID RT   ??? fentaNYL in normal saline (pf) 25 mcg/mL infusion  25-200 mcg/hr IntraVENous TITRATE   ??? midazolam (VERSED) 100 mg in 0.9% sodium chloride 100 mL infusion  1-10 mg/hr IntraVENous TITRATE   ??? insulin regular (NOVOLIN R, HUMULIN R) injection   SubCUTAneous Q6H   ??? NUTRITIONAL SUPPORT ELECTROLYTE PRN ORDERS   Does Not Apply PRN   ??? metoprolol (LOPRESSOR) injection 5 mg  5 mg IntraVENous Q4H PRN   ??? sodium chloride (NS) flush 5-10 mL  5-10 mL IntraVENous Q8H   ??? sodium chloride (NS) flush 5-10 mL  5-10 mL IntraVENous PRN   ??? nicotine (NICODERM CQ) 14 mg/24 hr patch 1 Patch  1 Patch TransDERmal DAILY       Objective:     Vitals:    07/09/16 0700 07/09/16 0730 07/09/16 0800 07/09/16 0802   BP: 106/74 101/59 91/61    Pulse: (!) 119 (!) 117 (!) 115 (!) 119   Resp: 18 18 16 16    Temp:   99.5 ??F (37.5 ??C)    SpO2: 100% 100% 100% 100%   Weight:       Height:  Intake and Output:   07/26 1901 - 07/28 0700  In: 2454.1 [I.V.:612.1]  Out: 2670 [Urine:2625; Drains:45]       Physical Exam:          Constitutional:  intubated and mechanically ventilated.  HEENT:  Sclera clear, pupils equal, orally intubated  RESPIRATORY: clear bilaterally. Overall decreased and noted tachypnea.   CARDIOVASCULAR:  RRR with no M,G,R; sinus tach  ABDOMEN/GI:  soft with no tenderness; positive bowel sounds present  EXTREMITIES:  warm with no cyanosis, 1+ lower leg edema and 2+ arm edema- generalizeed anasarca,  SKIN:  no jaundice   NEURO: eyes open and appear focused. Can't follow any commands and makes not attempt to communicate    MUSCULOSKELETAL:  No deformities    ROS: cannot assess    LINES:  ETT, foley, NG,     DRIPS:  Cardene, Fentanyl, versed    CHEST XRAY:        Ventilator Settings  Mode FIO2 Rate Tidal Volume Pressure PEEP    PRVC  50 % 22 Around 370 mls 5 cm H2O  10 cm H20      Peak airway pressure: 27.9 cm H2O   Minute ventilation: 8.4 l/min     ABG:   Recent Labs      07/09/16   0315  07/08/16   0455  07/07/16   1800   PH  7.51*  7.49*  7.51*   PCO2  33*  38  47*   PO2  58*  72*  61*   HCO3  26  28*  37*        LAB  Recent Labs      07/08/16   0439  07/07/16   0405   WBC  12.4*  12.0*   HGB  8.2*  8.6*   HCT  25.9*  26.3*   PLT  131*  111*     Recent Labs      07/09/16   0330  07/08/16   0439  07/07/16   0405   NA   --   151*  150*   K   --   4.1  4.5   CL   --   109*  107   CO2   --   35*  38*   GLU   --   237*  195*   BUN   --   55*  54*   CREA   --   1.21  1.30   MG  2.2   --   1.9   PHOS  4.5   --   4.1   CA   --   8.1*  7.5*           Assessment:  (Medical Decision Making)        Patient Active Problem List   Diagnosis Code   ??? Alcohol abuse F10.10   ??? HTN (hypertension) I10   ??? Hepatitis C B19.20   ??? 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 COPD exacerbation (HCC) J44.1   ??? Underweight R63.6   ??? ETOH abuse F10.10   ??? Acute hypoxemic respiratory failure (HCC) J96.01   ??? Protein calorie malnutrition (HCC) E46   ??? Elevated troponin R74.8   ??? AKI (acute kidney injury) (HCC) N17.9   ??? Thrombocytopenia (HCC) D69.6   ??? CAP (community acquired pneumonia) J18.9   ??? Acute systolic heart failure (HCC) I50.21   ??? Acute deep vein  thrombosis (DVT) of right upper extremity (HCC) I82.621   ??? Hypernatremia E87.0         Plan:  (Medical Decision Making)     Hospital Problems  Date Reviewed: 06/29/16          Codes Class Noted POA    Hypernatremia ICD-10-CM: E87.0  ICD-9-CM: 276.0  07/08/2016 No    New. Only getting 25 mls free water    Acute deep vein thrombosis (DVT) of right upper extremity (HCC) ICD-10-CM: K82.060  ICD-9-CM: 453.82  07/03/2016 Yes    Overview Signed 07/07/2016 11:41 AM by Aram Candela, NP     IMPRESSION: Nonocclusive thrombus in the right forearm cephalic vein,  central right subclavian vein, and right brachiocephalic vein         No anticoagulation    Acute systolic heart failure (HCC) ICD-10-CM: I50.21  ICD-9-CM: 428.21  07/02/2016 Yes    Has a lot of edema. EF 35 to 40%    CAP (community acquired pneumonia) ICD-10-CM: J18.9  ICD-9-CM: 486  06/29/2016 Yes    Has completed abx. Candida per bronch washings    COPD exacerbation (HCC) ICD-10-CM: J44.1  ICD-9-CM: 491.21  June 29, 2016 Yes    No wheezing at present. On steroids/bronchodilators    ETOH abuse (Chronic) ICD-10-CM: F10.10  ICD-9-CM: 305.00  2016-06-29 Yes    Noted - hx of DTs per chart    * (Principal)Acute hypoxemic respiratory failure (HCC) ICD-10-CM: J96.01  ICD-9-CM: 518.81  2016-06-29 Yes    On PS of 5 but looks short of breath with mild tachypnea and FiO2 increasing to 50%- not ready for weaning but as noted in HPI family no longer want to continue futile care and wish comfort measures for the patient.  Will stop all meds except morphine and ativan and proceed with compassionate extubation with patient DNR.  Not interested in either tracheostomy or PEG    Protein calorie malnutrition (HCC) (Chronic) ICD-10-CM: E46  ICD-9-CM: 263.9  06/29/16 Yes    On TF now.   Lab Results   Component Value Date/Time    Protein, total 4.9 07/04/2016 03:30 AM    Albumin 1.8 07/04/2016 03:30 AM        Elevated troponin ICD-10-CM: R74.8  ICD-9-CM: 790.6  2016-06-29 Yes        AKI (acute kidney injury) (HCC) ICD-10-CM: N17.9  ICD-9-CM: 584.9  Jun 29, 2016 Yes    improved    Thrombocytopenia (HCC) (Chronic) ICD-10-CM: D69.6  ICD-9-CM: 287.5  06/29/16 Yes    better    Bullous emphysema- severe (Chronic) ICD-10-CM: J43.9  ICD-9-CM: 492.0  10/15/2013 Yes    Looks too short of breath to successfully extubate          Palliative care informed.    Earley Brooke, MD    More than 50% of time documented was spent face-to-face contact with the patient and in the care of the patient on the floor/unit where the patient is located

## 2016-07-09 NOTE — Progress Notes (Signed)
Staff requested support to the family. They decided to place patient on comfort care. Prayer and scripture reading were provided.      [employer]

## 2016-07-09 NOTE — Other (Signed)
Interdisciplinary team rounds were held 07/09/2016 with the following team members:Care Management, Nursing, Nurse Practitioner, Nutrition, Palliative Care, Pastoral Care, Pharmacy, Physician, Respiratory Therapy and Clinical Coordinator and the patient and child(ren).    Plan of care discussed. See clinical pathway and/or care plan for interventions and desired outcomes.

## 2016-07-09 NOTE — Progress Notes (Signed)
Pt having tremors and appearing anxious.  2mg  ativan adminsitered for anxiety.  Will continue to monitor.

## 2016-07-09 NOTE — Progress Notes (Signed)
Ventilator check complete; patient has a #7.5 ET tube secured at the 23 at the lip.  Patient is sedated.  Patient is not able to follow commands.  Breath sounds are diminished.  Trachea is midline, Negative for subcutaneous air, and chest excursion is symmetric. Patient is also Negative for cyanosis.  All alarms are set and audible.  Resuscitation bag is at the head of the bed.      Ventilator Settings  Mode FIO2 Rate Tidal Volume Pressure PEEP I:E Ratio   PRVC  50 %   16 500 ml    10 cm H20  1:3.13      Peak airway pressure: 27.9 cm H2O   Minute ventilation: 8.4 l/min     ABG:   Recent Labs      07/09/16   0315  07/08/16   0455  07/07/16   1800   PH  7.51*  7.49*  7.51*   PCO2  33*  38  47*   PO2  58*  72*  61*   HCO3  26  28*  37*         Sarah S Sojourner, RT

## 2016-07-09 NOTE — Progress Notes (Signed)
Discussed patient's marginal BPs, tachycardia, low grade fevers, and trending WBC with Dr. Vivi Ferns. Order received for 500 ml fluid bolus. Will administer and monitor for effect.

## 2016-07-09 NOTE — Progress Notes (Signed)
Chart reviewed. Pt's family now decided comfort measures. CM available for any needs.

## 2016-07-09 NOTE — Progress Notes (Signed)
Problem: Nutrition Deficit  Goal: *Optimize nutritional status  Nutrition F/U:  Acknowledge the family's desire to focus on the patient's comfort. He was extubated to NC and his FT was removed at the same time. He remains NPO. RD respectfully signing off.  Bettey Mare. Daphine Deutscher RD,LD,CNSC  [phone]

## 2016-07-09 NOTE — Progress Notes (Signed)
Pt continues with dyspnea.  Christine RN administered 6 mg morphine.  Will continue to monitor.

## 2016-07-09 NOTE — Progress Notes (Signed)
Pt extubated to 2L NC per family wishes for comfort measures.

## 2016-07-09 NOTE — Progress Notes (Addendum)
Palliative Care Progress Note    Patient: Brett Becker MRN: [id number]  SSN: [id number]    Date of Birth: Aug 15, 1961  Age: 55 y.o.  Sex: male       Assessment/Plan:     Chief Complaint/Interval History: Sedated and lethargic this morning       Principal Diagnosis:    ?? Debility, Unspecified  R53.81    Additional Diagnoses:   ?? Failure to Thrive  R62.7  ?? Fatigue, Lethargy  R53.83  ?? Frailty  R54  ?? Respiratory Failure, Acute on Chronic  J96.20  ?? Counseling, Encounter for Medical Advice  Z71.9  ?? Encounter for Palliative Care  Z51.5    Palliative Performance Scale (PPS)  PPS: 30    Medical Decision Making:   Reviewed and summarized notes over last 24 hours   Discussed case with appropriate providers: ICU IDT; Lytle Michaels, primary RN  Reviewed laboratory and x-ray data- CBC, BMP, ABG    Patient intubated, ventilated, and sedated.  He is on Fentanyl and Versed drips, and has just received Ativan for tachypnea.  Patient with increased oxygen requirements this morning.  Overall, patient has not made any progress.  Daughter, Velva Harman, is on her way to the hospital.  Will discuss with her when she arrives.    93- Met with patient's daughter, Velva Harman, her mother and cousin at the bedside along with Dr. Burt Knack and [place].  After update on patient's condition, Velva Harman would like to focus exclusively on patient's comfort.  Provided emotional support and orders adjusted.    1400- Present at bedside during extubation.  Patient appears comfortable.  Patient with shallow, irregular respirations.  Family brought back to bedside.    Will discuss findings with members of the interdisciplinary team.         More than 50% of this 35 minute visit was spent counseling and coordination of care as outlined above.    Subjective:     Review of Systems:  Review of systems not obtained due to patient factors: sedated, intubated and ventilated      Objective:     Visit Vitals   ??? BP 91/61 (BP Patient Position: At rest)   ??? Pulse (!) 119    ??? Temp 99.5 ??F (37.5 ??C)   ??? Resp 16   ??? Ht '5\' 8"'  (1.727 m)   ??? Wt 57.3 kg (126 lb 5.2 oz)   ??? SpO2 100%   ??? BMI 19.21 kg/m2       Physical Exam:    General:   No acute distress.  Thin, frail, cachectic.    Eyes:  Conjunctivae/corneas clear    Nose: Nares normal. Septum midline. DH feeding tube   Neck: Supple, symmetrical, trachea midline   Lungs:   Decreased bilaterally, on PRVC this morning.   Heart:  Tachycardic   Abdomen:   Soft, non-distended   Extremities: BUE edema   Skin: Scrotal edema   Neurologic: Minimally responsive   Psych: Unable to assess     Signed By: Renne Crigler, NP     July 09, 2016

## 2016-07-09 NOTE — Progress Notes (Signed)
Per patient's daugter, they are currently waiting on patient's sister. Will keep fentanyl and versed gtts going until time of extubation.

## 2016-07-09 NOTE — Progress Notes (Signed)
Pt appearing dyspneic and having mild tremors in the bed.  6mg  morphine administered for dyspnea.  Will continue to monitor pt comfort.

## 2016-07-09 NOTE — Progress Notes (Signed)
Bedside and Verbal shift change report given to Thurston Pounds RN (oncoming nurse) by Erin Hearing (offgoing nurse). Report included the following information SBAR, Kardex, Procedure Summary, Intake/Output, MAR, Recent Results and Cardiac Rhythm S. Tach.     Gtt rates verified.

## 2016-07-10 MED FILL — MORPHINE 10 MG/ML IJ SOLN: 10 mg/mL | INTRAMUSCULAR | Qty: 1

## 2016-07-10 MED FILL — GLYCOPYRROLATE 0.2 MG/ML IJ SOLN: 0.2 mg/mL | INTRAMUSCULAR | Qty: 2

## 2016-07-10 MED FILL — LORAZEPAM 2 MG/ML IJ SOLN: 2 mg/mL | INTRAMUSCULAR | Qty: 1

## 2016-07-10 NOTE — Progress Notes (Signed)
Focused assessment completed r/t patient being comfort measures only. Patient with shallow, even respirations. Eyes open to voice. Incomprehensible speech. Upper extremities remain swollen. NSR on monitor. Continues to require PRN doses of morphine and ativan for comfort. Patient repositioned.

## 2016-07-10 NOTE — Progress Notes (Signed)
Repositioned patient for comfort.

## 2016-07-10 NOTE — Progress Notes (Signed)
Moderate amount of oral secretions.  0.4mg  robinul admin and 6 mg morphine admin for dyspnea.  Oral secretions attempted to be suctioned out and face and beard cleaned of dried secretions.

## 2016-07-10 NOTE — Progress Notes (Signed)
Care Daily Progress Note: 07/10/2016  Admission Date: 17-Jul-2016     The patient's chart is reviewed and the patient is discussed with the staff. Admitted with severe COPD and respiratory failure requiring intubation. S/p bronch for mucus plugging. On CPAP initially and required intubation on 06/30/2016 and continued to be ventilator dependent.  Comfort care measures implemented 07/09/16 and patient was successfully extubated.    Subjective:   No overnight events - seems comfortable.      Current Facility-Administered Medications   Medication Dose Route Frequency   ??? glycopyrrolate (ROBINUL) injection 0.4 mg  0.4 mg IntraVENous Q6H PRN   ??? morphine injection 6 mg  6 mg IntraVENous Q15MIN PRN   ??? LORazepam (ATIVAN) injection 2 mg  2 mg IntraVENous Q2H PRN   ??? morphine injection 10 mg  10 mg IntraVENous Q15MIN PRN   ??? sodium chloride (NS) flush 20 mL  20 mL InterCATHeter Q8H   ??? sodium chloride (NS) flush 20 mL  20 mL InterCATHeter PRN   ??? sodium chloride (NS) flush 5-10 mL  5-10 mL IntraVENous Q8H   ??? sodium chloride (NS) flush 5-10 mL  5-10 mL IntraVENous PRN       Objective:     Vitals:    07/09/16 1930 07/09/16 1945 07/09/16 1951 07/10/16 0354   BP: 162/86 157/87 159/90 143/77   Pulse: 83 83 (!) 101 82   Resp: 9 9 13 8    Temp:       SpO2: 100% 100% 97% 98%   Weight:       Height:           Intake and Output:   07/27 1901 - 07/29 0700  In: 1055.8 [I.V.:55.8]  Out: 300 [Urine:300]       Physical Exam:          Constitutional: extubated on O2 via NC- comfortable  HEENT:  Sclera clear, pupils equal, orally intubated  RESPIRATORY: clear bilaterally. Overall decreased    CARDIOVASCULAR:  RRR with no M,G,R; sinus tach  ABDOMEN/GI:  soft with no tenderness; positive bowel sounds present  EXTREMITIES:  warm with no cyanosis, 1+ lower leg edema and 2+ arm edema- generalizeed anasarca,  SKIN:  no jaundice   NEURO: eyes open and appear focused. Can't follow any commands and makes not attempt to communicate     MUSCULOSKELETAL:  No deformities    ROS: cannot assess    LINES:  ETT, foley, NG,     DRIPS:  Cardene, Fentanyl, versed    CHEST XRAY:          Recent Labs      07/09/16   0315  07/08/16   0455  07/07/16   1800   PH  7.51*  7.49*  7.51*   PCO2  33*  38  47*   PO2  58*  72*  61*   HCO3  26  28*  37*        LAB  Recent Labs      07/08/16   0439   WBC  12.4*   HGB  8.2*   HCT  25.9*   PLT  131*     Recent Labs      07/09/16   0330  07/08/16   0439   NA   --   151*   K   --   4.1   CL   --   109*   CO2   --   35*   GLU   --  237*   BUN   --   55*   CREA   --   1.21   MG  2.2   --    PHOS  4.5   --    CA   --   8.1*           Assessment:  (Medical Decision Making)        Patient Active Problem List   Diagnosis Code   ??? Alcohol abuse F10.10   ??? HTN (hypertension) I10   ??? Hepatitis C B19.20   ??? 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 COPD exacerbation (HCC) J44.1   ??? Underweight R63.6   ??? ETOH abuse F10.10   ??? Acute hypoxemic respiratory failure (HCC) J96.01   ??? Protein calorie malnutrition (HCC) E46   ??? Elevated troponin R74.8   ??? AKI (acute kidney injury) (HCC) N17.9   ??? Thrombocytopenia (HCC) D69.6   ??? CAP (community acquired pneumonia) J18.9   ??? Acute systolic heart failure (HCC) I50.21   ??? Acute deep vein thrombosis (DVT) of right upper extremity (HCC) I82.621   ??? Hypernatremia E87.0         Plan:  (Medical Decision Making)     Hospital Problems  Date Reviewed: 07/08/2016          Codes Class Noted POA    Hypernatremia ICD-10-CM: E87.0  ICD-9-CM: 276.0  07/08/2016 No    New. Only getting 25 mls free water    Acute deep vein thrombosis (DVT) of right upper extremity (HCC) ICD-10-CM: Z61.096  ICD-9-CM: 453.82  07/03/2016 Yes    Overview Signed 07/07/2016 11:41 AM by Aram Candela, NP     IMPRESSION: Nonocclusive thrombus in the right forearm cephalic vein, central right subclavian vein, and right brachiocephalic vein         No anticoagulation    Acute systolic heart failure (HCC) ICD-10-CM: I50.21   ICD-9-CM: 428.21  07/02/2016 Yes    Has a lot of edema. EF 35 to 40%    CAP (community acquired pneumonia) ICD-10-CM: J18.9  ICD-9-CM: 486  06/29/2016 Yes    Has completed abx. Candida per bronch washings    COPD exacerbation (HCC) ICD-10-CM: J44.1  ICD-9-CM: 491.21  July 08, 2016 Yes    No wheezing at present. On steroids/bronchodilators    ETOH abuse (Chronic) ICD-10-CM: F10.10  ICD-9-CM: 305.00  2016/07/08 Yes    Noted - hx of DTs per chart    * (Principal)Acute hypoxemic respiratory failure [facility]) ICD-10-CM: J96.01  ICD-9-CM: 518.81  08-Jul-2016 Yes    Family no longer wanted to continue futile care and comfort measures for the patient were implementad yesterday.  Extubated uneventfully yesterday   DNR.  Seems comfortable    Protein calorie malnutrition (HCC) (Chronic) ICD-10-CM: E46  ICD-9-CM: 263.9  2016-07-08 Yes    On TF now.   Lab Results   Component Value Date/Time    Protein, total 4.9 07/04/2016 03:30 AM    Albumin 1.8 07/04/2016 03:30 AM        Elevated troponin ICD-10-CM: R74.8  ICD-9-CM: 790.6  2016-07-08 Yes        AKI (acute kidney injury) (HCC) ICD-10-CM: N17.9  ICD-9-CM: 584.9  07/08/2016 Yes    improved    Thrombocytopenia (HCC) (Chronic) ICD-10-CM: D69.6  ICD-9-CM: 287.5  2016/07/08 Yes    better    Bullous emphysema- severe (Chronic) ICD-10-CM: J43.9  ICD-9-CM: 492.0  10/15/2013 Yes  Palliative care informed.  Transfer to floor     Earley Brooke, MD    More than 50% of time documented was spent face-to-face contact with the patient and in the care of the patient on the floor/unit where the patient is located.

## 2016-07-10 NOTE — Progress Notes (Signed)
Patient given bed bath. Linen, gown, and bed pad changed. Repositioned for comfort.

## 2016-07-11 MED FILL — MORPHINE 10 MG/ML IJ SOLN: 10 mg/mL | INTRAMUSCULAR | Qty: 1

## 2016-07-11 MED FILL — GLYCOPYRROLATE 0.2 MG/ML IJ SOLN: 0.2 mg/mL | INTRAMUSCULAR | Qty: 2

## 2016-07-11 MED FILL — LORAZEPAM 2 MG/ML IJ SOLN: 2 mg/mL | INTRAMUSCULAR | Qty: 1

## 2016-07-11 NOTE — Discharge Summary (Addendum)
DISCHARGE NOTE    Brett Becker  Admission date:  07-14-2016  Discharge date:  07/27/2016    Admitting Diagnosis:  Ineffective airway clearance [R06.89]    Discharge Diagnoses:    Hospital Problems  Date Reviewed: July 14, 2016          Codes Class Noted POA    Hypernatremia ICD-10-CM: E87.0  ICD-9-CM: 276.0  07/08/2016 No        Acute deep vein thrombosis (DVT) of right upper extremity (HCC) ICD-10-CM: S14.239  ICD-9-CM: 453.82  07/03/2016 Yes    Overview Signed 07/07/2016 11:41 AM by Aram Candela, NP     IMPRESSION: Nonocclusive thrombus in the right forearm cephalic vein, central right subclavian vein, and right brachiocephalic vein             Acute systolic heart failure (HCC) ICD-10-CM: I50.21  ICD-9-CM: 428.21  07/02/2016 Yes        CAP (community acquired pneumonia) ICD-10-CM: J18.9  ICD-9-CM: 486  06/29/2016 Yes        COPD exacerbation (HCC) ICD-10-CM: J44.1  ICD-9-CM: 491.21  2016/07/14 Yes        ETOH abuse (Chronic) ICD-10-CM: F10.10  ICD-9-CM: 305.00  14-Jul-2016 Yes        * (Principal)Acute hypoxemic respiratory failure (HCC) ICD-10-CM: J96.01  ICD-9-CM: 518.81  07-14-16 Yes        Protein calorie malnutrition (HCC) (Chronic) ICD-10-CM: E46  ICD-9-CM: 263.9  2016-07-14 Yes        Elevated troponin ICD-10-CM: R74.8  ICD-9-CM: 790.6  July 14, 2016 Yes        AKI (acute kidney injury) (HCC) ICD-10-CM: N17.9  ICD-9-CM: 584.9  14-Jul-2016 Yes        Thrombocytopenia (HCC) (Chronic) ICD-10-CM: D69.6  ICD-9-CM: 287.5  07/14/2016 Yes        Bullous emphysema- severe (Chronic) ICD-10-CM: J43.9  ICD-9-CM: 492.0  10/15/2013 Yes              Consultants: Palliative care    Studies/Procedures: Intubation/MV, CXRs      Condition on Discharge:  Expired    Disposition:  [facility]orgue      Presenting Illness:     Patient is a 55 y.o. Caucasian male presents with worsening dyspnea.  ??  Patient with COPD with bullous emphysema , active smoker (1PPD), ETOH abuse (about 12 pack per day), was brought in by ambulance since had  distress at home. Per ER attending was bagged in field and then put on CPAP. Here had to be placed on BIPAP. Noted LA of 9. TNI of 1.2. Patient was not alert or answering questions. Patient with noted EKG changes with T-wave inversion in multiple leads as well. Noted wheezing and multiple electrolyte abnormalities and felts septic as well with possible PNA. Given triple abx, nebs, steroids. Given fluids as well. ER called cardiology to see patient as well.  ??  Patient now on BIPAP at 14/12 50% and Rate of 24 and waking up. He reports was not doing well, but did not elaborate much. Reports does not use oxygen at home but has nebulizer but not sure what is in it. Does not report taking meds for anything else. Also does not report chest pain, but is not clear. Wheezing but not sure if coughing up secretions. Looks disheveled, denies being homeless. Does report drinks 12 pack of beer per day. NA is in 120's. Albumin in 2's. In ARF with of 1.7 and prior <1. Also with thrombocytopenia as well. Given fluids at 30 ml/kg in ER  and repeat LA, TNI being done now.       Hospital course:    The pt was admitted to the ICU and treated with BIPAP, steroids, inhaled BD, and ATB. He did not improve satisfactorily and required intubation on 06/30/16. An echo revealed an LVEF of 35% with WMA and severe PHTN.  He was maintained on the ventilator but his condition did not improve even on the ventilator and he was unable to be extubated. Palliative care was consulted and after multiple days of discussion it was felt he was appropriate for compassionate extubation given his end stage lung disease. He was extubated on 7/28 and maintained on comfort measures. He expired peacefully the morning of 07/06/2016 at 10:32 AM.       Debbra Riding., MD     ADDENDUM    Death certificate completed in [place] application.    Debbra Riding., MD

## 2016-07-11 NOTE — Progress Notes (Signed)
Care Daily Progress Note: 20-Jul-2016  Admission Date: 06/20/2016     The patient's chart is reviewed and the patient is discussed with the staff. Admitted with severe COPD and respiratory failure requiring intubation. S/p bronch for mucus plugging. On CPAP initially and required intubation on 06/30/2016 and continued to be ventilator dependent.  Comfort care measures implemented 07/09/16 and patient was successfully extubated.    Subjective:   Seems comfortable- awaiting bed on floor.        Current Facility-Administered Medications   Medication Dose Route Frequency   ??? glycopyrrolate (ROBINUL) injection 0.4 mg  0.4 mg IntraVENous Q6H PRN   ??? morphine injection 6 mg  6 mg IntraVENous Q15MIN PRN   ??? LORazepam (ATIVAN) injection 2 mg  2 mg IntraVENous Q2H PRN   ??? morphine injection 10 mg  10 mg IntraVENous Q15MIN PRN   ??? sodium chloride (NS) flush 20 mL  20 mL InterCATHeter Q8H   ??? sodium chloride (NS) flush 20 mL  20 mL InterCATHeter PRN   ??? sodium chloride (NS) flush 5-10 mL  5-10 mL IntraVENous Q8H   ??? sodium chloride (NS) flush 5-10 mL  5-10 mL IntraVENous PRN       Objective:     Vitals:    07-20-2016 0440 07/20/2016 0446 07/20/2016 0650 07/20/2016 0731   BP:   164/81    Pulse: 90 84 91    Resp: 13 12 19     Temp:       SpO2: (!) 43% (!) 80% (!) 74% 96%   Weight:       Height:           Intake and Output:   07/28 1901 - 07/30 0700  In: -   Out: 1700 [Urine:1700]       Physical Exam:          Constitutional: extubated on O2 via NC- comfortable  HEENT:  Sclera clear, pupils equal, orally intubated  RESPIRATORY: clear bilaterally. Overall decreased    CARDIOVASCULAR:  RRR with no M,G,R; sinus tach  ABDOMEN/GI:  soft with no tenderness; positive bowel sounds present  EXTREMITIES:  warm with no cyanosis, 1+ lower leg edema and 2+ arm edema- generalizeed anasarca,  SKIN:  no jaundice   NEURO: eyes open and appear focused. Can't follow any commands and makes not attempt to communicate     MUSCULOSKELETAL:  No deformities    ROS: cannot assess    LINES:  ETT, foley, NG,     DRIPS:  Cardene, Fentanyl, versed    CHEST XRAY:          Recent Labs      07/09/16   0315   PH  7.51*   PCO2  33*   PO2  58*   HCO3  26        LAB  No results for input(s): WBC, HGB, HCT, PLT, INR, HGBEXT, HCTEXT, PLTEXT, HGBEXT, HCTEXT, PLTEXT in the last 72 hours.    No lab exists for component: INREXT, INREXT  Recent Labs      07/09/16   0330   MG  2.2   PHOS  4.5           Assessment:  (Medical Decision Making)        Patient Active Problem List   Diagnosis Code   ??? Alcohol abuse F10.10   ??? HTN (hypertension) I10   ??? Hepatitis C B19.20   ??? 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 COPD exacerbation (HCC) J44.1   ??? Underweight R63.6   ??? ETOH abuse F10.10   ??? Acute hypoxemic respiratory failure (HCC) J96.01   ??? Protein calorie malnutrition (HCC) E46   ??? Elevated troponin R74.8   ??? AKI (acute kidney injury) (HCC) N17.9   ??? Thrombocytopenia (HCC) D69.6   ??? CAP (community acquired pneumonia) J18.9   ??? Acute systolic heart failure (HCC) I50.21   ??? Acute deep vein thrombosis (DVT) of right upper extremity (HCC) I82.621   ??? Hypernatremia E87.0         Plan:  (Medical Decision Making)     Hospital Problems  Date Reviewed: 06/26/2016          Codes Class Noted POA    Hypernatremia ICD-10-CM: E87.0  ICD-9-CM: 276.0  07/08/2016 No    New. Only getting 25 mls free water    Acute deep vein thrombosis (DVT) of right upper extremity (HCC) ICD-10-CM: Z61.096  ICD-9-CM: 453.82  07/03/2016 Yes    Overview Signed 07/07/2016 11:41 AM by Aram Candela, NP     IMPRESSION: Nonocclusive thrombus in the right forearm cephalic vein, central right subclavian vein, and right brachiocephalic vein         No anticoagulation    Acute systolic heart failure (HCC) ICD-10-CM: I50.21  ICD-9-CM: 428.21  07/02/2016 Yes    Has a lot of edema. EF 35 to 40%    CAP (community acquired pneumonia) ICD-10-CM: J18.9  ICD-9-CM: 486  06/29/2016 Yes     Has completed abx. Candida per bronch washings    COPD exacerbation (HCC) ICD-10-CM: J44.1  ICD-9-CM: 491.21  07/04/2016 Yes    No wheezing at present.     ETOH abuse (Chronic) ICD-10-CM: F10.10  ICD-9-CM: 305.00  11-Jul-2016 Yes    Noted - hx of DTs per chart    * (Principal)Acute hypoxemic respiratory failure [facility]) ICD-10-CM: J96.01  ICD-9-CM: 518.81  06/26/2016 Yes    Family no longer wanted to continue futile care and comfort measures for the patient were implemented.  Extubated uneventfully 07/09/16  DNR.  Seems comfortable    Protein calorie malnutrition (HCC) (Chronic) ICD-10-CM: E46  ICD-9-CM: 263.9  06/30/2016 Yes    Off TF now.   Lab Results   Component Value Date/Time    Protein, total 4.9 07/04/2016 03:30 AM    Albumin 1.8 07/04/2016 03:30 AM        Elevated troponin ICD-10-CM: R74.8  ICD-9-CM: 790.6  07/07/2016 Yes        AKI (acute kidney injury) (HCC) ICD-10-CM: N17.9  ICD-9-CM: 584.9  July 11, 2016 Yes        Thrombocytopenia (HCC) (Chronic) ICD-10-CM: D69.6  ICD-9-CM: 287.5  11-Jul-2016 Yes        Bullous emphysema- severe (Chronic) ICD-10-CM: J43.9  ICD-9-CM: 492.0  10/15/2013 Yes              Palliative care informed.  Transfer to floor when bed available.    Earley Brooke, MD    More than 50% of time documented was spent face-to-face contact with the patient and in the care of the patient on the floor/unit where the patient is located.

## 2016-07-11 NOTE — Progress Notes (Signed)
Bedside and Verbal shift change report given to Thurston Pounds RN (oncoming nurse) by Delana Meyer RN (offgoing nurse). Report included the following information SBAR, Kardex, Recent Results and Cardiac Rhythm Sinus Rhythm. O2 saturation remains in high 70s.  Thurston Pounds and Family aware of decrease in O2 saturation and slow decompensation overnight.

## 2016-07-11 NOTE — Progress Notes (Signed)
Patients O2 NC at 2L dislodged from his nose and O2 saturation dropped from 84 to 43 in 6 minutes.  Pt dyspneic during desaturation.  NC replaced and O2 saturation slowly increased and dyspnea decreased.  10mg  morphine administered for labored breathing during recovery.  Will continue to monitor pts comfort.

## 2016-07-11 NOTE — Progress Notes (Signed)
Staff notified chaplain of death.  Family left.  Signed by Farrel Gobble, MDiv, chaplain

## 2016-07-11 NOTE — Progress Notes (Signed)
Patient asystole on monitor after increasing labored breathing and decreased LOC this morning. Family at bedside at time of death. Chaplain had just finished visiting with family and patient. Emotional support provided. Dr. Tamsen Roers and nursing supervisor notified of patient death.

## 2016-07-11 NOTE — Progress Notes (Signed)
Pt pronounced dead. Time of death at 10:32 AM.    Debbra Riding., MD

## 2016-07-11 NOTE — Progress Notes (Signed)
Pt taking shallow gasping breaths and appears uncomfortable.  10mg  morphine administered for dyspnea.

## 2016-07-12 LAB — FUNGUS IDENTIFICATION REFLEX

## 2016-07-13 DEATH — deceased

## 2016-08-13 DEATH — deceased

## 2016-08-17 LAB — AFB CULTURE + SMEAR W/RFLX ID FROM CULTURE
Acid Fast Culture: NEGATIVE
Acid Fast Smear: NEGATIVE

## 2020-07-01 NOTE — Progress Notes (Signed)
This encounter was created in error - please disregard.

## 2023-10-28 LAB — PATHOLOGY, SURGICAL

## 2023-10-28 LAB — PATHOLOGY, GENERAL
